# Patient Record
Sex: Female | Born: 1960 | Race: White | Hispanic: No | Smoking: Former smoker
Health system: Southern US, Community
[De-identification: ages and names within clinical notes are randomized; demographics above are authoritative.]

## PROBLEM LIST (undated history)

## (undated) DIAGNOSIS — M1990 Unspecified osteoarthritis, unspecified site: Secondary | ICD-10-CM

## (undated) DIAGNOSIS — T8859XA Other complications of anesthesia, initial encounter: Secondary | ICD-10-CM

## (undated) DIAGNOSIS — T7840XA Allergy, unspecified, initial encounter: Secondary | ICD-10-CM

## (undated) DIAGNOSIS — G43909 Migraine, unspecified, not intractable, without status migrainosus: Secondary | ICD-10-CM

## (undated) DIAGNOSIS — I10 Essential (primary) hypertension: Secondary | ICD-10-CM

## (undated) DIAGNOSIS — K219 Gastro-esophageal reflux disease without esophagitis: Secondary | ICD-10-CM

## (undated) DIAGNOSIS — J449 Chronic obstructive pulmonary disease, unspecified: Secondary | ICD-10-CM

## (undated) HISTORY — DX: Gastro-esophageal reflux disease without esophagitis: K21.9

## (undated) HISTORY — DX: Chronic obstructive pulmonary disease, unspecified: J44.9

## (undated) HISTORY — DX: Unspecified osteoarthritis, unspecified site: M19.90

## (undated) HISTORY — DX: Essential (primary) hypertension: I10

## (undated) HISTORY — PX: UPPER GI ENDOSCOPY: SHX6162

## (undated) HISTORY — DX: Allergy, unspecified, initial encounter: T78.40XA

## (undated) HISTORY — DX: Migraine, unspecified, not intractable, without status migrainosus: G43.909

## (undated) HISTORY — PX: OTHER SURGICAL HISTORY: SHX169

## (undated) MED FILL — Pembrolizumab IV Soln 100 MG/4ML (25 MG/ML): INTRAVENOUS | Qty: 8 | Status: AC

---

## 1984-01-29 HISTORY — PX: OTHER SURGICAL HISTORY: SHX169

## 1984-01-29 HISTORY — PX: TUBAL LIGATION: SHX77

## 1987-01-29 HISTORY — PX: ABDOMINAL HYSTERECTOMY: SHX81

## 2020-01-29 HISTORY — PX: COLONOSCOPY: SHX174

## 2020-10-16 ENCOUNTER — Other Ambulatory Visit: Payer: Self-pay | Admitting: Family

## 2020-10-16 ENCOUNTER — Other Ambulatory Visit (HOSPITAL_COMMUNITY): Payer: Self-pay | Admitting: Family

## 2020-10-16 DIAGNOSIS — F17218 Nicotine dependence, cigarettes, with other nicotine-induced disorders: Secondary | ICD-10-CM

## 2020-10-16 DIAGNOSIS — R918 Other nonspecific abnormal finding of lung field: Secondary | ICD-10-CM

## 2020-11-01 ENCOUNTER — Ambulatory Visit (HOSPITAL_COMMUNITY)
Admission: RE | Admit: 2020-11-01 | Discharge: 2020-11-01 | Disposition: A | Discharge status: Home / Self Care | Payer: Medicare Other | Source: Ambulatory Visit | Attending: Family | Admitting: Family

## 2020-11-01 ENCOUNTER — Other Ambulatory Visit: Payer: Self-pay

## 2020-11-01 DIAGNOSIS — F17218 Nicotine dependence, cigarettes, with other nicotine-induced disorders: Secondary | ICD-10-CM | POA: Insufficient documentation

## 2020-11-01 DIAGNOSIS — R918 Other nonspecific abnormal finding of lung field: Secondary | ICD-10-CM | POA: Diagnosis not present

## 2020-11-01 LAB — GLUCOSE, CAPILLARY: Glucose-Capillary: 103 mg/dL — ABNORMAL HIGH (ref 70–99)

## 2020-11-01 MED ORDER — FLUDEOXYGLUCOSE F - 18 (FDG) INJECTION
10.0000 | Freq: Once | INTRAVENOUS | Status: AC
  Administered 2020-11-01: 6.51 via INTRAVENOUS

## 2020-11-13 ENCOUNTER — Telehealth: Payer: Self-pay | Admitting: Hematology and Oncology

## 2020-11-13 NOTE — Telephone Encounter (Signed)
Scheduled appt per 10/11 referral. Pt is aware of appt date and time.

## 2020-11-20 ENCOUNTER — Other Ambulatory Visit: Payer: Self-pay

## 2020-11-20 DIAGNOSIS — E349 Endocrine disorder, unspecified: Secondary | ICD-10-CM

## 2020-11-20 HISTORY — DX: Endocrine disorder, unspecified: E34.9

## 2020-11-21 ENCOUNTER — Encounter: Payer: Self-pay | Admitting: Hematology and Oncology

## 2020-11-21 ENCOUNTER — Inpatient Hospital Stay: Payer: Medicare Other | Attending: Hematology and Oncology | Admitting: Hematology and Oncology

## 2020-11-21 VITALS — BP 134/84 | HR 78 | Temp 98.8°F | Resp 18 | Ht 61.0 in | Wt 129.0 lb

## 2020-11-21 DIAGNOSIS — J449 Chronic obstructive pulmonary disease, unspecified: Secondary | ICD-10-CM

## 2020-11-21 DIAGNOSIS — F17210 Nicotine dependence, cigarettes, uncomplicated: Secondary | ICD-10-CM | POA: Diagnosis not present

## 2020-11-21 DIAGNOSIS — R911 Solitary pulmonary nodule: Secondary | ICD-10-CM

## 2020-11-21 DIAGNOSIS — R531 Weakness: Secondary | ICD-10-CM | POA: Diagnosis not present

## 2020-11-21 HISTORY — DX: Solitary pulmonary nodule: R91.1

## 2020-11-21 NOTE — Assessment & Plan Note (Addendum)
60 year old female current smoker with new 12.5 mm PET positive lung nodule. She reports increasing weakness which led her to her PCP for evaluation. CT imaging revealed lung nodules that were evaluated by PET scan and one nodule was identified to be hypermetabolic. There is no evidence of metastatic disease. We reviewed her PET in detail. She has a history of COPD as well as smoking 5-8 cigarettes per day. She is agreeable to biopsy and plan for treatment. We will refer her to [facility] for biopsy. She will return to clinic once pathology has been obtained to review results and treatment plan with Medical Oncology.

## 2020-11-21 NOTE — Progress Notes (Signed)
Patient Care Team: Madison Hickman, FNP as PCP - General (Family Medicine)  Clinic Day:  11/22/2020  Referring physician: Madison Hickman, FNP  ASSESSMENT & PLAN:   Assessment & Plan: Nodule of left lung 60 year old female current smoker with new 12.5 mm PET positive lung nodule. She reports increasing weakness which led her to her PCP for evaluation. CT imaging revealed lung nodules that were evaluated by PET scan and one nodule was identified to be hypermetabolic. There is no evidence of metastatic disease. We reviewed her PET in detail. She has a history of COPD as well as smoking 5-8 cigarettes per day. She is agreeable to biopsy and plan for treatment. We will refer her to [place] for biopsy. She will return to clinic once pathology has been obtained to review results and treatment plan with Medical Oncology.   The patient understands the plans discussed today and is in agreement with them.  She knows to contact our office if she develops concerns prior to her next appointment.    [name], NP  [facility] AT [facility] [address] [place] [address] Dept: [phone] Dept Fax: [phone]   Orders Placed This Encounter  Procedures   Ambulatory referral to Pulmonology    Referral Priority:   Routine    Referral Type:   Consultation    Referral Reason:   Specialty Services Required    Requested Specialty:   Pulmonary Disease    Number of Visits Requested:   1   Ambulatory referral to Genetics    Referral Priority:   Routine    Referral Type:   Consultation    Referral Reason:   Specialty Services Required    Number of Visits Requested:   1      CHIEF COMPLAINT:  CC: A 60 year old female with new 41.9 mm hypermetabolic left lung nodule  Current Treatment:  Diagnositics  INTERVAL HISTORY:  Kamani is here today for repeat clinical assessment. She denies fevers or chills. She denies pain. Her  appetite is good. Her weight has been stable.  I have reviewed the past medical history, past surgical history, social history and family history with the patient and they are unchanged from previous note.  ALLERGIES:  is allergic to codeine, demerol [meperidine hcl], gold bond [menthol-zinc oxide], neosporin [bacitracin-polymyxin b], other, and silver.  MEDICATIONS:  Current Outpatient Medications  Medication Sig Dispense Refill   albuterol (VENTOLIN HFA) 108 (90 Base) MCG/ACT inhaler Inhale 2 puffs into the lungs every 4 (four) hours as needed.     amitriptyline (ELAVIL) 10 MG tablet Take 1 tablet by mouth at bedtime.     hydrochlorothiazide (HYDRODIURIL) 25 MG tablet Take 1 tablet by mouth daily.     acetaminophen (TYLENOL) 500 MG tablet Take 2 tablets by mouth every 6 (six) hours as needed.     aspirin 81 MG EC tablet Take 1 tablet by mouth daily.     b complex vitamins capsule Take 1 capsule by mouth daily.     meloxicam (MOBIC) 7.5 MG tablet Take 1 tablet by mouth in the morning and at bedtime.     No current facility-administered medications for this visit.    HISTORY OF PRESENT ILLNESS:   Oncology History   No history exists.      REVIEW OF SYSTEMS:   Constitutional: Denies fevers, chills or abnormal weight loss Eyes: Denies blurriness of vision Ears, nose, mouth, throat, and face:  Denies mucositis or sore throat Respiratory: Denies cough, dyspnea or wheezes Cardiovascular: Denies palpitation, chest discomfort or lower extremity swelling Gastrointestinal:  Denies nausea, heartburn or change in bowel habits Skin: Denies abnormal skin rashes Lymphatics: Denies new lymphadenopathy or easy bruising Neurological:Denies numbness, tingling or new weaknesses Behavioral/Psych: Mood is stable, no new changes  All other systems were reviewed with the patient and are negative.   VITALS:  Blood pressure 134/84, pulse 78, temperature 98.8 F (37.1 C), temperature source Oral,  resp. rate 18, height 5\' 1"  (1.549 m), weight 129 lb (58.5 kg), SpO2 97 %.  Wt Readings from Last 3 Encounters:  11/21/20 129 lb (58.5 kg)  11/01/20 130 lb (59 kg)    Body mass index is 24.37 kg/m.  Performance status (ECOG): 1 - Symptomatic but completely ambulatory  PHYSICAL EXAM:   GENERAL:alert, no distress and comfortable SKIN: skin color, texture, turgor are normal, no rashes or significant lesions EYES: normal, Conjunctiva are pink and non-injected, sclera clear OROPHARYNX:no exudate, no erythema and lips, buccal mucosa, and tongue normal  NECK: supple, thyroid normal size, non-tender, without nodularity LYMPH:  no palpable lymphadenopathy in the cervical, axillary or inguinal LUNGS: clear to auscultation and percussion with normal breathing effort HEART: regular rate & rhythm and no murmurs and no lower extremity edema ABDOMEN:abdomen soft, non-tender and normal bowel sounds Musculoskeletal:no cyanosis of digits and no clubbing  NEURO: alert & oriented x 3 with fluent speech, no focal motor/sensory deficits  LABORATORY DATA:  I have reviewed the data as listed No results found for: NA, K, CL, CO2, GLUCOSE, BUN, CREATININE, CALCIUM, PROT, ALBUMIN, AST, ALT, ALKPHOS, BILITOT, GFRNONAA, GFRAA  No results found for: SPEP, UPEP  No results found for: WBC, NEUTROABS, HGB, HCT, MCV, PLT    Chemistry   No results found for: NA, K, CL, CO2, BUN, CREATININE, GLU No results found for: CALCIUM, ALKPHOS, AST, ALT, BILITOT     RADIOGRAPHIC STUDIES: I have personally reviewed the radiological images as listed and agreed with the findings in the report. NM PET Image Initial (PI) Skull Base To Thigh (F-18 FDG)  Result Date: 11/02/2020 CLINICAL DATA:  Initial treatment strategy for left lower lobe pulmonary nodule on lung cancer screening CT scan. EXAM: NUCLEAR MEDICINE PET SKULL BASE TO THIGH TECHNIQUE: 6.51 mCi F-18 FDG was injected intravenously. Full-ring PET imaging was performed  from the skull base to thigh after the radiotracer. CT data was obtained and used for attenuation correction and anatomic localization. Fasting blood glucose: 103 mg/dl COMPARISON:  Chest CT 10/13/2020 FINDINGS: Mediastinal blood pool activity: SUV max 2.13 Liver activity: SUV max NA NECK: No hypermetabolic lymph nodes in the neck. Incidental CT findings: none CHEST: 12.5 mm lobulated left lower lobe subpleural pulmonary nodule is hypermetabolic with SUV max of 8.11 and consistent with primary lung neoplasm. No enlarged or hypermetabolic mediastinal or hilar lymph nodes to suggest metastatic adenopathy. No supraclavicular or axillary adenopathy. No other pulmonary lesions. Incidental CT findings: Stable underlying emphysematous changes and stable vascular calcifications. ABDOMEN/PELVIS: No abnormal hypermetabolic activity within the liver, pancreas, adrenal glands, or spleen. No hypermetabolic lymph nodes in the abdomen or pelvis. Incidental CT findings: Scattered atherosclerotic calcifications involving the aorta and iliac arteries. SKELETON: No significant bony findings. No evidence of metastatic disease. Incidental CT findings: none IMPRESSION: 1. Solitary 12.5 mm left lower lobe pulmonary nodule is hypermetabolic and consistent with small primary lung neoplasm. 2. No findings for mediastinal or hilar adenopathy or metastatic disease elsewhere. Electronically Signed  By: Marijo Sanes M.D.   On: 11/02/2020 16:53

## 2020-11-22 ENCOUNTER — Encounter: Payer: Self-pay | Admitting: Hematology and Oncology

## 2020-11-22 ENCOUNTER — Telehealth: Payer: Self-pay | Admitting: Genetic Counselor

## 2020-11-22 NOTE — Telephone Encounter (Signed)
Scheduled per sch msg. Called and spoke with patient. Confirmed appt  

## 2020-11-24 ENCOUNTER — Telehealth: Payer: Self-pay | Admitting: Hematology and Oncology

## 2020-11-24 NOTE — Telephone Encounter (Signed)
No LOS Entered 

## 2020-11-28 ENCOUNTER — Encounter: Payer: Self-pay | Admitting: Genetic Counselor

## 2020-11-28 ENCOUNTER — Ambulatory Visit: Payer: Medicare Other

## 2020-11-28 ENCOUNTER — Other Ambulatory Visit: Payer: Self-pay

## 2020-11-28 ENCOUNTER — Inpatient Hospital Stay: Payer: Medicare Other

## 2020-11-28 ENCOUNTER — Inpatient Hospital Stay: Payer: Medicare Other | Attending: Genetic Counselor | Admitting: Genetic Counselor

## 2020-11-28 DIAGNOSIS — Z800 Family history of malignant neoplasm of digestive organs: Secondary | ICD-10-CM

## 2020-11-28 DIAGNOSIS — Z8042 Family history of malignant neoplasm of prostate: Secondary | ICD-10-CM

## 2020-11-28 HISTORY — DX: Family history of malignant neoplasm of digestive organs: Z80.0

## 2020-11-28 HISTORY — DX: Family history of malignant neoplasm of prostate: Z80.42

## 2020-11-28 NOTE — Progress Notes (Signed)
REFERRING PROVIDER: [name], NP [address], [place] [address]  PRIMARY PROVIDER:  Madison Hickman, FNP  PRIMARY REASON FOR VISIT:  1. Family history of pancreatic cancer   2. Family history of prostate cancer   3. Family history of stomach cancer     HISTORY OF PRESENT ILLNESS:   Sydney Hunt, a 60 y.o. female, was seen for a [place] cancer genetics consultation at the request of Dr. Conard Novak due to a family history of cancer.  Sydney Hunt presents to clinic today to discuss the possibility of a hereditary predisposition to cancer, to discuss genetic testing, and to further clarify her future cancer risks, as well as potential cancer risks for family members.   Sydney Hunt is a 60 y.o. female with no personal history of cancer. However, she is in the process of being worked up for possible lung cancer.   CANCER HISTORY:  Oncology History   No history exists.    Past Medical History:  Diagnosis Date   Allergy    Arthritis    COPD (chronic obstructive pulmonary disease) (HCC)    GERD (gastroesophageal reflux disease)    Hypertension    Migraines     Past Surgical History:  Procedure Laterality Date   ABDOMINAL HYSTERECTOMY  1989   unsure of vaginal or abdominal   ovaries removed  [place]    Social History   Socioeconomic History   Marital status: Widowed    Spouse name: Not on file   Number of children: 2   Years of education: Not on file   Highest education level: Not on file  Occupational History   Occupation: unemployed  Tobacco Use   Smoking status: Every Day    Packs/day: 0.25    Years: 58.00    Pack years: 14.50    Types: Cigarettes    Start date: 1963   Smokeless tobacco: Not on file  Vaping Use   Vaping Use: Never used  Substance and Sexual Activity   Alcohol use: Yes    Alcohol/week: 4.0 standard drinks    Types: 1 Glasses of wine, 3 Shots of liquor per week   Drug use: Yes    Frequency: 3.0 times per week     Types: Marijuana    Comment: joints   Sexual activity: Not Currently  Other Topics Concern   Not on file  Social History Narrative   Not on file   Social Determinants of Health   Financial Resource Strain: Not on file  Food Insecurity: Not on file  Transportation Needs: Not on file  Physical Activity: Not on file  Stress: Not on file  Social Connections: Not on file     FAMILY HISTORY:  We obtained a detailed, 4-generation family history.  Significant diagnoses are listed below: Family History  Problem Relation Age of Onset   Pancreatic cancer Maternal Aunt 69   Brain cancer Maternal Uncle    Stomach cancer Maternal Uncle        dx. >50   Prostate cancer Half-Brother    Prostate cancer Half-Brother       Sydney Hunt's maternal half-brother was diagnosed with metastatic prostate cancer in his 16s and died at age 34. She has a second maternal half-brother who was also diagnosed with prostate cancer (possibly stage III) at age 80. Her maternal aunt was diagnosed with pancreatic cancer at age 89. Her maternal uncle was diagnosed with stomach cancer at an unknown  age (>50) and a second maternal uncle was diagnosed with brain cancer at an unknown age. She has 2 maternal aunts who had an unknown type of cancer. All of her maternal aunts/uncles and her mother are deceased.   Sydney Hunt is unaware of previous family history of genetic testing for hereditary cancer risks. There no reported Ashkenazi Jewish ancestry.   GENETIC COUNSELING ASSESSMENT: Sydney Hunt is a 60 y.o. female with a family history of cancer which is somewhat suggestive of a hereditary predisposition to cancer. We, therefore, discussed and recommended the following at today's visit.   DISCUSSION: We discussed that 5 - 10% of cancer is hereditary.  There are many genes that can be associated with hereditary cancer syndromes.  We discussed that testing is beneficial for several reasons, including knowing about other  cancer risks, identifying potential screening and risk-reduction options that may be appropriate, and to understanding if other family members could be at risk for cancer and allowing them to undergo genetic testing.  We reviewed the characteristics, features and inheritance patterns of hereditary cancer syndromes. We also discussed genetic testing, including the appropriate family members to test, the process of testing, insurance coverage and turn-around-time for results. We discussed the implications of a negative, positive, carrier and/or variant of uncertain significant result. We discussed that negative results would be uninformative given that Sydney Hunt does not have a personal history of cancer.   Sydney Hunt was offered a common hereditary cancer panel (47 genes) and an expanded pan-cancer panel (84 genes). Sydney Hunt was informed of the benefits and limitations of each panel, including that expanded pan-cancer panels contain several genes that do not have clear management guidelines at this point in time.  We also discussed that as the number of genes included on a panel increases, the chances of variants of uncertain significance increases.  After considering the benefits and limitations of each gene panel, Sydney Hunt elected to have [facility]'s Multi-Cancer Panel+RNA.   The Multi-Cancer + RNA Panel offered by [facility] includes sequencing and/or deletion/duplication analysis of the following 84 genes:  AIP*, ALK, APC*, ATM*, AXIN2*, BAP1*, BARD1*, BLM*, BMPR1A*, BRCA1*, BRCA2*, BRIP1*, CASR, CDC73*, CDH1*, CDK4, CDKN1B*, CDKN1C*, CDKN2A, CEBPA, CHEK2*, CTNNA1*, DICER1*, DIS3L2*, EGFR, EPCAM, FH*, FLCN*, GATA2*, GPC3, GREM1, HOXB13, HRAS, KIT, MAX*, MEN1*, MET, MITF, MLH1*, MSH2*, MSH3*, MSH6*, MUTYH*, NBN*, NF1*, NF2*, NTHL1*, PALB2*, PDGFRA, PHOX2B, PMS2*, POLD1*, POLE*, POT1*, PRKAR1A*, PTCH1*, PTEN*, RAD50*, RAD51C*, RAD51D*, RB1*, RECQL4, RET, RUNX1*, SDHA*, SDHAF2*, SDHB*, SDHC*, SDHD*, SMAD4*,  SMARCA4*, SMARCB1*, SMARCE1*, STK11*, SUFU*, TERC, TERT, TMEM127*, Tp53*, TSC1*, TSC2*, VHL*, WRN*, and WT1.  RNA analysis is performed for * genes.  Based on Sydney Hunt's family history of cancer, she meets medical criteria for genetic testing. Despite that she meets criteria, she may still have an out of pocket cost. We discussed that if her out of pocket cost for testing is over $100, the laboratory will call and confirm whether she wants to proceed with testing.  If the out of pocket cost of testing is less than $100 she will be billed by the genetic testing laboratory.   We discussed that some people do not want to undergo genetic testing due to fear of genetic discrimination.  A federal law called the Genetic Information Non-Discrimination Act (GINA) of 2008 helps protect individuals against genetic discrimination based on their genetic test results.  It impacts both health insurance and employment.  With health insurance, it protects against increased premiums, being kicked off insurance or being forced to  take a test in order to be insured.  For employment it protects against hiring, firing and promoting decisions based on genetic test results.  GINA does not apply to those in the [employer], those who work for companies with less than 15 employees, and new life insurance or long-term disability insurance policies.  Health status due to a cancer diagnosis is not protected under GINA.  PLAN: After considering the risks, benefits, and limitations, Sydney Hunt provided informed consent to pursue genetic testing and the blood sample was sent to [facility] for analysis of the Multi-Cancer Panel (84 genes). Results should be available within approximately 2-3 weeks' time, at which point they will be disclosed by telephone to Sydney Hunt, as will any additional recommendations warranted by these results. Sydney Hunt will receive a summary of her genetic counseling visit and a copy of her results once  available. This information will also be available in Epic.   Sydney Hunt questions were answered to her satisfaction today. Our contact information was provided should additional questions or concerns arise. Thank you for the referral and allowing [place] to share in the care of your patient.   Lucille Passy, MS, [facility] Genetic Counselor [email or web address] (P) [phone]  The patient was seen for a total of 55 minutes in face-to-face genetic counseling. The patient was seen alone.  Drs. Magrinat, Lindi Adie and/or Burr Medico were available to discuss this case as needed.  _______________________________________________________________________ For Office Staff:  Number of people involved in session: 1 Was an Intern/ student involved with case: yes

## 2020-12-07 ENCOUNTER — Other Ambulatory Visit: Payer: Self-pay

## 2020-12-07 ENCOUNTER — Ambulatory Visit (INDEPENDENT_AMBULATORY_CARE_PROVIDER_SITE_OTHER): Payer: Medicare Other | Admitting: Emergency Medicine

## 2020-12-07 ENCOUNTER — Encounter: Payer: Self-pay | Admitting: Emergency Medicine

## 2020-12-07 ENCOUNTER — Telehealth: Payer: Self-pay | Admitting: Emergency Medicine

## 2020-12-07 DIAGNOSIS — Z720 Tobacco use: Secondary | ICD-10-CM

## 2020-12-07 DIAGNOSIS — R911 Solitary pulmonary nodule: Secondary | ICD-10-CM

## 2020-12-07 HISTORY — DX: Tobacco use: Z72.0

## 2020-12-07 MED ORDER — SPIRIVA RESPIMAT 1.25 MCG/ACT IN AERS: 2.0000 | INHALATION_SPRAY | Freq: Every day | RESPIRATORY_TRACT | 4 refills | Status: DC

## 2020-12-07 MED ORDER — SPIRIVA RESPIMAT 1.25 MCG/ACT IN AERS: 2.0000 | INHALATION_SPRAY | Freq: Every day | RESPIRATORY_TRACT | 0 refills | Status: DC

## 2020-12-07 NOTE — Patient Instructions (Signed)
We reviewed your CT chest and PET scan today.  There is an isolated left-sided pulmonary nodule.  We will perform pulmonary function testing.  Depending on your lung function we will decide whether to consider bronchoscopy with biopsy of your pulmonary nodule versus referral to thoracic surgery to consider surgical removal. We will start Spiriva Respimat, 2 puffs once daily.  If this medication helps your breathing then we will plan to continue it going forward Keep your albuterol available to use 2 puffs when you needed for shortness of breath, chest tightness, wheezing. Follow Dr. Lamonte Sakai next available to review pulmonary function testing.

## 2020-12-07 NOTE — Assessment & Plan Note (Signed)
Hypermetabolic left lower lobe pulmonary nodule consistent with stage I disease.  Depending on the pulmonary function testing I believe she may be a candidate for primary resection.  I will get the PFT now.  If there are concerns about her ability to tolerate surgery then we will arrange for navigational bronchoscopy with biopsy.  I will review her PFT with her and decide which strategy to pursue.

## 2020-12-07 NOTE — Assessment & Plan Note (Signed)
With presumed COPD.  She may have benefited on a trial of respiratory but she had some associated agitation and was unable to continue it.  Cost was also an issue.  I do a trial of Spiriva to see if she can tolerate and whether she benefits.  We will arrange for PFTs as above.  Keep albuterol as needed.  We will need to talk more about smoking cessation going forward.

## 2020-12-07 NOTE — Telephone Encounter (Signed)
ATC pt and confirm appointment at [facility] for PFT? Appt is showing on appt desk for 12/12/20. Nothing further needed at this time.

## 2020-12-07 NOTE — Addendum Note (Signed)
Addended by: Konrad Felix L on: 12/07/2020 03:04 PM   Modules accepted: Orders

## 2020-12-07 NOTE — Progress Notes (Signed)
Subjective:    Patient ID: Sydney Hunt, female    DOB: [birth year], 60 y.o.   MRN: [id number]  HPI 60 year old woman with history of tobacco use (50+ pack years), COPD, allergies, hypertension, GERD.  She went a lung cancer screening CT on 10/13/2020 as below this showed pulmonary nodular disease and prompted PET scan.  She has been seen by oncology and Oval Linsey, is here to discuss the nodular disease, next steps in evaluation. She reports some decreased energy, inability to exert herself. No dyspnea at rest but she does have trouble with exertion, playing with her granddaughter. She has some cough in the am, occasionally productive. She has albuterol, uses about 2-3x a week. She has been tried on [place] before, may have benefited but insurance did not cover it. She also had some tremor/agitation w it.   Lung cancer screening CT chest 10/13/2020 reviewed by me, shows some biapical scarring, scattered emphysema. 11.2 mm left lower lobe pulmonary nodule with some lobulation.  Other smaller nodules largest 5 mm  PET scan 11/01/2020 reviewed by me shows the 12.5 mm lobulated left lower lobe pulmonary nodule is hypermetabolic consistent with primary malignancy.  There are no hypermetabolic mediastinal or hilar lymph nodes, no evidence of any distant disease.   Review of Systems As per HPI  Past Medical History:  Diagnosis Date   Allergy    Arthritis    COPD (chronic obstructive pulmonary disease) (HCC)    GERD (gastroesophageal reflux disease)    Hypertension    Migraines      Family History  Problem Relation Age of Onset   Pancreatic cancer Maternal Aunt 50   Brain cancer Maternal Uncle    Stomach cancer Maternal Uncle        dx. >50   Prostate cancer Half-Brother    Prostate cancer Half-Brother      Social History   Socioeconomic History   Marital status: Widowed    Spouse name: Not on file   Number of children: 2   Years of education: Not on file   Highest education level: Not  on file  Occupational History   Occupation: unemployed  Tobacco Use   Smoking status: Every Day    Packs/day: 0.25    Years: 58.00    Pack years: 14.50    Types: Cigarettes    Start date: 1963   Smokeless tobacco: Not on file   Tobacco comments:    5-10 cigs 12/07/20  Vaping Use   Vaping Use: Never used  Substance and Sexual Activity   Alcohol use: Yes    Alcohol/week: 4.0 standard drinks    Types: 1 Glasses of wine, 3 Shots of liquor per week   Drug use: Yes    Frequency: 3.0 times per week    Types: Marijuana    Comment: joints   Sexual activity: Not Currently  Other Topics Concern   Not on file  Social History Narrative   Not on file   Social Determinants of Health   Financial Resource Strain: Not on file  Food Insecurity: Not on file  Transportation Needs: Not on file  Physical Activity: Not on file  Stress: Not on file  Social Connections: Not on file  Intimate Partner Violence: Not on file    From [place], has lived also in [place] exposure Has worked in [employer], has done cleaning work  Allergies  Allergen Reactions   Codeine Rash   Demerol [Meperidine Hcl] Other (See Comments)    coma  Gold Bond [Menthol-Zinc Oxide] Dermatitis    Skin weeps   Neosporin [Bacitracin-Polymyxin B] Dermatitis    Skin weeps   Other Dermatitis    Stainless steel / skin weeps   Silver Dermatitis    Sterling silver skin weeps     Outpatient Medications Prior to Visit  Medication Sig Dispense Refill   acetaminophen (TYLENOL) 500 MG tablet Take 2 tablets by mouth every 6 (six) hours as needed.     albuterol (VENTOLIN HFA) 108 (90 Base) MCG/ACT inhaler Inhale 2 puffs into the lungs every 4 (four) hours as needed.     amitriptyline (ELAVIL) 10 MG tablet Take 1 tablet by mouth at bedtime.     aspirin 81 MG EC tablet Take 1 tablet by mouth daily.     b complex vitamins capsule Take 1 capsule by mouth daily.     hydrochlorothiazide (HYDRODIURIL) 25 MG tablet Take 1 tablet  by mouth daily.     meloxicam (MOBIC) 7.5 MG tablet Take 1 tablet by mouth in the morning and at bedtime.     No facility-administered medications prior to visit.         Objective:   Physical Exam Vitals:   12/07/20 1355  BP: 104/68  Pulse: 87  SpO2: 95%  Weight: 130 lb 9.6 oz (59.2 kg)  Height: 5\' 1"  (1.549 m)    Gen: Pleasant, well-nourished, in no distress,  normal affect  ENT: No lesions,  mouth clear,  oropharynx clear, no postnasal drip  Neck: No JVD, no stridor  Lungs: No use of accessory muscles, no crackles or wheezing on normal respiration, no wheeze on forced expiration  Cardiovascular: RRR, heart sounds normal, no murmur or gallops, no peripheral edema  Musculoskeletal: No deformities, no cyanosis or clubbing  Neuro: alert, awake, non focal  Skin: Warm, no lesions or rash      Assessment & Plan:  Nodule of left lung Hypermetabolic left lower lobe pulmonary nodule consistent with stage I disease.  Depending on the pulmonary function testing I believe she may be a candidate for primary resection.  I will get the PFT now.  If there are concerns about her ability to tolerate surgery then we will arrange for navigational bronchoscopy with biopsy.  I will review her PFT with her and decide which strategy to pursue.  Tobacco use With presumed COPD.  She may have benefited on a trial of respiratory but she had some associated agitation and was unable to continue it.  Cost was also an issue.  I do a trial of Spiriva to see if she can tolerate and whether she benefits.  We will arrange for PFTs as above.  Keep albuterol as needed.  We will need to talk more about smoking cessation going forward.   Baltazar Apo, MD, PhD 12/07/2020, 2:38 PM [facility] [phone] or if no answer before 7:00PM call [phone] For any issues after 7:00PM please call eLink [phone]

## 2020-12-12 ENCOUNTER — Other Ambulatory Visit: Payer: Self-pay

## 2020-12-12 ENCOUNTER — Ambulatory Visit (HOSPITAL_COMMUNITY)
Admission: RE | Admit: 2020-12-12 | Discharge: 2020-12-12 | Disposition: A | Discharge status: Home / Self Care | Payer: Medicare Other | Source: Ambulatory Visit | Attending: Emergency Medicine | Admitting: Emergency Medicine

## 2020-12-12 DIAGNOSIS — R0600 Dyspnea, unspecified: Secondary | ICD-10-CM | POA: Diagnosis not present

## 2020-12-12 DIAGNOSIS — R911 Solitary pulmonary nodule: Secondary | ICD-10-CM | POA: Diagnosis not present

## 2020-12-12 DIAGNOSIS — F17210 Nicotine dependence, cigarettes, uncomplicated: Secondary | ICD-10-CM | POA: Diagnosis not present

## 2020-12-12 LAB — PULMONARY FUNCTION TEST
DL/VA % pred: 75 %
DL/VA: 3.23 ml/min/mmHg/L
DLCO unc % pred: 67 %
DLCO unc: 12.15 ml/min/mmHg
FEF 25-75 Post: 1.41 L/sec
FEF 25-75 Pre: 1.75 L/sec
FEF2575-%Change-Post: -18 %
FEF2575-%Pred-Post: 67 %
FEF2575-%Pred-Pre: 83 %
FEV1-%Change-Post: -4 %
FEV1-%Pred-Post: 85 %
FEV1-%Pred-Pre: 88 %
FEV1-Post: 1.89 L
FEV1-Pre: 1.98 L
FEV1FVC-%Change-Post: 2 %
FEV1FVC-%Pred-Pre: 98 %
FEV6-%Change-Post: -5 %
FEV6-%Pred-Post: 86 %
FEV6-%Pred-Pre: 91 %
FEV6-Post: 2.41 L
FEV6-Pre: 2.54 L
FEV6FVC-%Change-Post: 0 %
FEV6FVC-%Pred-Post: 103 %
FEV6FVC-%Pred-Pre: 103 %
FVC-%Change-Post: -6 %
FVC-%Pred-Post: 83 %
FVC-%Pred-Pre: 89 %
FVC-Post: 2.41 L
FVC-Pre: 2.58 L
Post FEV1/FVC ratio: 78 %
Post FEV6/FVC ratio: 100 %
Pre FEV1/FVC ratio: 77 %
Pre FEV6/FVC Ratio: 99 %
RV % pred: 105 %
RV: 1.99 L
TLC % pred: 103 %
TLC: 4.76 L

## 2020-12-12 MED ORDER — ALBUTEROL SULFATE (2.5 MG/3ML) 0.083% IN NEBU
2.5000 mg | INHALATION_SOLUTION | Freq: Once | RESPIRATORY_TRACT | Status: AC
Start: 2020-12-12 — End: 2020-12-12
  Administered 2020-12-12: 2.5 mg via RESPIRATORY_TRACT

## 2020-12-15 ENCOUNTER — Encounter: Payer: Self-pay | Admitting: Emergency Medicine

## 2020-12-15 ENCOUNTER — Ambulatory Visit (INDEPENDENT_AMBULATORY_CARE_PROVIDER_SITE_OTHER): Payer: Medicare Other | Admitting: Emergency Medicine

## 2020-12-15 ENCOUNTER — Other Ambulatory Visit: Payer: Self-pay

## 2020-12-15 DIAGNOSIS — J449 Chronic obstructive pulmonary disease, unspecified: Secondary | ICD-10-CM

## 2020-12-15 DIAGNOSIS — R911 Solitary pulmonary nodule: Secondary | ICD-10-CM | POA: Diagnosis not present

## 2020-12-15 DIAGNOSIS — Z720 Tobacco use: Secondary | ICD-10-CM | POA: Diagnosis not present

## 2020-12-15 HISTORY — DX: Chronic obstructive pulmonary disease, unspecified: J44.9

## 2020-12-15 NOTE — Assessment & Plan Note (Signed)
Reviewed PET scan and pulmonary function testing with the patient and her daughter who joined [place] by phone.  Based on the PET appearance, PFT I think she is a good candidate for referral to thoracic surgery for possible primary resection.  We will make the referral today.  She agrees.

## 2020-12-15 NOTE — Addendum Note (Signed)
Addended by: Gavin Potters R on: 12/15/2020 11:29 AM   Modules accepted: Orders

## 2020-12-15 NOTE — Patient Instructions (Addendum)
We will refer you to cardiothoracic surgery to discuss possible surgical removal of your left lower lobe pulmonary nodule. We reviewed your pulmonary function testing today.  These look good without any clear evidence for significant obstructive lung disease. Continue your Spiriva 2 puffs once daily. Keep albuterol available to use 2 puffs if needed for shortness of breath, chest tightness, wheezing. Work hard on decreasing your smoking.  Our goal is for you to stop altogether. Follow with Dr Lamonte Sakai in 3 months or sooner if you have any problems.

## 2020-12-15 NOTE — Progress Notes (Signed)
Subjective:    Patient ID: Sydney Hunt, female    DOB: 1960-11-27, 60 y.o.   MRN: [id number]  HPI 60 year old woman with history of tobacco use (50+ pack years), COPD, allergies, hypertension, GERD.  She went a lung cancer screening CT on 10/13/2020 as below this showed pulmonary nodular disease and prompted PET scan.  She has been seen by oncology and Oval Linsey, is here to discuss the nodular disease, next steps in evaluation. She reports some decreased energy, inability to exert herself. No dyspnea at rest but she does have trouble with exertion, playing with her granddaughter. She has some cough in the am, occasionally productive. She has albuterol, uses about 2-3x a week. She has been tried on [place] before, may have benefited but insurance did not cover it. She also had some tremor/agitation w it.   Lung cancer screening CT chest 10/13/2020 reviewed by me, shows some biapical scarring, scattered emphysema. 11.2 mm left lower lobe pulmonary nodule with some lobulation.  Other smaller nodules largest 5 mm  PET scan 11/01/2020 reviewed by me shows the 12.5 mm lobulated left lower lobe pulmonary nodule is hypermetabolic consistent with primary malignancy.  There are no hypermetabolic mediastinal or hilar lymph nodes, no evidence of any distant disease.  ROV 12/15/20 --this is a follow-up visit for 60 year old woman with a history of tobacco use, COPD, allergic rhinitis, hypertension and GERD.  I saw her a week ago for an abnormal CT scan of the chest done for lung cancer screening that showed an 11 mm left lower lobe pulmonary nodule with some lobulation.  Hypermetabolic on PET scan that was done 11/01/2020.  She underwent pulmonary function testing on 12/12/2020 and is here to review these.  She tells me today that she has had surgery before but that it was hard on her body and that she had bad scarring. No clear contraindication - her allergy list mentions silver and stainless steel but not clear to me  what the sensitivity is.   PFT 12/12/2020 reviewed by me, shows normal airflows without a bronchodilator response, normal lung volumes and a slightly decreased diffusion capacity that does not fully correct when adjusted for her alveolar volume.   Review of Systems As per HPI  Past Medical History:  Diagnosis Date   Allergy    Arthritis    COPD (chronic obstructive pulmonary disease) (HCC)    GERD (gastroesophageal reflux disease)    Hypertension    Migraines      Family History  Problem Relation Age of Onset   Pancreatic cancer Maternal Aunt 50   Brain cancer Maternal Uncle    Stomach cancer Maternal Uncle        dx. >50   Prostate cancer Half-Brother    Prostate cancer Half-Brother      Social History   Socioeconomic History   Marital status: Widowed    Spouse name: Not on file   Number of children: 2   Years of education: Not on file   Highest education level: Not on file  Occupational History   Occupation: unemployed  Tobacco Use   Smoking status: Every Day    Packs/day: 0.25    Years: 58.00    Pack years: 14.50    Types: Cigarettes    Start date: 1963   Smokeless tobacco: Never   Tobacco comments:    10 cigarettes smoked daily [facility]J 12/15/20  Vaping Use   Vaping Use: Never used  Substance and Sexual Activity   Alcohol use: Yes  Alcohol/week: 4.0 standard drinks    Types: 1 Glasses of wine, 3 Shots of liquor per week   Drug use: Yes    Frequency: 3.0 times per week    Types: Marijuana    Comment: joints   Sexual activity: Not Currently  Other Topics Concern   Not on file  Social History Narrative   Not on file   Social Determinants of Health   Financial Resource Strain: Not on file  Food Insecurity: Not on file  Transportation Needs: Not on file  Physical Activity: Not on file  Stress: Not on file  Social Connections: Not on file  Intimate Partner Violence: Not on file    From [place], has lived also in [place] exposure Has worked in  [employer], has done cleaning work  Allergies  Allergen Reactions   Codeine Rash   Demerol [Meperidine Hcl] Other (See Comments)    coma   Gold Bond [Menthol-Zinc Oxide] Dermatitis    Skin weeps   Neosporin [Bacitracin-Polymyxin B] Dermatitis    Skin weeps   Other Dermatitis    Stainless steel / skin weeps   Silver Dermatitis    Sterling silver skin weeps     Outpatient Medications Prior to Visit  Medication Sig Dispense Refill   acetaminophen (TYLENOL) 500 MG tablet Take 2 tablets by mouth every 6 (six) hours as needed.     albuterol (VENTOLIN HFA) 108 (90 Base) MCG/ACT inhaler Inhale 2 puffs into the lungs every 4 (four) hours as needed.     amitriptyline (ELAVIL) 10 MG tablet Take 1 tablet by mouth at bedtime.     aspirin 81 MG EC tablet Take 1 tablet by mouth daily.     b complex vitamins capsule Take 1 capsule by mouth daily.     hydrochlorothiazide (HYDRODIURIL) 25 MG tablet Take 1 tablet by mouth daily.     meloxicam (MOBIC) 7.5 MG tablet Take 1 tablet by mouth in the morning and at bedtime.     Tiotropium Bromide Monohydrate (SPIRIVA RESPIMAT) 1.25 MCG/ACT AERS Inhale 2 puffs into the lungs daily. 4 g 0   No facility-administered medications prior to visit.         Objective:   Physical Exam Vitals:   12/15/20 0933  BP: 132/80  Pulse: 80  Temp: 98.7 F (37.1 C)  TempSrc: Oral  SpO2: 97%  Weight: 132 lb 6.4 oz (60.1 kg)  Height: 5\' 1"  (1.549 m)    Gen: Pleasant, well-nourished, in no distress,  normal affect  ENT: No lesions,  mouth clear,  oropharynx clear, no postnasal drip  Neck: No JVD, no stridor  Lungs: No use of accessory muscles, no crackles or wheezing on normal respiration, no wheeze on forced expiration  Cardiovascular: RRR, heart sounds normal, no murmur or gallops, no peripheral edema  Musculoskeletal: No deformities, no cyanosis or clubbing  Neuro: alert, awake, non focal  Skin: Warm, no lesions or rash      Assessment & Plan:   Tobacco use Discussed cessation with her today.  She has cut down, needs to quit altogether.  She is working on this.  Nodule of left lung Reviewed PET scan and pulmonary function testing with the patient and her daughter who joined [place] by phone.  Based on the PET appearance, PFT I think she is a good candidate for referral to thoracic surgery for possible primary resection.  We will make the referral today.  She agrees.  COPD, mild ([place]) Very functional, minimal limitations.  She does have some cough and sputum production.  She tolerates Spiriva.  Her pulmonary function testing are reassuring without any evidence for significant obstruction.  Mild diffusion defect.  Good candidate for referral to T CTS.  We will continue her Spiriva, albuterol as needed.   Baltazar Apo, MD, PhD 12/15/2020, 9:55 AM [facility] [phone] or if no answer before 7:00PM call [phone] For any issues after 7:00PM please call eLink [phone]

## 2020-12-15 NOTE — Assessment & Plan Note (Signed)
Discussed cessation with her today.  She has cut down, needs to quit altogether.  She is working on this.

## 2020-12-15 NOTE — Assessment & Plan Note (Signed)
Very functional, minimal limitations.  She does have some cough and sputum production.  She tolerates Spiriva.  Her pulmonary function testing are reassuring without any evidence for significant obstruction.  Mild diffusion defect.  Good candidate for referral to T CTS.  We will continue her Spiriva, albuterol as needed.

## 2020-12-18 ENCOUNTER — Telehealth: Payer: Self-pay | Admitting: Genetic Counselor

## 2020-12-18 ENCOUNTER — Encounter: Payer: Self-pay | Admitting: Genetic Counselor

## 2020-12-18 DIAGNOSIS — Z1379 Encounter for other screening for genetic and chromosomal anomalies: Secondary | ICD-10-CM

## 2020-12-18 HISTORY — DX: Encounter for other screening for genetic and chromosomal anomalies: Z13.79

## 2020-12-18 NOTE — Telephone Encounter (Signed)
I contacted [name] to discuss her genetic testing results. No pathogenic variants were identified in the 84 genes analyzed.   The test report has been scanned into EPIC and is located under the Molecular Pathology section of the Results Review tab.  A portion of the result report is included below for reference. Detailed clinic note to follow.  Sydney Passy, MS, [facility] Genetic Counselor [email or web address] (P) [phone]

## 2020-12-20 ENCOUNTER — Institutional Professional Consult (permissible substitution): Payer: Medicare Other | Admitting: Pulmonary Disease

## 2020-12-22 ENCOUNTER — Ambulatory Visit: Payer: Self-pay | Admitting: Genetic Counselor

## 2020-12-22 NOTE — Progress Notes (Signed)
HPI:   Ms. Danser was previously seen in the [facility] clinic due to a family history of cancer and concerns regarding a hereditary predisposition to cancer. Please refer to our prior cancer genetics clinic note for more information regarding our discussion, assessment and recommendations, at the time. Ms. Lei recent genetic test results were disclosed to her, as were recommendations warranted by these results. These results and recommendations are discussed in more detail below.  CANCER HISTORY:  Oncology History   No history exists.    FAMILY HISTORY:  We obtained a detailed, 4-generation family history.  Significant diagnoses are listed below:      Family History  Problem Relation Age of Onset   Pancreatic cancer Maternal Aunt 43   Brain cancer Maternal Uncle     Stomach cancer Maternal Uncle          dx. >50   Prostate cancer Half-Brother     Prostate cancer Half-Brother           Ms. Jann's maternal half-brother was diagnosed with metastatic prostate cancer in his 76s and died at age 87. She has a second maternal half-brother who was also diagnosed with prostate cancer (possibly stage III) at age 17. Her maternal aunt was diagnosed with pancreatic cancer at age 50. Her maternal uncle was diagnosed with stomach cancer at an unknown age (>50) and a second maternal uncle was diagnosed with brain cancer at an unknown age. She has 2 maternal aunts who had an unknown type of cancer. All of her maternal aunts/uncles and her mother are deceased.    Ms. Gwynn is unaware of previous family history of genetic testing for hereditary cancer risks. There no reported Ashkenazi Jewish ancestry.   GENETIC TEST RESULTS:  The [facility] Multi-Cancer Panel found no pathogenic mutations.   The Multi-Cancer + RNA Panel offered by [facility] includes sequencing and/or deletion/duplication analysis of the following 84 genes:  AIP*, ALK, APC*, ATM*, AXIN2*, BAP1*, BARD1*, BLM*, BMPR1A*,  BRCA1*, BRCA2*, BRIP1*, CASR, CDC73*, CDH1*, CDK4, CDKN1B*, CDKN1C*, CDKN2A, CEBPA, CHEK2*, CTNNA1*, DICER1*, DIS3L2*, EGFR, EPCAM, FH*, FLCN*, GATA2*, GPC3, GREM1, HOXB13, HRAS, KIT, MAX*, MEN1*, MET, MITF, MLH1*, MSH2*, MSH3*, MSH6*, MUTYH*, NBN*, NF1*, NF2*, NTHL1*, PALB2*, PDGFRA, PHOX2B, PMS2*, POLD1*, POLE*, POT1*, PRKAR1A*, PTCH1*, PTEN*, RAD50*, RAD51C*, RAD51D*, RB1*, RECQL4, RET, RUNX1*, SDHA*, SDHAF2*, SDHB*, SDHC*, SDHD*, SMAD4*, SMARCA4*, SMARCB1*, SMARCE1*, STK11*, SUFU*, TERC, TERT, TMEM127*, Tp53*, TSC1*, TSC2*, VHL*, WRN*, and WT1.  RNA analysis is performed for * genes.   The test report has been scanned into EPIC and is located under the Molecular Pathology section of the Results Review tab.  A portion of the result report is included below for reference. Genetic testing reported out on 12/12/2020.     Even though a pathogenic variant was not identified, possible explanations for the cancer in the family may include: There may be no hereditary risk for cancer in the family. The cancers in her family may be due to other genetic or environmental factors. There may be a gene mutation in one of these genes that current testing methods cannot detect, but that chance is small. There could be another gene that has not yet been discovered, or that we have not yet tested, that is responsible for the cancer diagnoses in the family.  It is also possible there is a hereditary cause for the cancer in the family that Ms. Monda did not inherit.  Therefore, it is important to remain in touch with cancer genetics in the future  so that we can continue to offer Ms. Fill the most up to date genetic testing.   ADDITIONAL GENETIC TESTING:  We discussed with Ms. Lawler that her genetic testing was fairly extensive.  If there are genes identified to increase cancer risk that can be analyzed in the future, we would be happy to discuss and coordinate this testing at that time.    CANCER SCREENING  RECOMMENDATIONS:  Ms. Stingley test result is considered negative (normal). This means that we have not identified a hereditary cause for her family history of cancer at this time.   An individual's cancer risk and medical management are not determined by genetic test results alone. Overall cancer risk assessment incorporates additional factors, including personal medical history, family history, and any available genetic information that may result in a personalized plan for cancer prevention and surveillance. Therefore, it is recommended she continue to follow the cancer management and screening guidelines provided by her primary healthcare provider.  RECOMMENDATIONS FOR FAMILY MEMBERS:   Since she did not inherit a mutation in a cancer predisposition gene included on this panel, her children could not have inherited a mutation from her in one of these genes. Other members of the family may still carry a pathogenic variant in one of these genes that Ms. Balon did not inherit. Based on the family history, we recommend individuals with a personal history of cancer consider genetic counseling and testing.   FOLLOW-UP:  Cancer genetics is a rapidly advancing field and it is possible that new genetic tests will be appropriate for her and/or her family members in the future. We encouraged her to remain in contact with cancer genetics on an annual basis so we can update her personal and family histories and let her know of advances in cancer genetics that may benefit this family.   Our contact number was provided. Ms. Fill questions were answered to her satisfaction, and she knows she is welcome to call us at anytime with additional questions or concerns.   Lucille Passy, MS, [facility] Genetic Counselor [email or web address] (P) [phone]

## 2020-12-28 ENCOUNTER — Telehealth: Payer: Self-pay | Admitting: Hematology and Oncology

## 2020-12-28 NOTE — Telephone Encounter (Signed)
Patient called requesting an Appt w/[name] to discuss Dr Agustina Caroli recommendations.  Ok per [employer].  Patient scheduled for 12/6 Follow Up 9:30 am

## 2021-01-01 NOTE — Progress Notes (Signed)
[address]       ,[place] [address]             [phone]                    Deloma Clavin [facility] Record #[id number] Date of Birth: [birth year]  Referring: Collene Gobble, MD Primary Care: Madison Hickman, FNP Primary Cardiologist: None  Chief Complaint:    Chief Complaint  Patient presents with   Lung Lesion    Surgical con    History of Present Illness:    Sydney Hunt 60 y.o. female referred by Dr. Lamonte Sakai for surgical evaluation of a 12 mm left lower lobe pulmonary nodule.  She underwent a PET/CT which showed increased uptake with an SUV of 5.6.  She has a significant smoking history and this nodule was found in the lung cancer screening program.  She continues to smoke and states that her smoking has gone up since finding this nodule.  She states that she has no plans to quit smoking.      Smoking Hx: Continues to smoke a pack a day.   Zubrod Score: At the time of surgery this patient's most appropriate activity status/level should be described as: [x]     0    Normal activity, no symptoms []     1    Restricted in physical strenuous activity but ambulatory, able to do out light work []     2    Ambulatory and capable of self care, unable to do work activities, up and about               >50 % of waking hours                              []     3    Only limited self care, in bed greater than 50% of waking hours []     4    Completely disabled, no self care, confined to bed or chair []     5    Moribund   Past Medical History:  Diagnosis Date   Allergy    Arthritis    COPD (chronic obstructive pulmonary disease) ([place])    GERD (gastroesophageal reflux disease)    Hypertension    Migraines     Past Surgical History:  Procedure Laterality Date   ABDOMINAL HYSTERECTOMY  1989   unsure of vaginal or abdominal   ovaries removed  [place]    Family History  Problem Relation Age of Onset   Pancreatic cancer Maternal  Aunt 50   Brain cancer Maternal Uncle    Stomach cancer Maternal Uncle        dx. >50   Prostate cancer Half-Brother    Prostate cancer Half-Brother      Social History   Tobacco Use  Smoking Status Every Day   Packs/day: 0.25   Years: 58.00   Pack years: 14.50   Types: Cigarettes   Start date: 1963  Smokeless Tobacco Never  Tobacco Comments   10 cigarettes smoked daily [facility] 12/15/20    Social History   Substance and Sexual Activity  Alcohol Use Yes   Alcohol/week: 4.0 standard drinks   Types: 1 Glasses of wine, 3 Shots of liquor per week     Allergies  Allergen Reactions   Codeine Rash   Demerol [Meperidine  Hcl] Other (See Comments)    coma   Gold Bond [Menthol-Zinc Oxide] Dermatitis    Skin weeps   Neosporin [Bacitracin-Polymyxin B] Dermatitis    Skin weeps   Other Dermatitis    Stainless steel / skin weeps   Silver Dermatitis    Sterling silver skin weeps    Current Outpatient Medications  Medication Sig Dispense Refill   acetaminophen (TYLENOL) 500 MG tablet Take 2 tablets by mouth every 6 (six) hours as needed.     albuterol (VENTOLIN HFA) 108 (90 Base) MCG/ACT inhaler Inhale 2 puffs into the lungs every 4 (four) hours as needed.     amitriptyline (ELAVIL) 10 MG tablet Take 1 tablet by mouth at bedtime.     aspirin 81 MG EC tablet Take 1 tablet by mouth daily.     b complex vitamins capsule Take 1 capsule by mouth daily.     hydrochlorothiazide (HYDRODIURIL) 25 MG tablet Take 1 tablet by mouth daily.     meloxicam (MOBIC) 7.5 MG tablet Take 1 tablet by mouth in the morning and at bedtime.     Tiotropium Bromide Monohydrate (SPIRIVA RESPIMAT) 1.25 MCG/ACT AERS Inhale 2 puffs into the lungs daily. 4 g 0   No current facility-administered medications for this visit.    Review of Systems  Constitutional: Negative.   Respiratory:  Positive for cough and shortness of breath.   Cardiovascular: Negative.   Neurological: Negative.     PHYSICAL  EXAMINATION: BP (!) 143/86 (BP Location: Left Arm, Patient Position: Sitting)   Pulse 81   Resp 20   Ht 5\' 1"  (1.549 m)   Wt 129 lb (58.5 kg)   LMP  (LMP Unknown)   SpO2 97% Comment: RA  BMI 24.37 kg/m  Physical Exam Constitutional:      General: She is not in acute distress.    Appearance: Normal appearance. She is not ill-appearing.  Cardiovascular:     Rate and Rhythm: Normal rate.  Pulmonary:     Effort: Pulmonary effort is normal. No respiratory distress.  Musculoskeletal:        General: Normal range of motion.     Cervical back: Normal range of motion.  Neurological:     General: No focal deficit present.     Mental Status: She is alert and oriented to person, place, and time.    Diagnostic Studies & Laboratory data:     Recent Radiology Findings:   No results found.     I have independently reviewed the above radiology studies  and reviewed the findings with the patient.   Recent Lab Findings: No results found for: WBC, HGB, HCT, PLT, GLUCOSE, CHOL, TRIG, HDL, LDLDIRECT, LDLCALC, ALT, AST, NA, K, CL, CREATININE, BUN, CO2, TSH, INR, GLUF, HGBA1C   PFTs:  - FVC: 89% - FEV1: 88% -DLCO: 67%  Problem List: 1.2 cm left lower lobe pulmonary nodule with avidity. Currently smoking  Assessment / Plan:   60 year old female with 1.2 cm left lobe pulmonary nodule with avidity concerning for primary lung cancer.  She continues to smoke and has no plans to quit..  I personally reviewed her cross-sectional imaging and the nodule is quite peripheral.  We discussed several options for diagnosing and treating this.  She would be an ideal candidate for a simple anesthetic event however given the fact that she does not want to stop smoking, surgery presents excessive risk for postoperative respiratory complications.  She would like to consider this a little bit more with  her family given the fact that she has been unable to quit smoking on multiple occasions in the past.  If she  is unsuccessful then I do think that SBRT would be a good option given the location of this nodule.  In the meantime I think that she can proceed with a navigational bronchoscopy with Dr. Lamonte Sakai for tissue diagnosis.  I  spent 20 minutes with  the patient face to face in counseling and coordination of care.    Lajuana Matte 01/05/2021 9:53 AM

## 2021-01-01 NOTE — Progress Notes (Addendum)
Patient Care Team: Madison Hickman, FNP as PCP - General (Family Medicine)  Clinic Day:  01/02/2021  Referring physician: Madison Hickman, FNP  ASSESSMENT & PLAN:   Assessment & Plan: Nodule of left lung She has seen pulmonology for evaluation and was found to be a candidate for resection. She will meet with the cardiothoracic surgeon Thursday and discuss the final treatment plan. We discussed the plan moving forward would depend upon surgical findings and final pathology. She will return to clinic after surgery with final pathology.   Tobacco use She has a history of delayed incision healing in the past with her hysterectomy. We discussed that continued smoking can impact this surgery as well and advised her to try to cut back on smoking. She has tried patches and gum in the past without success. Chantix was beginning to work, however, she developed sleep disturbances with hallucinations.     The patient understands the plans discussed today and is in agreement with them.  She knows to contact our office if she develops concerns prior to her next appointment.    [name], NP  [facility] [address] [place] [address] Dept: [phone] Dept Fax: [phone]   No orders of the defined types were placed in this encounter.     CHIEF COMPLAINT:  CC: A 60 year old female with history of PET positive left lung nodule here for treatment planning.  Current Treatment:  Diagnostics  INTERVAL HISTORY:  Sydney Hunt is here today for repeat clinical assessment. She denies fevers or chills. She denies pain. Her appetite is good. Her weight has been stable.  I have reviewed the past medical history, past surgical history, social history and family history with the patient and they are unchanged from previous note.  ALLERGIES:  is allergic to codeine, demerol [meperidine hcl], gold bond [menthol-zinc oxide],  neosporin [bacitracin-polymyxin b], other, and silver.  MEDICATIONS:  Current Outpatient Medications  Medication Sig Dispense Refill   acetaminophen (TYLENOL) 500 MG tablet Take 2 tablets by mouth every 6 (six) hours as needed.     albuterol (VENTOLIN HFA) 108 (90 Base) MCG/ACT inhaler Inhale 2 puffs into the lungs every 4 (four) hours as needed.     amitriptyline (ELAVIL) 10 MG tablet Take 1 tablet by mouth at bedtime.     aspirin 81 MG EC tablet Take 1 tablet by mouth daily.     b complex vitamins capsule Take 1 capsule by mouth daily.     hydrochlorothiazide (HYDRODIURIL) 25 MG tablet Take 1 tablet by mouth daily.     meloxicam (MOBIC) 7.5 MG tablet Take 1 tablet by mouth in the morning and at bedtime.     Tiotropium Bromide Monohydrate (SPIRIVA RESPIMAT) 1.25 MCG/ACT AERS Inhale 2 puffs into the lungs daily. 4 g 0   No current facility-administered medications for this visit.    HISTORY OF PRESENT ILLNESS:   Oncology History   No history exists.      REVIEW OF SYSTEMS:   Constitutional: Denies fevers, chills or abnormal weight loss Eyes: Denies blurriness of vision Ears, nose, mouth, throat, and face: Denies mucositis or sore throat Respiratory: Denies cough, dyspnea or wheezes Cardiovascular: Denies palpitation, chest discomfort or lower extremity swelling Gastrointestinal:  Denies nausea, heartburn or change in bowel habits Skin: Denies abnormal skin rashes Lymphatics: Denies new lymphadenopathy or easy bruising Neurological:Denies numbness, tingling or new weaknesses Behavioral/Psych: Mood is stable, no new changes  All  other systems were reviewed with the patient and are negative.   VITALS:  Blood pressure 132/78, pulse 89, temperature 98.4 F (36.9 C), resp. rate 20, height 5\' 1"  (1.549 m), weight 129 lb 8 oz (58.7 kg), SpO2 93 %.  Wt Readings from Last 3 Encounters:  01/02/21 129 lb 8 oz (58.7 kg)  12/15/20 132 lb 6.4 oz (60.1 kg)  12/07/20 130 lb 9.6 oz (59.2  kg)    Body mass index is 24.47 kg/m.  Performance status (ECOG): 1 - Symptomatic but completely ambulatory  PHYSICAL EXAM:   GENERAL:alert, no distress and comfortable SKIN: skin color, texture, turgor are normal, no rashes or significant lesions EYES: normal, Conjunctiva are pink and non-injected, sclera clear OROPHARYNX:no exudate, no erythema and lips, buccal mucosa, and tongue normal  NECK: supple, thyroid normal size, non-tender, without nodularity LYMPH:  no palpable lymphadenopathy in the cervical, axillary or inguinal LUNGS: clear to auscultation and percussion with normal breathing effort HEART: regular rate & rhythm and no murmurs and no lower extremity edema ABDOMEN:abdomen soft, non-tender and normal bowel sounds Musculoskeletal:no cyanosis of digits and no clubbing  NEURO: alert & oriented x 3 with fluent speech, no focal motor/sensory deficits  LABORATORY DATA:  I have reviewed the data as listed No results found for: NA, K, CL, CO2, GLUCOSE, BUN, CREATININE, CALCIUM, PROT, ALBUMIN, AST, ALT, ALKPHOS, BILITOT, GFRNONAA, GFRAA  No results found for: SPEP, UPEP  No results found for: WBC, NEUTROABS, HGB, HCT, MCV, PLT    Chemistry   No results found for: NA, K, CL, CO2, BUN, CREATININE, GLU No results found for: CALCIUM, ALKPHOS, AST, ALT, BILITOT     RADIOGRAPHIC STUDIES: I have personally reviewed the radiological images as listed and agreed with the findings in the report. No results found.

## 2021-01-02 ENCOUNTER — Telehealth: Payer: Self-pay | Admitting: Hematology and Oncology

## 2021-01-02 ENCOUNTER — Inpatient Hospital Stay: Payer: Medicare Other | Attending: Genetic Counselor | Admitting: Hematology and Oncology

## 2021-01-02 ENCOUNTER — Encounter: Payer: Self-pay | Admitting: Hematology and Oncology

## 2021-01-02 DIAGNOSIS — R911 Solitary pulmonary nodule: Secondary | ICD-10-CM | POA: Diagnosis not present

## 2021-01-02 DIAGNOSIS — Z720 Tobacco use: Secondary | ICD-10-CM | POA: Diagnosis not present

## 2021-01-02 NOTE — Assessment & Plan Note (Signed)
She has seen pulmonology for evaluation and was found to be a candidate for resection. She will meet with the cardiothoracic surgeon Thursday and discuss the final treatment plan. We discussed the plan moving forward would depend upon surgical findings and final pathology. She will return to clinic after surgery with final pathology.

## 2021-01-02 NOTE — Telephone Encounter (Signed)
Per 12/6 LOS, patient scheduled for Feb 2023 Appt's.  Patient entered Appt's in Phone.  She does have MyChart

## 2021-01-02 NOTE — Assessment & Plan Note (Signed)
She has a history of delayed incision healing in the past with her hysterectomy. We discussed that continued smoking can impact this surgery as well and advised her to try to cut back on smoking. She has tried patches and gum in the past without success. Chantix was beginning to work, however, she developed sleep disturbances with hallucinations.

## 2021-01-05 ENCOUNTER — Telehealth: Payer: Self-pay | Admitting: Emergency Medicine

## 2021-01-05 ENCOUNTER — Institutional Professional Consult (permissible substitution) (INDEPENDENT_AMBULATORY_CARE_PROVIDER_SITE_OTHER): Payer: Medicare Other | Admitting: Thoracic Surgery (Cardiothoracic Vascular Surgery)

## 2021-01-05 ENCOUNTER — Other Ambulatory Visit: Payer: Self-pay

## 2021-01-05 VITALS — BP 143/86 | HR 81 | Resp 20 | Ht 61.0 in | Wt 129.0 lb

## 2021-01-05 DIAGNOSIS — R911 Solitary pulmonary nodule: Secondary | ICD-10-CM

## 2021-01-05 NOTE — Telephone Encounter (Signed)
Spoke with the patient today.  She met with Dr. Kipp Brood.  There is concerned that she will not be able to stop smoking prior to thoracic surgery.  Based on that we have decided to pursue navigational bronchoscopy.  I will try to get this arranged for 01/15/2021 because she will need a repeat super D CT.  Her original CT was done at [place].

## 2021-01-08 ENCOUNTER — Telehealth: Payer: Self-pay | Admitting: Emergency Medicine

## 2021-01-08 NOTE — Telephone Encounter (Signed)
I have scheduled pt for 12/19 at 11:00 at [facility].  CT scheduled for tomorrow (12/13) at [facility].  I spoke to Davis and she is going to have disc sent to [facility].  Pt would need covid test on 12/16 and I couldn't get CT for that date and she lives out of town so going to get her to get tested prior to procedure on 12/19.  Tried to call pt & vm not set up.  Called brother Marjory Lies (on [place]) and he is going to try to contact her.

## 2021-01-08 NOTE — Telephone Encounter (Signed)
Spoke to pt & gave her appt info.  Sent staff message to Larene Beach to make her aware pt will be getting covid test prior to procedure on 12/19

## 2021-01-09 ENCOUNTER — Other Ambulatory Visit: Payer: Self-pay

## 2021-01-09 ENCOUNTER — Ambulatory Visit (HOSPITAL_COMMUNITY)
Admission: RE | Admit: 2021-01-09 | Discharge: 2021-01-09 | Disposition: A | Discharge status: Home / Self Care | Payer: Medicare Other | Source: Ambulatory Visit | Attending: Emergency Medicine | Admitting: Emergency Medicine

## 2021-01-09 ENCOUNTER — Encounter (HOSPITAL_COMMUNITY): Payer: Self-pay

## 2021-01-09 DIAGNOSIS — R911 Solitary pulmonary nodule: Secondary | ICD-10-CM

## 2021-01-12 ENCOUNTER — Encounter (HOSPITAL_COMMUNITY): Payer: Self-pay | Admitting: Emergency Medicine

## 2021-01-12 NOTE — Progress Notes (Signed)
DUE TO COVID-19 ONLY ONE VISITOR IS ALLOWED TO COME WITH YOU AND STAY IN THE WAITING ROOM ONLY DURING PRE OP AND PROCEDURE DAY OF SURGERY.   PCP - [facility] Cardiologist - n/a Oncology - Dayton Scrape, NP  CT Chest x-ray - 01/09/21 EKG - n/a Stress Test - n/a ECHO - n/a Cardiac Cath - n/a  ICD Pacemaker/Loop - n/a  Sleep Study -  n/a CPAP - none  Aspirin Instructions: Follow your surgeon's instructions on when to stop aspirin prior to surgery,  If no instructions were given by your surgeon then you will need to call the office for those instructions.  STOP now taking any Aspirin (unless otherwise instructed by your surgeon), Aleve, Naproxen, Ibuprofen, Motrin, Advil, Goody's, BC's, all herbal medications, fish oil, and all vitamins.   Coronavirus Screening Covid test is scheduled on DOS  Do you have any of the following symptoms:  Cough yes/no: No Fever (>100.41F)  yes/no: No Runny nose yes/no: No Sore throat yes/no: No Difficulty breathing/shortness of breath  yes/no: No  Have you traveled in the last 14 days and where? yes/no: No  Patient verbalized understanding of instructions that were given via phone.

## 2021-01-15 ENCOUNTER — Encounter (HOSPITAL_COMMUNITY): Payer: Self-pay | Admitting: Emergency Medicine

## 2021-01-15 ENCOUNTER — Ambulatory Visit (HOSPITAL_COMMUNITY): Payer: Medicare Other | Admitting: Anesthesiology

## 2021-01-15 ENCOUNTER — Encounter (HOSPITAL_COMMUNITY)
Admission: RE | Disposition: A | Discharge status: Home / Self Care | Payer: Self-pay | Source: Ambulatory Visit | Attending: Emergency Medicine

## 2021-01-15 ENCOUNTER — Ambulatory Visit (HOSPITAL_COMMUNITY): Payer: Medicare Other

## 2021-01-15 ENCOUNTER — Ambulatory Visit (HOSPITAL_COMMUNITY)
Admission: RE | Admit: 2021-01-15 | Discharge: 2021-01-15 | Disposition: A | Discharge status: Home / Self Care | Payer: Medicare Other | Source: Ambulatory Visit | Attending: Emergency Medicine | Admitting: Emergency Medicine

## 2021-01-15 ENCOUNTER — Other Ambulatory Visit: Payer: Self-pay

## 2021-01-15 DIAGNOSIS — M1990 Unspecified osteoarthritis, unspecified site: Secondary | ICD-10-CM | POA: Insufficient documentation

## 2021-01-15 DIAGNOSIS — C3492 Malignant neoplasm of unspecified part of left bronchus or lung: Secondary | ICD-10-CM

## 2021-01-15 DIAGNOSIS — I10 Essential (primary) hypertension: Secondary | ICD-10-CM | POA: Insufficient documentation

## 2021-01-15 DIAGNOSIS — J449 Chronic obstructive pulmonary disease, unspecified: Secondary | ICD-10-CM | POA: Diagnosis not present

## 2021-01-15 DIAGNOSIS — R911 Solitary pulmonary nodule: Secondary | ICD-10-CM

## 2021-01-15 DIAGNOSIS — F17210 Nicotine dependence, cigarettes, uncomplicated: Secondary | ICD-10-CM | POA: Diagnosis not present

## 2021-01-15 DIAGNOSIS — Z419 Encounter for procedure for purposes other than remedying health state, unspecified: Secondary | ICD-10-CM

## 2021-01-15 DIAGNOSIS — C3432 Malignant neoplasm of lower lobe, left bronchus or lung: Secondary | ICD-10-CM | POA: Insufficient documentation

## 2021-01-15 DIAGNOSIS — K219 Gastro-esophageal reflux disease without esophagitis: Secondary | ICD-10-CM | POA: Insufficient documentation

## 2021-01-15 DIAGNOSIS — Z20822 Contact with and (suspected) exposure to covid-19: Secondary | ICD-10-CM | POA: Insufficient documentation

## 2021-01-15 DIAGNOSIS — Z9889 Other specified postprocedural states: Secondary | ICD-10-CM

## 2021-01-15 HISTORY — PX: FIDUCIAL MARKER PLACEMENT: SHX6858

## 2021-01-15 HISTORY — DX: Malignant neoplasm of unspecified part of left bronchus or lung: C34.92

## 2021-01-15 HISTORY — PX: VIDEO BRONCHOSCOPY WITH RADIAL ENDOBRONCHIAL ULTRASOUND: SHX6849

## 2021-01-15 HISTORY — PX: BRONCHIAL BIOPSY: SHX5109

## 2021-01-15 HISTORY — DX: Other complications of anesthesia, initial encounter: T88.59XA

## 2021-01-15 HISTORY — PX: BRONCHIAL BRUSHINGS: SHX5108

## 2021-01-15 HISTORY — PX: BRONCHIAL NEEDLE ASPIRATION BIOPSY: SHX5106

## 2021-01-15 LAB — CBC
HCT: 44.3 % (ref 36.0–46.0)
Hemoglobin: 14.8 g/dL (ref 12.0–15.0)
MCH: 33.3 pg (ref 26.0–34.0)
MCHC: 33.4 g/dL (ref 30.0–36.0)
MCV: 99.8 fL (ref 80.0–100.0)
Platelets: 319 10*3/uL (ref 150–400)
RBC: 4.44 MIL/uL (ref 3.87–5.11)
RDW: 12.4 % (ref 11.5–15.5)
WBC: 11.1 10*3/uL — ABNORMAL HIGH (ref 4.0–10.5)
nRBC: 0 % (ref 0.0–0.2)

## 2021-01-15 LAB — SARS CORONAVIRUS 2 BY RT PCR (HOSPITAL ORDER, PERFORMED IN ~~LOC~~ HOSPITAL LAB): SARS Coronavirus 2: NEGATIVE

## 2021-01-15 LAB — COMPREHENSIVE METABOLIC PANEL
ALT: 19 U/L (ref 0–44)
AST: 21 U/L (ref 15–41)
Albumin: 4 g/dL (ref 3.5–5.0)
Alkaline Phosphatase: 67 U/L (ref 38–126)
Anion gap: 8 (ref 5–15)
BUN: 9 mg/dL (ref 8–23)
CO2: 25 mmol/L (ref 22–32)
Calcium: 9.8 mg/dL (ref 8.9–10.3)
Chloride: 105 mmol/L (ref 98–111)
Creatinine, Ser: 0.83 mg/dL (ref 0.44–1.00)
GFR, Estimated: >60 mL/min (ref >60)
Glucose, Bld: 100 mg/dL — ABNORMAL HIGH (ref 70–99)
Potassium: 3.6 mmol/L (ref 3.5–5.1)
Sodium: 138 mmol/L (ref 135–145)
Total Bilirubin: 1.4 mg/dL — ABNORMAL HIGH (ref 0.3–1.2)
Total Protein: 7.2 g/dL (ref 6.5–8.1)

## 2021-01-15 SURGERY — BRONCHOSCOPY, WITH BIOPSY USING ELECTROMAGNETIC NAVIGATION
Anesthesia: General

## 2021-01-15 MED ORDER — DEXAMETHASONE SODIUM PHOSPHATE 10 MG/ML IJ SOLN
INTRAMUSCULAR | Status: DC | PRN
  Administered 2021-01-15: 10 mg via INTRAVENOUS

## 2021-01-15 MED ORDER — PHENYLEPHRINE 40 MCG/ML (10ML) SYRINGE FOR IV PUSH (FOR BLOOD PRESSURE SUPPORT)
PREFILLED_SYRINGE | INTRAVENOUS | Status: DC | PRN
  Administered 2021-01-15: 40 ug via INTRAVENOUS

## 2021-01-15 MED ORDER — MIDAZOLAM HCL 2 MG/2ML IJ SOLN
INTRAMUSCULAR | Status: DC | PRN
  Administered 2021-01-15: 2 mg via INTRAVENOUS

## 2021-01-15 MED ORDER — OXYCODONE HCL 5 MG PO TABS: 5.0000 mg | ORAL_TABLET | Freq: Once | ORAL | Status: DC | PRN

## 2021-01-15 MED ORDER — OXYCODONE HCL 5 MG/5ML PO SOLN: 5.0000 mg | Freq: Once | ORAL | Status: DC | PRN

## 2021-01-15 MED ORDER — CHLORHEXIDINE GLUCONATE 0.12 % MT SOLN
OROMUCOSAL | Status: AC
  Administered 2021-01-15: 09:00:00 15 mL via OROMUCOSAL
  Filled 2021-01-15: qty 15

## 2021-01-15 MED ORDER — LACTATED RINGERS IV SOLN: INTRAVENOUS | Status: DC

## 2021-01-15 MED ORDER — PROPOFOL 10 MG/ML IV BOLUS
INTRAVENOUS | Status: DC | PRN
  Administered 2021-01-15: 150 mg via INTRAVENOUS

## 2021-01-15 MED ORDER — ONDANSETRON HCL 4 MG/2ML IJ SOLN
INTRAMUSCULAR | Status: DC | PRN
  Administered 2021-01-15: 4 mg via INTRAVENOUS

## 2021-01-15 MED ORDER — ROCURONIUM BROMIDE 10 MG/ML (PF) SYRINGE
PREFILLED_SYRINGE | INTRAVENOUS | Status: DC | PRN
Start: 2021-01-15 — End: 2021-01-15
  Administered 2021-01-15: 50 mg via INTRAVENOUS

## 2021-01-15 MED ORDER — LIDOCAINE 2% (20 MG/ML) 5 ML SYRINGE
INTRAMUSCULAR | Status: DC | PRN
  Administered 2021-01-15: 60 mg via INTRAVENOUS

## 2021-01-15 MED ORDER — CHLORHEXIDINE GLUCONATE 0.12 % MT SOLN: 15.0000 mL | Freq: Once | OROMUCOSAL | Status: AC

## 2021-01-15 MED ORDER — FENTANYL CITRATE (PF) 100 MCG/2ML IJ SOLN: 25.0000 ug | INTRAMUSCULAR | Status: DC | PRN

## 2021-01-15 MED ORDER — ONDANSETRON HCL 4 MG/2ML IJ SOLN: 4.0000 mg | Freq: Four times a day (QID) | INTRAMUSCULAR | Status: DC | PRN

## 2021-01-15 MED ORDER — FENTANYL CITRATE (PF) 100 MCG/2ML IJ SOLN
INTRAMUSCULAR | Status: DC | PRN
  Administered 2021-01-15 (×2): 50 ug via INTRAVENOUS

## 2021-01-15 MED ORDER — SUGAMMADEX SODIUM 200 MG/2ML IV SOLN
INTRAVENOUS | Status: DC | PRN
  Administered 2021-01-15: 200 mg via INTRAVENOUS

## 2021-01-15 MED ORDER — SPIRIVA RESPIMAT 1.25 MCG/ACT IN AERS: 2.0000 | INHALATION_SPRAY | Freq: Every evening | RESPIRATORY_TRACT | Status: DC

## 2021-01-15 SURGICAL SUPPLY — 1 items: superlock fiducial marker ×2 IMPLANT

## 2021-01-15 NOTE — Anesthesia Preprocedure Evaluation (Signed)
Anesthesia Evaluation  Patient identified by MRN, date of birth, ID band Patient awake    Reviewed: Allergy & Precautions, H&P , NPO status , Patient's Chart, lab work & pertinent test results  Airway Mallampati: II   Neck ROM: full    Dental   Pulmonary COPD, Current Smoker and Patient abstained from smoking.,    breath sounds clear to auscultation       Cardiovascular hypertension,  Rhythm:regular Rate:Normal     Neuro/Psych  Headaches,    GI/Hepatic GERD  ,  Endo/Other    Renal/GU      Musculoskeletal  (+) Arthritis ,   Abdominal   Peds  Hematology   Anesthesia Other Findings   Reproductive/Obstetrics                             Anesthesia Physical Anesthesia Plan  ASA: 3  Anesthesia Plan: General   Post-op Pain Management:    Induction: Intravenous  PONV Risk Score and Plan: 2 and Ondansetron, Dexamethasone and Treatment may vary due to age or medical condition  Airway Management Planned: Oral ETT  Additional Equipment:   Intra-op Plan:   Post-operative Plan: Extubation in OR  Informed Consent: I have reviewed the patients History and Physical, chart, labs and discussed the procedure including the risks, benefits and alternatives for the proposed anesthesia with the patient or authorized representative who has indicated his/her understanding and acceptance.     Dental advisory given  Plan Discussed with: CRNA, Anesthesiologist and Surgeon  Anesthesia Plan Comments:         Anesthesia Quick Evaluation

## 2021-01-15 NOTE — Anesthesia Procedure Notes (Signed)
Procedure Name: Intubation Date/Time: 01/15/2021 11:53 AM Performed by: Genelle Bal, CRNA Pre-anesthesia Checklist: Patient identified, Emergency Drugs available, Suction available and Patient being monitored Patient Re-evaluated:Patient Re-evaluated prior to induction Oxygen Delivery Method: Circle system utilized Preoxygenation: Pre-oxygenation with 100% oxygen Induction Type: IV induction Ventilation: Mask ventilation without difficulty Laryngoscope Size: Miller and 2 Grade View: Grade I Tube type: Oral Tube size: 8.5 mm Number of attempts: 1 Airway Equipment and Method: Stylet and Oral airway Placement Confirmation: ETT inserted through vocal cords under direct vision, positive ETCO2 and breath sounds checked- equal and bilateral Secured at: 22 cm Tube secured with: Tape Dental Injury: Teeth and Oropharynx as per pre-operative assessment

## 2021-01-15 NOTE — Discharge Instructions (Signed)
Flexible Bronchoscopy, Care After This sheet gives you information about how to care for yourself after your test. Your doctor may also give you more specific instructions. If you have problems or questions, contact your doctor. Follow these instructions at home: Eating and drinking Do not eat or drink anything (not even water) for 2 hours after your test, or until your numbing medicine (local anesthetic) wears off. When your numbness is gone and your cough and gag reflexes have come back, you may: Eat only soft foods. Slowly drink liquids. The day after the test, go back to your normal diet. Driving Do not drive for 24 hours if you were given a medicine to help you relax (sedative). Do not drive or use heavy machinery while taking prescription pain medicine. General instructions  Take over-the-counter and prescription medicines only as told by your doctor. Return to your normal activities as told. Ask what activities are safe for you. Do not use any products that have nicotine or tobacco in them. This includes cigarettes and e-cigarettes. If you need help quitting, ask your doctor. Keep all follow-up visits as told by your doctor. This is important. It is very important if you had a tissue sample (biopsy) taken. Get help right away if: You have shortness of breath that gets worse. You get light-headed. You feel like you are going to pass out (faint). You have chest pain. You cough up: More than a little blood. More blood than before. Summary Do not eat or drink anything (not even water) for 2 hours after your test, or until your numbing medicine wears off. Do not use cigarettes. Do not use e-cigarettes. Get help right away if you have chest pain.  Please call our office for any questions or concerns.  [phone].  This information is not intended to replace advice given to you by your health care provider. Make sure you discuss any questions you have with your health care  provider. Document Released: 11/11/2008 Document Revised: 12/27/2016 Document Reviewed: 02/02/2016 Elsevier Patient Education  2020 [employer].

## 2021-01-15 NOTE — Op Note (Signed)
Video Bronchoscopy with Robotic Assisted Bronchoscopic Navigation   Date of Operation: 01/15/2021   Pre-op Diagnosis: Left lower lobe solitary pulmonary nodule  Post-op Diagnosis: Same  Surgeon: Baltazar Apo  Assistants: None  Anesthesia: General endotracheal anesthesia  Operation: Flexible video fiberoptic bronchoscopy with robotic assistance and biopsies.  Estimated Blood Loss: Minimal  Complications: None  Indications and History: Sydney Hunt is a 60 y.o. female with history of tobacco use.  She was found to have a left lower lobe pulmonary nodule on lung cancer screening CT scan of the chest.  This was hypermetabolic on PET scan.  She presents now for robotic assisted navigational bronchoscopy to achieve a tissue diagnosis. The risks, benefits, complications, treatment options and expected outcomes were discussed with the patient.  The possibilities of pneumothorax, pneumonia, reaction to medication, pulmonary aspiration, perforation of a viscus, bleeding, failure to diagnose a condition and creating a complication requiring transfusion or operation were discussed with the patient who freely signed the consent.    Description of Procedure: The patient was seen in the Preoperative Area, was examined and was deemed appropriate to proceed.  The patient was taken to [facility] endoscopy room 3, identified as Sydney Hunt and the procedure verified as Flexible Video Fiberoptic Bronchoscopy.  A Time Out was held and the above information confirmed.   Prior to the date of the procedure a high-resolution CT scan of the chest was performed. Utilizing ION software program a virtual tracheobronchial tree was generated to allow the creation of distinct navigation pathways to the patient's parenchymal abnormalities. After being taken to the operating room general anesthesia was initiated and the patient  was orally intubated. The video fiberoptic bronchoscope was introduced via the endotracheal tube and a  general inspection was performed which showed normal right and left lung anatomy, aspiration of the bilateral mainstems was completed to remove any remaining secretions. Robotic catheter inserted into patient's endotracheal tube.   Target #1 left lower lobe pulmonary nodule: The distinct navigation pathways prepared prior to this procedure were then utilized to navigate to patient's lesion identified on CT scan. The robotic catheter was secured into place and the vision probe was withdrawn.  Lesion location was approximated using fluoroscopy and radial endobronchial ultrasound for peripheral targeting.  Local registration and targeting was performed using Cios three-dimensional imaging.  Under fluoroscopic guidance transbronchial brushings, transbronchial needle biopsies, and transbronchial forceps biopsies were performed to be sent for cytology and pathology.  Under fluoroscopic guidance a single fiducial marker was placed adjacent to the nodule.   At the end of the procedure a general airway inspection was performed and there was no evidence of active bleeding. The bronchoscope was removed.  The patient tolerated the procedure well. There was no significant blood loss and there were no obvious complications. A post-procedural chest x-ray is pending.  Samples Target #1: 1. Transbronchial needle brushings from left lower lobe pulmonary nodule 2. Transbronchial Wang needle biopsies from left lower lobe pulmonary nodule 3. Transbronchial forceps biopsies from left lower lobe pulmonary nodule   Plans:  The patient will be discharged from the PACU to home when recovered from anesthesia and after chest x-ray is reviewed. We will review the cytology, pathology and microbiology results with the patient when they become available. Outpatient followup will be with Dr Lamonte Sakai.   Baltazar Apo, MD, PhD 01/15/2021, 12:45 PM [facility] [phone] or if no answer before 7:00PM call  [phone] For any issues after 7:00PM please call eLink [phone]

## 2021-01-15 NOTE — H&P (Signed)
Sydney Hunt is an 60 y.o. female.   Chief Complaint: Left lower lobe pulmonary nodule HPI: 60 year old woman with history tobacco use and COPD.  She presents in lung cancer screening program and had a left lower lobe pulmonary nodule found on 10/13/2020.  PET scan showed a 12.5 mm lobulated left lower lobe pulmonary nodule that was hypermetabolic 18/08/4164.  Recommendation made to perform navigational bronchoscopy to get a tissue diagnosis, facilitate next steps in therapy.  She denies any new issues.  She has baseline cough.  She still smoking about 5 cigarettes daily.  She is trying to cut down with a goal towards ultimately quitting.  Past Medical History:  Diagnosis Date   Allergy    Arthritis    Complication of anesthesia    slow to wake up   COPD (chronic obstructive pulmonary disease) (HCC)    GERD (gastroesophageal reflux disease)    Hypertension    Migraines     Past Surgical History:  Procedure Laterality Date   ABDOMINAL HYSTERECTOMY  1989   unsure of vaginal or abdominal   COLONOSCOPY  2022   ovaries removed  1986   surgery to remove scar tissue     from colon/bladder in patient's late 30's   TUBAL LIGATION  1986   UPPER GI ENDOSCOPY     years ago in patient's late 60s    Family History  Problem Relation Age of Onset   Pancreatic cancer Maternal Aunt 72   Brain cancer Maternal Uncle    Stomach cancer Maternal Uncle        dx. >50   Prostate cancer Half-Brother    Prostate cancer Half-Brother    Social History:  reports that she has been smoking cigarettes. She started smoking about 60 years ago. She has a 29.00 pack-year smoking history. She has never used smokeless tobacco. She reports current alcohol use of about 4.0 standard drinks per week. She reports current drug use. Frequency: 3.00 times per week. Drug: Marijuana.  Allergies:  Allergies  Allergen Reactions   Codeine Rash   Demerol [Meperidine Hcl] Other (See Comments)    coma   Other Dermatitis     Stainless steel / skin weeps   Silver Dermatitis    Sterling silver skin weeps   Gold Bond [Menthol-Zinc Oxide] Dermatitis    Skin weeps   Neosporin [Bacitracin-Polymyxin B] Dermatitis    Skin weeps    Medications Prior to Admission  Medication Sig Dispense Refill   acetaminophen (TYLENOL) 500 MG tablet Take 1,000 mg by mouth daily.     albuterol (VENTOLIN HFA) 108 (90 Base) MCG/ACT inhaler Inhale 2 puffs into the lungs every 4 (four) hours as needed for wheezing or shortness of breath.     amitriptyline (ELAVIL) 10 MG tablet Take 10 mg by mouth at bedtime as needed for sleep.     aspirin 81 MG EC tablet Take 81 mg by mouth daily.     b complex vitamins capsule Take 1 capsule by mouth daily.     hydrochlorothiazide (HYDRODIURIL) 25 MG tablet Take 25 mg by mouth daily.     meloxicam (MOBIC) 7.5 MG tablet Take 7.5 mg by mouth in the morning and at bedtime.     Tiotropium Bromide Monohydrate (SPIRIVA RESPIMAT) 1.25 MCG/ACT AERS Inhale 2 puffs into the lungs daily. (Patient taking differently: Inhale 2 puffs into the lungs at bedtime.) 4 g 0    Results for orders placed or performed during the hospital encounter of 01/15/21 (from the  past 48 hour(s))  SARS Coronavirus 2 by RT PCR (hospital order, performed in [facility] hospital lab) Nasopharyngeal Nasopharyngeal Swab     Status: None   Collection Time: 01/15/21  8:21 AM   Specimen: Nasopharyngeal Swab  Result Value Ref Range   SARS Coronavirus 2 NEGATIVE NEGATIVE    Comment: (NOTE) SARS-CoV-2 target nucleic acids are NOT DETECTED.  The SARS-CoV-2 RNA is generally detectable in upper and lower respiratory specimens during the acute phase of infection. The lowest concentration of SARS-CoV-2 viral copies this assay can detect is 250 copies / mL. A negative result does not preclude SARS-CoV-2 infection and should not be used as the sole basis for treatment or other patient management decisions.  A negative result may occur with improper  specimen collection / handling, submission of specimen other than nasopharyngeal swab, presence of viral mutation(s) within the areas targeted by this assay, and inadequate number of viral copies (<250 copies / mL). A negative result must be combined with clinical observations, patient history, and epidemiological information.  Fact Sheet for Patients:   StrictlyIdeas.no  Fact Sheet for Healthcare Providers: BankingDealers.co.za  This test is not yet approved or  cleared by the [place] FDA and has been authorized for detection and/or diagnosis of SARS-CoV-2 by FDA under an Emergency Use Authorization (EUA).  This EUA will remain in effect (meaning this test can be used) for the duration of the COVID-19 declaration under Section 564(b)(1) of the Act, 21 U.S.C. section 360bbb-3(b)(1), unless the authorization is terminated or revoked sooner.  Performed at [facility], [place] [address] [place], [place] [address]   CBC per protocol     Status: Abnormal   Collection Time: 01/15/21  8:22 AM  Result Value Ref Range   WBC 11.1 (H) 4.0 - 10.5 K/uL   RBC 4.44 3.87 - 5.11 MIL/uL   Hemoglobin 14.8 12.0 - 15.0 g/dL   HCT 44.3 36.0 - 46.0 %   MCV 99.8 80.0 - 100.0 fL   MCH 33.3 26.0 - 34.0 pg   MCHC 33.4 30.0 - 36.0 g/dL   RDW 12.4 11.5 - 15.5 %   Platelets 319 150 - 400 K/uL   nRBC 0.0 0.0 - 0.2 %    Comment: Performed at [facility], [place] [address], [place],  [address]   No results found.  Review of Systems  Blood pressure (!) 158/84, pulse 61, temperature 98.5 F (36.9 C), temperature source Oral, resp. rate 17, height 5' (1.524 m), weight 58.5 kg, SpO2 98 %. Physical Exam  Gen: Pleasant, well-nourished, in no distress,  normal affect  ENT: No lesions,  mouth clear,  oropharynx clear, no postnasal drip  Neck: No JVD, no stridor  Lungs: No use of accessory muscles, distant, no crackles or wheezing on normal  respiration, no wheeze on forced expiration  Cardiovascular: RRR, heart sounds normal, no murmur or gallops, no peripheral edema  Abdomen: soft and NT, no HSM,  BS normal  Musculoskeletal: No deformities, no cyanosis or clubbing  Neuro: alert, awake, non focal  Skin: Warm, no lesions or rash  Assessment/Plan Isolated left lower lobe pulmonary nodule, suspicious for primary malignancy and patient with a heavy tobacco history.  She presents now for navigational bronchoscopy, biopsies to facilitate therapy.  This will probably be SBRT as she is still smoking.  Procedure discussed with her in detail including pros, cons.  All questions answered.  No barriers to proceeding.  Collene Gobble, MD 01/15/2021, 9:43  AM

## 2021-01-15 NOTE — Transfer of Care (Signed)
Immediate Anesthesia Transfer of Care Note  Patient: Sydney Hunt  Procedure(s) Performed: ROBOTIC ASSISTED NAVIGATIONAL BRONCHOSCOPY BRONCHIAL BIOPSIES BRONCHIAL BRUSHINGS BRONCHIAL NEEDLE ASPIRATION BIOPSIES VIDEO BRONCHOSCOPY WITH RADIAL ENDOBRONCHIAL ULTRASOUND FIDUCIAL MARKER PLACEMENT  Patient Location: PACU  Anesthesia Type:General  Level of Consciousness: awake, alert  and oriented  Airway & Oxygen Therapy: Patient Spontanous Breathing and Patient connected to face mask oxygen  Post-op Assessment: Report given to RN and Post -op Vital signs reviewed and stable  Post vital signs: Reviewed and stable  Last Vitals:  Vitals Value Taken Time  BP 148/89 01/15/21 1253  Temp    Pulse 100 01/15/21 1253  Resp 18 01/15/21 1253  SpO2 96 % 01/15/21 1253  Vitals shown include unvalidated device data.  Last Pain:  Vitals:   01/15/21 0829  TempSrc: Oral  PainSc:          Complications: No notable events documented.

## 2021-01-17 NOTE — Anesthesia Postprocedure Evaluation (Signed)
Anesthesia Post Note  Patient: Sydney Hunt  Procedure(s) Performed: ROBOTIC ASSISTED NAVIGATIONAL BRONCHOSCOPY BRONCHIAL BIOPSIES BRONCHIAL BRUSHINGS BRONCHIAL NEEDLE ASPIRATION BIOPSIES VIDEO BRONCHOSCOPY WITH RADIAL ENDOBRONCHIAL ULTRASOUND FIDUCIAL MARKER PLACEMENT     Patient location during evaluation: PACU Anesthesia Type: General Level of consciousness: awake and alert Pain management: pain level controlled Vital Signs Assessment: post-procedure vital signs reviewed and stable Respiratory status: spontaneous breathing, nonlabored ventilation, respiratory function stable and patient connected to nasal cannula oxygen Cardiovascular status: blood pressure returned to baseline and stable Postop Assessment: no apparent nausea or vomiting Anesthetic complications: no   No notable events documented.  Last Vitals:  Vitals:   01/15/21 1323 01/15/21 1338  BP: 127/80 133/80  Pulse: 81 77  Resp: 16 20  Temp:  37.2 C  SpO2: 92% 91%    Last Pain:  Vitals:   01/15/21 1338  TempSrc:   PainSc: 0-No pain   Pain Goal:                   Deborh Pense S

## 2021-01-18 ENCOUNTER — Encounter (HOSPITAL_COMMUNITY): Payer: Self-pay | Admitting: Emergency Medicine

## 2021-01-19 ENCOUNTER — Telehealth: Payer: Self-pay | Admitting: Emergency Medicine

## 2021-01-19 DIAGNOSIS — C3490 Malignant neoplasm of unspecified part of unspecified bronchus or lung: Secondary | ICD-10-CM

## 2021-01-19 LAB — CYTOLOGY - NON PAP

## 2021-01-19 NOTE — Telephone Encounter (Signed)
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 She has seen Dr Kipp Brood but is likely going to get SBRT since she is still smoking (she is trying to quit).  Will refer her back to [facility] Scrape at [place] to coordinate next steps.

## 2021-01-26 ENCOUNTER — Telehealth: Payer: Self-pay | Admitting: Oncology

## 2021-01-26 NOTE — Telephone Encounter (Signed)
Patient referred by Dr Baltazar Apo for Lung CA.  Appt made 02/12/21 Labs 2:30 pm - Consult 3:00 pm  Patient has been seen by Lenna Sciara

## 2021-02-09 NOTE — Progress Notes (Signed)
[facility]  [address] [place],  [place]  [address] [phone]  Clinic Day:  02/12/2021  Referring physician: Collene Gobble, MD  This document serves as a record of services personally performed by Hosie Poisson, MD. It was created on their behalf by Curry,Lauren E, a trained medical scribe. The creation of this record is based on the scribe's personal observations and the provider's statements to them.  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 This was initially found on lung cancer screening CT in September 2022. Most recent CT imaging revealed the solitary left lower lobe nodule to be enlarging, measuring 1.2 cm in September and now measuring 1.4 x 1.9 cm. She was deemed a candidate for resection and met with Dr. Kipp Brood for consultation. As she is not motivated to quit smoking, he felt that surgery would present an excessive risk for postoperative respiratory complications. He recommended SBRT as a good option if the patient was unable to quit smoking. However, I feel surgical resection has a better success rate of being curative iand I have recommended a second thoracic surgery opinion.   Tobacco abuse. She has tried patches and gum in the past without success. Chantix was beginning to work, however, she developed sleep disturbances with hallucinations. She is not motivated to quit.  Dissociative identity disorder with 3 personalities. She continues to follow with a psychiatrist.   This is a pleasant 61 year old female with stage I adenocarcinoma of the left lung. As this is a solitary lesion, chemotherapy is not indicated. Treatment options were reviewed today including surgical resection or stereotactic radiation. However, I explained that surgical resection has a better success rate of being curative. She and her daughter wish to pursue a second surgical opinion, and so we will refer her  as requested. Once she makes a decision on treatment, we can continue to follow her for routine surveillance. She and her daughter understand and agree with this plan of care. I have answered their questions and know to call with any concerns.  Thank you for the opportunity to participate in the care of your patients  I provided 55 minutes of face-to-face time during this this encounter and > 50% was spent counseling as documented under my assessment and plan.    Derwood Kaplan, MD [facility] [address] [place] [address] Dept: [phone] Dept Fax: [phone]    CHIEF COMPLAINT:  CC: Adenocarcinoma of the left lung  Current Treatment:  Referral for 2nd opinion on surgical resection   HISTORY OF PRESENT ILLNESS:  Jyra Lagares is a 61 y.o. female with a history of tobacco abuse who has adenocarcinoma of the left lung. This was originally found on lung cancer screening CT from September 2022. PET imaging was obtained in October which revealed a solitary 1.25 cm left lower lobe pulmonary nodule to be hypermetabolic and consistent with small primary lung neoplasm. No findings for mediastinal or hilar adenopathy or metastatic disease elsewhere was observed. She met with the genetic counselor and [employer] diagnostic testing was negative for any clinically significant mutations. CT chest from December revealed enlarging left lower lobe nodule, measuring 1.4 x 1.9 cm. The additional smaller pulmonary nodules remained unchanged. She was referred to Dr. Baltazar Apo and underwent bronchoscopy on December 19th and cytology from this procedure confirmed malignant cells consistent with adenocarcioma. Immunohistochemical stains were  positive for TTF-1, and negative for p63 and CK5/6. 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 Her FEV1 was 1.98, which is 88% of predicted and her FVC was 2.58, which is 89% of  predicted. She was deemed a candidate for resection and met with Dr. Kipp Brood for consultation. As she is not motivated to quit smoking, he felt that surgery would present an excessive risk for postoperative respiratory complications. He recommended SBRT as a good option if the patient was unable to quit smoking.  INTERVAL HISTORY:  I have reviewed her chart and materials related to her cancer extensively and collaborated history with the patient. Summary of oncologic history is as follows: Oncology History   No history exists.    Lauryn reports productive cough with clear sputum. She denies hemoptysis or chest pain. She has occasional wheezing. She continues to smoke 1/2 a pack per day and has smoked since she was 61 years old. She has tried to quit smoking on multiple occasions with multiple aids without success. She even had hallucinations with Chantix. She has dissociative identity disorder and has 3 personalities. Blood counts and chemistries are unremarkable except for a white count of 11.6. Her  appetite is good, and she is eating well. She denies any significant unintentional weight loss or gain.  She denies fever, chills or other signs of infection.  She denies nausea, vomiting, bowel issues, or abdominal pain.  She denies sore throat, cough, dyspnea, or chest pain. She is up to date on annual mammogram. She underwent colonoscopy recently which did reveal polyps. Her daughter accompanies her virtually.  HISTORY:   Past Medical History:  Diagnosis Date   Allergy    Arthritis    Complication of anesthesia    slow to wake up   COPD (chronic obstructive pulmonary disease) (HCC)    GERD (gastroesophageal reflux disease)    Hypertension    Migraines     Past Surgical History:  Procedure Laterality Date   ABDOMINAL HYSTERECTOMY  1989   unsure of vaginal or abdominal   BRONCHIAL BIOPSY  01/15/2021   Procedure: BRONCHIAL BIOPSIES;  Surgeon: Collene Gobble, MD;  Location: [facility];   Service: Pulmonary;;   BRONCHIAL BRUSHINGS  01/15/2021   Procedure: BRONCHIAL BRUSHINGS;  Surgeon: Collene Gobble, MD;  Location: [facility];  Service: Pulmonary;;   BRONCHIAL NEEDLE ASPIRATION BIOPSY  01/15/2021   Procedure: BRONCHIAL NEEDLE ASPIRATION BIOPSIES;  Surgeon: Collene Gobble, MD;  Location: [facility];  Service: Pulmonary;;   COLONOSCOPY  2022   FIDUCIAL MARKER PLACEMENT  01/15/2021   Procedure: FIDUCIAL MARKER PLACEMENT;  Surgeon: Collene Gobble, MD;  Location: [facility];  Service: Pulmonary;;   ovaries removed  1986   surgery to remove scar tissue     from colon/bladder in patient's late 30's   [place]     years ago in patient's late 39s   [place]  01/15/2021   Procedure: [place];  Surgeon: Collene Gobble, MD;  Location: [facility];  Service: Pulmonary;;    Family History  Problem Relation Age of Onset   Pancreatic cancer Maternal Aunt 58   Brain cancer Maternal Uncle    Stomach cancer Maternal Uncle        dx. >50   Prostate cancer Half-Brother        passed away at 64   Prostate cancer Half-Brother  Social History:  reports that she has been smoking cigarettes. She started smoking about 60 years ago. She has a 29.50 pack-year smoking history. She has never used smokeless tobacco. She reports current alcohol use of about 4.0 standard drinks per week. She reports current drug use. Frequency: 3.00 times per week. Drug: Marijuana.The patient is accompanied by her daughter virtually today. She is widowed and lives at home. She has 2 biological children and 1 stepchild. She is disabled, and has been exposed to chemicals or other toxic agents. She was previously employed at a hosiery, chicken farm and cleaning houses. 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  Allergies:  Allergies  Allergen Reactions    Codeine Rash   Demerol [Meperidine Hcl] Other (See Comments)    coma   Other Dermatitis    Stainless steel / skin weeps   Silver Dermatitis    Sterling silver skin weeps   Gold Bond [Menthol-Zinc Oxide] Dermatitis    Skin weeps   Neosporin [Bacitracin-Polymyxin B] Dermatitis    Skin weeps    Current Medications: Current Outpatient Medications  Medication Sig Dispense Refill   celecoxib (CELEBREX) 100 MG capsule      acetaminophen (TYLENOL) 500 MG tablet Take 1,000 mg by mouth daily.     albuterol (VENTOLIN HFA) 108 (90 Base) MCG/ACT inhaler Inhale 2 puffs into the lungs every 4 (four) hours as needed for wheezing or shortness of breath.     amitriptyline (ELAVIL) 10 MG tablet Take 10 mg by mouth at bedtime as needed for sleep.     aspirin 81 MG EC tablet Take 81 mg by mouth daily.     b complex vitamins capsule Take 1 capsule by mouth daily.     hydrochlorothiazide (HYDRODIURIL) 25 MG tablet Take 25 mg by mouth daily.     Tiotropium Bromide Monohydrate (SPIRIVA RESPIMAT) 1.25 MCG/ACT AERS Inhale 2 puffs into the lungs at bedtime.     No current facility-administered medications for this visit.    REVIEW OF SYSTEMS:  Review of Systems  Constitutional: Negative.  Negative for appetite change, chills, fatigue, fever and unexpected weight change.  HENT:  Negative.    Eyes: Negative.   Respiratory:  Positive for cough (occasionally productive with clear sputum) and wheezing (occasional). Negative for chest tightness, hemoptysis and shortness of breath.   Cardiovascular: Negative.  Negative for chest pain, leg swelling and palpitations.  Gastrointestinal: Negative.  Negative for abdominal distention, abdominal pain, blood in stool, constipation, diarrhea, nausea and vomiting.  Endocrine: Negative.   Genitourinary: Negative.  Negative for difficulty urinating, dysuria, frequency and hematuria.   Musculoskeletal: Negative.  Negative for arthralgias, back pain, flank pain, gait  problem and myalgias.  Skin: Negative.   Neurological: Negative.  Negative for dizziness, extremity weakness, gait problem, headaches, light-headedness, numbness, seizures and speech difficulty.  Hematological: Negative.   Psychiatric/Behavioral: Negative.  Negative for depression and sleep disturbance. The patient is not nervous/anxious.        Dissociative identity disorder     VITALS:  Blood pressure 124/70, pulse 70, temperature 98.1 F (36.7 C), temperature source Oral, resp. rate 20, height '5\' 1"'  (1.549 m), weight 130 lb 6.4 oz (59.1 kg), SpO2 96 %.  Wt Readings from Last 3 Encounters:  02/12/21 130 lb 6.4 oz (59.1 kg)  01/15/21 129 lb (58.5 kg)  01/05/21 129 lb (58.5 kg)    Body mass index is 24.64 kg/m.  Performance status (ECOG): 1 - Symptomatic but completely ambulatory  PHYSICAL EXAM:  Physical Exam Constitutional:      General: She is not in acute distress.    Appearance: Normal appearance. She is normal weight.  HENT:     Head: Normocephalic and atraumatic.  Eyes:     General: No scleral icterus.    Extraocular Movements: Extraocular movements intact.     Conjunctiva/sclera: Conjunctivae normal.     Pupils: Pupils are equal, round, and reactive to light.  Cardiovascular:     Rate and Rhythm: Normal rate and regular rhythm.     Pulses: Normal pulses.     Heart sounds: Normal heart sounds. No murmur heard.   No friction rub. No gallop.  Pulmonary:     Effort: Pulmonary effort is normal. No respiratory distress.     Breath sounds: Normal breath sounds.  Abdominal:     General: Bowel sounds are normal. There is no distension.     Palpations: Abdomen is soft. There is no hepatomegaly, splenomegaly or mass.     Tenderness: There is no abdominal tenderness.  Musculoskeletal:        General: Normal range of motion.     Cervical back: Normal range of motion and neck supple.     Right lower leg: No edema.     Left lower leg: No edema.  Lymphadenopathy:      Cervical: No cervical adenopathy.  Skin:    General: Skin is warm and dry.  Neurological:     General: No focal deficit present.     Mental Status: She is alert and oriented to person, place, and time. Mental status is at baseline.  Psychiatric:        Mood and Affect: Mood normal.        Behavior: Behavior normal.        Thought Content: Thought content normal.        Judgment: Judgment normal.     LABS:   CBC Latest Ref Rng & Units 02/12/2021 01/15/2021  WBC - 11.6 11.1(H)  Hemoglobin 12.0 - 16.0 14.2 14.8  Hematocrit 36 - 46 42 44.3  Platelets 150 - 399 347 319   CMP Latest Ref Rng & Units 02/12/2021 01/15/2021  Glucose 70 - 99 mg/dL - 100(H)  BUN 4 - '21 11 9  ' Creatinine 0.5 - 1.1 0.9 0.83  Sodium 137 - 147 140 138  Potassium 3.4 - 5.3 3.9 3.6  Chloride 99 - 108 105 105  CO2 13 - 22 30(A) 25  Calcium 8.7 - 10.7 10.1 9.8  Total Protein 6.5 - 8.1 g/dL - 7.2  Total Bilirubin 0.3 - 1.2 mg/dL - 1.4(H)  Alkaline Phos 25 - 125 74 67  AST 13 - 35 23 21  ALT 7 - 35 21 19     No results found for: CEA1 / No results found for: CEA1   STUDIES:  DG Chest Port 1 View  Result Date: 01/15/2021 CLINICAL DATA:  Follow-up bronchoscopy and biopsy on the left EXAM: PORTABLE CHEST 1 VIEW COMPARISON:  01/09/2021 FINDINGS: Heart and mediastinal shadows are normal. The right lung is clear. Lower left lateral pulmonary density is visible but without evidence of post bronchoscopy complication. No pneumothorax or hemothorax. IMPRESSION: No evidence of complication following bronchoscopy and biopsy. Mild density visible in the lower left lateral lung. Electronically Signed   By: Nelson Chimes M.D.   On: 01/15/2021 13:13   DG C-ARM BRONCHOSCOPY  Result Date: 01/15/2021 C-ARM BRONCHOSCOPY: Fluoroscopy was utilized by the requesting physician.  No  radiographic interpretation.      EXAM: 10/13/2020 CT CHEST WITHOUT CONTRAST LOW-DOSE FOR LUNG CANCER SCREENING   TECHNIQUE:  Multidetector CT  imaging of the chest was performed following the  standard protocol without IV contrast.   COMPARISON: None.   FINDINGS:  Cardiovascular: Atherosclerotic calcification of the aorta. Heart  size normal. No pericardial effusion.  Mediastinum/Nodes: No pathologically enlarged mediastinal or  axillary lymph nodes. Hilar regions are difficult to definitively  evaluate without IV contrast but appear grossly unremarkable. There  may be distal esophageal wall thickening which can be seen with  gastroesophageal reflux.  Lungs/Pleura: Mild biapical pleuroparenchymal scarring.  Centrilobular and paraseptal emphysema. Smoking related respiratory  bronchiolitis. 11.2 mm peripheral left lower lobe nodule (4/178) has  somewhat lobulated borders. Other pulmonary nodules measure 5.3 mm  or less in size. No pleural fluid. Airway is unremarkable.  Upper Abdomen: Visualized portions of the liver, gallbladder,  adrenal glands, kidneys, spleen, pancreas, stomach and bowel are  grossly unremarkable.  Musculoskeletal: No worrisome lytic or sclerotic lesions.   IMPRESSION:  1. Lung-RADS 4A, suspicious. Follow up low-dose chest CT without  contrast in 3 months (please use the following order, "CT CHEST LCS  NODULE FOLLOW-UP W/O CM") is recommended. Alternatively, PET may be  considered when there is a solid component 12m or larger. 11.2 mm  peripheral left lower lobe nodule. These results will be called to  the ordering clinician or representative by the Radiologist  Assistant, and communication documented in the PACS or [employer]  2. Aortic atherosclerosis (ICD10-I70.0).  3. Emphysema (ICD10-J43.9).   EXAM: 11/01/2020 NUCLEAR MEDICINE PET SKULL BASE TO THIGH   TECHNIQUE: 6.51 mCi F-18 FDG was injected intravenously. Full-ring PET imaging was performed from the skull base to thigh after the radiotracer. CT data was obtained and used for attenuation correction and anatomic localization.    Fasting blood glucose: 103 mg/dl   COMPARISON:  Chest CT 10/13/2020   FINDINGS: Mediastinal blood pool activity: SUV max 2.13   Liver activity: SUV max NA   NECK: No hypermetabolic lymph nodes in the neck.   Incidental CT findings: none   CHEST: 12.5 mm lobulated left lower lobe subpleural pulmonary nodule is hypermetabolic with SUV max of 59.81and consistent with primary lung neoplasm.   No enlarged or hypermetabolic mediastinal or hilar lymph nodes to suggest metastatic adenopathy. No supraclavicular or axillary adenopathy.   No other pulmonary lesions.   Incidental CT findings: Stable underlying emphysematous changes and stable vascular calcifications.   ABDOMEN/PELVIS: No abnormal hypermetabolic activity within the liver, pancreas, adrenal glands, or spleen. No hypermetabolic lymph nodes in the abdomen or pelvis.   Incidental CT findings: Scattered atherosclerotic calcifications involving the aorta and iliac arteries.   SKELETON: No significant bony findings. No evidence of metastatic disease.   Incidental CT findings: none   IMPRESSION: 1. Solitary 12.5 mm left lower lobe pulmonary nodule is hypermetabolic and consistent with small primary lung neoplasm. 2. No findings for mediastinal or hilar adenopathy or metastatic disease elsewhere.  FINAL MICROSCOPIC DIAGNOSIS: 01/15/2021  A. LUNG, LLL, BRUSH:  - Atypical cells present   B. LUNG, LLL, FINE NEEDLE ASPIRATION:  - Malignant cells consistent with adenocarcinoma, see comment   COMMENT:   B.   Immunohistochemical stains show that the tumor cells are positive  for TTF-1 while they are negative for p63 and CK5/6, consistent with  above interpretation.  Dr. PSaralyn Pilarreviewed the case and concurs with  the diagnosis.  Dr. BLamonte Sakaiwas  notified on 01/19/2021.    I, Rita Ohara, am acting as scribe for Derwood Kaplan, MD  I have reviewed this report as typed by the medical scribe, and it is complete  and accurate.

## 2021-02-12 ENCOUNTER — Other Ambulatory Visit: Payer: Self-pay

## 2021-02-12 ENCOUNTER — Other Ambulatory Visit: Payer: Self-pay | Admitting: Oncology

## 2021-02-12 ENCOUNTER — Inpatient Hospital Stay: Payer: Medicare Other | Attending: Emergency Medicine

## 2021-02-12 ENCOUNTER — Inpatient Hospital Stay (INDEPENDENT_AMBULATORY_CARE_PROVIDER_SITE_OTHER): Payer: Medicare Other | Admitting: Oncology

## 2021-02-12 ENCOUNTER — Encounter: Payer: Self-pay | Admitting: Oncology

## 2021-02-12 ENCOUNTER — Other Ambulatory Visit: Payer: Self-pay | Admitting: Hematology and Oncology

## 2021-02-12 VITALS — BP 124/70 | HR 70 | Temp 98.1°F | Resp 20 | Ht 61.0 in | Wt 130.4 lb

## 2021-02-12 DIAGNOSIS — Z8042 Family history of malignant neoplasm of prostate: Secondary | ICD-10-CM | POA: Insufficient documentation

## 2021-02-12 DIAGNOSIS — Z79899 Other long term (current) drug therapy: Secondary | ICD-10-CM | POA: Insufficient documentation

## 2021-02-12 DIAGNOSIS — Z800 Family history of malignant neoplasm of digestive organs: Secondary | ICD-10-CM | POA: Diagnosis not present

## 2021-02-12 DIAGNOSIS — F17210 Nicotine dependence, cigarettes, uncomplicated: Secondary | ICD-10-CM

## 2021-02-12 DIAGNOSIS — Z808 Family history of malignant neoplasm of other organs or systems: Secondary | ICD-10-CM

## 2021-02-12 DIAGNOSIS — Z90722 Acquired absence of ovaries, bilateral: Secondary | ICD-10-CM | POA: Insufficient documentation

## 2021-02-12 DIAGNOSIS — F4481 Dissociative identity disorder: Secondary | ICD-10-CM | POA: Diagnosis not present

## 2021-02-12 DIAGNOSIS — C3492 Malignant neoplasm of unspecified part of left bronchus or lung: Secondary | ICD-10-CM

## 2021-02-12 DIAGNOSIS — R911 Solitary pulmonary nodule: Secondary | ICD-10-CM

## 2021-02-12 DIAGNOSIS — J439 Emphysema, unspecified: Secondary | ICD-10-CM

## 2021-02-12 DIAGNOSIS — C3432 Malignant neoplasm of lower lobe, left bronchus or lung: Secondary | ICD-10-CM | POA: Insufficient documentation

## 2021-02-12 DIAGNOSIS — R058 Other specified cough: Secondary | ICD-10-CM | POA: Diagnosis not present

## 2021-02-12 DIAGNOSIS — G479 Sleep disorder, unspecified: Secondary | ICD-10-CM | POA: Diagnosis not present

## 2021-02-12 DIAGNOSIS — R443 Hallucinations, unspecified: Secondary | ICD-10-CM | POA: Insufficient documentation

## 2021-02-12 DIAGNOSIS — Z885 Allergy status to narcotic agent status: Secondary | ICD-10-CM | POA: Diagnosis not present

## 2021-02-12 DIAGNOSIS — R062 Wheezing: Secondary | ICD-10-CM

## 2021-02-12 DIAGNOSIS — I700 Atherosclerosis of aorta: Secondary | ICD-10-CM

## 2021-02-12 DIAGNOSIS — R059 Cough, unspecified: Secondary | ICD-10-CM

## 2021-02-12 DIAGNOSIS — F1290 Cannabis use, unspecified, uncomplicated: Secondary | ICD-10-CM | POA: Diagnosis not present

## 2021-02-12 LAB — COMPREHENSIVE METABOLIC PANEL
Albumin: 4.4 (ref 3.5–5.0)
Calcium: 10.1 (ref 8.7–10.7)

## 2021-02-12 LAB — CBC AND DIFFERENTIAL
HCT: 42 (ref 36–46)
Hemoglobin: 14.2 (ref 12.0–16.0)
Neutrophils Absolute: 5.92
Platelets: 347 (ref 150–399)
WBC: 11.6

## 2021-02-12 LAB — HEPATIC FUNCTION PANEL
ALT: 21 (ref 7–35)
AST: 23 (ref 13–35)
Alkaline Phosphatase: 74 (ref 25–125)
Bilirubin, Total: 1.3

## 2021-02-12 LAB — BASIC METABOLIC PANEL
BUN: 11 (ref 4–21)
CO2: 30 — AB (ref 13–22)
Chloride: 105 (ref 99–108)
Creatinine: 0.9 (ref 0.5–1.1)
Glucose: 107
Potassium: 3.9 (ref 3.4–5.3)
Sodium: 140 (ref 137–147)

## 2021-02-12 LAB — CBC: RBC: 4.33 (ref 3.87–5.11)

## 2021-02-14 ENCOUNTER — Telehealth: Payer: Self-pay

## 2021-02-14 LAB — CEA: CEA: 5.1 ng/mL — ABNORMAL HIGH (ref 0.0–4.7)

## 2021-02-14 NOTE — Telephone Encounter (Signed)
-----   Message from Derwood Kaplan, MD sent at 02/12/2021  4:42 PM EST ----- Regarding: referral Pls refer to cardiothoracic surgeon in [facility] or [facility] for stage I lung cancer, 2nd opinion regarding resection

## 2021-02-14 NOTE — Telephone Encounter (Signed)
Referral sent to Dr. Druscilla Brownie in [facility].

## 2021-02-23 ENCOUNTER — Other Ambulatory Visit: Payer: Self-pay | Admitting: Hematology and Oncology

## 2021-02-23 MED ORDER — TRAMADOL HCL 50 MG PO TABS: 50.0000 mg | ORAL_TABLET | Freq: Four times a day (QID) | ORAL | 0 refills | Status: DC | PRN

## 2021-03-06 ENCOUNTER — Inpatient Hospital Stay (INDEPENDENT_AMBULATORY_CARE_PROVIDER_SITE_OTHER): Payer: Medicare Other | Admitting: Hematology and Oncology

## 2021-03-06 ENCOUNTER — Inpatient Hospital Stay: Payer: Medicare Other | Attending: Genetic Counselor

## 2021-03-06 ENCOUNTER — Other Ambulatory Visit: Payer: Self-pay

## 2021-03-06 ENCOUNTER — Other Ambulatory Visit: Payer: Self-pay | Admitting: Hematology and Oncology

## 2021-03-06 ENCOUNTER — Encounter: Payer: Self-pay | Admitting: Hematology and Oncology

## 2021-03-06 DIAGNOSIS — C3492 Malignant neoplasm of unspecified part of left bronchus or lung: Secondary | ICD-10-CM

## 2021-03-06 LAB — BASIC METABOLIC PANEL
BUN: 14 (ref 4–21)
CO2: 31 — AB (ref 13–22)
Chloride: 105 (ref 99–108)
Creatinine: 0.8 (ref 0.5–1.1)
Glucose: 111
Potassium: 3.7 (ref 3.4–5.3)
Sodium: 142 (ref 137–147)

## 2021-03-06 LAB — HEPATIC FUNCTION PANEL
ALT: 19 (ref 7–35)
AST: 25 (ref 13–35)
Alkaline Phosphatase: 77 (ref 25–125)
Bilirubin, Total: 1.1

## 2021-03-06 LAB — CBC AND DIFFERENTIAL
HCT: 41 (ref 36–46)
Hemoglobin: 14.5 (ref 12.0–16.0)
Neutrophils Absolute: 5.4
Platelets: 312 (ref 150–399)
WBC: 10.8

## 2021-03-06 LAB — COMPREHENSIVE METABOLIC PANEL
Albumin: 4.2 (ref 3.5–5.0)
Calcium: 9.4 (ref 8.7–10.7)

## 2021-03-06 LAB — CBC: RBC: 4.27 (ref 3.87–5.11)

## 2021-03-06 MED ORDER — ONDANSETRON HCL 4 MG PO TABS
4.0000 mg | ORAL_TABLET | ORAL | 3 refills | Status: DC | PRN
Start: 2021-03-06 — End: 2021-11-08

## 2021-03-06 NOTE — Progress Notes (Signed)
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 This was initially found on lung cancer screening CT in September 2022. Most recent CT imaging revealed the solitary left lower lobe nodule to be enlarging, measuring 1.2 cm in September and now measuring 1.4 x 1.9 cm. She was deemed a candidate for resection and met with Dr. Kipp Hunt for consultation. As she is not motivated to quit smoking, he felt that surgery would present an excessive risk for postoperative respiratory complications. He recommended SBRT as a good option if the patient was unable to quit smoking. She has requested records to be forwarded to Dr. Richard Hunt in [place] who has agreed to her surgical management. Her daughter lives in [place] and will be her support system. She would like to return for follow up care once she has recovered from surgery.     The patient understands the plans discussed today and is in agreement with them.  She knows to contact our office if she develops concerns prior to her next appointment.    Sydney Ped, NP  [facility] [address] [place] [address] Dept: [phone] Dept Fax: [phone]   Orders Placed This Encounter  Procedures   CBC and differential    This external order was created through the Results Console.   CBC    This external order was created through the Results Console.      CHIEF COMPLAINT:  CC: A 61 year old female with history of stage 1 lung cancer here for 2 week evaluation  Current Treatment:  Diagnostic  INTERVAL HISTORY:  Sydney Hunt is here today for repeat clinical assessment. She denies fevers or chills. She denies pain. Her  appetite is good. Her weight has been stable.  I have reviewed the past medical history, past surgical history, social history and family history with the patient and they are unchanged from previous note.  ALLERGIES:  is allergic to codeine, demerol [meperidine hcl], other, silver, gold bond [menthol-zinc oxide], and neosporin [bacitracin-polymyxin b].  MEDICATIONS:  Current Outpatient Medications  Medication Sig Dispense Refill   ondansetron (ZOFRAN) 4 MG tablet Take 1 tablet (4 mg total) by mouth every 4 (four) hours as needed for nausea. 90 tablet 3   acetaminophen (TYLENOL) 500 MG tablet Take 1,000 mg by mouth daily.     albuterol (VENTOLIN HFA) 108 (90 Base) MCG/ACT inhaler Inhale 2 puffs into the lungs every 4 (four) hours as needed for wheezing or shortness of breath.     amitriptyline (ELAVIL) 10 MG tablet Take 10 mg by mouth at bedtime as needed for sleep.     aspirin 81 MG EC tablet Take 81 mg by mouth daily.     b complex vitamins capsule Take 1 capsule by mouth daily.     celecoxib (CELEBREX) 100 MG capsule      hydrochlorothiazide (HYDRODIURIL) 25 MG tablet Take 25 mg by mouth daily.     Tiotropium Bromide Monohydrate (SPIRIVA RESPIMAT) 1.25 MCG/ACT AERS Inhale 2 puffs into the lungs at bedtime.     traMADol (ULTRAM) 50 MG tablet Take 1 tablet (50 mg total) by mouth every 6 (six) hours as needed. 30 tablet 0   No current  facility-administered medications for this visit.    HISTORY OF PRESENT ILLNESS:   Oncology History  Non-small cell lung cancer, left ([place])  01/15/2021 Initial Diagnosis   Non-small cell lung cancer, left ([place])   02/12/2021 Cancer Staging   Staging form: Lung, AJCC 8th Edition - Clinical stage from 02/12/2021: Stage IA2 (cT1b, cN0, cM0) - Signed by Sydney Kaplan, MD on 02/14/2021 Histopathologic type: Adenocarcinoma, NOS Stage prefix: Initial diagnosis Histologic grade (G): GX Histologic grading system: 4 grade system Laterality: Left Tumor  size (mm): 19 Lymph-vascular invasion (LVI): LVI not present (absent)/not identified Diagnostic confirmation: Positive histology Specimen type: Bronchial Biopsy Staged by: Managing physician Stage used in treatment planning: Yes National guidelines used in treatment planning: Yes Type of national guideline used in treatment planning: NCCN Staging comments: Rec surgical resection        REVIEW OF SYSTEMS:   Constitutional: Denies fevers, chills or abnormal weight loss Eyes: Denies blurriness of vision Ears, nose, mouth, throat, and face: Denies mucositis or sore throat Respiratory: Denies cough, dyspnea or wheezes Cardiovascular: Denies palpitation, chest discomfort or lower extremity swelling Gastrointestinal:  Denies nausea, heartburn or change in bowel habits Skin: Denies abnormal skin rashes Lymphatics: Denies new lymphadenopathy or easy bruising Neurological:Denies numbness, tingling or new weaknesses Behavioral/Psych: Mood is stable, no new changes  All other systems were reviewed with the patient and are negative.   VITALS:  Blood pressure 138/77, pulse 74, temperature 99 F (37.2 C), temperature source Oral, resp. rate 18, height _0  (1.549 m), weight 130 lb (59 kg), SpO2 96 %.  Wt Readings from Last 3 Encounters:  03/06/21 130 lb (59 kg)  02/12/21 130 lb 6.4 oz (59.1 kg)  01/15/21 129 lb (58.5 kg)    Body mass index is 24.56 kg/m.  Performance status (ECOG): 1 - Symptomatic but completely ambulatory  PHYSICAL EXAM:   GENERAL:alert, no distress and comfortable SKIN: skin color, texture, turgor are normal, no rashes or significant lesions EYES: normal, Conjunctiva are pink and non-injected, sclera clear OROPHARYNX:no exudate, no erythema and lips, buccal mucosa, and tongue normal  NECK: supple, thyroid normal size, non-tender, without nodularity LYMPH:  no palpable lymphadenopathy in the cervical, axillary or inguinal LUNGS: clear to auscultation and  percussion with normal breathing effort HEART: regular rate & rhythm and no murmurs and no lower extremity edema ABDOMEN:abdomen soft, non-tender and normal bowel sounds Musculoskeletal:no cyanosis of digits and no clubbing  NEURO: alert & oriented x 3 with fluent speech, no focal motor/sensory deficits  LABORATORY DATA:  I have reviewed the data as listed    Component Value Date/Time   NA 142 03/06/2021 0000   K 3.7 03/06/2021 0000   CL 105 03/06/2021 0000   CO2 31 (A) 03/06/2021 0000   GLUCOSE 100 (H) 01/15/2021 0822   BUN 14 03/06/2021 0000   CREATININE 0.8 03/06/2021 0000   CREATININE 0.83 01/15/2021 0822   CALCIUM 9.4 03/06/2021 0000   PROT 7.2 01/15/2021 0822   ALBUMIN 4.2 03/06/2021 0000   AST 25 03/06/2021 0000   ALT 19 03/06/2021 0000   ALKPHOS 77 03/06/2021 0000   BILITOT 1.4 (H) 01/15/2021 0822   GFRNONAA >60 01/15/2021 0822    No results found for: SPEP, UPEP  Lab Results  Component Value Date   WBC 10.8 03/06/2021   NEUTROABS 5.40 03/06/2021   HGB 14.5 03/06/2021   HCT 41 03/06/2021   MCV 99.8 01/15/2021   PLT 312 03/06/2021      Chemistry  Component Value Date/Time   NA 142 03/06/2021 0000   K 3.7 03/06/2021 0000   CL 105 03/06/2021 0000   CO2 31 (A) 03/06/2021 0000   BUN 14 03/06/2021 0000   CREATININE 0.8 03/06/2021 0000   CREATININE 0.83 01/15/2021 0822   GLU 111 03/06/2021 0000      Component Value Date/Time   CALCIUM 9.4 03/06/2021 0000   ALKPHOS 77 03/06/2021 0000   AST 25 03/06/2021 0000   ALT 19 03/06/2021 0000   BILITOT 1.4 (H) 01/15/2021 8616       RADIOGRAPHIC STUDIES: I have personally reviewed the radiological images as listed and agreed with the findings in the report. No results found.

## 2021-03-06 NOTE — Assessment & Plan Note (Signed)
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 This was initially found on lung cancer screening CT in September 2022. Most recent CT imaging revealed the solitary left lower lobe nodule to be enlarging, measuring 1.2 cm in September and now measuring 1.4 x 1.9 cm. She was deemed a candidate for resection and met with Dr. Kipp Brood for consultation. As she is not motivated to quit smoking, he felt that surgery would present an excessive risk for postoperative respiratory complications. He recommended SBRT as a good option if the patient was unable to quit smoking. She has requested records to be forwarded to Dr. Richard Miu in [place] who has agreed to her surgical management. Her daughter lives in [place] and will be her support system. She would like to return for follow up care once she has recovered from surgery.

## 2021-03-15 ENCOUNTER — Ambulatory Visit: Payer: Medicare Other | Admitting: Emergency Medicine

## 2021-06-29 ENCOUNTER — Telehealth: Payer: Self-pay

## 2021-06-29 NOTE — Telephone Encounter (Addendum)
06/29/2021  Sydney Hunt, please schedule her an appt w/Dr Hinton Rao. Her contact # [phone]. Pt given appt for July 2023.   06/29/2021 - Pt called to speak with [name],Sydney Hunt. She is s/p lung resection in [place] (where her daughter lives). They have recommended 4 cycles of chemo, then 1 year of immunotherapy. She plans to come back here for treatment and wants to discuss plan with [name],Sydney Hunt. She saw Sydney Hunt here in Feb 2023. I called pt and told her Sydney Hunt is not here today, but I did forward her message to [employer]. I confirmed for pt that we need her medical records faxed to [place] @ [phone] asap. Pt mentioned, "I have refused chemotherapy, esp since I found out I would have to have a port. I will do the immunotherapy if [name],Sydney Hunt, agrees to same treatment plan".

## 2021-08-10 ENCOUNTER — Telehealth: Payer: Self-pay

## 2021-08-10 NOTE — Telephone Encounter (Addendum)
I spoke with pt and notified of her Dr Remi Deter recommendation below. She verbalized, "Ok, I'll call them today".  ----- Message from Derwood Kaplan, MD sent at 08/09/2021  5:53 PM EDT ----- Regarding: RE: Treatment plans Contact: [phone] I think so.  The most benefit is to have it within 90 days of surgery, after that, less proven benefit. ----- Message ----- From: Dairl Ponder, RN Sent: 08/09/2021  11:25 AM EDT To: Derwood Kaplan, MD Subject: Treatment plans                                Pt LVM on nurse line that her car has broken down, so she is unable to come back to [place] to get her port placed and see you. She said it maybe a couple of months before she will have enough money to fix it. She wants to know should she just get the port placed where she is and get treatment there?

## 2021-08-16 ENCOUNTER — Other Ambulatory Visit: Payer: Medicare Other

## 2021-08-16 ENCOUNTER — Ambulatory Visit: Payer: Medicare Other | Admitting: Oncology

## 2021-09-12 ENCOUNTER — Ambulatory Visit: Payer: Medicare HMO | Admitting: Hematology and Oncology

## 2021-09-12 ENCOUNTER — Other Ambulatory Visit: Payer: Medicare HMO

## 2021-11-07 ENCOUNTER — Other Ambulatory Visit: Payer: Self-pay | Admitting: Oncology

## 2021-11-07 DIAGNOSIS — C3492 Malignant neoplasm of unspecified part of left bronchus or lung: Secondary | ICD-10-CM

## 2021-11-07 NOTE — Progress Notes (Signed)
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 This was initially found on lung cancer screening CT in September 2022. Most recent CT imaging revealed the solitary left lower lobe nodule to be enlarging, measuring 1.2 cm in September and now measuring 1.4 x 1.9 cm.  She initially met with Dr. Kipp Brood for consultation, and he recommended SBRT as he felt that her smoking would cause excessive risk for postoperative complications.  She sought another opinion with  Dr. Richard Miu in [place] who agreed to her surgical management. Her daughter lives in [place] and will be her support system.  She has now had her surgery on April 12 with left thoracotomy and left lower lobe lobectomy along with mediastinal lymph node dissection.  Pathology revealed an adenocarcinoma grade 2 measuring 2.5 cm in diameter but with no evidence of visceral pleural invasion or direct invasion of adjacent structures.  All margins are negative for invasive carcinoma.  She did have involvement of 2 out of 20 lymph nodes for a T1c N1 M0, stage IIB.  EGFR and ALK mutations were negative but PD-L1 was 90%.  Her doctor there recommended chemotherapy with immunotherapy but she refused chemotherapy.  She was only willing to take the immunotherapy for 1 year and had a port placed.  She had her first dose on September 7 and her second dose on September 28.  She has now relocated to [facility] and wishes to continue this regimen, and would be due next week.  Tobacco abuse.  She has not smoked at all since April 12 when she had her surgery.  I praised her for that.  3.     Dissociative identity disorder with 3 personalities. She continues to follow with a psychiatrist.   4.     Hypokalemia.  I will place her on 20  mEq to take once daily and have given her a list of potassium containing foods to include in her diet.   This is a pleasant 61 year old female with stage IIB adenocarcinoma of the left lung.  She has now had surgical resection but did have 2 positive nodes for a T1c N1 M0.  It was therefore recommended that she have chemotherapy and immunotherapy but she has refused chemotherapy.  Fortunately her PD-L1 testing was 90% and so she should do well with the pembrolizumab, and I feel this is a reasonable choice.  This has already been started and we will plan to continue for a full year.  She will be due next week and so we will make arrangements but will not be able to get her on the schedule until October 23, so she will have to have repeat CBC and CMP next week, which will allow [place] to recheck her potassium.  She will then continue a 3-week schedule and I will plan to see her every 3 weeks.  She is also due for a PET scan since it has been 6 months since her surgery.  I will place her on oral potassium supplement and encouraged her to include potassium in her diet.  I will prescribe Emla cream for her port.  I will refill her ondansetron antiemetic.  Later we will need to arrange for annual mammography and colonoscopy when she is due since she  does have a history of colon polyps she understands and agrees with this plan of care. I have answered their questions and know to call with any concerns.   I provided 35 minutes of face-to-face time during this this encounter and > 50% was spent counseling as documented under my assessment and plan.    Derwood Kaplan, MD [facility] [address] [place] [address] Dept: [phone] Dept Fax: [phone]    CHIEF COMPLAINT:  CC: Adenocarcinoma of the left lung  Current Treatment:  Referral for 2nd opinion on surgical resection   HISTORY OF PRESENT ILLNESS:  Sydney Hunt is a 61 y.o.  female with a history of tobacco abuse who has adenocarcinoma of the left lung. This was originally found on lung cancer screening CT from September 2022. PET imaging was obtained in October which revealed a solitary 1.25 cm left lower lobe pulmonary nodule to be hypermetabolic and consistent with small primary lung neoplasm. No findings for mediastinal or hilar adenopathy or metastatic disease elsewhere was observed. She met with the genetic counselor and [employer] diagnostic testing was negative for any clinically significant mutations. CT chest from December revealed enlarging left lower lobe nodule, measuring 1.4 x 1.9 cm. The additional smaller pulmonary nodules remained unchanged. She was referred to Dr. Baltazar Apo and underwent bronchoscopy on December 19th and cytology from this procedure confirmed malignant cells consistent with adenocarcioma. Immunohistochemical stains were positive for TTF-1, and negative for p63 and CK5/6. 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 Her FEV1 was 1.98, which is 88% of predicted and her FVC was 2.58, which is 89% of predicted. She was deemed a candidate for resection and met with Dr. Kipp Brood for consultation. As she is not motivated to quit smoking, he felt that surgery would present an excessive risk for postoperative respiratory complications. He recommended SBRT as a good option if the patient was unable to quit smoking.  The patient went for a second opinion to Dr. Asencion Gowda and surgical resection was carried out on April 12 with a left lower lobe lobectomy.  She was hospitalized for 13 days and has not smoked since that time.  Pathology revealed a 2.5 cm adenocarcinoma, grade 2.  There was no evidence of pleural invasion and margins are clear but she tells me 2 of 20 nodes were positive, for a T1c N1 M0, stage IIB.  A port was placed on August 16 and she was started on pembrolizumab on September 7 and had her second dose on September  28.  INTERVAL HISTORY:  I have reviewed her chart and materials related to her cancer extensively and collaborated history with the patient. Summary of oncologic history is as follows: Oncology History  Non-small cell lung cancer, left ([place])  01/15/2021 Initial Diagnosis   Non-small cell lung cancer, left ([place])   02/12/2021 Cancer Staging   Staging form: Lung, AJCC 8th Edition - Clinical stage from 02/12/2021: Stage IA2 (cT1b, cN0, cM0) - Signed by Derwood Kaplan, MD on 02/14/2021 Histopathologic type: Adenocarcinoma, NOS Stage prefix: Initial diagnosis Histologic grade (G): GX Histologic grading system: 4 grade system Laterality: Left Tumor size (mm): 19 Lymph-vascular invasion (LVI): LVI not present (absent)/not identified Diagnostic confirmation: Positive histology Specimen type: Bronchial Biopsy Staged by: Managing physician Stage used in treatment planning: Yes National guidelines used in treatment planning: Yes Type of national guideline used in treatment planning: NCCN Staging comments: Rec surgical resection   05/11/2021  Cancer Staging   Staging form: Lung, AJCC 8th Edition - Pathologic stage from 05/11/2021: Stage IIB (ypT1c, pN1, cM0) - Signed by Derwood Kaplan, MD on 11/12/2021 Histopathologic type: Adenocarcinoma, NOS Stage prefix: Post-therapy Histologic grade (G): G2 Histologic grading system: 4 grade system Residual tumor (R): R0 - None Laterality: Left Tumor size (mm): 25 Lymph-vascular invasion (LVI): LVI not present (absent)/not identified Diagnostic confirmation: Positive histology PLUS positive immunophenotyping and/or positive genetic studies Specimen type: Excision Staged by: Managing physician Type of lung cancer: Surgically resected non-small cell lung cancer ECOG performance status: Grade 1 Perineural invasion (PNI): Absent Pleural/elastic layer invasion: PL0 Pleural lavage cytology: Unknown Weight loss: Absent Adequacy of mediastinal  dissection: Adequate Radiotherapy dose: No Adjuvant radiation: No Adjuvant chemotherapy: Yes Stage used in treatment planning: Yes National guidelines used in treatment planning: Yes Type of national guideline used in treatment planning: NCCN Staging comments: Adjuvant pembrolizumab, PDL 1 90%   10/04/2021 -  Chemotherapy   Patient is on Treatment Plan : LUNG NSCLC Pembrolizumab (200) q21d     Interval History  Sydney Hunt returns from [place] where she was staying with her daughter as she went through her perioperative period.  She had surgery on April 12 with a left lower lobectomy for a 2.5 cm grade 2 adenocarcinoma with 2 of 20 nodes positive, stage IIb.  She was hospitalized for 13 days and has not smoked since that time.  It was recommended that she have chemotherapy and immunotherapy but she declined the chemotherapy.  [facility] testing for EGFR and ALK were negative but PD-L1 was 90%.  She did agree to pembrolizumab therapy and started that on September 7 with her second dose on September 28.  The port was placed on August 16.  She has had occasional nausea and alternating constipation and diarrhea.  Overall she has done well with this.  She has some mild cough but denies hemoptysis.  Her appetite is not the best but her weight is stable.  Her left side is sore and tender where she had the thoracotomy and the drainage tubes.  She is unable to wear a bra.  It has been 6 months since her surgery and so she is due for a PET scan and we will see if we can schedule that for this month.  She is taking gabapentin at bedtime.  She also has borderline osteoporosis and is on calcium supplement 1 daily.  She is due for annual mammography and routine colonoscopy as she has had colon polyps and we will plan to make those arrangements after she has stabilized.  Her labs show a normal CBC but her CMP reveals a potassium of 3.1 and calcium of 10.3 with a total bilirubin of 1.4.  The rest is normal.  I will place her on  an oral potassium supplement. She denies fever, chills or other signs of infection.  She denies vomiting or abdominal pain.  She denies sore throat, dyspnea, or chest pain.  HISTORY:   Past Medical History:  Diagnosis Date   Allergy    Arthritis    Complication of anesthesia    slow to wake up   COPD (chronic obstructive pulmonary disease) (HCC)    GERD (gastroesophageal reflux disease)    Hypertension    Migraines     Past Surgical History:  Procedure Laterality Date   ABDOMINAL HYSTERECTOMY  1989   unsure of vaginal or abdominal   BRONCHIAL BIOPSY  01/15/2021   Procedure: BRONCHIAL BIOPSIES;  Surgeon: Baltazar Apo  S, MD;  Location: [facility];  Service: Pulmonary;;   BRONCHIAL BRUSHINGS  01/15/2021   Procedure: BRONCHIAL BRUSHINGS;  Surgeon: Collene Gobble, MD;  Location: [facility] ENDOSCOPY;  Service: Pulmonary;;   BRONCHIAL NEEDLE ASPIRATION BIOPSY  01/15/2021   Procedure: BRONCHIAL NEEDLE ASPIRATION BIOPSIES;  Surgeon: Collene Gobble, MD;  Location[facility] ENDOSCOPY;  Service: Pulmonary;;   COLONOSCOPY  2022   FIDUCIAL MARKER PLACEMENT  01/15/2021   Procedure: FIDUCIAL MARKER PLACEMENT;  Surgeon: Collene Gobble, MD;  Location: [facility] ENDOSCOPY;  Service: Pulmonary;;   ovaries removed  1986   surgery to remove scar tissue     from colon/bladder in patient's late 30's   TUBAL LIGATION  1986   UPPER GI ENDOSCOPY     years ago in patient's late 34s   VIDEO BRONCHOSCOPY WITH RADIAL ENDOBRONCHIAL ULTRASOUND  01/15/2021   Procedure: [place];  Surgeon: Collene Gobble, MD;  Location: [facility] ENDOSCOPY;  Service: Pulmonary;;    Family History  Problem Relation Age of Onset   Pancreatic cancer Maternal Aunt 31   Brain cancer Maternal Uncle    Stomach cancer Maternal Uncle        dx. >50   Prostate cancer Half-Brother        passed away at 52   Prostate cancer Half-Brother     Social History:  reports that she has been smoking cigarettes.  She started smoking about 60 years ago. She has a 29.50 pack-year smoking history. She has never used smokeless tobacco. She reports current alcohol use of about 4.0 standard drinks of alcohol per week. She reports current drug use. Frequency: 3.00 times per week. Drug: Marijuana. She is widowed and lives at home.  She has quit smoking since April 12, when she had her surgical resection.  She has 2 biological children and 1 stepchild. She is disabled, and has been exposed to chemicals or other toxic agents. She was previously employed at a hosiery, chicken farm and cleaning houses. 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  Allergies:  Allergies  Allergen Reactions   Codeine Rash   Demerol [Meperidine Hcl] Other (See Comments)    coma   Other Dermatitis    Stainless steel / skin weeps   Silver Dermatitis    Sterling silver skin weeps   Gold Bond [Menthol-Zinc Oxide] Dermatitis    Skin weeps   Neosporin [Bacitracin-Polymyxin B] Dermatitis    Skin weeps    Current Medications: Current Outpatient Medications  Medication Sig Dispense Refill   albuterol (VENTOLIN HFA) 108 (90 Base) MCG/ACT inhaler Inhale 2 puffs into the lungs every 4 (four) hours as needed for wheezing or shortness of breath.     amitriptyline (ELAVIL) 10 MG tablet Take 10 mg by mouth at bedtime as needed for sleep.     Ascorbic Acid (VITAMIN C WITH ROSE HIPS) 500 MG tablet Take 500 mg by mouth daily.     aspirin 81 MG EC tablet Take 81 mg by mouth daily.     atorvastatin (LIPITOR) 40 MG tablet Take 40 mg by mouth daily.     b complex vitamins capsule Take 1 capsule by mouth daily.     Biotin 5000 MCG CAPS Take by mouth.     Calcium-Vitamin D-Vitamin K (CHEWABLE CALCIUM PO) Take by mouth. 650 mg calcium chocolate chews     celecoxib (CELEBREX) 100 MG capsule      gabapentin (NEURONTIN) 300 MG capsule Take 300 mg  by mouth at bedtime.     HYDROcodone-acetaminophen (NORCO/VICODIN) 5-325 MG tablet  Take 1 tablet by mouth every 6 (six) hours as needed.     ondansetron (ZOFRAN) 4 MG tablet Take 1 tablet (4 mg total) by mouth every 4 (four) hours as needed for nausea. 90 tablet 3   potassium chloride SA (KLOR-CON M) 20 MEQ tablet Take 20 mEq by mouth daily.     vitamin E 180 MG (400 UNITS) capsule Take 400 Units by mouth daily.     lidocaine-prilocaine (EMLA) cream Apply 1 Application topically as needed. 30 g 0   No current facility-administered medications for this visit.    REVIEW OF SYSTEMS:  Review of Systems  Constitutional: Negative.  Negative for appetite change, chills, fatigue, fever and unexpected weight change.  HENT:  Negative.    Eyes: Negative.   Respiratory:  Negative for chest tightness, cough (occasionally productive with clear sputum), hemoptysis and shortness of breath. Wheezing: occasional.  Cardiovascular: Negative.  Negative for chest pain, leg swelling and palpitations.  Gastrointestinal: Negative.  Negative for abdominal distention, abdominal pain, blood in stool, constipation, diarrhea, nausea and vomiting.  Endocrine: Negative.   Genitourinary: Negative.  Negative for difficulty urinating, dysuria, frequency and hematuria.   Musculoskeletal: Negative.  Negative for arthralgias, back pain, flank pain, gait problem and myalgias.  Skin: Negative.   Neurological: Negative.  Negative for dizziness, extremity weakness, gait problem, headaches, light-headedness, numbness, seizures and speech difficulty.  Hematological: Negative.   Psychiatric/Behavioral: Negative.  Negative for depression and sleep disturbance. The patient is not nervous/anxious.        Dissociative identity disorder     VITALS:  Blood pressure 126/75, pulse 78, temperature 98.4 F (36.9 C), temperature source Oral, resp. rate 17, height $RemoveBe'5\' 1"'[id number]$  (1.549 m), weight 130 lb 1.6 oz (59 kg), SpO2 97 %.  Wt Readings from Last 3 Encounters:  11/08/21 130 lb 1.6 oz (59 kg)  03/06/21 130 lb (59 kg)   02/12/21 130 lb 6.4 oz (59.1 kg)    Body mass index is 24.58 kg/m.  Performance status (ECOG): 1 - Symptomatic but completely ambulatory  PHYSICAL EXAM:  Physical Exam Constitutional:      General: She is not in acute distress.    Appearance: Normal appearance. She is normal weight.  HENT:     Head: Normocephalic and atraumatic.  Eyes:     General: No scleral icterus.    Extraocular Movements: Extraocular movements intact.     Conjunctiva/sclera: Conjunctivae normal.     Pupils: Pupils are equal, round, and reactive to light.  Cardiovascular:     Rate and Rhythm: Normal rate and regular rhythm.     Pulses: Normal pulses.     Heart sounds: Normal heart sounds. No murmur heard.    No friction rub. No gallop.  Pulmonary:     Effort: Pulmonary effort is normal. No respiratory distress.     Breath sounds: Normal breath sounds.  Abdominal:     General: Bowel sounds are normal. There is no distension.     Palpations: Abdomen is soft. There is no hepatomegaly, splenomegaly or mass.     Tenderness: There is no abdominal tenderness.  Musculoskeletal:        General: Normal range of motion.     Cervical back: Normal range of motion and neck supple.     Right lower leg: No edema.     Left lower leg: No edema.  Lymphadenopathy:     Cervical: No cervical  adenopathy.  Skin:    General: Skin is warm and dry.  Neurological:     General: No focal deficit present.     Mental Status: She is alert and oriented to person, place, and time. Mental status is at baseline.  Psychiatric:        Mood and Affect: Mood normal.        Behavior: Behavior normal.        Thought Content: Thought content normal.        Judgment: Judgment normal.     LABS:      Latest Ref Rng & Units 11/08/2021   12:00 AM 03/06/2021   12:00 AM 02/12/2021   12:00 AM  CBC  WBC  11.2     10.8     11.6      Hemoglobin 12.0 - 16.0 12.8     14.5     14.2      Hematocrit 36 - 46 37     41     42      Platelets 150 -  400 K/uL 363     312     347         This result is from an external source.      Latest Ref Rng & Units 11/08/2021   12:00 AM 03/06/2021   12:00 AM 02/12/2021   12:00 AM  CMP  BUN 4 - _0 Creatinine 0.5 - 1.1 0.8     0.8     0.9      Sodium 137 - 147 138     142     140      Potassium 3.5 - 5.1 mEq/L 3.1     3.7     3.9      Chloride 99 - 108 100     105     105      CO2 13 - 22 34     31     30      Calcium 8.7 - 10.7 10.3     9.4     10.1      Alkaline Phos 25 - 125 83     77     74      AST 13 - 35 _1 ALT 7 - 35 U/L _2 This result is from an external source.     Lab Results  Component Value Date   CEA1 2.8 11/08/2021   /  CEA  Date Value Ref Range Status  11/08/2021 2.8 0.0 - 4.7 ng/mL Final    Comment:    (NOTE)                             Nonsmokers          <3.9                             Smokers             <5.6 Roche [employer] Electrochemiluminescence Immunoassay (ECLIA) Values obtained with different assay methods or kits cannot be used interchangeably.  Results cannot be interpreted as  absolute evidence of the presence or absence of malignant disease. Performed At: [facility] [place], [place] [id number] Rush Farmer MD SF:[id number]      STUDIES:  No results found.    EXAM: 10/13/2020 CT CHEST WITHOUT CONTRAST LOW-DOSE FOR LUNG CANCER SCREENING   TECHNIQUE:  Multidetector CT imaging of the chest was performed following the  standard protocol without IV contrast.   COMPARISON: None.   FINDINGS:  Cardiovascular: Atherosclerotic calcification of the aorta. Heart  size normal. No pericardial effusion.  Mediastinum/Nodes: No pathologically enlarged mediastinal or  axillary lymph nodes. Hilar regions are difficult to definitively  evaluate without IV contrast but appear grossly unremarkable. There  may be distal esophageal wall thickening which can be seen  with  gastroesophageal reflux.  Lungs/Pleura: Mild biapical pleuroparenchymal scarring.  Centrilobular and paraseptal emphysema. Smoking related respiratory  bronchiolitis. 11.2 mm peripheral left lower lobe nodule (4/178) has  somewhat lobulated borders. Other pulmonary nodules measure 5.3 mm  or less in size. No pleural fluid. Airway is unremarkable.  Upper Abdomen: Visualized portions of the liver, gallbladder,  adrenal glands, kidneys, spleen, pancreas, stomach and bowel are  grossly unremarkable.  Musculoskeletal: No worrisome lytic or sclerotic lesions.   IMPRESSION:  1. Lung-RADS 4A, suspicious. Follow up low-dose chest CT without  contrast in 3 months (please use the following order, "CT CHEST LCS  NODULE FOLLOW-UP W/O CM") is recommended. Alternatively, PET may be  considered when there is a solid component 52m or larger. 11.2 mm  peripheral left lower lobe nodule. These results will be called to  the ordering clinician or representative by the Radiologist  Assistant, and communication documented in the PACS or [employer]  2. Aortic atherosclerosis (ICD10-I70.0).  3. Emphysema (ICD10-J43.9).   EXAM: 11/01/2020 NUCLEAR MEDICINE PET SKULL BASE TO THIGH   TECHNIQUE: 6.51 mCi F-18 FDG was injected intravenously. Full-ring PET imaging was performed from the skull base to thigh after the radiotracer. CT data was obtained and used for attenuation correction and anatomic localization.   Fasting blood glucose: 103 mg/dl   COMPARISON:  Chest CT 10/13/2020   FINDINGS: Mediastinal blood pool activity: SUV max 2.13   Liver activity: SUV max NA   NECK: No hypermetabolic lymph nodes in the neck.   Incidental CT findings: none   CHEST: 12.5 mm lobulated left lower lobe subpleural pulmonary nodule is hypermetabolic with SUV max of 53.43and consistent with primary lung neoplasm.   No enlarged or hypermetabolic mediastinal or hilar lymph nodes to suggest metastatic  adenopathy. No supraclavicular or axillary adenopathy.   No other pulmonary lesions.   Incidental CT findings: Stable underlying emphysematous changes and stable vascular calcifications.   ABDOMEN/PELVIS: No abnormal hypermetabolic activity within the liver, pancreas, adrenal glands, or spleen. No hypermetabolic lymph nodes in the abdomen or pelvis.   Incidental CT findings: Scattered atherosclerotic calcifications involving the aorta and iliac arteries.   SKELETON: No significant bony findings. No evidence of metastatic disease.   Incidental CT findings: none   IMPRESSION: 1. Solitary 12.5 mm left lower lobe pulmonary nodule is hypermetabolic and consistent with small primary lung neoplasm. 2. No findings for mediastinal or hilar adenopathy or metastatic disease elsewhere.  FINAL MICROSCOPIC DIAGNOSIS: 01/15/2021  A. LUNG, LLL, BRUSH:  - Atypical cells present   B. LUNG, LLL, FINE NEEDLE ASPIRATION:  - Malignant cells consistent with adenocarcinoma, see comment   COMMENT:   B.   Immunohistochemical stains show that the tumor cells are positive  for TTF-1 while they are negative for p63 and CK5/6, consistent with  above interpretation.  Dr. Saralyn Pilar reviewed the case and concurs with  the diagnosis.  Dr. Lamonte Sakai was notified on 01/19/2021.

## 2021-11-08 ENCOUNTER — Inpatient Hospital Stay: Payer: Medicare HMO | Attending: Oncology | Admitting: Oncology

## 2021-11-08 ENCOUNTER — Inpatient Hospital Stay: Payer: Medicare HMO

## 2021-11-08 ENCOUNTER — Encounter: Payer: Self-pay | Admitting: Oncology

## 2021-11-08 ENCOUNTER — Other Ambulatory Visit: Payer: Self-pay | Admitting: Oncology

## 2021-11-08 VITALS — BP 126/75 | HR 78 | Temp 98.4°F | Resp 17 | Ht 61.0 in | Wt 130.1 lb

## 2021-11-08 DIAGNOSIS — F17210 Nicotine dependence, cigarettes, uncomplicated: Secondary | ICD-10-CM | POA: Diagnosis not present

## 2021-11-08 DIAGNOSIS — Z808 Family history of malignant neoplasm of other organs or systems: Secondary | ICD-10-CM | POA: Diagnosis not present

## 2021-11-08 DIAGNOSIS — Z8042 Family history of malignant neoplasm of prostate: Secondary | ICD-10-CM | POA: Insufficient documentation

## 2021-11-08 DIAGNOSIS — Z79899 Other long term (current) drug therapy: Secondary | ICD-10-CM | POA: Diagnosis not present

## 2021-11-08 DIAGNOSIS — F1290 Cannabis use, unspecified, uncomplicated: Secondary | ICD-10-CM | POA: Diagnosis not present

## 2021-11-08 DIAGNOSIS — Z8719 Personal history of other diseases of the digestive system: Secondary | ICD-10-CM | POA: Diagnosis not present

## 2021-11-08 DIAGNOSIS — C3492 Malignant neoplasm of unspecified part of left bronchus or lung: Secondary | ICD-10-CM | POA: Diagnosis not present

## 2021-11-08 DIAGNOSIS — E876 Hypokalemia: Secondary | ICD-10-CM | POA: Diagnosis not present

## 2021-11-08 DIAGNOSIS — J439 Emphysema, unspecified: Secondary | ICD-10-CM | POA: Insufficient documentation

## 2021-11-08 DIAGNOSIS — Z5112 Encounter for antineoplastic immunotherapy: Secondary | ICD-10-CM | POA: Diagnosis not present

## 2021-11-08 DIAGNOSIS — F4481 Dissociative identity disorder: Secondary | ICD-10-CM | POA: Diagnosis not present

## 2021-11-08 DIAGNOSIS — I700 Atherosclerosis of aorta: Secondary | ICD-10-CM | POA: Insufficient documentation

## 2021-11-08 DIAGNOSIS — Z90722 Acquired absence of ovaries, bilateral: Secondary | ICD-10-CM | POA: Insufficient documentation

## 2021-11-08 DIAGNOSIS — Z885 Allergy status to narcotic agent status: Secondary | ICD-10-CM | POA: Diagnosis not present

## 2021-11-08 DIAGNOSIS — C3432 Malignant neoplasm of lower lobe, left bronchus or lung: Secondary | ICD-10-CM | POA: Insufficient documentation

## 2021-11-08 DIAGNOSIS — Z800 Family history of malignant neoplasm of digestive organs: Secondary | ICD-10-CM | POA: Diagnosis not present

## 2021-11-08 LAB — CBC AND DIFFERENTIAL
HCT: 37 (ref 36–46)
Hemoglobin: 12.8 (ref 12.0–16.0)
Neutrophils Absolute: 6.05
Platelets: 363 10*3/uL (ref 150–400)
WBC: 11.2

## 2021-11-08 LAB — BASIC METABOLIC PANEL
BUN: 14 (ref 4–21)
CO2: 34 — AB (ref 13–22)
Chloride: 100 (ref 99–108)
Creatinine: 0.8 (ref 0.5–1.1)
Glucose: 108
Potassium: 3.1 mEq/L — AB (ref 3.5–5.1)
Sodium: 138 (ref 137–147)

## 2021-11-08 LAB — HEPATIC FUNCTION PANEL
ALT: 24 U/L (ref 7–35)
AST: 27 (ref 13–35)
Alkaline Phosphatase: 83 (ref 25–125)
Bilirubin, Total: 1.4

## 2021-11-08 LAB — COMPREHENSIVE METABOLIC PANEL
Albumin: 4.4 (ref 3.5–5.0)
Calcium: 10.3 (ref 8.7–10.7)
eGFR: >60

## 2021-11-08 LAB — CBC: RBC: 4.09 (ref 3.87–5.11)

## 2021-11-08 MED ORDER — LIDOCAINE-PRILOCAINE 2.5-2.5 % EX CREA: 1.0000 | TOPICAL_CREAM | CUTANEOUS | 0 refills | Status: AC | PRN

## 2021-11-08 MED ORDER — ONDANSETRON HCL 4 MG PO TABS: 4.0000 mg | ORAL_TABLET | ORAL | 3 refills | Status: AC | PRN

## 2021-11-09 ENCOUNTER — Other Ambulatory Visit: Payer: Self-pay | Admitting: Pharmacist

## 2021-11-09 DIAGNOSIS — C3492 Malignant neoplasm of unspecified part of left bronchus or lung: Secondary | ICD-10-CM

## 2021-11-09 LAB — CEA: CEA: 2.8 ng/mL (ref 0.0–4.7)

## 2021-11-12 ENCOUNTER — Other Ambulatory Visit: Payer: Self-pay | Admitting: Oncology

## 2021-11-12 ENCOUNTER — Encounter: Payer: Self-pay | Admitting: Oncology

## 2021-11-12 DIAGNOSIS — C3492 Malignant neoplasm of unspecified part of left bronchus or lung: Secondary | ICD-10-CM

## 2021-11-12 NOTE — Progress Notes (Signed)
START OFF PATHWAY REGIMEN - Non-Small Cell Lung   [id number]:Pembrolizumab 200 mg IV D1 q21 Days:   A cycle is every 21 days:     Pembrolizumab   **Always confirm dose/schedule in your pharmacy ordering system**  **Administration Notes: PDL 1 90%,  initiated at another site, pt refuses chemo  Patient Characteristics: Postoperative without Neoadjuvant Therapy (Pathologic Staging), Stage II-III, Adjuvant Chemotherapy, Stage IIB, Nonsquamous Cell Therapeutic Status: Postoperative without Neoadjuvant Therapy (Pathologic Staging) AJCC T Category: pT1c AJCC N Category: pN1 AJCC M Category: cM0 AJCC 8 Stage Grouping: IIB Adjuvant Chemotherapy Status: Adjuvant Chemotherapy ALK Rearrangement Status: Negative EGFR Mutation Status: Negative/Wild Type Histology: Nonsquamous Cell Intent of Therapy: Curative Intent, Discussed with Patient

## 2021-11-13 ENCOUNTER — Other Ambulatory Visit: Payer: Self-pay

## 2021-11-15 ENCOUNTER — Other Ambulatory Visit: Payer: Self-pay | Admitting: Oncology

## 2021-11-15 ENCOUNTER — Telehealth: Payer: Self-pay

## 2021-11-15 DIAGNOSIS — C3492 Malignant neoplasm of unspecified part of left bronchus or lung: Secondary | ICD-10-CM

## 2021-11-15 NOTE — Telephone Encounter (Signed)
Contacted patient regarding message about insurance and scans. Patient voiced her understanding. Also informed me that her insurance might change next month. Patient will let us know if it does.  See message below:  [name], would you tell the pt we will need to do CT chest instead of PET due to insurance. If there is a questionable finding on CT, then can go to PET. Usually don't do while still on treatment, might want to do next year.

## 2021-11-16 MED FILL — Pembrolizumab IV Soln 100 MG/4ML (25 MG/ML): INTRAVENOUS | Qty: 8 | Status: AC

## 2021-11-19 ENCOUNTER — Inpatient Hospital Stay: Payer: Medicare HMO

## 2021-11-19 VITALS — BP 103/72 | HR 71 | Temp 98.5°F | Resp 18 | Ht 61.0 in | Wt 130.8 lb

## 2021-11-19 DIAGNOSIS — C3492 Malignant neoplasm of unspecified part of left bronchus or lung: Secondary | ICD-10-CM

## 2021-11-19 DIAGNOSIS — Z5112 Encounter for antineoplastic immunotherapy: Secondary | ICD-10-CM | POA: Diagnosis not present

## 2021-11-19 LAB — HEPATIC FUNCTION PANEL
ALT: 17 U/L (ref 7–35)
AST: 27 (ref 13–35)
Alkaline Phosphatase: 92 (ref 25–125)
Bilirubin, Total: 1.2

## 2021-11-19 LAB — CBC AND DIFFERENTIAL
HCT: 34 — AB (ref 36–46)
Hemoglobin: 11.7 — AB (ref 12.0–16.0)
Neutrophils Absolute: 5.77
Platelets: 319 10*3/uL (ref 150–400)
WBC: 10.3

## 2021-11-19 LAB — BASIC METABOLIC PANEL
BUN: 14 (ref 4–21)
CO2: 30 — AB (ref 13–22)
Chloride: 101 (ref 99–108)
Creatinine: 0.9 (ref 0.5–1.1)
Glucose: 97
Potassium: 2.8 mEq/L — AB (ref 3.5–5.1)
Sodium: 134 — AB (ref 137–147)

## 2021-11-19 LAB — CBC: RBC: 3.7 — AB (ref 3.87–5.11)

## 2021-11-19 LAB — COMPREHENSIVE METABOLIC PANEL
Albumin: 3.9 (ref 3.5–5.0)
Calcium: 9.6 (ref 8.7–10.7)

## 2021-11-19 LAB — TSH: TSH: 0.016 u[IU]/mL — ABNORMAL LOW (ref 0.350–4.500)

## 2021-11-19 MED ORDER — SODIUM CHLORIDE 0.9 % IV SOLN
200.0000 mg | Freq: Once | INTRAVENOUS | Status: AC
  Administered 2021-11-19: 200 mg via INTRAVENOUS
  Filled 2021-11-19: qty 8

## 2021-11-19 MED ORDER — HEPARIN SOD (PORK) LOCK FLUSH 100 UNIT/ML IV SOLN
500.0000 [IU] | Freq: Once | INTRAVENOUS | Status: AC | PRN
  Administered 2021-11-19: 500 [IU]

## 2021-11-19 MED ORDER — SODIUM CHLORIDE 0.9 % IV SOLN: Freq: Once | INTRAVENOUS | Status: AC

## 2021-11-19 MED ORDER — SODIUM CHLORIDE 0.9% FLUSH
10.0000 mL | INTRAVENOUS | Status: DC | PRN
  Administered 2021-11-19: 10 mL

## 2021-11-19 NOTE — Patient Instructions (Signed)
Pembrolizumab Injection What is this medication? PEMBROLIZUMAB (PEM broe LIZ ue mab) treats some types of cancer. It works by helping your immune system slow or stop the spread of cancer cells. It is a monoclonal antibody. This medicine may be used for other purposes; ask your health care provider or pharmacist if you have questions. COMMON BRAND NAME(S): Keytruda What should I tell my care team before I take this medication? They need to know if you have any of these conditions: Allogeneic stem cell transplant (uses someone else's stem cells) Autoimmune diseases, such as Crohn disease, ulcerative colitis, lupus History of chest radiation Nervous system problems, such as Guillain-Barre syndrome, myasthenia gravis Organ transplant An unusual or allergic reaction to pembrolizumab, other medications, foods, dyes, or preservatives Pregnant or trying to get pregnant Breast-feeding How should I use this medication? This medication is injected into a vein. It is given by your care team in a hospital or clinic setting. A special MedGuide will be given to you before each treatment. Be sure to read this information carefully each time. Talk to your care team about the use of this medication in children. While it may be prescribed for children as young as 6 months for selected conditions, precautions do apply. Overdosage: If you think you have taken too much of this medicine contact a poison control center or emergency room at once. NOTE: This medicine is only for you. Do not share this medicine with others. What if I miss a dose? Keep appointments for follow-up doses. It is important not to miss your dose. Call your care team if you are unable to keep an appointment. What may interact with this medication? Interactions have not been studied. This list may not describe all possible interactions. Give your health care provider a list of all the medicines, herbs, non-prescription drugs, or dietary  supplements you use. Also tell them if you smoke, drink alcohol, or use illegal drugs. Some items may interact with your medicine. What should I watch for while using this medication? Your condition will be monitored carefully while you are receiving this medication. You may need blood work while taking this medication. This medication may cause serious skin reactions. They can happen weeks to months after starting the medication. Contact your care team right away if you notice fevers or flu-like symptoms with a rash. The rash may be red or purple and then turn into blisters or peeling of the skin. You may also notice a red rash with swelling of the face, lips, or lymph nodes in your neck or under your arms. Tell your care team right away if you have any change in your eyesight. Talk to your care team if you may be pregnant. Serious birth defects can occur if you take this medication during pregnancy and for 4 months after the last dose. You will need a negative pregnancy test before starting this medication. Contraception is recommended while taking this medication and for 4 months after the last dose. Your care team can help you find the option that works for you. Do not breastfeed while taking this medication and for 4 months after the last dose. What side effects may I notice from receiving this medication? Side effects that you should report to your care team as soon as possible: Allergic reactions--skin rash, itching, hives, swelling of the face, lips, tongue, or throat Dry cough, shortness of breath or trouble breathing Eye pain, redness, irritation, or discharge with blurry or decreased vision Heart muscle inflammation--unusual weakness or  fatigue, shortness of breath, chest pain, fast or irregular heartbeat, dizziness, swelling of the ankles, feet, or hands Hormone gland problems--headache, sensitivity to light, unusual weakness or fatigue, dizziness, fast or irregular heartbeat, increased  sensitivity to cold or heat, excessive sweating, constipation, hair loss, increased thirst or amount of urine, tremors or shaking, irritability Infusion reactions--chest pain, shortness of breath or trouble breathing, feeling faint or lightheaded Kidney injury (glomerulonephritis)--decrease in the amount of urine, red or dark brown urine, foamy or bubbly urine, swelling of the ankles, hands, or feet Liver injury--right upper belly pain, loss of appetite, nausea, light-colored stool, dark yellow or brown urine, yellowing skin or eyes, unusual weakness or fatigue Pain, tingling, or numbness in the hands or feet, muscle weakness, change in vision, confusion or trouble speaking, loss of balance or coordination, trouble walking, seizures Rash, fever, and swollen lymph nodes Redness, blistering, peeling, or loosening of the skin, including inside the mouth Sudden or severe stomach pain, bloody diarrhea, fever, nausea, vomiting Side effects that usually do not require medical attention (report to your care team if they continue or are bothersome): Bone, joint, or muscle pain Diarrhea Fatigue Loss of appetite Nausea Skin rash This list may not describe all possible side effects. Call your doctor for medical advice about side effects. You may report side effects to FDA at 1-800-FDA-1088. Where should I keep my medication? This medication is given in a hospital or clinic. It will not be stored at home. NOTE: This sheet is a summary. It may not cover all possible information. If you have questions about this medicine, talk to your doctor, pharmacist, or health care provider.  2023 Elsevier/Gold Standard (2021-05-07 00:00:00)    [facility]  Discharge Instructions: Thank you for choosing [facility] to provide your oncology and hematology care.  If you have a lab appointment with the [facility], please go directly to the [facility] and check in at the  registration area.   Wear comfortable clothing and clothing appropriate for easy access to any Portacath or PICC line.   We strive to give you quality time with your provider. You may need to reschedule your appointment if you arrive late (15 or more minutes).  Arriving late affects you and other patients whose appointments are after yours.  Also, if you miss three or more appointments without notifying the office, you may be dismissed from the clinic at the provider's discretion.      For prescription refill requests, have your pharmacy contact our office and allow 72 hours for refills to be completed.    Today you received the following chemotherapy and/or immunotherapy agents pembrolizumab      To help prevent nausea and vomiting after your treatment, we encourage you to take your nausea medication as directed.  BELOW ARE SYMPTOMS THAT SHOULD BE REPORTED IMMEDIATELY: *FEVER GREATER THAN 100.4 F (38 C) OR HIGHER *CHILLS OR SWEATING *NAUSEA AND VOMITING THAT IS NOT CONTROLLED WITH YOUR NAUSEA MEDICATION *UNUSUAL SHORTNESS OF BREATH *UNUSUAL BRUISING OR BLEEDING *URINARY PROBLEMS (pain or burning when urinating, or frequent urination) *BOWEL PROBLEMS (unusual diarrhea, constipation, pain near the anus) TENDERNESS IN MOUTH AND THROAT WITH OR WITHOUT PRESENCE OF ULCERS (sore throat, sores in mouth, or a toothache) UNUSUAL RASH, SWELLING OR PAIN  UNUSUAL VAGINAL DISCHARGE OR ITCHING   Items with * indicate a potential emergency and should be followed up as soon as possible or go to the Emergency Department if any problems should occur.  Please show the CHEMOTHERAPY ALERT CARD or IMMUNOTHERAPY ALERT CARD at check-in to the Emergency Department and triage nurse.  Should you have questions after your visit or need to cancel or reschedule your appointment, please contact [facility]  Dept: [phone]  and follow the prompts.  Office hours are 8:00 a.m. to 4:30 p.m.  Monday - Friday. Please note that voicemails left after 4:00 p.m. may not be returned until the following business day.  We are closed weekends and major holidays. You have access to a nurse at all times for urgent questions. Please call the main number to the clinic Dept: [phone] and follow the prompts.  For any non-urgent questions, you may also contact your provider using MyChart. We now offer e-Visits for anyone 41 and older to request care online for non-urgent symptoms. For details visit [email or web address].   Also download the MyChart app! Go to the app store, search "MyChart", open the app, select [facility], and log in with your MyChart username and password.  Masks are optional in the cancer centers. If you would like for your care team to wear a mask while they are taking care of you, please let them know. You may have one support person who is at least 63 years old accompany you for your appointments.

## 2021-11-20 ENCOUNTER — Other Ambulatory Visit: Payer: Self-pay

## 2021-11-20 LAB — T4: T4, Total: 8.7 ug/dL (ref 4.5–12.0)

## 2021-11-21 ENCOUNTER — Telehealth: Payer: Self-pay

## 2021-11-21 NOTE — Telephone Encounter (Signed)
@   1633 - I notified pt that she should report to the ER for evaluation. She verbalized understanding and said ok.  Manuela Schwartz Phy,RPH: She may need to go to ED.  This probably isn't related to [place].   I called pt to check on her, as she got Keytruda infusion on Monday.  She is having uncontrolled nausea, even with use of Zofran. She is having dry heaves to the point of left side hurting @ rib area. She does not have any compazine. "This treatment is different than any I had before. Is the [place] made by a different company"? She is having terrible heartburn, something she hasn't had in the past either. She states she cant take a deep breath very good. She said it hurts like after her surgery. "I don't want any pain medicine either". I reported above to [facility] and Manuela Schwartz Phy,RPH.

## 2021-11-28 ENCOUNTER — Encounter: Payer: Self-pay | Admitting: Oncology

## 2021-11-28 ENCOUNTER — Other Ambulatory Visit: Payer: Self-pay | Admitting: Oncology

## 2021-11-28 ENCOUNTER — Inpatient Hospital Stay: Payer: Medicare HMO | Attending: Oncology | Admitting: Oncology

## 2021-11-28 VITALS — BP 137/86 | HR 85 | Temp 97.8°F | Resp 21 | Ht 61.0 in | Wt 132.7 lb

## 2021-11-28 DIAGNOSIS — Z8719 Personal history of other diseases of the digestive system: Secondary | ICD-10-CM | POA: Insufficient documentation

## 2021-11-28 DIAGNOSIS — E876 Hypokalemia: Secondary | ICD-10-CM | POA: Insufficient documentation

## 2021-11-28 DIAGNOSIS — F4481 Dissociative identity disorder: Secondary | ICD-10-CM | POA: Insufficient documentation

## 2021-11-28 DIAGNOSIS — K30 Functional dyspepsia: Secondary | ICD-10-CM | POA: Insufficient documentation

## 2021-11-28 DIAGNOSIS — Z885 Allergy status to narcotic agent status: Secondary | ICD-10-CM | POA: Insufficient documentation

## 2021-11-28 DIAGNOSIS — J90 Pleural effusion, not elsewhere classified: Secondary | ICD-10-CM | POA: Insufficient documentation

## 2021-11-28 DIAGNOSIS — F1290 Cannabis use, unspecified, uncomplicated: Secondary | ICD-10-CM | POA: Insufficient documentation

## 2021-11-28 DIAGNOSIS — C3492 Malignant neoplasm of unspecified part of left bronchus or lung: Secondary | ICD-10-CM

## 2021-11-28 DIAGNOSIS — Z90722 Acquired absence of ovaries, bilateral: Secondary | ICD-10-CM | POA: Insufficient documentation

## 2021-11-28 DIAGNOSIS — Z5112 Encounter for antineoplastic immunotherapy: Secondary | ICD-10-CM | POA: Insufficient documentation

## 2021-11-28 DIAGNOSIS — R058 Other specified cough: Secondary | ICD-10-CM | POA: Insufficient documentation

## 2021-11-28 DIAGNOSIS — Z79899 Other long term (current) drug therapy: Secondary | ICD-10-CM | POA: Insufficient documentation

## 2021-11-28 DIAGNOSIS — C3432 Malignant neoplasm of lower lobe, left bronchus or lung: Secondary | ICD-10-CM | POA: Insufficient documentation

## 2021-11-28 DIAGNOSIS — J9589 Other postprocedural complications and disorders of respiratory system, not elsewhere classified: Secondary | ICD-10-CM

## 2021-11-28 MED ORDER — AZITHROMYCIN 500 MG PO TABS: 500.0000 mg | ORAL_TABLET | Freq: Every day | ORAL | 1 refills | Status: DC

## 2021-11-28 NOTE — Progress Notes (Signed)
[facility]  [address] 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 This was initially found on lung cancer screening CT in September 2022. Most recent CT imaging revealed the solitary left lower lobe nodule to be enlarging, measuring 1.2 cm in September and now measuring 1.4 x 1.9 cm.  She initially met with Dr. Kipp Brood for consultation, and he recommended SBRT as he felt that her smoking would cause excessive risk for postoperative complications.  She sought another opinion with  Dr. Richard Miu in [place] who agreed to her surgical management. Her daughter lives in [place] and will be her support system.  She has now had her surgery on April 12 with left thoracotomy and left lower lobe lobectomy along with mediastinal lymph node dissection.  Pathology revealed an adenocarcinoma grade 2 measuring 2.5 cm in diameter but with no evidence of visceral pleural invasion or direct invasion of adjacent structures.  All margins are negative for invasive carcinoma.  She did have involvement of 2 out of 20 lymph nodes for a T1c N1 M0, stage IIB.  EGFR and ALK mutations were negative but PD-L1 was 90%.  Her doctor there recommended chemotherapy with immunotherapy but she refused chemotherapy.  She was only willing to take the immunotherapy for 1 year and had a port placed.  She had her first dose on September 7 and her second dose on September 28.  She has now relocated to [facility] and we have continued this regimen.    Tobacco abuse.  She has not smoked at all since April 12 when she had her surgery.  I praised her for that.  3.     Dissociative identity disorder with 3 personalities. She continues to follow with a psychiatrist.   4.     Hypokalemia.  Potassium was down to 2.8 last week and so she is  on supplement now and we will recheck it next week.  I will place her on 20 mEq to take once daily and have given her a list of potassium containing foods to include in her diet.   This is a pleasant 61 year old female with stage IIB adenocarcinoma of the left lung.  She has now had surgical resection but did have 2 positive nodes for a T1c N1 M0.  It was therefore recommended that she have chemotherapy and immunotherapy but she has refused chemotherapy.  Fortunately her PD-L1 testing was 90% and so she should do well with the pembrolizumab, and I feel this is a reasonable choice.  This has already been started and we will plan to continue for a full year.  CT chest from October 27 reveals indistinct clustered new lingular pulmonary nodules up to 0.6 cm in diameter which are likely inflammatory.  We will plan to continue CT chest scans every 3 months.  There are some additional pulmonary nodules which are less than a centimeter and unchanged.  She has just a trace left pleural effusion after her left lower lobectomy.  She will then continue a 3-week schedule and I will plan to see her every 3 weeks.  I placed her on oral potassium supplement and encouraged her to include potassium in her diet.  Her appetite is poor and she complains of indigestion and belching with some cough productive of thick  clear mucus.  He has had some dyspnea and she tells me her cardiologist has recommended stent placement.  I will treat her with Zithromax 500 mg daily for 1 week and see her back in 1 week with CBC and CMP.  Later we will need to arrange for annual mammography and colonoscopy when she is due since she does have a history of colon polyps she understands and agrees with this plan of care. I have answered their questions and know to call with any concerns.   I provided 35 minutes of face-to-face time during this this encounter and > 50% was spent counseling as documented under my assessment and plan.    Derwood Kaplan, MD [facility] [address] [place] [address] Dept: [phone] Dept Fax: [phone]    CHIEF COMPLAINT:  CC: Adenocarcinoma of the left lung  Current Treatment:  Referral for 2nd opinion on surgical resection   HISTORY OF PRESENT ILLNESS:  Sydney Hunt is a 61 y.o. female with a history of tobacco abuse who has adenocarcinoma of the left lung. This was originally found on lung cancer screening CT from September 2022. PET imaging was obtained in October which revealed a solitary 1.25 cm left lower lobe pulmonary nodule to be hypermetabolic and consistent with small primary lung neoplasm. No findings for mediastinal or hilar adenopathy or metastatic disease elsewhere was observed. She met with the genetic counselor and [employer] diagnostic testing was negative for any clinically significant mutations. CT chest from December revealed enlarging left lower lobe nodule, measuring 1.4 x 1.9 cm. The additional smaller pulmonary nodules remained unchanged. She was referred to Dr. Baltazar Apo and underwent bronchoscopy on December 19th and cytology from this procedure confirmed malignant cells consistent with adenocarcioma. Immunohistochemical stains were positive for TTF-1, and negative for p63 and CK5/6. 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 Her FEV1 was 1.98, which is 88% of predicted and her FVC was 2.58, which is 89% of predicted. She was deemed a candidate for resection and met with Dr. Kipp Brood for consultation. As she is not motivated to quit smoking, he felt that surgery would present an excessive risk for postoperative respiratory complications. He recommended SBRT as a good option if the patient was unable to quit smoking.  The patient went for a second opinion to Dr. Asencion Gowda and surgical resection was carried out on April 12 with a left lower lobe lobectomy.  She was hospitalized for  13 days and has not smoked since that time.  Pathology revealed a 2.5 cm adenocarcinoma, grade 2.  There was no evidence of pleural invasion and margins are clear but she tells me 2 of 20 nodes were positive, for a T1c N1 M0, stage IIB.  A port was placed on August 16 and she was started on pembrolizumab on September 7 and had her second dose on September 28.  INTERVAL HISTORY:  I have reviewed her chart and materials related to her cancer extensively and collaborated history with the patient. Summary of oncologic history is as follows: Oncology History  Non-small cell lung cancer, left ([facility])  01/15/2021 Initial Diagnosis   Non-small cell lung cancer, left ([facility])   02/12/2021 Cancer Staging   Staging form: Lung, AJCC 8th Edition - Clinical stage from 02/12/2021: Stage IA2 (cT1b, cN0, cM0) - Signed by Derwood Kaplan, MD on 02/14/2021 Histopathologic type: Adenocarcinoma, NOS Stage prefix: Initial diagnosis Histologic grade (G): GX Histologic grading  system: 4 grade system Laterality: Left Tumor size (mm): 19 Lymph-vascular invasion (LVI): LVI not present (absent)/not identified Diagnostic confirmation: Positive histology Specimen type: Bronchial Biopsy Staged by: Managing physician Stage used in treatment planning: Yes National guidelines used in treatment planning: Yes Type of national guideline used in treatment planning: NCCN Staging comments: Rec surgical resection   05/11/2021 Cancer Staging   Staging form: Lung, AJCC 8th Edition - Pathologic stage from 05/11/2021: Stage IIB (ypT1c, pN1, cM0) - Signed by Derwood Kaplan, MD on 11/12/2021 Histopathologic type: Adenocarcinoma, NOS Stage prefix: Post-therapy Histologic grade (G): G2 Histologic grading system: 4 grade system Residual tumor (R): R0 - None Laterality: Left Tumor size (mm): 25 Lymph-vascular invasion (LVI): LVI not present (absent)/not identified Diagnostic confirmation: Positive histology PLUS positive  immunophenotyping and/or positive genetic studies Specimen type: Excision Staged by: Managing physician Type of lung cancer: Surgically resected non-small cell lung cancer ECOG performance status: Grade 1 Perineural invasion (PNI): Absent Pleural/elastic layer invasion: PL0 Pleural lavage cytology: Unknown Weight loss: Absent Adequacy of mediastinal dissection: Adequate Radiotherapy dose: No Adjuvant radiation: No Adjuvant chemotherapy: Yes Stage used in treatment planning: Yes National guidelines used in treatment planning: Yes Type of national guideline used in treatment planning: NCCN Staging comments: Adjuvant pembrolizumab, PDL 1 90%   10/04/2021 -  Chemotherapy   Patient is on Treatment Plan : LUNG NSCLC Pembrolizumab Adj q21d x 18 cycles     Interval History  Sydney Hunt returns from [place] where she was staying with her daughter as she went through her perioperative period.  She had surgery on April 12 with a left lower lobectomy for a 2.5 cm grade 2 adenocarcinoma with 2 of 20 nodes positive, stage IIb.  She was hospitalized for 13 days and has not smoked since that time.  It was recommended that she have chemotherapy and immunotherapy but she declined the chemotherapy.  [facility] testing for EGFR and ALK were negative but PD-L1 was 90%.  She did agree to pembrolizumab therapy and started that on September 7 with her second dose on September 28.  The port was placed on August 16.  She has had occasional nausea and alternating constipation and diarrhea.  Overall she has done well with this.  She has some increased cough with production of thick clear sputum, but denies hemoptysis.  He does complain of some dyspnea and so I do think I need to treat her with antibiotics in view of the changes on her CT scan.  Her appetite is not the best but her weight is stable.  I did a CT scan of the chest since it has been 6 months since her surgery.  CT chest from October 27 reveals indistinct clustered new  lingular pulmonary nodules up to 0.6 cm in diameter which are likely inflammatory.  We will plan to continue CT chest scans every 3 months.  There are some additional pulmonary nodules which are less than a centimeter and unchanged.  She has just a trace left pleural effusion after her left lower lobectomy.   She is taking gabapentin at bedtime.  She also has borderline osteoporosis and is on calcium supplement 1 daily.  She is due for annual mammography and routine colonoscopy as she has had colon polyps and we will plan to make those arrangements after she has stabilized.  I placed her on an oral potassium supplement since her potassium dropped to 2.8 last week.  She denies fever, chills or other signs of infection.  She denies vomiting or  abdominal pain.  She denies sore throat, or chest pain.  HISTORY:   Past Medical History:  Diagnosis Date   Allergy    Arthritis    Complication of anesthesia    slow to wake up   COPD (chronic obstructive pulmonary disease) (HCC)    GERD (gastroesophageal reflux disease)    Hypertension    Migraines     Past Surgical History:  Procedure Laterality Date   ABDOMINAL HYSTERECTOMY  1989   unsure of vaginal or abdominal   BRONCHIAL BIOPSY  01/15/2021   Procedure: BRONCHIAL BIOPSIES;  Surgeon: Collene Gobble, MD;  Location: [facility];  Service: Pulmonary;;   BRONCHIAL BRUSHINGS  01/15/2021   Procedure: BRONCHIAL BRUSHINGS;  Surgeon: Collene Gobble, MD;  Location: [facility];  Service: Pulmonary;;   BRONCHIAL NEEDLE ASPIRATION BIOPSY  01/15/2021   Procedure: BRONCHIAL NEEDLE ASPIRATION BIOPSIES;  Surgeon: Collene Gobble, MD;  Location: [facility];  Service: Pulmonary;;   COLONOSCOPY  2022   FIDUCIAL MARKER PLACEMENT  01/15/2021   Procedure: FIDUCIAL MARKER PLACEMENT;  Surgeon: Collene Gobble, MD;  Location: [facility];  Service: Pulmonary;;   ovaries removed  1986   surgery to remove scar tissue     from colon/bladder in patient's late 30's    [place]     years ago in patient's late 33s   [place]  01/15/2021   Procedure: [place];  Surgeon: Collene Gobble, MD;  Location: [facility];  Service: Pulmonary;;    Family History  Problem Relation Age of Onset   Pancreatic cancer Maternal Aunt 7   Brain cancer Maternal Uncle    Stomach cancer Maternal Uncle        dx. >50   Prostate cancer Half-Brother        passed away at 25   Prostate cancer Half-Brother     Social History:  reports that she quit smoking about 7 months ago. Her smoking use included cigarettes. She started smoking about 60 years ago. She has a 29.50 pack-year smoking history. She has never used smokeless tobacco. She reports current alcohol use of about 4.0 standard drinks of alcohol per week. She reports current drug use. Frequency: 3.00 times per week. Drug: Marijuana. She is widowed and lives at home.  She has quit smoking since April 12, when she had her surgical resection.  She has 2 biological children and 1 stepchild. She is disabled, and has been exposed to chemicals or other toxic agents. She was previously employed at a hosiery, chicken farm and cleaning houses. 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  Allergies:  Allergies  Allergen Reactions   Codeine Rash   Demerol [Meperidine Hcl] Other (See Comments)    coma   Other Dermatitis    Stainless steel / skin weeps   Silver Dermatitis    Sterling silver skin weeps   Gold Bond [Menthol-Zinc Oxide] Dermatitis    Skin weeps   Neosporin [Bacitracin-Polymyxin B] Dermatitis    Skin weeps   Wound Dressing Adhesive Other (See Comments)    CAUSES SKIN TEARS PER PATIENT. INSTEAD USE THE TEGADERM 76M WITH ADHESIVE FREE WINDOW OVER PORT.    Current Medications: Current Outpatient Medications  Medication Sig Dispense Refill   albuterol (VENTOLIN  HFA) 108 (90 Base) MCG/ACT inhaler Inhale 2 puffs into the lungs every 4 (four) hours as needed for wheezing or shortness  of breath.     amitriptyline (ELAVIL) 10 MG tablet Take 10 mg by mouth at bedtime as needed for sleep.     Ascorbic Acid (VITAMIN C WITH ROSE HIPS) 500 MG tablet Take 500 mg by mouth daily.     aspirin 81 MG EC tablet Take 81 mg by mouth daily.     atorvastatin (LIPITOR) 40 MG tablet Take 40 mg by mouth daily.     azithromycin (ZITHROMAX) 500 MG tablet Take 1 tablet (500 mg total) by mouth daily. 7 tablet 1   b complex vitamins capsule Take 1 capsule by mouth daily.     Biotin 5000 MCG CAPS Take by mouth.     bisacodyl (DULCOLAX) 5 MG EC tablet Take 5 mg by mouth 2 (two) times daily. 1-2 tabs for constipation     Calcium-Vitamin D-Vitamin K (CHEWABLE CALCIUM PO) Take by mouth. 650 mg calcium chocolate chews     celecoxib (CELEBREX) 100 MG capsule      gabapentin (NEURONTIN) 300 MG capsule Take 300 mg by mouth at bedtime.     HYDROcodone-acetaminophen (NORCO/VICODIN) 5-325 MG tablet Take 1 tablet by mouth every 6 (six) hours as needed.     lidocaine-prilocaine (EMLA) cream Apply 1 Application topically as needed. 30 g 0   ondansetron (ZOFRAN) 4 MG tablet Take 1 tablet (4 mg total) by mouth every 4 (four) hours as needed for nausea. 90 tablet 3   potassium chloride SA (KLOR-CON M) 20 MEQ tablet Take 20 mEq by mouth daily.     vitamin E 180 MG (400 UNITS) capsule Take 400 Units by mouth daily.     No current facility-administered medications for this visit.    REVIEW OF SYSTEMS:  Review of Systems  Constitutional: Negative.  Negative for appetite change, chills, fatigue, fever and unexpected weight change.  HENT:  Negative.    Eyes: Negative.   Respiratory:  Negative for chest tightness, cough (occasionally productive with clear sputum), hemoptysis and shortness of breath. Wheezing: occasional.  Cardiovascular: Negative.  Negative for chest pain, leg swelling and  palpitations.  Gastrointestinal: Negative.  Negative for abdominal distention, abdominal pain, blood in stool, constipation, diarrhea, nausea and vomiting.  Endocrine: Negative.   Genitourinary: Negative.  Negative for difficulty urinating, dysuria, frequency and hematuria.   Musculoskeletal: Negative.  Negative for arthralgias, back pain, flank pain, gait problem and myalgias.  Skin: Negative.   Neurological: Negative.  Negative for dizziness, extremity weakness, gait problem, headaches, light-headedness, numbness, seizures and speech difficulty.  Hematological: Negative.   Psychiatric/Behavioral: Negative.  Negative for depression and sleep disturbance. The patient is not nervous/anxious.        Dissociative identity disorder      VITALS:  Blood pressure 137/86, pulse 85, temperature 97.8 F (36.6 C), temperature source Oral, resp. rate (!) 21, height _0  (1.549 m), weight 132 lb 11.2 oz (60.2 kg), SpO2 96 %.  Wt Readings from Last 3 Encounters:  12/10/21 134 lb 0.6 oz (60.8 kg)  12/05/21 132 lb 11.2 oz (60.2 kg)  11/28/21 132 lb 11.2 oz (60.2 kg)    Body mass index is 25.07 kg/m.  Performance status (ECOG): 1 - Symptomatic but completely ambulatory  PHYSICAL EXAM:  Physical Exam Constitutional:      General: She is not in acute distress.    Appearance: Normal appearance. She is normal weight.  HENT:     Head: Normocephalic and atraumatic.  Eyes:     General: No scleral icterus.    Extraocular  Movements: Extraocular movements intact.     Conjunctiva/sclera: Conjunctivae normal.     Pupils: Pupils are equal, round, and reactive to light.  Cardiovascular:     Rate and Rhythm: Normal rate and regular rhythm.     Pulses: Normal pulses.     Heart sounds: Normal heart sounds. No murmur heard.    No friction rub. No gallop.  Pulmonary:     Effort: Pulmonary effort is normal. No respiratory distress.     Breath sounds: Normal breath sounds.  Abdominal:     General: Bowel  sounds are normal. There is no distension.     Palpations: Abdomen is soft. There is no hepatomegaly, splenomegaly or mass.     Tenderness: There is no abdominal tenderness.  Musculoskeletal:        General: Normal range of motion.     Cervical back: Normal range of motion and neck supple.     Right lower leg: No edema.     Left lower leg: No edema.  Lymphadenopathy:     Cervical: No cervical adenopathy.  Skin:    General: Skin is warm and dry.  Neurological:     General: No focal deficit present.     Mental Status: She is alert and oriented to person, place, and time. Mental status is at baseline.  Psychiatric:        Mood and Affect: Mood normal.        Behavior: Behavior normal.        Thought Content: Thought content normal.        Judgment: Judgment normal.      LABS:      Latest Ref Rng & Units 12/05/2021    3:02 PM 11/19/2021   12:00 AM 11/08/2021   12:00 AM  CBC  WBC 4.0 - 10.5 K/uL 13.8  10.3     11.2      Hemoglobin 12.0 - 15.0 g/dL 13.7  11.7     12.8      Hematocrit 36.0 - 46.0 % 41.2  34     37      Platelets 150 - 400 K/uL 424  319     363         This result is from an external source.      Latest Ref Rng & Units 12/05/2021    3:02 PM 11/19/2021   12:00 AM 11/08/2021   12:00 AM  CMP  Glucose 70 - 99 mg/dL 120     BUN 8 - 23 mg/dL _0 Creatinine 0.44 - 1.00 mg/dL 1.14  0.9     0.8      Sodium 135 - 145 mmol/L 142  134     138      Potassium 3.5 - 5.1 mmol/L 3.6  2.8     3.1      Chloride 98 - 111 mmol/L 101  101     100      CO2 22 - 32 mmol/L 29  30     34      Calcium 8.9 - 10.3 mg/dL 10.6  9.6     10.3      Total Protein 6.5 - 8.1 g/dL 7.8     Total Bilirubin 0.3 - 1.2 mg/dL 1.0     Alkaline Phos 38 - 126 U/L 75  92     83      AST 15 - 41  U/L _0 ALT 0 - 44 U/L _1 This result is from an external source.     Lab Results  Component Value Date   CEA1 2.8 11/08/2021   /  CEA  Date Value  Ref Range Status  11/08/2021 2.8 0.0 - 4.7 ng/mL Final    Comment:    (NOTE)                             Nonsmokers          <3.9                             Smokers             <5.6 Roche [employer]ostics Electrochemiluminescence Immunoassay (ECLIA) Values obtained with different assay methods or kits cannot be used interchangeably.  Results cannot be interpreted as absolute evidence of the presence or absence of malignant disease. Performed At: [facility] [place], [place] [id number] Rush Farmer MD [id number]      STUDIES:  No results found.    EXAM: 10/13/2020 CT CHEST WITHOUT CONTRAST LOW-DOSE FOR LUNG CANCER SCREENING   TECHNIQUE:  Multidetector CT imaging of the chest was performed following the  standard protocol without IV contrast.   COMPARISON: None.   FINDINGS:  Cardiovascular: Atherosclerotic calcification of the aorta. Heart  size normal. No pericardial effusion.  Mediastinum/Nodes: No pathologically enlarged mediastinal or  axillary lymph nodes. Hilar regions are difficult to definitively  evaluate without IV contrast but appear grossly unremarkable. There  may be distal esophageal wall thickening which can be seen with  gastroesophageal reflux.  Lungs/Pleura: Mild biapical pleuroparenchymal scarring.  Centrilobular and paraseptal emphysema. Smoking related respiratory  bronchiolitis. 11.2 mm peripheral left lower lobe nodule (4/178) has  somewhat lobulated borders. Other pulmonary nodules measure 5.3 mm  or less in size. No pleural fluid. Airway is unremarkable.  Upper Abdomen: Visualized portions of the liver, gallbladder,  adrenal glands, kidneys, spleen, pancreas, stomach and bowel are  grossly unremarkable.  Musculoskeletal: No worrisome lytic or sclerotic lesions.   IMPRESSION:  1. Lung-RADS 4A, suspicious. Follow up low-dose chest CT without  contrast in 3 months (please use the following order, "CT CHEST LCS  NODULE  FOLLOW-UP W/O CM") is recommended. Alternatively, PET may be  considered when there is a solid component 45m or larger. 11.2 mm  peripheral left lower lobe nodule. These results will be called to  the ordering clinician or representative by the Radiologist  Assistant, and communication documented in the PACS or [employer]  2. Aortic atherosclerosis (ICD10-I70.0).  3. Emphysema (ICD10-J43.9).   EXAM: 11/01/2020 NUCLEAR MEDICINE PET SKULL BASE TO THIGH   TECHNIQUE: 6.51 mCi F-18 FDG was injected intravenously. Full-ring PET imaging was performed from the skull base to thigh after the radiotracer. CT data was obtained and used for attenuation correction and anatomic localization.   Fasting blood glucose: 103 mg/dl   COMPARISON:  Chest CT 10/13/2020   FINDINGS: Mediastinal blood pool activity: SUV max 2.13   Liver activity: SUV max NA   NECK: No hypermetabolic lymph nodes in the neck.   Incidental CT findings: none   CHEST: 12.5 mm lobulated left lower lobe subpleural pulmonary nodule is hypermetabolic with  SUV max of 5.65 and consistent with primary lung neoplasm.   No enlarged or hypermetabolic mediastinal or hilar lymph nodes to suggest metastatic adenopathy. No supraclavicular or axillary adenopathy.   No other pulmonary lesions.   Incidental CT findings: Stable underlying emphysematous changes and stable vascular calcifications.   ABDOMEN/PELVIS: No abnormal hypermetabolic activity within the liver, pancreas, adrenal glands, or spleen. No hypermetabolic lymph nodes in the abdomen or pelvis.   Incidental CT findings: Scattered atherosclerotic calcifications involving the aorta and iliac arteries.   SKELETON: No significant bony findings. No evidence of metastatic disease.   Incidental CT findings: none   IMPRESSION: 1. Solitary 12.5 mm left lower lobe pulmonary nodule is hypermetabolic and consistent with small primary lung neoplasm. 2. No findings  for mediastinal or hilar adenopathy or metastatic disease elsewhere.  FINAL MICROSCOPIC DIAGNOSIS: 01/15/2021  A. LUNG, LLL, BRUSH:  - Atypical cells present   B. LUNG, LLL, FINE NEEDLE ASPIRATION:  - Malignant cells consistent with adenocarcinoma, see comment   COMMENT:   B.   Immunohistochemical stains show that the tumor cells are positive  for TTF-1 while they are negative for p63 and CK5/6, consistent with  above interpretation.  Dr. Saralyn Pilar reviewed the case and concurs with  the diagnosis.  Dr. Lamonte Sakai was notified on 01/19/2021.

## 2021-12-03 ENCOUNTER — Telehealth: Payer: Self-pay

## 2021-12-03 NOTE — Telephone Encounter (Signed)
Ulice Dash, Pharmacist, says to scan the chest xray into the chart.

## 2021-12-03 NOTE — Telephone Encounter (Signed)
-----   Message from Derwood Kaplan, MD sent at 12/03/2021 10:02 AM EST ----- Regarding: FW: chest xray report [name], will give to you, find out if needs to see right away or can just scan in ----- Message ----- From: Derwood Kaplan, MD Sent: 12/03/2021   9:59 AM EST To: Juanetta Beets, RPH Subject: chest xray report                              Aquilla Hacker sorry, I got this chest xray report a couple weeks ago. You had requested to check that port tip was correct. It is, I have the report of port insertion and of Chest xray after. Will scan in.

## 2021-12-05 ENCOUNTER — Inpatient Hospital Stay (INDEPENDENT_AMBULATORY_CARE_PROVIDER_SITE_OTHER): Payer: Medicare HMO | Admitting: Hematology and Oncology

## 2021-12-05 ENCOUNTER — Inpatient Hospital Stay: Payer: Medicare HMO

## 2021-12-05 ENCOUNTER — Encounter: Payer: Self-pay | Admitting: Hematology and Oncology

## 2021-12-05 VITALS — BP 134/81 | HR 72 | Temp 98.9°F | Resp 18 | Ht 61.0 in | Wt 132.7 lb

## 2021-12-05 DIAGNOSIS — Z885 Allergy status to narcotic agent status: Secondary | ICD-10-CM | POA: Diagnosis not present

## 2021-12-05 DIAGNOSIS — C3492 Malignant neoplasm of unspecified part of left bronchus or lung: Secondary | ICD-10-CM | POA: Diagnosis not present

## 2021-12-05 DIAGNOSIS — Z5112 Encounter for antineoplastic immunotherapy: Secondary | ICD-10-CM | POA: Diagnosis present

## 2021-12-05 DIAGNOSIS — E876 Hypokalemia: Secondary | ICD-10-CM | POA: Diagnosis not present

## 2021-12-05 DIAGNOSIS — K30 Functional dyspepsia: Secondary | ICD-10-CM | POA: Diagnosis not present

## 2021-12-05 DIAGNOSIS — J90 Pleural effusion, not elsewhere classified: Secondary | ICD-10-CM | POA: Diagnosis not present

## 2021-12-05 DIAGNOSIS — F4481 Dissociative identity disorder: Secondary | ICD-10-CM | POA: Diagnosis not present

## 2021-12-05 DIAGNOSIS — F1290 Cannabis use, unspecified, uncomplicated: Secondary | ICD-10-CM | POA: Diagnosis not present

## 2021-12-05 DIAGNOSIS — Z79899 Other long term (current) drug therapy: Secondary | ICD-10-CM | POA: Diagnosis not present

## 2021-12-05 DIAGNOSIS — R058 Other specified cough: Secondary | ICD-10-CM | POA: Diagnosis not present

## 2021-12-05 DIAGNOSIS — C3432 Malignant neoplasm of lower lobe, left bronchus or lung: Secondary | ICD-10-CM | POA: Diagnosis present

## 2021-12-05 DIAGNOSIS — Z8719 Personal history of other diseases of the digestive system: Secondary | ICD-10-CM | POA: Diagnosis not present

## 2021-12-05 DIAGNOSIS — Z90722 Acquired absence of ovaries, bilateral: Secondary | ICD-10-CM | POA: Diagnosis not present

## 2021-12-05 LAB — CMP (CANCER CENTER ONLY)
ALT: 17 U/L (ref 0–44)
AST: 20 U/L (ref 15–41)
Albumin: 4.1 g/dL (ref 3.5–5.0)
Alkaline Phosphatase: 75 U/L (ref 38–126)
Anion gap: 12 (ref 5–15)
BUN: 13 mg/dL (ref 8–23)
CO2: 29 mmol/L (ref 22–32)
Calcium: 10.6 mg/dL — ABNORMAL HIGH (ref 8.9–10.3)
Chloride: 101 mmol/L (ref 98–111)
Creatinine: 1.14 mg/dL — ABNORMAL HIGH (ref 0.44–1.00)
GFR, Estimated: 54 mL/min — ABNORMAL LOW (ref >60)
Glucose, Bld: 120 mg/dL — ABNORMAL HIGH (ref 70–99)
Potassium: 3.6 mmol/L (ref 3.5–5.1)
Sodium: 142 mmol/L (ref 135–145)
Total Bilirubin: 1 mg/dL (ref 0.3–1.2)
Total Protein: 7.8 g/dL (ref 6.5–8.1)

## 2021-12-05 LAB — CBC WITH DIFFERENTIAL (CANCER CENTER ONLY)
Abs Immature Granulocytes: 0.03 10*3/uL (ref 0.00–0.07)
Basophils Absolute: 0.2 10*3/uL — ABNORMAL HIGH (ref 0.0–0.1)
Basophils Relative: 1 %
Eosinophils Absolute: 1.5 10*3/uL — ABNORMAL HIGH (ref 0.0–0.5)
Eosinophils Relative: 11 %
HCT: 41.2 % (ref 36.0–46.0)
Hemoglobin: 13.7 g/dL (ref 12.0–15.0)
Immature Granulocytes: 0 %
Lymphocytes Relative: 28 %
Lymphs Abs: 3.9 10*3/uL (ref 0.7–4.0)
MCH: 31.9 pg (ref 26.0–34.0)
MCHC: 33.3 g/dL (ref 30.0–36.0)
MCV: 96 fL (ref 80.0–100.0)
Monocytes Absolute: 1 10*3/uL (ref 0.1–1.0)
Monocytes Relative: 8 %
Neutro Abs: 7.2 10*3/uL (ref 1.7–7.7)
Neutrophils Relative %: 52 %
Platelet Count: 424 10*3/uL — ABNORMAL HIGH (ref 150–400)
RBC: 4.29 MIL/uL (ref 3.87–5.11)
RDW: 13.1 % (ref 11.5–15.5)
WBC Count: 13.8 10*3/uL — ABNORMAL HIGH (ref 4.0–10.5)
nRBC: 0 % (ref 0.0–0.2)

## 2021-12-05 NOTE — Progress Notes (Signed)
Sydney Hunt  [address] 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  She was treated with left lower lobectomy in [place].  Her doctor there recommended chemotherapy with immunotherapy, but she refused chemotherapy.  PD-L1 was positive.  She was only willing to take the immunotherapy for 1 year and had a port placed.  She started pembrolizumab in September, then relocated to [place].  We saw her to establish care in October and have continued pembrolizumab every 21 days. We obtained a CT chest in October, which revealed new indistinct clustered lingular pulmonary nodules the largest measuring 6 mm, which were indeterminate.  Follow-up CT chest in 3 months was recommended.  She will proceed with a fourth cycle of pembrolizumab next week.  We will plan to see her back in 3 weeks prior to a 5th cycle.   The patient understands the plans discussed today and is in agreement with them.  She knows to contact our office if she develops concerns prior to her next appointment.      Sydney Pickles, PA-C  [facility] [address] [place] [address] Dept: [phone] Dept Fax: [phone]   No orders of the defined types were placed in this encounter.     CHIEF COMPLAINT:  CC: Stage IIB lung cancer  Current Treatment: Adjuvant pembrolizumab every 3 weeks  HISTORY OF PRESENT ILLNESS:  Stage IIB adenocarcinoma of the left lower lobe positive for TTF-1 diagnosed in January. This was initially found on lung cancer screening CT in September 2022. Most recent CT imaging revealed the solitary left lower lobe nodule to be enlarging, measuring 1.2 cm in September and now measuring 1.4 x 1.9 cm.   She met with Dr. Kipp Brood for consultation, and he recommended SBRT, as he felt that her smoking would cause excessive risk for postoperative complications. She sought another opinion with  Dr. Richard Miu in [place] who agreed to her surgical management. Her daughter lives in [place] and will be her support system.  She underwent left lower lobectomy in April with mediastinal lymph node dissection.  Pathology revealed a 2.5 cm, grade 2, adenocarcinoma.  There no evidence of visceral pleural invasion or direct invasion of adjacent structures.  All margins were negative for invasive carcinoma.  2 out of 20 lymph nodes were positive for metastatic disease.  EGFR and ALK mutations were negative, but PD-L1 was 90%.  She was willing to take immunotherapy, but not chemotherapy.  Immunotherapy was not started until August.  We obtained a CT chest in October which revealed new indistinct clustered lingular pulmonary nodules the largest measuring 6 mm, which were indeterminate.  Follow-up CT chest in 3 months was recommended.  Oncology History  Non-small cell lung cancer, left ([place])  01/15/2021 Initial Diagnosis   Non-small cell lung cancer, left ([place])   02/12/2021 Cancer Staging   Staging form: Lung, AJCC 8th Edition - Clinical stage from 02/12/2021: Stage IA2 (cT1b, cN0, cM0) - Signed by Derwood Kaplan, MD on 02/14/2021 Histopathologic type: Adenocarcinoma, NOS Stage prefix: Initial diagnosis Histologic grade (G): GX Histologic grading system: 4 grade system Laterality: Left Tumor size (mm): 19 Lymph-vascular invasion (LVI): LVI not present (absent)/not identified Diagnostic confirmation: Positive histology Specimen type: Bronchial Biopsy  Staged by: Managing physician Stage used in treatment planning: Yes National guidelines used in treatment planning: Yes Type of national guideline used in treatment planning: NCCN Staging comments: Rec surgical resection   05/11/2021 Cancer Staging    Staging form: Lung, AJCC 8th Edition - Pathologic stage from 05/11/2021: Stage IIB (ypT1c, pN1, cM0) - Signed by Derwood Kaplan, MD on 11/12/2021 Histopathologic type: Adenocarcinoma, NOS Stage prefix: Post-therapy Histologic grade (G): G2 Histologic grading system: 4 grade system Residual tumor (R): R0 - None Laterality: Left Tumor size (mm): 25 Lymph-vascular invasion (LVI): LVI not present (absent)/not identified Diagnostic confirmation: Positive histology PLUS positive immunophenotyping and/or positive genetic studies Specimen type: Excision Staged by: Managing physician Type of lung cancer: Surgically resected non-small cell lung cancer ECOG performance status: Grade 1 Perineural invasion (PNI): Absent Pleural/elastic layer invasion: PL0 Pleural lavage cytology: Unknown Weight loss: Absent Adequacy of mediastinal dissection: Adequate Radiotherapy dose: No Adjuvant radiation: No Adjuvant chemotherapy: Yes Stage used in treatment planning: Yes National guidelines used in treatment planning: Yes Type of national guideline used in treatment planning: NCCN Staging comments: Adjuvant pembrolizumab, PDL 1 90%   10/04/2021 -  Chemotherapy   Patient is on Treatment Plan : LUNG NSCLC Pembrolizumab Adj q21d x 18 cycles         INTERVAL HISTORY:  Sydney Hunt is here today for repeat clinical assessment prior to a fourth cycle pembrolizumab.  She reports chronic shortness of breath and cough productive of clear sputum.  She continues to smoke marijuana, but not cigarettes.  She reports daily heartburn for which Tums is effective.  I recommended that we place her on a prescription medication for acid reflux.  She is willing to give that a try for 30 days.  She reports soft stool, but not frank diarrhea.  She denies itching or rash.  She denies fevers or chills. She reports chronic low back and hip pain. Her appetite is good. Her weight has been stable.  REVIEW OF SYSTEMS:  Review of Systems   Constitutional:  Negative for appetite change, chills, fatigue, fever and unexpected weight change.  HENT:   Negative for lump/mass, mouth sores and sore throat.   Respiratory:  Positive for cough and shortness of breath.   Cardiovascular:  Negative for chest pain and leg swelling.  Gastrointestinal:  Negative for abdominal pain, constipation, diarrhea, nausea and vomiting.  Endocrine: Negative for hot flashes.  Genitourinary:  Negative for difficulty urinating, dysuria, frequency and hematuria.   Musculoskeletal:  Positive for arthralgias and back pain. Negative for myalgias.  Skin:  Negative for rash.  Neurological:  Negative for dizziness and headaches.  Hematological:  Negative for adenopathy. Does not bruise/bleed easily.  Psychiatric/Behavioral:  Negative for depression and sleep disturbance. The patient is not nervous/anxious.      VITALS:  Blood pressure 134/81, pulse 72, temperature 98.9 F (37.2 C), temperature source Oral, resp. rate 18, height _0  (1.549 m), weight 132 lb 11.2 oz (60.2 kg), SpO2 97 %.  Wt Readings from Last 3 Encounters:  12/05/21 132 lb 11.2 oz (60.2 kg)  11/28/21 132 lb 11.2 oz (60.2 kg)  11/19/21 130 lb 12 oz (59.3 kg)    Body mass index is 25.07 kg/m.  Performance status (ECOG): 1 - Symptomatic but completely ambulatory  PHYSICAL EXAM:  Physical Exam Vitals and nursing note reviewed.  Constitutional:      General: She is not in acute distress.    Appearance: Normal appearance.  HENT:     Head: Normocephalic and  atraumatic.     Mouth/Throat:     Mouth: Mucous membranes are moist.     Pharynx: Oropharynx is clear. No oropharyngeal exudate or posterior oropharyngeal erythema.  Eyes:     General: No scleral icterus.    Extraocular Movements: Extraocular movements intact.     Conjunctiva/sclera: Conjunctivae normal.     Pupils: Pupils are equal, round, and reactive to light.  Cardiovascular:     Rate and Rhythm: Normal rate and regular  rhythm.     Heart sounds: Normal heart sounds. No murmur heard.    No friction rub. No gallop.  Pulmonary:     Effort: Pulmonary effort is normal.     Breath sounds: Normal breath sounds. No wheezing, rhonchi or rales.  Abdominal:     General: There is no distension.     Palpations: Abdomen is soft. There is no hepatomegaly, splenomegaly or mass.     Tenderness: There is no abdominal tenderness.  Musculoskeletal:        General: Normal range of motion.     Cervical back: Normal range of motion and neck supple. No tenderness.     Right lower leg: No edema.     Left lower leg: No edema.  Lymphadenopathy:     Cervical: No cervical adenopathy.     Upper Body:     Right upper body: No supraclavicular or axillary adenopathy.     Left upper body: No supraclavicular or axillary adenopathy.     Lower Body: No right inguinal adenopathy. No left inguinal adenopathy.  Skin:    General: Skin is warm and dry.     Coloration: Skin is not jaundiced.     Findings: No rash.  Neurological:     Mental Status: She is alert and oriented to person, place, and time.     Cranial Nerves: No cranial nerve deficit.  Psychiatric:        Mood and Affect: Mood normal.        Behavior: Behavior normal.        Thought Content: Thought content normal.     LABS:      Latest Ref Rng & Units 11/19/2021   12:00 AM 11/08/2021   12:00 AM 03/06/2021   12:00 AM  CBC  WBC  10.3     11.2     10.8      Hemoglobin 12.0 - 16.0 11.7     12.8     14.5      Hematocrit 36 - 46 34     37     41      Platelets 150 - 400 K/uL 319     363     312         This result is from an external source.      Latest Ref Rng & Units 11/19/2021   12:00 AM 11/08/2021   12:00 AM 03/06/2021   12:00 AM  CMP  BUN 4 - _0 Creatinine 0.5 - 1.1 0.9     0.8     0.8      Sodium 137 - 147 134     138     142      Potassium 3.5 - 5.1 mEq/L 2.8     3.1     3.7      Chloride 99 - 108 101     100  105      CO2 13 - 22  30     34     31      Calcium 8.7 - 10.7 9.6     10.3     9.4      Alkaline Phos 25 - 125 92     83     77      AST 13 - 35 _0 ALT 7 - 35 U/L _1 This result is from an external source.     Lab Results  Component Value Date   CEA1 2.8 11/08/2021   /  CEA  Date Value Ref Range Status  11/08/2021 2.8 0.0 - 4.7 ng/mL Final    Comment:    (NOTE)                             Nonsmokers          <3.9                             Smokers             <5.6 Roche [employer] Electrochemiluminescence Immunoassay (ECLIA) Values obtained with different assay methods or kits cannot be used interchangeably.  Results cannot be interpreted as absolute evidence of the presence or absence of malignant disease. Performed At: [facility] [place], [place] [id number] Rush Farmer MD OF:[id number]    No results found for: "PSA1" No results found for: [phone]" No results found for: "CAN125"  No results found for: "TOTALPROTELP", "ALBUMINELP", "A1GS", "A2GS", "BETS", "BETA2SER", "GAMS", "MSPIKE", "SPEI" No results found for: "TIBC", "FERRITIN", "IRONPCTSAT" No results found for: "LDH"  STUDIES:  No results found.    HISTORY:   Past Medical History:  Diagnosis Date   Allergy    Arthritis    Complication of anesthesia    slow to wake up   COPD (chronic obstructive pulmonary disease) (HCC)    GERD (gastroesophageal reflux disease)    Hypertension    Migraines     Past Surgical History:  Procedure Laterality Date   ABDOMINAL HYSTERECTOMY  1989   unsure of vaginal or abdominal   BRONCHIAL BIOPSY  01/15/2021   Procedure: BRONCHIAL BIOPSIES;  Surgeon: Collene Gobble, MD;  Location: [facility];  Service: Pulmonary;;   BRONCHIAL BRUSHINGS  01/15/2021   Procedure: BRONCHIAL BRUSHINGS;  Surgeon: Collene Gobble, MD;  Location: [facility];  Service: Pulmonary;;   BRONCHIAL NEEDLE ASPIRATION BIOPSY  01/15/2021    Procedure: BRONCHIAL NEEDLE ASPIRATION BIOPSIES;  Surgeon: Collene Gobble, MD;  Location: [facility];  Service: Pulmonary;;   COLONOSCOPY  2022   FIDUCIAL MARKER PLACEMENT  01/15/2021   Procedure: FIDUCIAL MARKER PLACEMENT;  Surgeon: Collene Gobble, MD;  Location: [facility];  Service: Pulmonary;;   ovaries removed  1986   surgery to remove scar tissue     from colon/bladder in patient's late 30's   [place]     years ago in patient's late 6s   [place]  01/15/2021   Procedure: [place];  Surgeon: Collene Gobble, MD;  Location: [facility];  Service: Pulmonary;;    Family History  Problem Relation Age of Onset   Pancreatic cancer Maternal Aunt 6   Brain cancer Maternal Uncle    Stomach cancer Maternal Uncle        dx. >50   Prostate cancer Half-Brother        passed away at 5   Prostate cancer Half-Brother     Social History:  reports that she quit smoking about 7 months ago. Her smoking use included cigarettes. She started smoking about 60 years ago. She has a 29.50 pack-year smoking history. She has never used smokeless tobacco. She reports current alcohol use of about 4.0 standard drinks of alcohol per week. She reports current drug use. Frequency: 3.00 times per week. Drug: Marijuana.The patient is alone today.  Allergies:  Allergies  Allergen Reactions   Codeine Rash   Demerol [Meperidine Hcl] Other (See Comments)    coma   Other Dermatitis    Stainless steel / skin weeps   Silver Dermatitis    Sterling silver skin weeps   Gold Bond [Menthol-Zinc Oxide] Dermatitis    Skin weeps   Neosporin [Bacitracin-Polymyxin B] Dermatitis    Skin weeps   Wound Dressing Adhesive Other (See Comments)    CAUSES SKIN TEARS PER PATIENT. INSTEAD USE THE TEGADERM 75M WITH ADHESIVE FREE WINDOW OVER PORT.    Current Medications: Current Outpatient  Medications  Medication Sig Dispense Refill   albuterol (VENTOLIN HFA) 108 (90 Base) MCG/ACT inhaler Inhale 2 puffs into the lungs every 4 (four) hours as needed for wheezing or shortness of breath.     amitriptyline (ELAVIL) 10 MG tablet Take 10 mg by mouth at bedtime as needed for sleep.     Ascorbic Acid (VITAMIN C WITH ROSE HIPS) 500 MG tablet Take 500 mg by mouth daily.     aspirin 81 MG EC tablet Take 81 mg by mouth daily.     atorvastatin (LIPITOR) 40 MG tablet Take 40 mg by mouth daily.     azithromycin (ZITHROMAX) 500 MG tablet Take 1 tablet (500 mg total) by mouth daily. 7 tablet 1   b complex vitamins capsule Take 1 capsule by mouth daily.     Biotin 5000 MCG CAPS Take by mouth.     bisacodyl (DULCOLAX) 5 MG EC tablet Take 5 mg by mouth 2 (two) times daily. 1-2 tabs for constipation     Calcium-Vitamin D-Vitamin K (CHEWABLE CALCIUM PO) Take by mouth. 650 mg calcium chocolate chews     celecoxib (CELEBREX) 100 MG capsule      gabapentin (NEURONTIN) 300 MG capsule Take 300 mg by mouth at bedtime.     HYDROcodone-acetaminophen (NORCO/VICODIN) 5-325 MG tablet Take 1 tablet by mouth every 6 (six) hours as needed.     lidocaine-prilocaine (EMLA) cream Apply 1 Application topically as needed. 30 g 0   ondansetron (ZOFRAN) 4 MG tablet Take 1 tablet (4 mg total) by mouth every 4 (four) hours as needed for nausea. 90 tablet 3   potassium chloride SA (KLOR-CON M) 20 MEQ tablet Take 20 mEq by mouth daily.     vitamin E 180 MG (400 UNITS) capsule Take 400 Units by mouth daily.     No current facility-administered medications for this visit.

## 2021-12-05 NOTE — Assessment & Plan Note (Addendum)
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  She was treated with left lower lobectomy in [place].  Her doctor there recommended chemotherapy with immunotherapy, but she refused chemotherapy.  PD-L1 was positive.  She was only willing to take the immunotherapy for 1 year and had a port placed.  She started pembrolizumab in September, then relocated to [place].  We saw her to establish care in October and have continued pembrolizumab every 21 days. We obtained a CT chest in October, which revealed new indistinct clustered lingular pulmonary nodules the largest measuring 6 mm, which were indeterminate.  Follow-up CT chest in 3 months was recommended.  She will proceed with a fourth cycle of pembrolizumab next week.  We will plan to see her back in 3 weeks prior to a 5th cycle.

## 2021-12-06 ENCOUNTER — Other Ambulatory Visit: Payer: Self-pay

## 2021-12-07 MED FILL — Pembrolizumab IV Soln 100 MG/4ML (25 MG/ML): INTRAVENOUS | Qty: 8 | Status: AC

## 2021-12-10 ENCOUNTER — Inpatient Hospital Stay: Payer: Medicare HMO

## 2021-12-10 VITALS — BP 110/81 | HR 78 | Temp 98.1°F | Resp 20 | Ht 61.0 in | Wt 134.0 lb

## 2021-12-10 DIAGNOSIS — Z5112 Encounter for antineoplastic immunotherapy: Secondary | ICD-10-CM | POA: Diagnosis not present

## 2021-12-10 DIAGNOSIS — C3492 Malignant neoplasm of unspecified part of left bronchus or lung: Secondary | ICD-10-CM

## 2021-12-10 MED ORDER — SODIUM CHLORIDE 0.9 % IV SOLN
200.0000 mg | Freq: Once | INTRAVENOUS | Status: AC
  Administered 2021-12-10: 200 mg via INTRAVENOUS
  Filled 2021-12-10: qty 8

## 2021-12-10 MED ORDER — SODIUM CHLORIDE 0.9 % IV SOLN: Freq: Once | INTRAVENOUS | Status: AC

## 2021-12-10 NOTE — Patient Instructions (Signed)

## 2021-12-11 ENCOUNTER — Encounter: Payer: Self-pay | Admitting: Hematology and Oncology

## 2021-12-11 ENCOUNTER — Encounter: Payer: Self-pay | Admitting: Oncology

## 2021-12-19 ENCOUNTER — Encounter: Payer: Self-pay | Admitting: Oncology

## 2021-12-26 NOTE — Assessment & Plan Note (Signed)
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  She was treated with left lower lobectomy in [place].  Her doctor there recommended chemotherapy with immunotherapy, but she refused chemotherapy.  PD-L1 was positive.  She was only willing to take the immunotherapy for 1 year and had a port placed.  She started pembrolizumab in September, then relocated to [place].  We saw her to establish care in October and have continued pembrolizumab every 21 days. We obtained a CT chest in October, which revealed new indistinct clustered lingular pulmonary nodules the largest measuring 6 mm, which were indeterminate.  Follow-up CT chest in 3 months was recommended.  She will proceed with a 5th cycle of pembrolizumab next week.  We will plan to see her back in 3 weeks prior to a 6th cycle.

## 2021-12-27 ENCOUNTER — Telehealth: Payer: Self-pay

## 2021-12-27 ENCOUNTER — Inpatient Hospital Stay (INDEPENDENT_AMBULATORY_CARE_PROVIDER_SITE_OTHER): Payer: Medicare HMO | Admitting: Hematology and Oncology

## 2021-12-27 ENCOUNTER — Inpatient Hospital Stay: Payer: Medicare HMO

## 2021-12-27 ENCOUNTER — Encounter: Payer: Self-pay | Admitting: Hematology and Oncology

## 2021-12-27 VITALS — BP 116/82 | HR 68 | Temp 98.4°F | Resp 18 | Ht 61.0 in | Wt 135.5 lb

## 2021-12-27 DIAGNOSIS — E876 Hypokalemia: Secondary | ICD-10-CM | POA: Insufficient documentation

## 2021-12-27 DIAGNOSIS — C3492 Malignant neoplasm of unspecified part of left bronchus or lung: Secondary | ICD-10-CM

## 2021-12-27 DIAGNOSIS — R12 Heartburn: Secondary | ICD-10-CM

## 2021-12-27 DIAGNOSIS — Z5112 Encounter for antineoplastic immunotherapy: Secondary | ICD-10-CM | POA: Diagnosis not present

## 2021-12-27 HISTORY — DX: Heartburn: R12

## 2021-12-27 HISTORY — DX: Hypokalemia: E87.6

## 2021-12-27 LAB — CMP (CANCER CENTER ONLY)
ALT: 20 U/L (ref 0–44)
AST: 35 U/L (ref 15–41)
Albumin: 4.6 g/dL (ref 3.5–5.0)
Alkaline Phosphatase: 53 U/L (ref 38–126)
Anion gap: 10 (ref 5–15)
BUN: 13 mg/dL (ref 8–23)
CO2: 32 mmol/L (ref 22–32)
Calcium: 9.8 mg/dL (ref 8.9–10.3)
Chloride: 103 mmol/L (ref 98–111)
Creatinine: 1.13 mg/dL — ABNORMAL HIGH (ref 0.44–1.00)
GFR, Estimated: 55 mL/min — ABNORMAL LOW (ref >60)
Glucose, Bld: 94 mg/dL (ref 70–99)
Potassium: 3.7 mmol/L (ref 3.5–5.1)
Sodium: 145 mmol/L (ref 135–145)
Total Bilirubin: 1.1 mg/dL (ref 0.3–1.2)
Total Protein: 7.9 g/dL (ref 6.5–8.1)

## 2021-12-27 LAB — CBC WITH DIFFERENTIAL (CANCER CENTER ONLY)
Abs Immature Granulocytes: 0.03 10*3/uL (ref 0.00–0.07)
Basophils Absolute: 0.2 10*3/uL — ABNORMAL HIGH (ref 0.0–0.1)
Basophils Relative: 1 %
Eosinophils Absolute: 1.1 10*3/uL — ABNORMAL HIGH (ref 0.0–0.5)
Eosinophils Relative: 9 %
HCT: 40.7 % (ref 36.0–46.0)
Hemoglobin: 13.5 g/dL (ref 12.0–15.0)
Immature Granulocytes: 0 %
Lymphocytes Relative: 31 %
Lymphs Abs: 3.8 10*3/uL (ref 0.7–4.0)
MCH: 31.8 pg (ref 26.0–34.0)
MCHC: 33.2 g/dL (ref 30.0–36.0)
MCV: 95.8 fL (ref 80.0–100.0)
Monocytes Absolute: 0.8 10*3/uL (ref 0.1–1.0)
Monocytes Relative: 6 %
Neutro Abs: 6.4 10*3/uL (ref 1.7–7.7)
Neutrophils Relative %: 53 %
Platelet Count: 311 10*3/uL (ref 150–400)
RBC: 4.25 MIL/uL (ref 3.87–5.11)
RDW: 13.7 % (ref 11.5–15.5)
WBC Count: 12.2 10*3/uL — ABNORMAL HIGH (ref 4.0–10.5)
nRBC: 0 % (ref 0.0–0.2)

## 2021-12-27 MED ORDER — OMEPRAZOLE 40 MG PO CPDR: 40.0000 mg | DELAYED_RELEASE_CAPSULE | Freq: Every day | ORAL | 2 refills | Status: DC

## 2021-12-27 NOTE — Assessment & Plan Note (Signed)
Chronic heartburn.  I will place her on omeprazole 40 mg daily at this time.

## 2021-12-27 NOTE — Addendum Note (Signed)
Addended by: Daryel November on: 12/27/2021 02:52 PM   Modules accepted: Orders

## 2021-12-27 NOTE — Telephone Encounter (Signed)
Internal referral sent to Dr Revenkaur cardiologist.

## 2021-12-27 NOTE — Assessment & Plan Note (Signed)
Her potassium is normal on potassium chloride 20 meq daily.

## 2021-12-27 NOTE — Telephone Encounter (Signed)
-----   Message from Marvia Pickles, PA-C sent at 12/27/2021  1:20 PM EST ----- Please refer to cardiology for chest pain and shortness of breath when lying flat. Thanks

## 2021-12-27 NOTE — Progress Notes (Signed)
Sydney Hunt  [address] 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  She was treated with left lower lobectomy in [place].  Her doctor there recommended chemotherapy with immunotherapy, but she refused chemotherapy.  PD-L1 was positive.  She was only willing to take the immunotherapy for 1 year and had a port placed.  She started pembrolizumab in September, then relocated to [place].  We saw her to establish care in October and have continued pembrolizumab every 21 days. We obtained a CT chest in October, which revealed new indistinct clustered lingular pulmonary nodules the largest measuring 6 mm, which were indeterminate.  Follow-up CT chest in 3 months was recommended.  She reports orthopnea with chest discomfort with a history of coronary artery disease, so we will refer her to cardiology.  She will proceed with a 5th cycle of pembrolizumab next week.  We will plan to see her back in 3 weeks prior to a 6th cycle.  Hypokalemia Her potassium is normal on potassium chloride 20 meq daily.  Heartburn Chronic heartburn.  I will place her on omeprazole 40 mg daily at this time.   The patient understands the plans discussed today and is in agreement with them.  She knows to contact our office if she develops concerns prior to her next appointment.      Marvia Pickles, PA-C  [facility] AT [facility] [address] [place] [address] Dept: [phone] Dept Fax: [phone]   No orders of the defined types were placed in this encounter.     CHIEF COMPLAINT:  CC: Stage IIB non-small cell lung cancer  Current Treatment: Adjuvant pembrolizumab every 3 weeks  HISTORY OF PRESENT ILLNESS:    Oncology History  Non-small cell lung cancer, left ([place])  01/15/2021 Initial Diagnosis   Non-small cell lung cancer, left ([place])   02/12/2021 Cancer Staging   Staging form: Lung, AJCC 8th Edition - Clinical stage from 02/12/2021: Stage IA2 (cT1b, cN0, cM0) - Signed by Derwood Kaplan, MD on 02/14/2021 Histopathologic type: Adenocarcinoma, NOS Stage prefix: Initial diagnosis Histologic grade (G): GX Histologic grading system: 4 grade system Laterality: Left Tumor size (mm): 19 Lymph-vascular invasion (LVI): LVI not present (absent)/not identified Diagnostic confirmation: Positive histology Specimen type: Bronchial Biopsy Staged by: Managing physician Stage used in treatment planning: Yes National guidelines used in treatment planning: Yes Type of national guideline used in treatment planning: NCCN Staging comments: Rec surgical resection   05/11/2021 Cancer Staging   Staging form: Lung, AJCC 8th Edition - Pathologic stage from 05/11/2021: Stage IIB (ypT1c, pN1, cM0) - Signed by Derwood Kaplan, MD on 11/12/2021 Histopathologic type: Adenocarcinoma, NOS Stage prefix: Post-therapy Histologic grade (G): G2 Histologic grading system: 4 grade system Residual tumor (R): R0 - None Laterality: Left Tumor size (mm): 25 Lymph-vascular invasion (LVI): LVI not present (absent)/not identified Diagnostic confirmation: Positive histology PLUS positive immunophenotyping and/or positive genetic studies Specimen type: Excision Staged by: Managing physician Type of lung cancer: Surgically resected non-small cell lung cancer ECOG performance status: Grade 1 Perineural invasion (PNI): Absent Pleural/elastic layer invasion: PL0 Pleural lavage cytology: Unknown Weight loss: Absent Adequacy of mediastinal dissection: Adequate Radiotherapy dose: No Adjuvant radiation: No Adjuvant chemotherapy: Yes Stage used in treatment planning: Yes National guidelines  used in treatment planning:  Yes Type of national guideline used in treatment planning: NCCN Staging comments: Adjuvant pembrolizumab, PDL 1 90%   10/04/2021 -  Chemotherapy   Patient is on Treatment Plan : LUNG NSCLC Pembrolizumab Adj q21d x 18 cycles         INTERVAL HISTORY:  Chika is here today for repeat clinical assessment and continues to report cough productive of clear to white sputum with occasional wheezing. She reports stable shortness of breath, but occasional decreased exercise tolerance.  I recommended she use her albuterol more often if needed.  She states she has had trouble lying flat due to increased shortness of breath and chest pressure.  She reports a history of coronary artery disease.  She continues to report daily heartburn for which Tums is effective.  I failed to send in a prescription for omeprazole when I saw her last.  I will do that today.  She will just nausea with eating, for which ondansetron is effective  She denies fevers or chills. She reports left hip and right elbow pain, which is not new. Her appetite is good. Her weight has increased 1 pounds over last 3 weeks .  She states she is allergic to adhesive, but we are still using Tegaderm and it causes itching, redness and swelling of the skin. The Tegaderm being used has an adhesive free window.  I explained that the port needed to be covered during infusion of medications and advised her to use hydrocortisone cream on the area of reaction.  Also, our nurses have a new type of dressing they can try for her.  She continues to smoke marijuana nausea and appetite  REVIEW OF SYSTEMS:  Review of Systems  Constitutional:  Positive for appetite change. Negative for chills, fatigue, fever and unexpected weight change.  HENT:   Negative for lump/mass, mouth sores and sore throat.   Respiratory:  Negative for cough and shortness of breath.   Cardiovascular:  Negative for chest pain and leg swelling.  Gastrointestinal:  Positive for nausea. Negative  for abdominal pain, constipation, diarrhea and vomiting.  Endocrine: Negative for hot flashes.  Genitourinary:  Negative for difficulty urinating, dysuria, frequency and hematuria.   Musculoskeletal:  Positive for arthralgias. Negative for back pain and myalgias.  Skin:  Negative for rash.  Neurological:  Negative for dizziness and headaches.  Hematological:  Negative for adenopathy. Does not bruise/bleed easily.  Psychiatric/Behavioral:  Negative for depression and sleep disturbance. The patient is not nervous/anxious.      VITALS:  Blood pressure 116/82, pulse 68, temperature 98.4 F (36.9 C), temperature source Oral, resp. rate 18, height _0  (1.549 m), weight 135 lb 8 oz (61.5 kg), SpO2 99 %.  Pulse does not increase and pulse ox does not decrease with exercise. Wt Readings from Last 3 Encounters:  12/27/21 135 lb 8 oz (61.5 kg)  12/10/21 134 lb 0.6 oz (60.8 kg)  12/05/21 132 lb 11.2 oz (60.2 kg)    Body mass index is 25.6 kg/m.  Performance status (ECOG): 1 - Symptomatic but completely ambulatory  PHYSICAL EXAM:  Physical Exam Vitals and nursing note reviewed.  Constitutional:      General: She is not in acute distress.    Appearance: Normal appearance.  HENT:     Head: Normocephalic and atraumatic.     Mouth/Throat:     Mouth: Mucous membranes are moist.     Pharynx: Oropharynx is clear. No oropharyngeal exudate or posterior oropharyngeal erythema.  Eyes:  General: No scleral icterus.    Extraocular Movements: Extraocular movements intact.     Conjunctiva/sclera: Conjunctivae normal.     Pupils: Pupils are equal, round, and reactive to light.  Cardiovascular:     Rate and Rhythm: Normal rate and regular rhythm.     Heart sounds: Normal heart sounds. No murmur heard.    No friction rub. No gallop.  Pulmonary:     Effort: Pulmonary effort is normal.     Breath sounds: Examination of the left-lower field reveals decreased breath sounds. Decreased breath sounds  present. No wheezing, rhonchi or rales.  Abdominal:     General: There is no distension.     Palpations: Abdomen is soft. There is no hepatomegaly, splenomegaly or mass.     Tenderness: There is no abdominal tenderness.  Musculoskeletal:        General: Normal range of motion.     Cervical back: Normal range of motion and neck supple. No tenderness.     Right lower leg: No edema.     Left lower leg: No edema.  Lymphadenopathy:     Cervical: No cervical adenopathy.     Upper Body:     Right upper body: No supraclavicular or axillary adenopathy.     Left upper body: No supraclavicular or axillary adenopathy.     Lower Body: No right inguinal adenopathy. No left inguinal adenopathy.  Skin:    General: Skin is warm and dry.     Coloration: Skin is not jaundiced.     Findings: No rash.  Neurological:     Mental Status: She is alert and oriented to person, place, and time.     Cranial Nerves: No cranial nerve deficit.  Psychiatric:        Mood and Affect: Mood normal.        Behavior: Behavior normal.        Thought Content: Thought content normal.     LABS:      Latest Ref Rng & Units 12/27/2021    9:50 AM 12/05/2021    3:02 PM 11/19/2021   12:00 AM  CBC  WBC 4.0 - 10.5 K/uL 12.2  13.8  10.3      Hemoglobin 12.0 - 15.0 g/dL 13.5  13.7  11.7      Hematocrit 36.0 - 46.0 % 40.7  41.2  34      Platelets 150 - 400 K/uL 311  424  319         This result is from an external source.      Latest Ref Rng & Units 12/27/2021    9:50 AM 12/05/2021    3:02 PM 11/19/2021   12:00 AM  CMP  Glucose 70 - 99 mg/dL 94  120    BUN 8 - 23 mg/dL _0 Creatinine 0.44 - 1.00 mg/dL 1.13  1.14  0.9      Sodium 135 - 145 mmol/L 145  142  134      Potassium 3.5 - 5.1 mmol/L 3.7  3.6  2.8      Chloride 98 - 111 mmol/L 103  101  101      CO2 22 - 32 mmol/L 32  29  30      Calcium 8.9 - 10.3 mg/dL 9.8  10.6  9.6      Total Protein 6.5 - 8.1 g/dL 7.9  7.8    Total Bilirubin 0.3 - 1.2  mg/dL  1.1  1.0    Alkaline Phos 38 - 126 U/L 53  75  92      AST 15 - 41 U/L 35  20  27      ALT 0 - 44 U/L _0 This result is from an external source.     Lab Results  Component Value Date   CEA1 2.8 11/08/2021   /  CEA  Date Value Ref Range Status  11/08/2021 2.8 0.0 - 4.7 ng/mL Final    Comment:    (NOTE)                             Nonsmokers          <3.9                             Smokers             <5.6 Roche [employer] Electrochemiluminescence Immunoassay (ECLIA) Values obtained with different assay methods or kits cannot be used interchangeably.  Results cannot be interpreted as absolute evidence of the presence or absence of malignant disease. Performed At: [facility] [place], [place] [id number] Rush Farmer MD [id number]    No results found for: "PSA1" No results found for: "[id number]" No results found for: "CAN125"  No results found for: "TOTALPROTELP", "ALBUMINELP", "A1GS", "A2GS", "BETS", "BETA2SER", "GAMS", "MSPIKE", "SPEI" No results found for: "TIBC", "FERRITIN", "IRONPCTSAT" No results found for: "LDH"  STUDIES:  No results found.    HISTORY:   Past Medical History:  Diagnosis Date   Allergy    Arthritis    Complication of anesthesia    slow to wake up   COPD (chronic obstructive pulmonary disease) (HCC)    GERD (gastroesophageal reflux disease)    Hypertension    Migraines     Past Surgical History:  Procedure Laterality Date   ABDOMINAL HYSTERECTOMY  1989   unsure of vaginal or abdominal   BRONCHIAL BIOPSY  01/15/2021   Procedure: BRONCHIAL BIOPSIES;  Surgeon: Collene Gobble, MD;  Location: [facility];  Service: Pulmonary;;   BRONCHIAL BRUSHINGS  01/15/2021   Procedure: BRONCHIAL BRUSHINGS;  Surgeon: Collene Gobble, MD;  Location: [facility];  Service: Pulmonary;;   BRONCHIAL NEEDLE ASPIRATION BIOPSY  01/15/2021   Procedure: BRONCHIAL NEEDLE ASPIRATION BIOPSIES;  Surgeon: Collene Gobble, MD;  Location: [facility];  Service: Pulmonary;;   COLONOSCOPY  2022   FIDUCIAL MARKER PLACEMENT  01/15/2021   Procedure: FIDUCIAL MARKER PLACEMENT;  Surgeon: Collene Gobble, MD;  Location: [facility];  Service: Pulmonary;;   ovaries removed  1986   surgery to remove scar tissue     from colon/bladder in patient's late 30's   [place]     years ago in patient's late 44s   [place]  01/15/2021   Procedure: [place];  Surgeon: Collene Gobble, MD;  Location: [facility];  Service: Pulmonary;;    Family History  Problem Relation Age of Onset   Pancreatic cancer Maternal Aunt 75   Brain cancer Maternal Uncle    Stomach cancer Maternal Uncle        dx. >50   Prostate cancer Half-Brother        passed away  at 104   Prostate cancer Half-Brother     Social History:  reports that she quit smoking about 7 months ago. Her smoking use included cigarettes. She started smoking about 60 years ago. She has a 29.50 pack-year smoking history. She has never used smokeless tobacco. She reports current alcohol use of about 4.0 standard drinks of alcohol per week. She reports current drug use. Frequency: 3.00 times per week. Drug: Marijuana.The patient is alone today.  Allergies:  Allergies  Allergen Reactions   Codeine Rash   Demerol [Meperidine Hcl] Other (See Comments)    coma   Other Dermatitis    Stainless steel / skin weeps   Silver Dermatitis    Sterling silver skin weeps   Gold Bond [Menthol-Zinc Oxide] Dermatitis    Skin weeps   Neosporin [Bacitracin-Polymyxin B] Dermatitis    Skin weeps   Wound Dressing Adhesive Other (See Comments)    CAUSES SKIN TEARS PER PATIENT. INSTEAD USE THE TEGADERM 77M WITH ADHESIVE FREE WINDOW OVER PORT.    Current Medications: Current Outpatient Medications  Medication Sig Dispense Refill   omeprazole (PRILOSEC) 40  MG capsule Take 1 capsule (40 mg total) by mouth daily. 30 capsule 2   albuterol (VENTOLIN HFA) 108 (90 Base) MCG/ACT inhaler Inhale 2 puffs into the lungs every 4 (four) hours as needed for wheezing or shortness of breath.     amitriptyline (ELAVIL) 10 MG tablet Take 10 mg by mouth at bedtime as needed for sleep.     Ascorbic Acid (VITAMIN C WITH ROSE HIPS) 500 MG tablet Take 500 mg by mouth daily.     aspirin 81 MG EC tablet Take 81 mg by mouth daily.     atorvastatin (LIPITOR) 40 MG tablet Take 40 mg by mouth daily.     b complex vitamins capsule Take 1 capsule by mouth daily. (Patient not taking: Reported on 12/27/2021)     Biotin 5000 MCG CAPS Take by mouth.     bisacodyl (DULCOLAX) 5 MG EC tablet Take 5 mg by mouth 2 (two) times daily. 1-2 tabs for constipation     Calcium-Vitamin D-Vitamin K (CHEWABLE CALCIUM PO) Take by mouth. 650 mg calcium chocolate chews     celecoxib (CELEBREX) 100 MG capsule      gabapentin (NEURONTIN) 300 MG capsule Take 300 mg by mouth at bedtime.     HYDROcodone-acetaminophen (NORCO/VICODIN) 5-325 MG tablet Take 1 tablet by mouth every 6 (six) hours as needed.     lidocaine-prilocaine (EMLA) cream Apply 1 Application topically as needed. 30 g 0   ondansetron (ZOFRAN) 4 MG tablet Take 1 tablet (4 mg total) by mouth every 4 (four) hours as needed for nausea. 90 tablet 3   potassium chloride SA (KLOR-CON M) 20 MEQ tablet Take 20 mEq by mouth daily.     vitamin E 180 MG (400 UNITS) capsule Take 400 Units by mouth daily.     No current facility-administered medications for this visit.

## 2021-12-27 NOTE — Telephone Encounter (Signed)
-----   Message from Marvia Pickles, PA-C sent at 12/27/2021  1:28 PM EST ----- Please let her know labs are good for treatment this week. Thanks

## 2021-12-27 NOTE — Telephone Encounter (Signed)
Message left for patient

## 2021-12-27 NOTE — Telephone Encounter (Signed)
Referral sent 

## 2021-12-28 ENCOUNTER — Encounter: Payer: Self-pay | Admitting: Oncology

## 2021-12-28 MED FILL — Pembrolizumab IV Soln 100 MG/4ML (25 MG/ML): INTRAVENOUS | Qty: 8 | Status: AC

## 2021-12-28 NOTE — Telephone Encounter (Signed)
Referral sent to Dr Ansel Bong.

## 2021-12-31 ENCOUNTER — Inpatient Hospital Stay: Payer: Medicare HMO | Attending: Hematology and Oncology

## 2021-12-31 VITALS — BP 127/70 | HR 72 | Temp 98.2°F | Resp 18 | Wt 138.0 lb

## 2021-12-31 DIAGNOSIS — Z808 Family history of malignant neoplasm of other organs or systems: Secondary | ICD-10-CM | POA: Insufficient documentation

## 2021-12-31 DIAGNOSIS — R0781 Pleurodynia: Secondary | ICD-10-CM | POA: Diagnosis not present

## 2021-12-31 DIAGNOSIS — D72829 Elevated white blood cell count, unspecified: Secondary | ICD-10-CM | POA: Insufficient documentation

## 2021-12-31 DIAGNOSIS — R0602 Shortness of breath: Secondary | ICD-10-CM | POA: Diagnosis not present

## 2021-12-31 DIAGNOSIS — Z800 Family history of malignant neoplasm of digestive organs: Secondary | ICD-10-CM | POA: Insufficient documentation

## 2021-12-31 DIAGNOSIS — C3432 Malignant neoplasm of lower lobe, left bronchus or lung: Secondary | ICD-10-CM | POA: Diagnosis present

## 2021-12-31 DIAGNOSIS — R1110 Vomiting, unspecified: Secondary | ICD-10-CM | POA: Insufficient documentation

## 2021-12-31 DIAGNOSIS — E876 Hypokalemia: Secondary | ICD-10-CM | POA: Insufficient documentation

## 2021-12-31 DIAGNOSIS — Z8042 Family history of malignant neoplasm of prostate: Secondary | ICD-10-CM | POA: Insufficient documentation

## 2021-12-31 DIAGNOSIS — I2510 Atherosclerotic heart disease of native coronary artery without angina pectoris: Secondary | ICD-10-CM | POA: Diagnosis not present

## 2021-12-31 DIAGNOSIS — C3492 Malignant neoplasm of unspecified part of left bronchus or lung: Secondary | ICD-10-CM

## 2021-12-31 DIAGNOSIS — R0789 Other chest pain: Secondary | ICD-10-CM | POA: Diagnosis not present

## 2021-12-31 DIAGNOSIS — Z79899 Other long term (current) drug therapy: Secondary | ICD-10-CM | POA: Diagnosis not present

## 2021-12-31 DIAGNOSIS — Z5112 Encounter for antineoplastic immunotherapy: Secondary | ICD-10-CM | POA: Diagnosis present

## 2021-12-31 DIAGNOSIS — R053 Chronic cough: Secondary | ICD-10-CM | POA: Insufficient documentation

## 2021-12-31 MED ORDER — SODIUM CHLORIDE 0.9% FLUSH
10.0000 mL | INTRAVENOUS | Status: DC | PRN
  Administered 2021-12-31: 10 mL

## 2021-12-31 MED ORDER — HEPARIN SOD (PORK) LOCK FLUSH 100 UNIT/ML IV SOLN
500.0000 [IU] | Freq: Once | INTRAVENOUS | Status: AC | PRN
  Administered 2021-12-31: 500 [IU]

## 2021-12-31 MED ORDER — SODIUM CHLORIDE 0.9 % IV SOLN
200.0000 mg | Freq: Once | INTRAVENOUS | Status: AC
  Administered 2021-12-31: 200 mg via INTRAVENOUS
  Filled 2021-12-31: qty 8

## 2021-12-31 MED ORDER — SODIUM CHLORIDE 0.9 % IV SOLN: Freq: Once | INTRAVENOUS | Status: AC

## 2021-12-31 NOTE — Patient Instructions (Signed)

## 2022-01-09 ENCOUNTER — Other Ambulatory Visit: Payer: Self-pay

## 2022-01-14 LAB — BASIC METABOLIC PANEL
BUN: 11 (ref 4–21)
CO2: 26 — AB (ref 13–22)
Chloride: 96 — AB (ref 99–108)
Creatinine: 1.4 — AB (ref 0.5–1.1)
Glucose: 89
Potassium: 3.6 mEq/L (ref 3.5–5.1)
Sodium: 137 (ref 137–147)

## 2022-01-14 LAB — LIPID PANEL
Cholesterol: 312 — AB (ref 0–200)
HDL: 87 — AB (ref 35–70)
LDL Cholesterol: 198
Triglycerides: 153 (ref 40–160)

## 2022-01-14 LAB — HEPATIC FUNCTION PANEL
ALT: 15 U/L (ref 7–35)
AST: 33 (ref 13–35)
Alkaline Phosphatase: 64 (ref 25–125)
Bilirubin, Total: 1.1

## 2022-01-14 LAB — CBC: RBC: 4.69 (ref 3.87–5.11)

## 2022-01-14 LAB — CBC AND DIFFERENTIAL
HCT: 45 (ref 36–46)
Hemoglobin: 14.6 (ref 12.0–16.0)
Neutrophils Absolute: 6.2
Platelets: 389 10*3/uL (ref 150–400)
WBC: 12.6

## 2022-01-14 LAB — COMPREHENSIVE METABOLIC PANEL
Albumin: 5.1 — AB (ref 3.5–5.0)
Calcium: 9.9 (ref 8.7–10.7)

## 2022-01-15 ENCOUNTER — Other Ambulatory Visit: Payer: Self-pay

## 2022-01-15 DIAGNOSIS — M1990 Unspecified osteoarthritis, unspecified site: Secondary | ICD-10-CM | POA: Insufficient documentation

## 2022-01-15 DIAGNOSIS — T7840XA Allergy, unspecified, initial encounter: Secondary | ICD-10-CM | POA: Insufficient documentation

## 2022-01-15 DIAGNOSIS — G43909 Migraine, unspecified, not intractable, without status migrainosus: Secondary | ICD-10-CM | POA: Insufficient documentation

## 2022-01-15 DIAGNOSIS — I10 Essential (primary) hypertension: Secondary | ICD-10-CM | POA: Insufficient documentation

## 2022-01-15 DIAGNOSIS — J449 Chronic obstructive pulmonary disease, unspecified: Secondary | ICD-10-CM | POA: Insufficient documentation

## 2022-01-15 DIAGNOSIS — T8859XA Other complications of anesthesia, initial encounter: Secondary | ICD-10-CM | POA: Insufficient documentation

## 2022-01-15 DIAGNOSIS — K219 Gastro-esophageal reflux disease without esophagitis: Secondary | ICD-10-CM | POA: Insufficient documentation

## 2022-01-16 ENCOUNTER — Ambulatory Visit: Payer: Medicare HMO | Attending: Cardiology | Admitting: Cardiology

## 2022-01-16 ENCOUNTER — Encounter: Payer: Self-pay | Admitting: Cardiology

## 2022-01-16 VITALS — BP 128/72 | HR 73 | Ht 59.0 in | Wt 138.4 lb

## 2022-01-16 DIAGNOSIS — I7090 Unspecified atherosclerosis: Secondary | ICD-10-CM

## 2022-01-16 DIAGNOSIS — J431 Panlobular emphysema: Secondary | ICD-10-CM

## 2022-01-16 DIAGNOSIS — R0609 Other forms of dyspnea: Secondary | ICD-10-CM

## 2022-01-16 DIAGNOSIS — R011 Cardiac murmur, unspecified: Secondary | ICD-10-CM

## 2022-01-16 HISTORY — DX: Other forms of dyspnea: R06.09

## 2022-01-16 HISTORY — DX: Cardiac murmur, unspecified: R01.1

## 2022-01-16 HISTORY — DX: Unspecified atherosclerosis: I70.90

## 2022-01-16 NOTE — Progress Notes (Signed)
Cardiology Office Note:    Date:  01/16/2022   ID:  Sydney Hunt, DOB 1960/11/12, MRN [id number]  PCP:  Madison Hickman, FNP  Cardiologist:  Jenean Lindau, MD   Referring MD: Marvia Pickles, PA-C    ASSESSMENT:    1. Atherosclerotic vascular disease   2. DOE (dyspnea on exertion)   3. Panlobular emphysema (HCC)   4. Cardiac murmur    PLAN:    In order of problems listed above:  Dyspnea on exertion atherosclerotic vascular disease: CT of the chest reveals aortic atherosclerosis.  She has multiple risk factors for coronary artery disease.  The symptoms might also be from pulmonary pathology.  Nevertheless we will do a Lexiscan sestamibi to assess this.  She is agreeable.  Further recommendations will be made based on the findings of the test. Mixed dyslipidemia: On statin therapy.  Followed by primary care.   Ex-smoker: Promises never to go back to smoking. Lung cancer: Managed by oncology. Renal insufficiency: I reviewed the lab work and discussed this with her.  Precautions advised. Cardiac murmur: Echocardiogram will be done to assess murmur heard auscultation. Patient will be seen in follow-up appointment in 6 months or earlier if the patient has any concerns    Medication Adjustments/Labs and Tests Ordered: Current medicines are reviewed at length with the patient today.  Concerns regarding medicines are outlined above.  Orders Placed This Encounter  Procedures   MYOCARDIAL PERFUSION IMAGING   ECHOCARDIOGRAM COMPLETE   No orders of the defined types were placed in this encounter.    History of Present Illness:    Sydney Hunt is a 61 y.o. female who is being seen today for the evaluation of dyspnea on exertion at the request of Georgann Housekeeper, Thalia Bloodgood, PA-C.  Patient is a pleasant 61 year old female.  She has past medical history of lung cancer for which she has undergone resection and chemotherapy.  She gives history of dyspnea on exertion.  She is an ex-smoker.  She denies  any chest pain orthopnea or PND.  Therefore she is here for evaluation.  She tells me that earlier in the year she underwent stress testing which was abnormal.  I do not have those details.  At the time of my evaluation, the patient is alert awake oriented and in no distress.  Past Medical History:  Diagnosis Date   Allergy    Arthritis    Complication of anesthesia    slow to wake up   COPD (chronic obstructive pulmonary disease) (HCC)    COPD, mild ([place]) 12/15/2020   Family history of pancreatic cancer 11/28/2020   Family history of prostate cancer 11/28/2020   Family history of stomach cancer 11/28/2020   Genetic testing 12/18/2020   [facility] Multi-Cancer Panel was Negative. Report date is 12/12/2020.     The Multi-Cancer + RNA Panel offered by [facility] includes sequencing and/or deletion/duplication analysis of the following 84 genes:  AIP*, ALK, APC*, ATM*, AXIN2*, BAP1*, BARD1*, BLM*, BMPR1A*, BRCA1*, BRCA2*, BRIP1*, CASR, CDC73*, CDH1*, CDK4, CDKN1B*, CDKN1C*, CDKN2A, CEBPA, CHEK2*, CTNNA1*, DICER1*, DIS3L2*, EGFR, EPCAM, FH   GERD (gastroesophageal reflux disease)    Heartburn 12/27/2021   Hormone disorder 11/20/2020   Hypertension    Hypokalemia 12/27/2021   Migraines    Nodule of left lung 11/21/2020   Non-small cell lung cancer, left ([place]) 01/15/2021   Tobacco use 12/07/2020    Past Surgical History:  Procedure Laterality Date   ABDOMINAL HYSTERECTOMY  1989   unsure of vaginal  or abdominal   BRONCHIAL BIOPSY  01/15/2021   Procedure: BRONCHIAL BIOPSIES;  Surgeon: Collene Gobble, MD;  Location: [facility];  Service: Pulmonary;;   BRONCHIAL BRUSHINGS  01/15/2021   Procedure: BRONCHIAL BRUSHINGS;  Surgeon: Collene Gobble, MD;  Location: [facility];  Service: Pulmonary;;   BRONCHIAL NEEDLE ASPIRATION BIOPSY  01/15/2021   Procedure: BRONCHIAL NEEDLE ASPIRATION BIOPSIES;  Surgeon: Collene Gobble, MD;  Location: [facility];  Service: Pulmonary;;   COLONOSCOPY  2022    FIDUCIAL MARKER PLACEMENT  01/15/2021   Procedure: FIDUCIAL MARKER PLACEMENT;  Surgeon: Collene Gobble, MD;  Location: [facility];  Service: Pulmonary;;   ovaries removed  1986   surgery to remove scar tissue     from colon/bladder in patient's late 30's   TUBAL LIGATION  1986   UPPER GI ENDOSCOPY     years ago in patient's late 21s   [place]  01/15/2021   Procedure: [place];  Surgeon: Collene Gobble, MD;  Location: [facility];  Service: Pulmonary;;    Current Medications: Current Meds  Medication Sig   albuterol (VENTOLIN HFA) 108 (90 Base) MCG/ACT inhaler Inhale 2 puffs into the lungs every 4 (four) hours as needed for wheezing or shortness of breath.   amitriptyline (ELAVIL) 10 MG tablet Take 10 mg by mouth at bedtime as needed for sleep.   Ascorbic Acid (VITAMIN C WITH ROSE HIPS) 500 MG tablet Take 500 mg by mouth daily.   aspirin 81 MG EC tablet Take 81 mg by mouth daily.   atorvastatin (LIPITOR) 40 MG tablet Take 40 mg by mouth daily.   b complex vitamins capsule Take 1 capsule by mouth daily.   Biotin 5000 MCG CAPS Take 5,000 mcg by mouth daily.   Calcium-Vitamin D-Vitamin K (CHEWABLE CALCIUM PO) Take 650 mg by mouth daily.   celecoxib (CELEBREX) 100 MG capsule Take 100 mg by mouth daily.   gabapentin (NEURONTIN) 300 MG capsule Take 300 mg by mouth at bedtime.   HYDROcodone-acetaminophen (NORCO/VICODIN) 5-325 MG tablet Take 1 tablet by mouth every 6 (six) hours as needed for moderate pain or severe pain.   lidocaine-prilocaine (EMLA) cream Apply 1 Application topically as needed.   omeprazole (PRILOSEC) 40 MG capsule Take 1 capsule (40 mg total) by mouth daily.   ondansetron (ZOFRAN) 4 MG tablet Take 1 tablet (4 mg total) by mouth every 4 (four) hours as needed for nausea.   potassium chloride SA (KLOR-CON M) 20 MEQ tablet Take 20 mEq by mouth daily.   vitamin E 180 MG (400  UNITS) capsule Take 400 Units by mouth daily.     Allergies:   Codeine, Demerol [meperidine hcl], Other, Silver, Gold bond [menthol-zinc oxide], Neosporin [bacitracin-polymyxin b], and Wound dressing adhesive   Social History   Socioeconomic History   Marital status: Widowed    Spouse name: Not on file   Number of children: 2   Years of education: Not on file   Highest education level: Not on file  Occupational History   Occupation: unemployed  Tobacco Use   Smoking status: Former    Packs/day: 0.50    Years: 59.00    Total pack years: 29.50    Types: Cigarettes    Start date: 1963    Quit date: 04/2021    Years since quitting: 0.7   Smokeless tobacco: Never   Tobacco comments:    10 cigarettes smoked daily [facility] 12/15/20  Vaping  Use   Vaping Use: Never used  Substance and Sexual Activity   Alcohol use: Yes    Alcohol/week: 4.0 standard drinks of alcohol    Types: 1 Glasses of wine, 3 Shots of liquor per week    Comment: occasional   Drug use: Yes    Frequency: 3.0 times per week    Types: Marijuana    Comment: current use   Sexual activity: Not Currently    Birth control/protection: Post-menopausal  Other Topics Concern   Not on file  Social History Narrative   Not on file   Social Determinants of Health   Financial Resource Strain: Not on file  Food Insecurity: Not on file  Transportation Needs: Not on file  Physical Activity: Not on file  Stress: Not on file  Social Connections: Not on file     Family History: The patient's family history includes Brain cancer in her maternal uncle; Pancreatic cancer (age of onset: 61) in her maternal aunt; Prostate cancer in her half-brother and half-brother; Stomach cancer in her maternal uncle.  ROS:   Please see the history of present illness.    All other systems reviewed and are negative.  EKGs/Labs/Other Studies Reviewed:    The following studies were reviewed today: EKG reveals sinus rhythm and nonspecific ST  changes T wave inversions in inferolateral leads.   Recent Labs: 11/19/2021: TSH 0.016 12/27/2021: ALT 20; BUN 13; Creatinine 1.13; Hemoglobin 13.5; Platelet Count 311; Potassium 3.7; Sodium 145  Recent Lipid Panel No results found for: "CHOL", "TRIG", "HDL", "CHOLHDL", "VLDL", "LDLCALC", "LDLDIRECT"  Physical Exam:    VS:  BP 128/72   Pulse 73   Ht _0  (1.499 m)   Wt 138 lb 6.4 oz (62.8 kg)   LMP  (LMP Unknown)   SpO2 95%   BMI 27.95 kg/m     Wt Readings from Last 3 Encounters:  01/16/22 138 lb 6.4 oz (62.8 kg)  12/31/21 138 lb (62.6 kg)  12/27/21 135 lb 8 oz (61.5 kg)     GEN: Patient is in no acute distress HEENT: Normal NECK: No JVD; No carotid bruits LYMPHATICS: No lymphadenopathy CARDIAC: S1 S2 regular, 2/6 systolic murmur at the apex. RESPIRATORY:  Clear to auscultation without rales, wheezing or rhonchi  ABDOMEN: Soft, non-tender, non-distended MUSCULOSKELETAL:  No edema; No deformity  SKIN: Warm and dry NEUROLOGIC:  Alert and oriented x 3 PSYCHIATRIC:  Normal affect    Signed, Jenean Lindau, MD  01/16/2022 10:54 AM    [place]

## 2022-01-16 NOTE — Patient Instructions (Signed)
Medication Instructions:  Your physician recommends that you continue on your current medications as directed. Please refer to the Current Medication list given to you today.  *If you need a refill on your cardiac medications before your next appointment, please call your pharmacy*   Lab Work: None ordered If you have labs (blood work) drawn today and your tests are completely normal, you will receive your results only by: [place] (if you have MyChart) OR A paper copy in the mail If you have any lab test that is abnormal or we need to change your treatment, we will call you to review the results.   Testing/Procedures: Your physician has requested that you have a lexiscan myoview. For further information please visit [email or web address]. Please follow instruction sheet, as given.  The test will take approximately 3 to 4 hours to complete; you may bring reading material.  If someone comes with you to your appointment, they will need to remain in the main lobby due to limited space in the testing area.   How to prepare for your Myocardial Perfusion Test: Do not eat or drink 3 hours prior to your test, except you may have water. Do not consume products containing caffeine (regular or decaffeinated) 12 hours prior to your test. (ex: coffee, chocolate, sodas, tea). Do bring a list of your current medications with you.  If not listed below, you may take your medications as normal. Do wear comfortable clothes (no dresses or overalls) and walking shoes, tennis shoes preferred (No heels or open toe shoes are allowed). Do NOT wear cologne, perfume, aftershave, or lotions (deodorant is allowed). If these instructions are not followed, your test will have to be rescheduled.  Your physician has requested that you have an echocardiogram. Echocardiography is a painless test that uses sound waves to create images of your heart. It provides your doctor with information about the size and shape of  your heart and how well your heart's chambers and valves are working. This procedure takes approximately one hour. There are no restrictions for this procedure.   Follow-Up: At [facility], you and your health needs are our priority.  As part of our continuing mission to provide you with exceptional heart care, we have created designated Provider Care Teams.  These Care Teams include your primary Cardiologist (physician) and Advanced Practice Providers (APPs -  Physician Assistants and Nurse Practitioners) who all work together to provide you with the care you need, when you need it.  We recommend signing up for the patient portal called "MyChart".  Sign up information is provided on this After Visit Summary.  MyChart is used to connect with patients for Virtual Visits (Telemedicine).  Patients are able to view lab/test results, encounter notes, upcoming appointments, etc.  Non-urgent messages can be sent to your provider as well.   To learn more about what you can do with MyChart, go to [email or web address].    Your next appointment:   9 month(s)  The format for your next appointment:   In Person  Provider:   Jyl Heinz, MD   Other Instructions Cardiac Nuclear Scan A cardiac nuclear scan is a test that is done to check the flow of blood to your heart. It is done when you are resting and when you are exercising. The test looks for problems such as: Not enough blood reaching a portion of the heart. The heart muscle not working as it should. You may need this test if: You have heart disease. You have  had lab results that are not normal. You have had heart surgery or a balloon procedure to open up blocked arteries (angioplasty). You have chest pain. You have shortness of breath. In this test, a special dye (tracer) is put into your bloodstream. The tracer will travel to your heart. A camera will then take pictures of your heart to see how the tracer moves through your heart. This  test is usually done at a hospital and takes 2-4 hours. Tell a doctor about: Any allergies you have. All medicines you are taking, including vitamins, herbs, eye drops, creams, and over-the-counter medicines. Any problems you or family members have had with anesthetic medicines. Any blood disorders you have. Any surgeries you have had. Any medical conditions you have. Whether you are pregnant or may be pregnant. What are the risks? Generally, this is a safe test. However, problems may occur, such as: Serious chest pain and heart attack. This is only a risk if the stress portion of the test is done. Rapid heartbeat. A feeling of warmth in your chest. This feeling usually does not last long. Allergic reaction to the tracer. What happens before the test? Ask your doctor about changing or stopping your normal medicines. This is important. Follow instructions from your doctor about what you cannot eat or drink. Remove your jewelry on the day of the test. What happens during the test? An IV tube will be inserted into one of your veins. Your doctor will give you a small amount of tracer through the IV tube. You will wait for 20-40 minutes while the tracer moves through your bloodstream. Your heart will be monitored with an electrocardiogram (ECG). You will lie down on an exam table. Pictures of your heart will be taken for about 15-20 minutes. You may also have a stress test. For this test, one of these things may be done: You will be asked to exercise on a treadmill or a stationary bike. You will be given medicines that will make your heart work harder. This is done if you are unable to exercise. When blood flow to your heart has peaked, a tracer will again be given through the IV tube. After 20-40 minutes, you will get back on the exam table. More pictures will be taken of your heart. Depending on the tracer that is used, more pictures may need to be taken 3-4 hours later. Your IV tube  will be removed when the test is over. The test may vary among doctors and hospitals. What happens after the test? Ask your doctor: Whether you can return to your normal schedule, including diet, activities, and medicines. Whether you should drink more fluids. This will help to remove the tracer from your body. Drink enough fluid to keep your pee (urine) pale yellow. Ask your doctor, or the department that is doing the test: When will my results be ready? How will I get my results? Summary A cardiac nuclear scan is a test that is done to check the flow of blood to your heart. Tell your doctor whether you are pregnant or may be pregnant. Before the test, ask your doctor about changing or stopping your normal medicines. This is important. Ask your doctor whether you can return to your normal activities. You may be asked to drink more fluids. This information is not intended to replace advice given to you by your health care provider. Make sure you discuss any questions you have with your health care provider. Document Revised: 05/06/2018 Document Reviewed: 06/30/2017  Elsevier Patient Education  2021 [place].    Echocardiogram An echocardiogram is a test that uses sound waves (ultrasound) to produce images of the heart. Images from an echocardiogram can provide important information about: Heart size and shape. The size and thickness and movement of your heart's walls. Heart muscle function and strength. Heart valve function or if you have stenosis. Stenosis is when the heart valves are too narrow. If blood is flowing backward through the heart valves (regurgitation). A tumor or infectious growth around the heart valves. Areas of heart muscle that are not working well because of poor blood flow or injury from a heart attack. Aneurysm detection. An aneurysm is a weak or damaged part of an artery wall. The wall bulges out from the normal force of blood pumping through the body. Tell a  health care provider about: Any allergies you have. All medicines you are taking, including vitamins, herbs, eye drops, creams, and over-the-counter medicines. Any blood disorders you have. Any surgeries you have had. Any medical conditions you have. Whether you are pregnant or may be pregnant. What are the risks? Generally, this is a safe test. However, problems may occur, including an allergic reaction to dye (contrast) that may be used during the test. What happens before the test? No specific preparation is needed. You may eat and drink normally. What happens during the test? You will take off your clothes from the waist up and put on a hospital gown. Electrodes or electrocardiogram (ECG)patches may be placed on your chest. The electrodes or patches are then connected to a device that monitors your heart rate and rhythm. You will lie down on a table for an ultrasound exam. A gel will be applied to your chest to help sound waves pass through your skin. A handheld device, called a transducer, will be pressed against your chest and moved over your heart. The transducer produces sound waves that travel to your heart and bounce back (or "echo" back) to the transducer. These sound waves will be captured in real-time and changed into images of your heart that can be viewed on a video monitor. The images will be recorded on a computer and reviewed by your health care provider. You may be asked to change positions or hold your breath for a short time. This makes it easier to get different views or better views of your heart. In some cases, you may receive contrast through an IV in one of your veins. This can improve the quality of the pictures from your heart. The procedure may vary among health care providers and hospitals.    What can I expect after the test? You may return to your normal, everyday life, including diet, activities, and medicines, unless your health care provider tells you not to do  that. Follow these instructions at home: It is up to you to get the results of your test. Ask your health care provider, or the department that is doing the test, when your results will be ready. Keep all follow-up visits. This is important. Summary An echocardiogram is a test that uses sound waves (ultrasound) to produce images of the heart. Images from an echocardiogram can provide important information about the size and shape of your heart, heart muscle function, heart valve function, and other possible heart problems. You do not need to do anything to prepare before this test. You may eat and drink normally. After the echocardiogram is completed, you may return to your normal, everyday life, unless your  health care provider tells you not to do that. This information is not intended to replace advice given to you by your health care provider. Make sure you discuss any questions you have with your health care provider. Document Revised: 09/07/2019 Document Reviewed: 09/07/2019 Elsevier Patient Education  2021 [employer].

## 2022-01-17 ENCOUNTER — Other Ambulatory Visit: Payer: Self-pay

## 2022-01-17 ENCOUNTER — Encounter: Payer: Self-pay | Admitting: Hematology and Oncology

## 2022-01-17 ENCOUNTER — Telehealth: Payer: Self-pay

## 2022-01-17 ENCOUNTER — Inpatient Hospital Stay: Payer: Medicare HMO

## 2022-01-17 ENCOUNTER — Inpatient Hospital Stay (INDEPENDENT_AMBULATORY_CARE_PROVIDER_SITE_OTHER): Payer: Medicare HMO | Admitting: Hematology and Oncology

## 2022-01-17 VITALS — BP 138/83 | HR 63 | Temp 98.6°F | Resp 18 | Ht 61.0 in | Wt 139.9 lb

## 2022-01-17 DIAGNOSIS — Z5112 Encounter for antineoplastic immunotherapy: Secondary | ICD-10-CM | POA: Diagnosis not present

## 2022-01-17 DIAGNOSIS — E876 Hypokalemia: Secondary | ICD-10-CM | POA: Diagnosis not present

## 2022-01-17 DIAGNOSIS — D72829 Elevated white blood cell count, unspecified: Secondary | ICD-10-CM | POA: Diagnosis not present

## 2022-01-17 DIAGNOSIS — C3492 Malignant neoplasm of unspecified part of left bronchus or lung: Secondary | ICD-10-CM | POA: Diagnosis not present

## 2022-01-17 HISTORY — DX: Elevated white blood cell count, unspecified: D72.829

## 2022-01-17 LAB — CMP (CANCER CENTER ONLY)
ALT: 19 U/L (ref 0–44)
AST: 35 U/L (ref 15–41)
Albumin: 4.8 g/dL (ref 3.5–5.0)
Alkaline Phosphatase: 56 U/L (ref 38–126)
Anion gap: 9 (ref 5–15)
BUN: 12 mg/dL (ref 8–23)
CO2: 29 mmol/L (ref 22–32)
Calcium: 10 mg/dL (ref 8.9–10.3)
Chloride: 103 mmol/L (ref 98–111)
Creatinine: 1.26 mg/dL — ABNORMAL HIGH (ref 0.44–1.00)
GFR, Estimated: 48 mL/min — ABNORMAL LOW (ref >60)
Glucose, Bld: 74 mg/dL (ref 70–99)
Potassium: 3.2 mmol/L — ABNORMAL LOW (ref 3.5–5.1)
Sodium: 141 mmol/L (ref 135–145)
Total Bilirubin: 0.9 mg/dL (ref 0.3–1.2)
Total Protein: 8.1 g/dL (ref 6.5–8.1)

## 2022-01-17 LAB — CBC AND DIFFERENTIAL
HCT: 40 (ref 36–46)
Hemoglobin: 13.6 (ref 12.0–16.0)
MCV: 92 (ref 81–99)
Neutrophils Absolute: 5.64
Platelets: 318 10*3/uL (ref 150–400)
WBC: 11.5

## 2022-01-17 LAB — CBC: RBC: 4.29 (ref 3.87–5.11)

## 2022-01-17 LAB — TSH: TSH: 221.434 u[IU]/mL — ABNORMAL HIGH (ref 0.350–4.500)

## 2022-01-17 LAB — CBC W DIFFERENTIAL (~~LOC~~ CC SCANNED REPORT)

## 2022-01-17 MED ORDER — LEVOTHYROXINE SODIUM 50 MCG PO TABS: 50.0000 ug | ORAL_TABLET | Freq: Every day | ORAL | 2 refills | Status: DC

## 2022-01-17 MED ORDER — PROCHLORPERAZINE MALEATE 10 MG PO TABS: 10.0000 mg | ORAL_TABLET | Freq: Four times a day (QID) | ORAL | 2 refills | Status: DC | PRN

## 2022-01-17 MED ORDER — HYDROCODONE-ACETAMINOPHEN 5-325 MG PO TABS: 1.0000 | ORAL_TABLET | Freq: Four times a day (QID) | ORAL | 0 refills | Status: DC | PRN

## 2022-01-17 NOTE — Assessment & Plan Note (Signed)
Chronic leukocytosis of uncertain etiology.  She does not have more symptoms of infection.  Chest x-ray was normal today.

## 2022-01-17 NOTE — Telephone Encounter (Signed)
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 I am sending in thyroid replacement to start. She should take this first thing in the morning before she eats for best absorption. Her kidney test is better than at PCP. Her potassium is low, if she is not taking potassium, she needs to resume, if she is taking have her increase to twice daily. Her chest xray looked fine. Thanks

## 2022-01-17 NOTE — Telephone Encounter (Signed)
Spoke with the patient.Detailed instructions given. She stated that she would be here for her test. Asked to call back with any questions. S.Lucynda Rosano EMTP

## 2022-01-17 NOTE — Addendum Note (Signed)
Addended by: Jerl Santos R on: 01/17/2022 10:27 AM   Modules accepted: Orders

## 2022-01-17 NOTE — Progress Notes (Addendum)
[place]  [address] [place],  [place]  [address] [phone]   Addendum: Her TSH came back significantly elevated at 221 with a low T4 of less than 0.4.  I started her on levothyroxine 50 mcg in the morning prior to breakfast.  We will plan to follow the TSH and T4 closely.  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  She was treated with left lower lobectomy in [place].  Her doctor there recommended chemotherapy with immunotherapy, but she refused chemotherapy.  PD-L1 was positive.  She was only willing to take the immunotherapy for 1 year and had a port placed.  She started pembrolizumab in September, then relocated to [place].  We saw her to establish care in October and have continued pembrolizumab every 21 days. We obtained a CT chest in October, which revealed new indistinct clustered lingular pulmonary nodules the largest measuring 6 mm, which were indeterminate.  Follow-up CT chest in 3 months was recommended.  She reported orthopnea with chest discomfort with a history of coronary artery disease and has seen Dr. Geraldo Pitter.  She is scheduled for an echocardiogram and stress test on January 3.  She reports persistent shortness of breath with chronic cough productive of clear sputum.  Chest x-ray today is unremarkable.  She has had increased nausea and vomiting.  I will add prochlorperazine as needed.  She did not want to increase her omeprazole to twice daily.  She will proceed with a 6th cycle of pembrolizumab next week.  We will plan to see her back in 3 weeks prior to a 7th cycle.  Hypokalemia Mild hypokalemia, likely due to hydrochlorothiazide.  We will confirm that she is taking her potassium chloride once a day, and if so we will have her increase that to twice  daily.  Leukocytosis Chronic leukocytosis of uncertain etiology.  She does not have more symptoms of infection.  Chest x-ray was normal today.   The patient understands the plans discussed today and is in agreement with them.  She knows to contact our office if she develops concerns prior to her next appointment.   I provided 25 minutes of face-to-face time during this encounter and > 50% was spent counseling as documented under my assessment and plan.    Marvia Pickles, PA-C  [facility] [address] [place] [address] Dept: [phone] Dept Fax: [phone]   Orders Placed This Encounter  Procedures   DG Chest 2 View    Standing Status:   Future    Standing Expiration Date:   01/17/2023    Order Specific Question:   Reason for Exam (SYMPTOM  OR DIAGNOSIS REQUIRED)    Answer:   increased difficulty breathing    Order Specific Question:   Preferred imaging location?    Answer:   External   CBC and differential    This external order was created through the Results Console.   CBC    This external order was created through the Results Console.      CHIEF COMPLAINT:  CC: Stage IA2 adenocarcinoma of the lung  Current Treatment: Maintenance pembrolizumab every 3 weeks  HISTORY OF PRESENT ILLNESS:   Oncology History  Non-small cell lung cancer, left ([place])  01/15/2021 Initial Diagnosis   Non-small cell  lung cancer, left ([place])   02/12/2021 Cancer Staging   Staging form: Lung, AJCC 8th Edition - Clinical stage from 02/12/2021: Stage IA2 (cT1b, cN0, cM0) - Signed by Derwood Kaplan, MD on 02/14/2021 Histopathologic type: Adenocarcinoma, NOS Stage prefix: Initial diagnosis Histologic grade (G): GX Histologic grading system: 4 grade system Laterality: Left Tumor size (mm): 19 Lymph-vascular invasion (LVI): LVI not present (absent)/not identified Diagnostic confirmation: Positive histology Specimen  type: Bronchial Biopsy Staged by: Managing physician Stage used in treatment planning: Yes National guidelines used in treatment planning: Yes Type of national guideline used in treatment planning: NCCN Staging comments: Rec surgical resection   05/11/2021 Cancer Staging   Staging form: Lung, AJCC 8th Edition - Pathologic stage from 05/11/2021: Stage IIB (ypT1c, pN1, cM0) - Signed by Derwood Kaplan, MD on 11/12/2021 Histopathologic type: Adenocarcinoma, NOS Stage prefix: Post-therapy Histologic grade (G): G2 Histologic grading system: 4 grade system Residual tumor (R): R0 - None Laterality: Left Tumor size (mm): 25 Lymph-vascular invasion (LVI): LVI not present (absent)/not identified Diagnostic confirmation: Positive histology PLUS positive immunophenotyping and/or positive genetic studies Specimen type: Excision Staged by: Managing physician Type of lung cancer: Surgically resected non-small cell lung cancer ECOG performance status: Grade 1 Perineural invasion (PNI): Absent Pleural/elastic layer invasion: PL0 Pleural lavage cytology: Unknown Weight loss: Absent Adequacy of mediastinal dissection: Adequate Radiotherapy dose: No Adjuvant radiation: No Adjuvant chemotherapy: Yes Stage used in treatment planning: Yes National guidelines used in treatment planning: Yes Type of national guideline used in treatment planning: NCCN Staging comments: Adjuvant pembrolizumab, PDL 1 90%   10/04/2021 -  Chemotherapy   Patient is on Treatment Plan : LUNG NSCLC Pembrolizumab Adj q21d x 18 cycles         INTERVAL HISTORY:  Atalia is here today for repeat clinical assessment prior to his sixth cycle of pembrolizumab.  She has been feeling more fatigued, likely due to her treatment.  She reports chronic nausea with intermittent vomiting.  She is only taking ondansetron as she states she does not have prochlorperazine.  She reports pain of the left lower ribs, for which she is using  hydrocodone/APAP occasionally.  She request a refill of that today.  She denies fevers or chills. Her appetite is decreased. Her weight has increased 1 pounds over last 3 weeks .  REVIEW OF SYSTEMS:  Review of Systems  Constitutional:  Positive for appetite change and fatigue. Negative for chills, fever and unexpected weight change.  HENT:   Negative for lump/mass, mouth sores and sore throat.   Respiratory:  Negative for cough and shortness of breath.   Cardiovascular:  Negative for chest pain and leg swelling.  Gastrointestinal:  Positive for nausea and vomiting. Negative for abdominal pain, constipation and diarrhea.  Endocrine: Negative for hot flashes.  Genitourinary:  Negative for difficulty urinating, dysuria, frequency and hematuria.   Musculoskeletal:  Negative for arthralgias, back pain and myalgias.  Skin:  Negative for rash.  Neurological:  Negative for dizziness and headaches.  Hematological:  Negative for adenopathy. Does not bruise/bleed easily.  Psychiatric/Behavioral:  Negative for depression and sleep disturbance. The patient is not nervous/anxious.      VITALS:  Blood pressure 138/83, pulse 63, temperature 98.6 F (37 C), temperature source Oral, resp. rate 18, height _0  (1.549 m), weight 139 lb 14.4 oz (63.5 kg), SpO2 99 %.  Wt Readings from Last 3 Encounters:  01/17/22 139 lb 14.4 oz (63.5 kg)  01/16/22 138 lb 6.4 oz (62.8 kg)  12/31/21 138  lb (62.6 kg)    Body mass index is 26.43 kg/m.  Performance status (ECOG): 1 - Symptomatic but completely ambulatory  PHYSICAL EXAM:  Physical Exam Vitals and nursing note reviewed.  Constitutional:      General: She is not in acute distress.    Appearance: Normal appearance.  HENT:     Head: Normocephalic and atraumatic.     Mouth/Throat:     Mouth: Mucous membranes are moist.     Pharynx: Oropharynx is clear. No oropharyngeal exudate or posterior oropharyngeal erythema.  Eyes:     General: No scleral icterus.     Extraocular Movements: Extraocular movements intact.     Conjunctiva/sclera: Conjunctivae normal.     Pupils: Pupils are equal, round, and reactive to light.  Cardiovascular:     Rate and Rhythm: Normal rate and regular rhythm.     Heart sounds: Normal heart sounds. No murmur heard.    No friction rub. No gallop.  Pulmonary:     Effort: Pulmonary effort is normal.     Breath sounds: Normal breath sounds. No wheezing, rhonchi or rales.  Chest:     Chest wall: No mass, swelling or tenderness.  Abdominal:     General: There is no distension.     Palpations: Abdomen is soft. There is no hepatomegaly, splenomegaly or mass.     Tenderness: There is no abdominal tenderness.  Musculoskeletal:        General: Normal range of motion.     Cervical back: Normal range of motion and neck supple. No tenderness.     Right lower leg: No edema.     Left lower leg: No edema.  Lymphadenopathy:     Cervical: No cervical adenopathy.     Upper Body:     Right upper body: No supraclavicular or axillary adenopathy.     Left upper body: No supraclavicular or axillary adenopathy.     Lower Body: No right inguinal adenopathy. No left inguinal adenopathy.  Skin:    General: Skin is warm and dry.     Coloration: Skin is not jaundiced.     Findings: No rash.  Neurological:     Mental Status: She is alert and oriented to person, place, and time.     Cranial Nerves: No cranial nerve deficit.  Psychiatric:        Mood and Affect: Mood normal.        Behavior: Behavior normal.        Thought Content: Thought content normal.     LABS:      Latest Ref Rng & Units 01/17/2022   12:00 AM 01/14/2022   12:00 AM 12/27/2021    9:50 AM  CBC  WBC  11.5     12.6     12.2   Hemoglobin 12.0 - 16.0 13.6     14.6     13.5   Hematocrit 36 - 46 40     45     40.7   Platelets 150 - 400 K/uL 318     389     311      This result is from an external source.      Latest Ref Rng & Units 01/17/2022    9:41 AM  01/14/2022   12:00 AM 12/27/2021    9:50 AM  CMP  Glucose 70 - 99 mg/dL 74   94   BUN 8 - 23 mg/dL _0 Creatinine  0.44 - 1.00 mg/dL 1.26  1.4     1.13   Sodium 135 - 145 mmol/L 141  137     145   Potassium 3.5 - 5.1 mmol/L 3.2  3.6     3.7   Chloride 98 - 111 mmol/L 103  96     103   CO2 22 - 32 mmol/L 29  26     32   Calcium 8.9 - 10.3 mg/dL 10.0  9.9     9.8   Total Protein 6.5 - 8.1 g/dL 8.1   7.9   Total Bilirubin 0.3 - 1.2 mg/dL 0.9   1.1   Alkaline Phos 38 - 126 U/L 56  64     53   AST 15 - 41 U/L 35  33     35   ALT 0 - 44 U/L _0 This result is from an external source.     Lab Results  Component Value Date   CEA1 2.8 11/08/2021   /  CEA  Date Value Ref Range Status  11/08/2021 2.8 0.0 - 4.7 ng/mL Final    Comment:    (NOTE)                             Nonsmokers          <3.9                             Smokers             <5.6 [employer] Electrochemiluminescence Immunoassay (ECLIA) Values obtained with different assay methods or kits cannot be used interchangeably.  Results cannot be interpreted as absolute evidence of the presence or absence of malignant disease. 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 Report date is 12/12/2020.     The Multi-Cancer + RNA Panel offered by [employer] includes sequencing and/or deletion/duplication analysis of the following 84 genes:  AIP*, ALK, APC*, ATM*, AXIN2*, BAP1*, BARD1*,  BLM*, BMPR1A*, BRCA1*, BRCA2*, BRIP1*, CASR, CDC73*, CDH1*, CDK4, CDKN1B*, CDKN1C*, CDKN2A, CEBPA, CHEK2*, CTNNA1*, DICER1*, DIS3L2*, EGFR, EPCAM, FH   GERD (gastroesophageal reflux disease)    Heartburn 12/27/2021   Hormone disorder 11/20/2020   Hypertension    Hypokalemia 12/27/2021   Leukocytosis 01/17/2022   Migraines    Nodule of left lung 11/21/2020   Non-small cell lung cancer, left ([place]) 01/15/2021   Tobacco use 12/07/2020    Past Surgical History:  Procedure Laterality Date   ABDOMINAL HYSTERECTOMY  1989   unsure of vaginal or abdominal   BRONCHIAL BIOPSY  01/15/2021   Procedure: BRONCHIAL BIOPSIES;  Surgeon: Collene Gobble, MD;  Location[facility] ENDOSCOPY;  Service: Pulmonary;;   BRONCHIAL BRUSHINGS  01/15/2021   Procedure: BRONCHIAL BRUSHINGS;  Surgeon: Baltazar Apo  S, MD;  Location: [place];  Service: Pulmonary;;   BRONCHIAL NEEDLE ASPIRATION BIOPSY  01/15/2021   Procedure: BRONCHIAL NEEDLE ASPIRATION BIOPSIES;  Surgeon: Collene Gobble, MD;  Location: [facility] ENDOSCOPY;  Service: Pulmonary;;   COLONOSCOPY  2022   FIDUCIAL MARKER PLACEMENT  01/15/2021   Procedure: FIDUCIAL MARKER PLACEMENT;  Surgeon: Collene Gobble, MD;  Location: [facility] ENDOSCOPY;  Service: Pulmonary;;   ovaries removed  1986   surgery to remove scar tissue     from colon/bladder in patient's late 30's   TUBAL LIGATION  1986   UPPER GI ENDOSCOPY     years ago in patient's late 62s   VIDEO BRONCHOSCOPY WITH RADIAL ENDOBRONCHIAL ULTRASOUND  01/15/2021   Procedure: Wisdom;  Surgeon: Collene Gobble, MD;  Location: [facility] ENDOSCOPY;  Service: Pulmonary;;    Family History  Problem Relation Age of Onset   Pancreatic cancer Maternal Aunt 52   Brain cancer Maternal Uncle    Stomach cancer Maternal Uncle        dx. >50   Prostate cancer Half-Brother        passed away at 86   Prostate cancer Half-Brother     Social History:  reports that she quit smoking  about 8 months ago. Her smoking use included cigarettes. She started smoking about 61 years ago. She has a 29.50 pack-year smoking history. She has never used smokeless tobacco. She reports current alcohol use of about 4.0 standard drinks of alcohol per week. She reports current drug use. Frequency: 3.00 times per week. Drug: Marijuana.The patient is alone today.  Allergies:  Allergies  Allergen Reactions   Codeine Rash   Demerol [Meperidine Hcl] Other (See Comments)    coma   Other Dermatitis    Stainless steel / skin weeps   Silver Dermatitis    Sterling silver skin weeps   Gold Bond [Menthol-Zinc Oxide] Dermatitis    Skin weeps   Neosporin [Bacitracin-Polymyxin B] Dermatitis    Skin weeps   Wound Dressing Adhesive Other (See Comments)    CAUSES SKIN TEARS PER PATIENT. INSTEAD USE THE TEGADERM 73M WITH ADHESIVE FREE WINDOW OVER PORT.    Current Medications: Current Outpatient Medications  Medication Sig Dispense Refill   celecoxib (CELEBREX) 200 MG capsule Take 200 mg by mouth daily.     hydrochlorothiazide (HYDRODIURIL) 25 MG tablet Take 25 mg by mouth every morning.     levothyroxine (SYNTHROID) 50 MCG tablet Take 1 tablet (50 mcg total) by mouth daily before breakfast. 30 tablet 2   prochlorperazine (COMPAZINE) 10 MG tablet Take 1 tablet (10 mg total) by mouth every 6 (six) hours as needed for nausea or vomiting. 30 tablet 2   albuterol (VENTOLIN HFA) 108 (90 Base) MCG/ACT inhaler Inhale 2 puffs into the lungs every 4 (four) hours as needed for wheezing or shortness of breath.     amitriptyline (ELAVIL) 10 MG tablet Take 10 mg by mouth at bedtime as needed for sleep.     Ascorbic Acid (VITAMIN C WITH ROSE HIPS) 500 MG tablet Take 500 mg by mouth daily.     aspirin 81 MG EC tablet Take 81 mg by mouth daily.     atorvastatin (LIPITOR) 40 MG tablet Take 40 mg by mouth daily.     b complex vitamins capsule Take 1 capsule by mouth daily. (Patient not taking: Reported on 01/17/2022)      Biotin 5000 MCG CAPS Take 5,000 mcg by mouth daily.  Calcium-Vitamin D-Vitamin K (CHEWABLE CALCIUM PO) Take 650 mg by mouth daily.     gabapentin (NEURONTIN) 300 MG capsule Take 300 mg by mouth at bedtime.     HYDROcodone-acetaminophen (NORCO/VICODIN) 5-325 MG tablet Take 1 tablet by mouth every 6 (six) hours as needed for moderate pain or severe pain. 30 tablet 0   lidocaine-prilocaine (EMLA) cream Apply 1 Application topically as needed. 30 g 0   omeprazole (PRILOSEC) 40 MG capsule Take 1 capsule (40 mg total) by mouth daily. 30 capsule 2   ondansetron (ZOFRAN) 4 MG tablet Take 1 tablet (4 mg total) by mouth every 4 (four) hours as needed for nausea. 90 tablet 3   potassium chloride SA (KLOR-CON M) 20 MEQ tablet Take 20 mEq by mouth daily.     vitamin E 180 MG (400 UNITS) capsule Take 400 Units by mouth daily. (Patient not taking: Reported on 01/17/2022)     No current facility-administered medications for this visit.

## 2022-01-17 NOTE — Assessment & Plan Note (Signed)
Mild hypokalemia, likely due to hydrochlorothiazide.  We will confirm that she is taking her potassium chloride once a day, and if so we will have her increase that to twice daily.

## 2022-01-17 NOTE — Telephone Encounter (Signed)
No answer left detailed message on patients phone, and instructed to call with any questions.

## 2022-01-17 NOTE — Assessment & Plan Note (Addendum)
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  She was treated with left lower lobectomy in [place].  Her doctor there recommended chemotherapy with immunotherapy, but she refused chemotherapy.  PD-L1 was positive.  She was only willing to take the immunotherapy for 1 year and had a port placed.  She started pembrolizumab in September, then relocated to [place].  We saw her to establish care in October and have continued pembrolizumab every 21 days. We obtained a CT chest in October, which revealed new indistinct clustered lingular pulmonary nodules the largest measuring 6 mm, which were indeterminate.  Follow-up CT chest in 3 months was recommended.  She reported orthopnea with chest discomfort with a history of coronary artery disease and has seen Dr. Geraldo Pitter.  She is scheduled for an echocardiogram and stress test on January 3.  She reports persistent shortness of breath with chronic cough productive of clear sputum.  Chest x-ray today is unremarkable.  She has had increased nausea and vomiting.  I will add prochlorperazine as needed.  She did not want to increase her omeprazole to twice daily.  She will proceed with a 6th cycle of pembrolizumab next week.  We will plan to see her back in 3 weeks prior to a 7th cycle.

## 2022-01-18 ENCOUNTER — Other Ambulatory Visit: Payer: Self-pay

## 2022-01-18 LAB — T4: T4, Total: <0.4 ug/dL — ABNORMAL LOW (ref 4.5–12.0)

## 2022-01-18 MED FILL — Pembrolizumab IV Soln 100 MG/4ML (25 MG/ML): INTRAVENOUS | Qty: 8 | Status: AC

## 2022-01-22 ENCOUNTER — Ambulatory Visit: Payer: Medicare HMO

## 2022-01-22 ENCOUNTER — Other Ambulatory Visit: Payer: Self-pay | Admitting: Hematology and Oncology

## 2022-01-22 ENCOUNTER — Inpatient Hospital Stay: Payer: Medicare HMO

## 2022-01-22 VITALS — BP 130/77 | HR 78 | Temp 98.4°F | Resp 18 | Wt 135.1 lb

## 2022-01-22 DIAGNOSIS — E032 Hypothyroidism due to medicaments and other exogenous substances: Secondary | ICD-10-CM | POA: Insufficient documentation

## 2022-01-22 DIAGNOSIS — Z5112 Encounter for antineoplastic immunotherapy: Secondary | ICD-10-CM | POA: Diagnosis not present

## 2022-01-22 DIAGNOSIS — C3492 Malignant neoplasm of unspecified part of left bronchus or lung: Secondary | ICD-10-CM

## 2022-01-22 HISTORY — DX: Hypothyroidism due to medicaments and other exogenous substances: E03.2

## 2022-01-22 MED ORDER — SODIUM CHLORIDE 0.9 % IV SOLN: Freq: Once | INTRAVENOUS | Status: AC

## 2022-01-22 MED ORDER — SODIUM CHLORIDE 0.9 % IV SOLN
200.0000 mg | Freq: Once | INTRAVENOUS | Status: AC
  Administered 2022-01-22: 200 mg via INTRAVENOUS
  Filled 2022-01-22: qty 8

## 2022-01-22 MED ORDER — SODIUM CHLORIDE 0.9% FLUSH
10.0000 mL | INTRAVENOUS | Status: DC | PRN
  Administered 2022-01-22: 10 mL

## 2022-01-22 MED ORDER — HEPARIN SOD (PORK) LOCK FLUSH 100 UNIT/ML IV SOLN
500.0000 [IU] | Freq: Once | INTRAVENOUS | Status: AC | PRN
  Administered 2022-01-22: 500 [IU]

## 2022-01-22 NOTE — Patient Instructions (Signed)

## 2022-01-23 IMAGING — CT CT CHEST SUPER D W/O CM
3 of 4 series · 16 of 36 positions shown, 18 images · non-contrast
Comparison: PET 11/01/2020 and CT chest 10/13/2020.

CLINICAL DATA: Lung cancer, biopsy on [REDACTED].

EXAM:
CT CHEST WITHOUT CONTRAST
TECHNIQUE: Multidetector CT imaging of the chest was performed using thin slice
collimation for electromagnetic bronchoscopy planning purposes,
without intravenous contrast.

[Series 2: thorax · axial · 0.67mm/px · z∈[-275,-40]mm · 7 of 63 slices shown, 9 images]
[im 8/63  mediastinal]
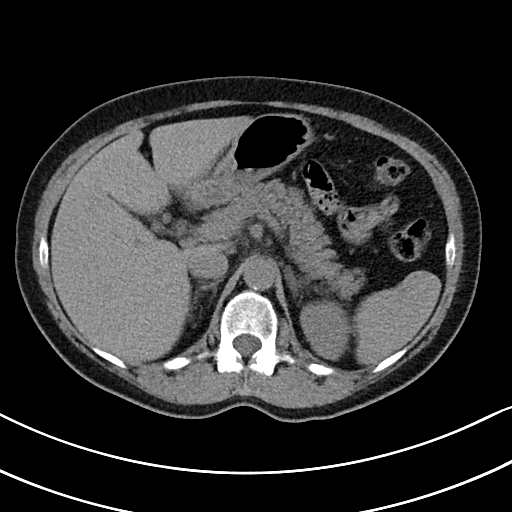
[im 8/63  lung]
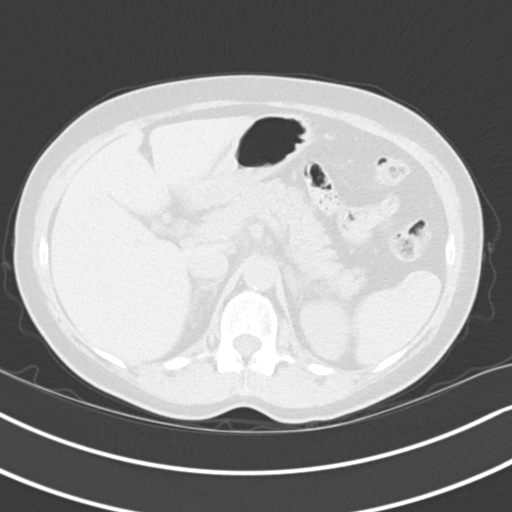
[im 16/63  lung]
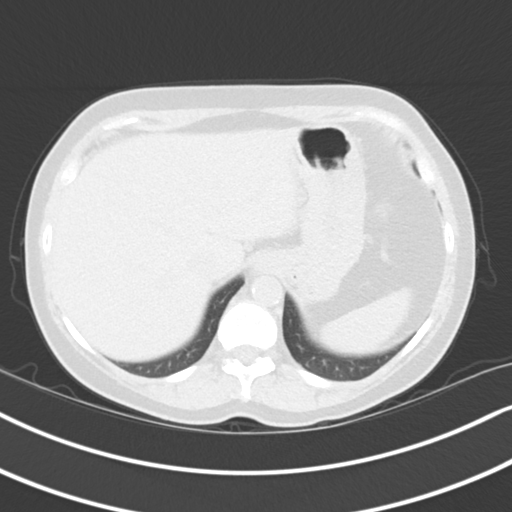
[im 24/63  lung]
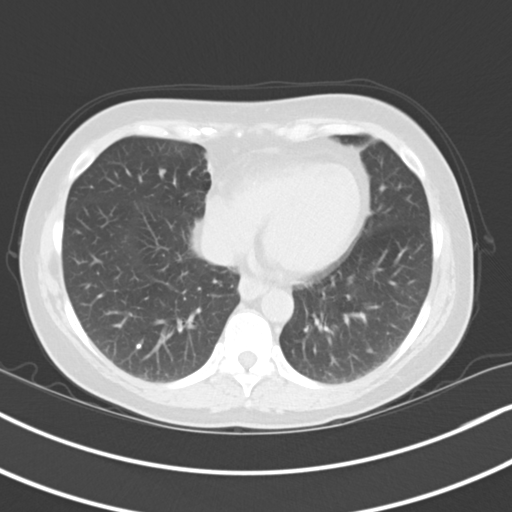
[im 32/63  lung]
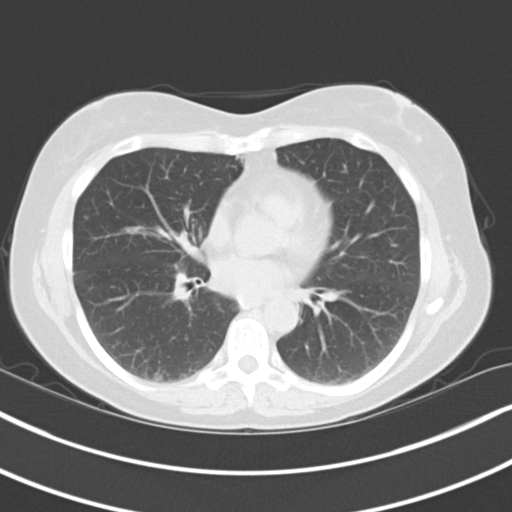
[im 39/63  mediastinal]
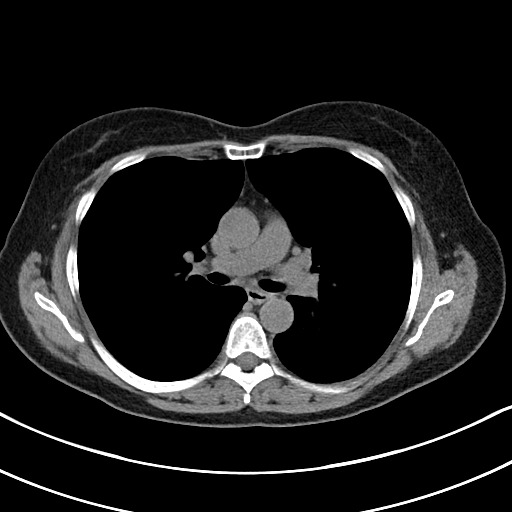
[im 39/63  lung]
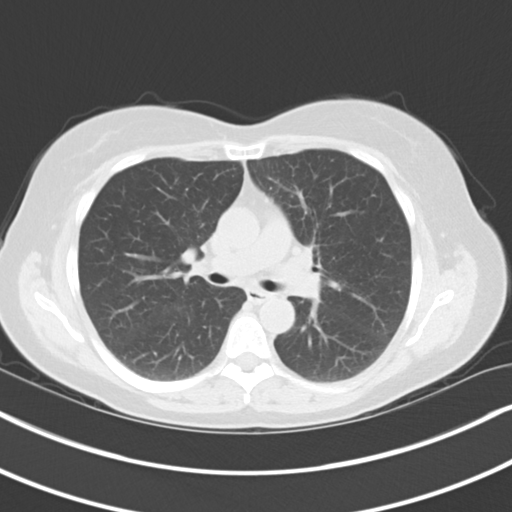
[im 47/63  lung]
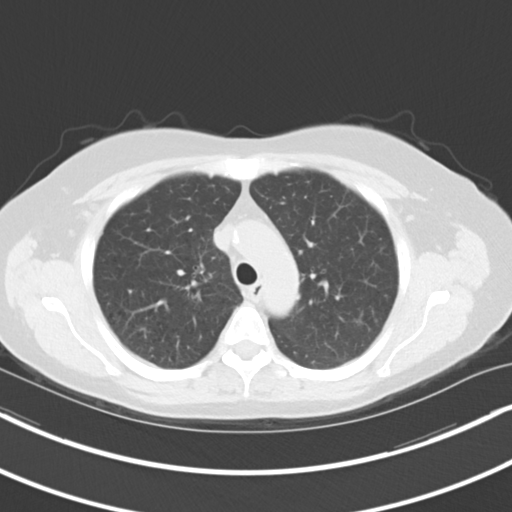
[im 55/63  lung]
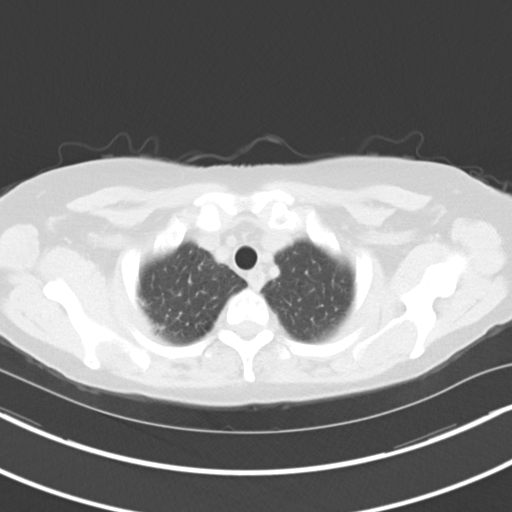

[Series 5: coronal · coronal · 0.62mm/px · 3 of 128 slices shown]
[im 26/128  lung]
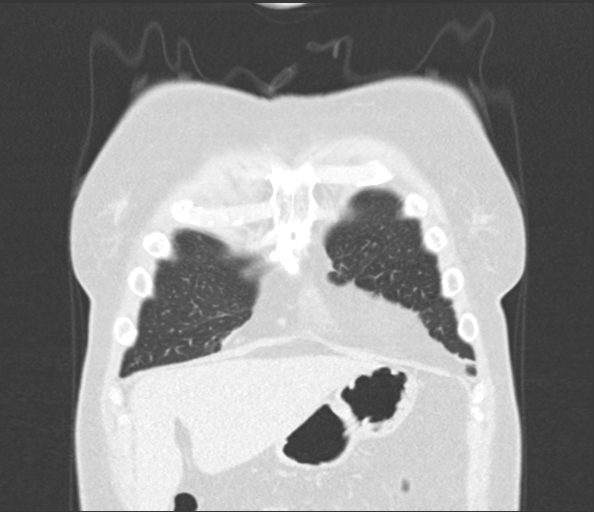
[im 51/128  lung]
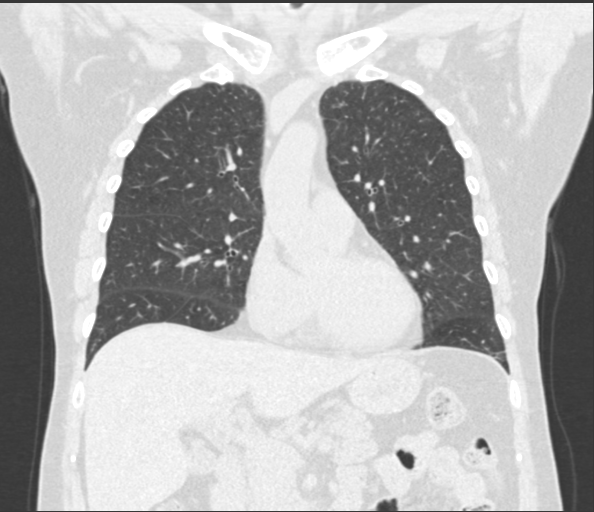
[im 77/128  lung]
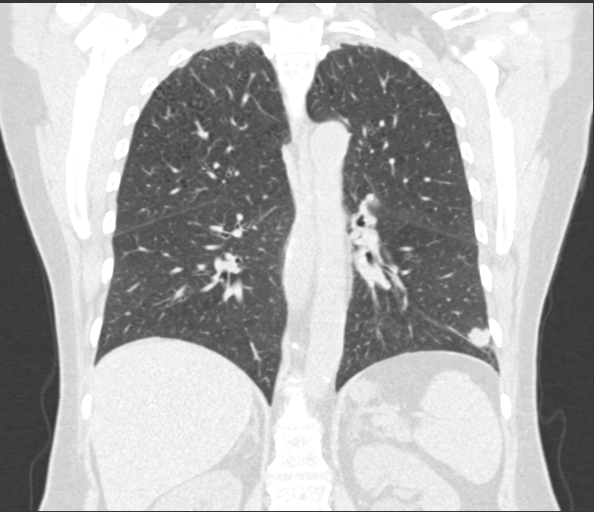

[Series 7: lungs · axial · 0.67mm/px · z∈[-254,-96]mm · 6 of 143 slices shown]
[im 16/143  lung]
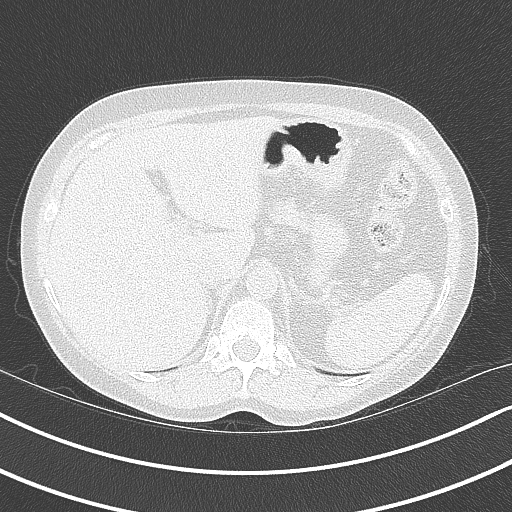
[im 32/143  lung]
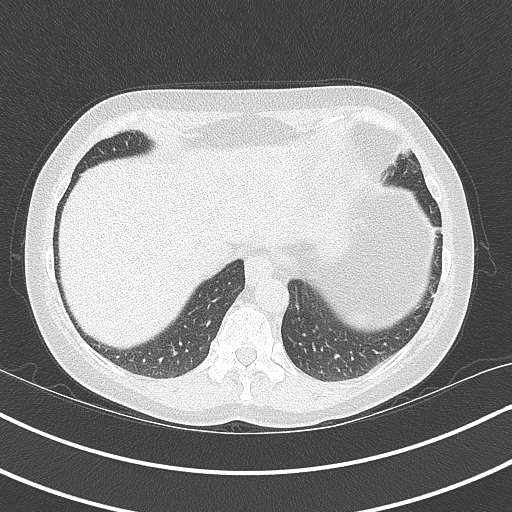
[im 48/143  lung]
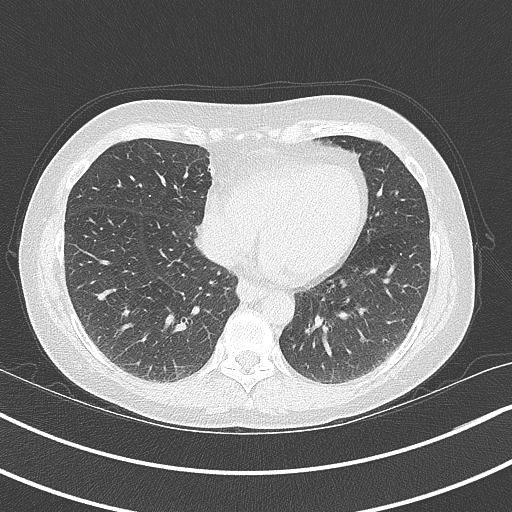
[im 64/143  lung]
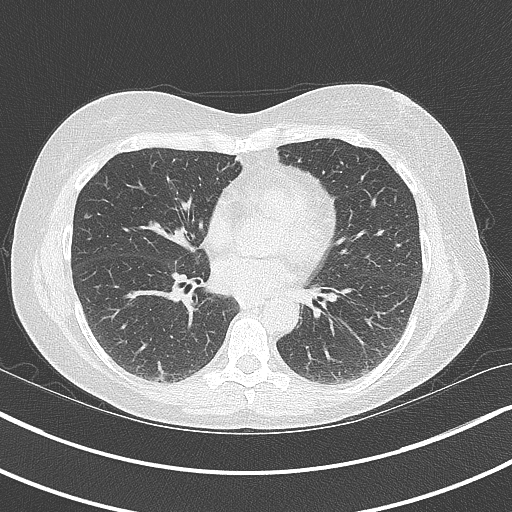
[im 79/143  lung]
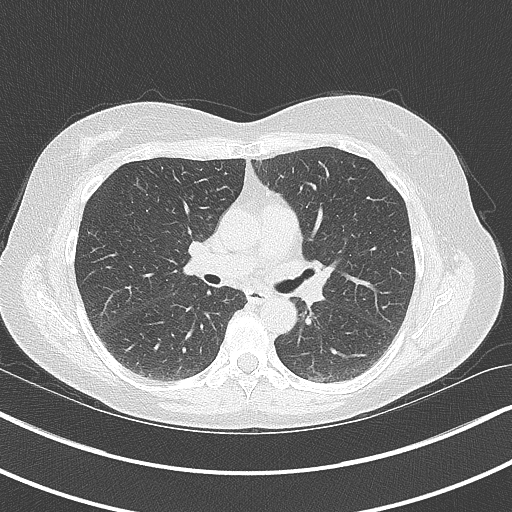
[im 95/143  lung]
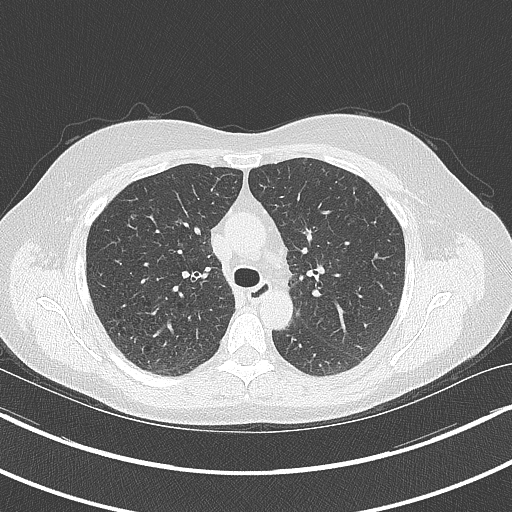

[16 of 36 positions shown; findings below may reference images not displayed]

FINDINGS: Cardiovascular: Atherosclerotic calcification of the aorta. Heart
size normal. No pericardial effusion.

Mediastinum/Nodes: No pathologically enlarged mediastinal or
axillary lymph nodes. Hilar regions are difficult to evaluate
without IV contrast but appear grossly unremarkable. There may be
distal esophageal wall thickening which can be seen with
gastroesophageal reflux.

Lungs/Pleura: Biapical pleuroparenchymal scarring. Centrilobular and
paraseptal emphysema. Nodules in the right middle lobe measure up to
5 mm in the medial segment (7/74), as on 10/13/2020. 3 mm peripheral
left upper lobe nodule (7/54), also unchanged. Posterolateral left
lower lobe nodule has enlarged, now measuring 1.4 x 1.9 cm (7/104),
previously 0.9 x 1.2 cm on 10/13/2020. No pleural fluid. Airway is
unremarkable.

Upper Abdomen: Visualized portions of the liver, gallbladder,
adrenal glands, kidneys, spleen, pancreas, stomach and bowel are
grossly unremarkable.

Musculoskeletal: No worrisome lytic or sclerotic lesions.
IMPRESSION: 1. Enlarging left lower lobe nodule, most indicative of primary
bronchogenic carcinoma.
2. Additional smaller pulmonary nodules are unchanged from
10/13/2020.
3.  Aortic atherosclerosis ([id number]-[id number]).
4.  Emphysema ([id number].M).

## 2022-01-24 ENCOUNTER — Encounter: Payer: Self-pay | Admitting: Hematology and Oncology

## 2022-01-29 IMAGING — DX DG CHEST 1V PORT
1 series · 1 of 1 positions shown · non-contrast
Comparison: 01/09/2021

CLINICAL DATA: Follow-up bronchoscopy and biopsy on the left

EXAM:
PORTABLE CHEST 1 VIEW

[chest]
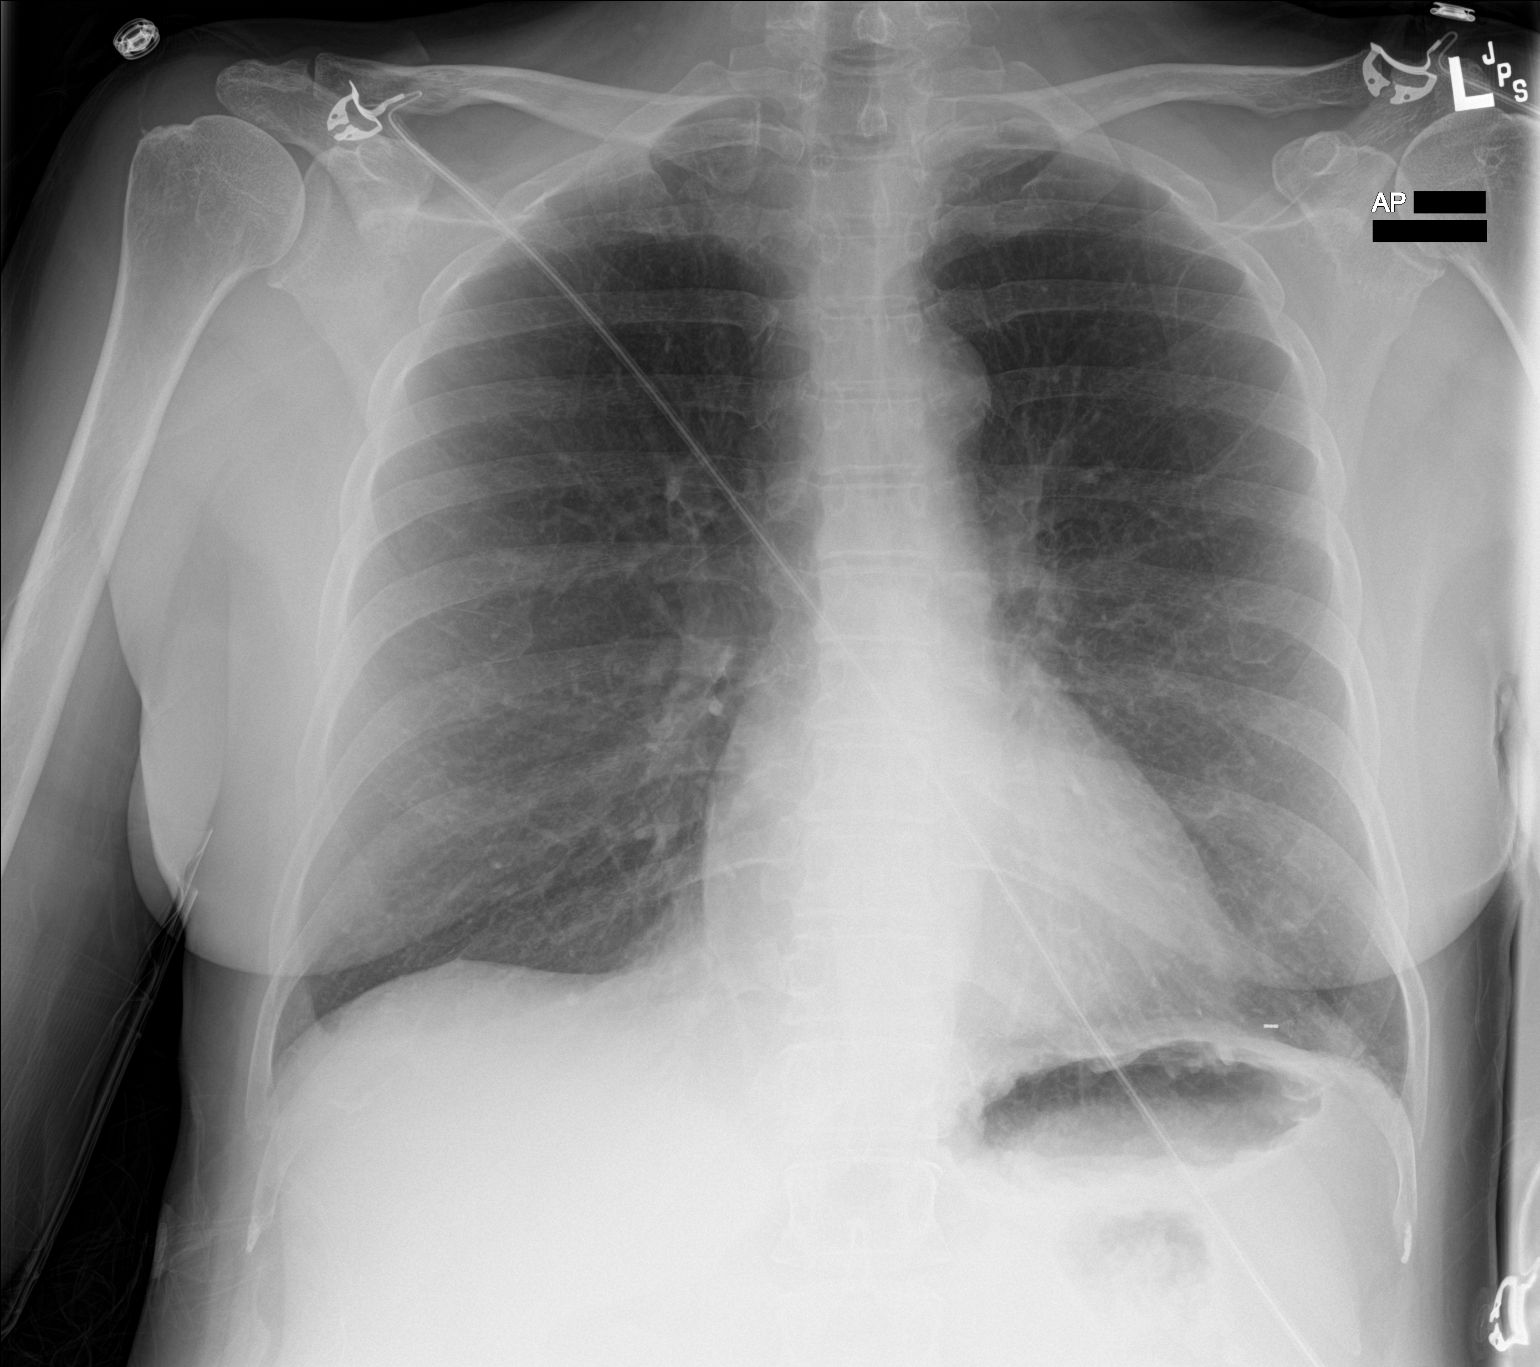

[1 of 1 positions shown; findings below may reference images not displayed]

FINDINGS: Heart and mediastinal shadows are normal. The right lung is clear.
Lower left lateral pulmonary density is visible but without evidence
of post bronchoscopy complication. No pneumothorax or hemothorax.
IMPRESSION: No evidence of complication following bronchoscopy and biopsy. Mild
density visible in the lower left lateral lung.

## 2022-01-30 ENCOUNTER — Ambulatory Visit (INDEPENDENT_AMBULATORY_CARE_PROVIDER_SITE_OTHER): Payer: Medicare HMO

## 2022-01-30 ENCOUNTER — Ambulatory Visit: Payer: Medicare HMO | Attending: Cardiology

## 2022-01-30 DIAGNOSIS — R0609 Other forms of dyspnea: Secondary | ICD-10-CM | POA: Diagnosis not present

## 2022-01-30 LAB — ECHOCARDIOGRAM COMPLETE
Area-P 1/2: 3.31 cm2
Height: 59 in
S' Lateral: 2.7 cm
Weight: 2208 oz

## 2022-01-30 MED ORDER — REGADENOSON 0.4 MG/5ML IV SOLN
0.4000 mg | Freq: Once | INTRAVENOUS | Status: AC
  Administered 2022-01-30: 0.4 mg via INTRAVENOUS

## 2022-01-30 MED ORDER — TECHNETIUM TC 99M TETROFOSMIN IV KIT
30.3000 | PACK | Freq: Once | INTRAVENOUS | Status: AC | PRN
  Administered 2022-01-30: 30.3 via INTRAVENOUS

## 2022-01-30 MED ORDER — TECHNETIUM TC 99M TETROFOSMIN IV KIT
10.4000 | PACK | Freq: Once | INTRAVENOUS | Status: AC | PRN
  Administered 2022-01-30: 10.4 via INTRAVENOUS

## 2022-01-31 ENCOUNTER — Telehealth: Payer: Self-pay | Admitting: Cardiology

## 2022-01-31 LAB — MYOCARDIAL PERFUSION IMAGING
LV dias vol: 48 mL (ref 46–106)
LV sys vol: 13 mL
Nuc Stress EF: 74 %
Peak HR: 74 {beats}/min
Rest HR: 62 {beats}/min
Rest Nuclear Isotope Dose: 10.4 mCi
SDS: 1
SRS: 2
SSS: 3
Stress Nuclear Isotope Dose: 30.3 mCi
TID: 0.86

## 2022-01-31 NOTE — Telephone Encounter (Signed)
Results reviewed with pt as per Dr. Revankar's note.  Pt verbalized understanding and had no additional questions. Routed to PCP.  

## 2022-01-31 NOTE — Telephone Encounter (Signed)
Pt returning nurses call regarding results. Please advise 

## 2022-02-01 ENCOUNTER — Encounter: Payer: Self-pay | Admitting: Oncology

## 2022-02-05 NOTE — Progress Notes (Signed)
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 This was initially found on lung cancer screening CT in September 2022. Most recent CT imaging revealed the solitary left lower lobe nodule to be enlarging, measuring 1.2 cm in September and now measuring 1.4 x 1.9 cm.  She initially met with Dr. Cliffton Asters for consultation, and he recommended SBRT as he felt that her smoking would cause excessive risk for postoperative complications.  She sought another opinion with  Dr. Maryan Rued in [place] who agreed to her surgical management. Her daughter lives in [place] and will be her support system.  She has now had her surgery on April 12 with left thoracotomy and left lower lobe lobectomy along with mediastinal lymph node dissection.  Pathology revealed an adenocarcinoma grade 2 measuring 2.5 cm in diameter but with no evidence of visceral pleural invasion or direct invasion of adjacent structures.  All margins are negative for invasive carcinoma.  She did have involvement of 2 out of 20 lymph nodes for a T1c N1 M0, stage IIB.  EGFR and ALK mutations were negative but PD-L1 was 90%.  Her doctor there recommended chemotherapy with immunotherapy but she refused chemotherapy.  She was only willing to take the immunotherapy for 1 year and had a port placed.  She had her first dose on September 7 and her second dose on September 28.  She has now relocated to [facility] and we have continued this regimen.    Tobacco abuse.  She has not smoked at all since April 12 when she had her surgery.  I praised her for that.  3.     Dissociative identity disorder with 3 personalities. She continues to follow with a psychiatrist.   4.     Hypokalemia.  Potassium was down to 2.8 last visit and so she  is on supplement now and we will recheck it next week.  I will place her on 20 mEq to take once daily and have given her a list of potassium containing foods to include in her diet.  Plan: I have recommended magic mouthwash along with diflucan 100 mg, 1 pill a day for 10 days to relieve the thrush. We will plan to schedule a CT scan at the end of January to discuss results prior to her 8th cycle of Pembrolizumab. She is reluctant to continue her therapy for a full year, but I will keep encouraging her to do so. Her daughter seems to understand. We will continue with scans every 3 months.  Later we will need to arrange for annual mammography and colonoscopy when she is due since she does have a history of colon polyps she understands and agrees with this plan of care. I have answered their questions and know to call with any concerns.   ADDENDUM: Her potassium is dangerously low at 2.7, so we will contact her to come in tomorrow for IV potassium 40 Meq. When she comes for her infusion on Monday, I will plan to repeat another 20 Meq IV along with her infusion. We will also increase her oral supplement, but I don't think she is being compliant.    I provided 35 minutes of face-to-face time during this this encounter and > 50% was spent counseling as documented under  my assessment and plan.    Derwood Kaplan, MD [facility] [address] [place] [address] Dept: [phone] Dept Fax: [phone]    CHIEF COMPLAINT:  CC: Adenocarcinoma of the left lung  Current Treatment:  Referral for 2nd opinion on surgical resection   HISTORY OF PRESENT ILLNESS:  Sydney Hunt is a 62 y.o. female with a history of tobacco abuse who has adenocarcinoma of the left lung. This was originally found on lung cancer screening CT from September 2022. PET imaging was obtained in October which revealed a solitary 1.25 cm left lower lobe pulmonary  nodule to be hypermetabolic and consistent with small primary lung neoplasm. No findings for mediastinal or hilar adenopathy or metastatic disease elsewhere was observed. She met with the genetic counselor and [facility] diagnostic testing was negative for any clinically significant mutations. CT chest from December revealed enlarging left lower lobe nodule, measuring 1.4 x 1.9 cm. The additional smaller pulmonary nodules remained unchanged. She was referred to Dr. Baltazar Apo and underwent bronchoscopy on December 19th and cytology from this procedure confirmed malignant cells consistent with adenocarcioma. Immunohistochemical stains were positive for TTF-1, and negative for p63 and CK5/6. 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 Her FEV1 was 1.98, which is 88% of predicted and her FVC was 2.58, which is 89% of predicted. She was deemed a candidate for resection and met with Dr. Kipp Brood for consultation. As she is not motivated to quit smoking, he felt that surgery would present an excessive risk for postoperative respiratory complications. He recommended SBRT as a good option if the patient was unable to quit smoking.  The patient went for a second opinion to Dr. Asencion Gowda and surgical resection was carried out on April 12 with a left lower lobe lobectomy.  She was hospitalized for 13 days and has not smoked since that time.  Pathology revealed a 2.5 cm adenocarcinoma, grade 2.  There was no evidence of pleural invasion and margins are clear but she tells me 2 of 20 nodes were positive, for a T1c N1 M0, stage IIB.  A port was placed on August 16 and she was started on pembrolizumab on September 7 and had her second dose on September 28.  INTERVAL HISTORY:  I have reviewed her chart and materials related to her cancer extensively and collaborated history with the patient. Summary of oncologic history is as follows: Oncology History  Non-small cell lung cancer, left ([facility])   01/15/2021 Initial Diagnosis   Non-small cell lung cancer, left ([place])   02/12/2021 Cancer Staging   Staging form: Lung, AJCC 8th Edition - Clinical stage from 02/12/2021: Stage IA2 (cT1b, cN0, cM0) - Signed by Derwood Kaplan, MD on 02/14/2021 Histopathologic type: Adenocarcinoma, NOS Stage prefix: Initial diagnosis Histologic grade (G): GX Histologic grading system: 4 grade system Laterality: Left Tumor size (mm): 19 Lymph-vascular invasion (LVI): LVI not present (absent)/not identified Diagnostic confirmation: Positive histology Specimen type: Bronchial Biopsy Staged by: Managing physician Stage used in treatment planning: Yes National guidelines used in treatment planning: Yes Type of national guideline used in treatment planning: NCCN Staging comments: Rec surgical resection   05/11/2021 Cancer Staging   Staging form: Lung, AJCC 8th Edition - Pathologic stage from 05/11/2021: Stage IIB (ypT1c, pN1, cM0) - Signed by Derwood Kaplan, MD on 11/12/2021 Histopathologic type: Adenocarcinoma, NOS Stage prefix: Post-therapy Histologic grade (G): G2 Histologic grading system: 4 grade system Residual tumor (R): R0 -  None Laterality: Left Tumor size (mm): 25 Lymph-vascular invasion (LVI): LVI not present (absent)/not identified Diagnostic confirmation: Positive histology PLUS positive immunophenotyping and/or positive genetic studies Specimen type: Excision Staged by: Managing physician Type of lung cancer: Surgically resected non-small cell lung cancer ECOG performance status: Grade 1 Perineural invasion (PNI): Absent Pleural/elastic layer invasion: PL0 Pleural lavage cytology: Unknown Weight loss: Absent Adequacy of mediastinal dissection: Adequate Radiotherapy dose: No Adjuvant radiation: No Adjuvant chemotherapy: Yes Stage used in treatment planning: Yes National guidelines used in treatment planning: Yes Type of national guideline used in treatment planning:  NCCN Staging comments: Adjuvant pembrolizumab, PDL 1 90%   10/04/2021 -  Chemotherapy   Patient is on Treatment Plan : LUNG NSCLC Pembrolizumab Adj q21d x 18 cycles     Interval History  Celisse is here today for a routine follow up prior to her 7th cycle of Pembrolizumab. I have recommended magic mouthwash along with diflucan 100 mg, 1 pill a day for 10 days to relieve the thrush. She has a cough and notes she has coughed up white phlegm. She also notes rhinorrhea/stuffed up. She also notes that it is slightly hard to breathe in, but only notes it every once in a while. Her TSH from 01/27/22 was 221, from 0 the visit before, we are awaiting today's labs. I have let her know that it may be recommended to take thyroid medication. She is taking 50 mcg of levothyroxine already but we will continue to monitor and see if there needs to be any changes. She denies fever, chills or other signs of infection.  She denies vomiting or abdominal pain.  She denies sore throat, or chest pain.  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 Report date is 12/12/2020.     The Multi-Cancer + RNA Panel offered by [facility] includes sequencing and/or deletion/duplication analysis of the following 84 genes:  AIP*, ALK, APC*, ATM*, AXIN2*, BAP1*, BARD1*, BLM*, BMPR1A*, BRCA1*, BRCA2*, BRIP1*, CASR, CDC73*, CDH1*, CDK4, CDKN1B*, CDKN1C*, CDKN2A, CEBPA, CHEK2*, CTNNA1*, DICER1*, DIS3L2*, EGFR, EPCAM, FH   GERD (gastroesophageal reflux disease)    Heartburn 12/27/2021   Hormone disorder 11/20/2020   Hypertension    Hypokalemia 12/27/2021   Leukocytosis  01/17/2022   Migraines    Nodule of left lung 11/21/2020   Non-small cell lung cancer, left (HCC) 01/15/2021   Tobacco use 12/07/2020    Past Surgical History:  Procedure Laterality Date   ABDOMINAL HYSTERECTOMY  1989   unsure of vaginal or abdominal   BRONCHIAL BIOPSY  01/15/2021   Procedure: BRONCHIAL BIOPSIES;  Surgeon: Leslye Peer, MD;  Location: [facility] ENDOSCOPY;  Service: Pulmonary;;   BRONCHIAL BRUSHINGS  01/15/2021   Procedure: BRONCHIAL BRUSHINGS;  Surgeon: Leslye Peer, MD;  Location[facility];  Service: Pulmonary;;   BRONCHIAL NEEDLE ASPIRATION BIOPSY  01/15/2021   Procedure: BRONCHIAL NEEDLE ASPIRATION BIOPSIES;  Surgeon: Leslye Peer, MD;  Location: [facility] ENDOSCOPY;  Service: Pulmonary;;   COLONOSCOPY  2022   FIDUCIAL MARKER PLACEMENT  01/15/2021   Procedure: FIDUCIAL MARKER PLACEMENT;  Surgeon: Leslye Peer, MD;  Location: [facility] ENDOSCOPY;  Service: Pulmonary;;   ovaries removed  1986   surgery to remove scar tissue     from colon/bladder in patient's late 30's   TUBAL LIGATION  1986   UPPER GI ENDOSCOPY     years ago in patient's late 9s   VIDEO BRONCHOSCOPY WITH RADIAL ENDOBRONCHIAL ULTRASOUND  01/15/2021   Procedure: VIDEO BRONCHOSCOPY WITH RADIAL ENDOBRONCHIAL ULTRASOUND;  Surgeon: Leslye Peer, MD;  Location: [facility] ENDOSCOPY;  Service: Pulmonary;;    Family History  Problem Relation Age of Onset   Pancreatic cancer Maternal Aunt 49   Brain cancer Maternal Uncle    Stomach cancer Maternal Uncle        dx. >50   Prostate cancer Half-Brother        passed away at 41   Prostate cancer Half-Brother     Social History:  reports that she quit smoking about 10 months ago. Her smoking use included cigarettes. She started smoking about 61 years ago. She has a 29.50 pack-year smoking history. She has never used smokeless tobacco. She reports current alcohol use of about 4.0 standard drinks of alcohol per week. She reports current drug use. Frequency: 3.00  times per week. Drug: Marijuana. She is widowed and lives at home.  She has quit smoking since April 12, when she had her surgical resection.  She has 2 biological children and 1 stepchild. She is disabled, and has been exposed to chemicals or other toxic agents. She was previously employed at a hosiery, chicken farm and cleaning houses. 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  Allergies:  Allergies  Allergen Reactions   Codeine Rash   Demerol [Meperidine Hcl] Other (See Comments)    coma   Other Dermatitis    Stainless steel / skin weeps   Silver Dermatitis    Sterling silver skin weeps   Gold Bond [Menthol-Zinc Oxide] Dermatitis    Skin weeps   Neosporin [Bacitracin-Polymyxin B] Dermatitis    Skin weeps   Wound Dressing Adhesive Other (See Comments)    CAUSES SKIN TEARS PER PATIENT. INSTEAD USE THE TEGADERM 50M WITH ADHESIVE FREE WINDOW OVER PORT.    Current Medications: Current Outpatient Medications  Medication Sig Dispense Refill   albuterol (VENTOLIN HFA) 108 (90 Base) MCG/ACT inhaler Inhale 2 puffs into the lungs every 4 (four) hours as needed for wheezing or shortness of breath.     amitriptyline (ELAVIL) 10 MG tablet Take 10 mg by mouth at bedtime as needed for sleep.     Ascorbic Acid (VITAMIN C WITH ROSE HIPS) 500 MG tablet Take 500 mg by mouth daily.     aspirin 81 MG EC tablet Take 81 mg by mouth daily.     atorvastatin (LIPITOR) 40 MG tablet Take 40 mg by mouth daily.     b complex vitamins capsule Take 1 capsule by mouth daily. (Patient not taking: Reported on 01/17/2022)     Biotin 5000 MCG CAPS Take 5,000 mcg by mouth daily.     Calcium-Vitamin D-Vitamin K (CHEWABLE CALCIUM PO) Take 650 mg by mouth daily.     celecoxib (CELEBREX) 200 MG capsule Take 200 mg by mouth daily.     fluconazole (DIFLUCAN) 100 MG tablet Take 1 tablet (100 mg total) by mouth daily. 10 tablet 0   gabapentin (NEURONTIN) 300 MG capsule Take 300 mg by mouth at  bedtime.     hydrochlorothiazide (HYDRODIURIL) 25 MG tablet Take 25 mg by mouth every morning.  HYDROcodone-acetaminophen (NORCO/VICODIN) 5-325 MG tablet Take 1 tablet by mouth every 6 (six) hours as needed for moderate pain or severe pain. 30 tablet 0   levothyroxine (SYNTHROID) 50 MCG tablet Take 1 tablet (50 mcg total) by mouth daily before breakfast. 30 tablet 2   lidocaine-prilocaine (EMLA) cream Apply 1 Application topically as needed. 30 g 0   omeprazole (PRILOSEC) 40 MG capsule Take 1 capsule (40 mg total) by mouth daily. 30 capsule 2   ondansetron (ZOFRAN) 4 MG tablet Take 1 tablet (4 mg total) by mouth every 4 (four) hours as needed for nausea. 90 tablet 3   potassium chloride SA (KLOR-CON M) 20 MEQ tablet Take 20 mEq by mouth daily.     prochlorperazine (COMPAZINE) 10 MG tablet Take 1 tablet (10 mg total) by mouth every 6 (six) hours as needed for nausea or vomiting. 30 tablet 2   vitamin E 180 MG (400 UNITS) capsule Take 400 Units by mouth daily. (Patient not taking: Reported on 01/17/2022)     No current facility-administered medications for this visit.    REVIEW OF SYSTEMS:  Review of Systems  Constitutional: Negative.  Negative for appetite change, chills, fatigue, fever and unexpected weight change.  HENT:  Negative.    Eyes: Negative.   Respiratory:  Negative for chest tightness, cough (occasionally productive with clear sputum), hemoptysis and shortness of breath. Wheezing: occasional.  Cardiovascular: Negative.  Negative for chest pain, leg swelling and palpitations.  Gastrointestinal: Negative.  Negative for abdominal distention, abdominal pain, blood in stool, constipation, diarrhea, nausea and vomiting.  Endocrine: Negative.   Genitourinary: Negative.  Negative for difficulty urinating, dysuria, frequency and hematuria.   Musculoskeletal: Negative.  Negative for arthralgias, back pain, flank pain, gait problem and myalgias.  Skin: Negative.   Neurological: Negative.   Negative for dizziness, extremity weakness, gait problem, headaches, light-headedness, numbness, seizures and speech difficulty.  Hematological: Negative.   Psychiatric/Behavioral: Negative.  Negative for depression and sleep disturbance. The patient is not nervous/anxious.        Dissociative identity disorder      VITALS:  Blood pressure (!) 147/94, pulse 66, temperature 98.5 F (36.9 C), temperature source Oral, resp. rate 18, height 4\' 11"  (1.499 m), weight 137 lb 3.2 oz (62.2 kg), SpO2 100 %.  Wt Readings from Last 3 Encounters:  02/28/22 132 lb 4.8 oz (60 kg)  02/07/22 137 lb 3.2 oz (62.2 kg)  01/30/22 138 lb (62.6 kg)    Body mass index is 27.71 kg/m.  Performance status (ECOG): 1 - Symptomatic but completely ambulatory  PHYSICAL EXAM:  Physical Exam Constitutional:      General: She is not in acute distress.    Appearance: Normal appearance. She is normal weight.  HENT:     Head: Normocephalic and atraumatic.  Eyes:     General: No scleral icterus.    Extraocular Movements: Extraocular movements intact.     Conjunctiva/sclera: Conjunctivae normal.     Pupils: Pupils are equal, round, and reactive to light.  Cardiovascular:     Rate and Rhythm: Normal rate and regular rhythm.     Pulses: Normal pulses.     Heart sounds: Normal heart sounds. No murmur heard.    No friction rub. No gallop.  Pulmonary:     Effort: Pulmonary effort is normal. No respiratory distress.     Breath sounds: Normal breath sounds.  Abdominal:     General: Bowel sounds are normal. There is no distension.     Palpations:  Abdomen is soft. There is no hepatomegaly, splenomegaly or mass.     Tenderness: There is no abdominal tenderness.  Musculoskeletal:        General: Normal range of motion.     Cervical back: Normal range of motion and neck supple.     Right lower leg: No edema.     Left lower leg: No edema.  Lymphadenopathy:     Cervical: No cervical adenopathy.  Skin:    General: Skin  is warm and dry.  Neurological:     General: No focal deficit present.     Mental Status: She is alert and oriented to person, place, and time. Mental status is at baseline.  Psychiatric:        Mood and Affect: Mood normal.        Behavior: Behavior normal.        Thought Content: Thought content normal.        Judgment: Judgment normal.      LABS:      Latest Ref Rng & Units 02/28/2022   12:00 AM 02/07/2022   11:29 AM 01/17/2022   12:00 AM  CBC  WBC  10.6     8.2  11.5      Hemoglobin 12.0 - 16.0 12.5     13.0  13.6      Hematocrit 36 - 46 36     38.6  40      Platelets 150 - 400 K/uL 307     267  318         This result is from an external source.      Latest Ref Rng & Units 02/07/2022   11:29 AM 01/17/2022    9:41 AM 01/14/2022   12:00 AM  CMP  Glucose 70 - 99 mg/dL 94  74    BUN 8 - 23 mg/dL 8  12  11       Creatinine 0.44 - 1.00 mg/dL 1.21  1.26  1.4      Sodium 135 - 145 mmol/L 136  141  137      Potassium 3.5 - 5.1 mmol/L 2.7  3.2  3.6      Chloride 98 - 111 mmol/L 96  103  96      CO2 22 - 32 mmol/L 30  29  26       Calcium 8.9 - 10.3 mg/dL 9.3  10.0  9.9      Total Protein 6.5 - 8.1 g/dL 7.8  8.1    Total Bilirubin 0.3 - 1.2 mg/dL 1.1  0.9    Alkaline Phos 38 - 126 U/L 56  56  64      AST 15 - 41 U/L 62  35  33      ALT 0 - 44 U/L 25  19  15          This result is from an external source.     Lab Results  Component Value Date   CEA1 2.8 11/08/2021   /  CEA  Date Value Ref Range Status  11/08/2021 2.8 0.0 - 4.7 ng/mL Final    Comment:    (NOTE)                             Nonsmokers          <3.9  Smokers             <5.6 [employer] Electrochemiluminescence Immunoassay (ECLIA) Values obtained with different assay methods or kits cannot be used interchangeably.  Results cannot be interpreted as absolute evidence of the presence or absence of malignant disease. Performed At: [facility] [address]  [place], [place] [id number] Jolene Schimke MD CM:[id number]      STUDIES:  MYOCARDIAL PERFUSION IMAGING  Result Date: 01/31/2022   The study is normal. The study is low risk.   Left ventricular function is normal. Nuclear stress EF: 74 %. The left ventricular ejection fraction is hyperdynamic (>65%). End diastolic cavity size is normal.   ECHOCARDIOGRAM COMPLETE  Result Date: 01/30/2022    ECHOCARDIOGRAM REPORT   Patient Name:   BURNETTA KOHLS Date of Exam: 01/30/2022 Medical Rec #:  [id number]   Height:       61.0 in Accession #:    [id number]  Weight:       135.1 lb Date of Birth:  Mar 22, 1960   BSA:          1.599 m Patient Age:    61 years    BP:           130/77 mmHg Patient Gender: F           HR:           62 bpm. Exam Location:  [place] Procedure: 2D Echo, Cardiac Doppler, Color Doppler and Strain Analysis Indications:    DOE (dyspnea on exertion) [R06.09 (ICD-10-CM)]  History:        Patient has no prior history of Echocardiogram examinations.                 Signs/Symptoms:Dyspnea; Risk Factors:Hypertension.  Sonographer:    Margreta Journey RDCS Referring Phys: Rito Ehrlich [facility] IMPRESSIONS  1. GLS -14.5%. Left ventricular ejection fraction, by estimation, is 60 to 65%. The left ventricle has normal function. The left ventricle has no regional wall motion abnormalities. Left ventricular diastolic parameters were normal.  2. Right ventricular systolic function is normal. The right ventricular size is normal.  3. The mitral valve is normal in structure. No evidence of mitral valve regurgitation. No evidence of mitral stenosis.  4. The aortic valve is normal in structure. Aortic valve regurgitation is not visualized. No aortic stenosis is present.  5. The inferior vena cava is normal in size with greater than 50% respiratory variability, suggesting right atrial pressure of 3 mmHg. FINDINGS  Left Ventricle: GLS -14.5%. Left ventricular ejection fraction, by estimation, is 60 to 65%. The left ventricle  has normal function. The left ventricle has no regional wall motion abnormalities. The left ventricular internal cavity size was normal in size. There is no left ventricular hypertrophy. Left ventricular diastolic parameters were normal. Right Ventricle: The right ventricular size is normal. No increase in right ventricular wall thickness. Right ventricular systolic function is normal. Left Atrium: Left atrial size was normal in size. Right Atrium: Right atrial size was normal in size. Pericardium: There is no evidence of pericardial effusion. Mitral Valve: The mitral valve is normal in structure. No evidence of mitral valve regurgitation. No evidence of mitral valve stenosis. Tricuspid Valve: The tricuspid valve is normal in structure. Tricuspid valve regurgitation is not demonstrated. No evidence of tricuspid stenosis. Aortic Valve: The aortic valve is normal in structure. Aortic valve regurgitation is not visualized. No aortic stenosis is present. Pulmonic Valve: The pulmonic valve was normal in structure. Pulmonic valve regurgitation  is not visualized. No evidence of pulmonic stenosis. Aorta: The aortic root is normal in size and structure. Venous: The inferior vena cava is normal in size with greater than 50% respiratory variability, suggesting right atrial pressure of 3 mmHg. IAS/Shunts: No atrial level shunt detected by color flow Doppler.  LEFT VENTRICLE PLAX 2D LVIDd:         3.80 cm   Diastology LVIDs:         2.70 cm   LV e' medial:    8.27 cm/s LV PW:         1.00 cm   LV E/e' medial:  7.9 LV IVS:        1.20 cm   LV e' lateral:   7.18 cm/s LVOT diam:     2.00 cm   LV E/e' lateral: 9.1 LV SV:         48 LV SV Index:   30 LVOT Area:     3.14 cm  RIGHT VENTRICLE            IVC RV Basal diam:  2.00 cm    IVC diam: 1.50 cm RV S prime:     7.62 cm/s TAPSE (M-mode): 2.0 cm LEFT ATRIUM             Index        RIGHT ATRIUM          Index LA diam:        2.70 cm 1.69 cm/m   RA Area:     9.57 cm LA Vol (A2C):    23.8 ml 14.89 ml/m  RA Volume:   16.20 ml 10.13 ml/m LA Vol (A4C):   21.2 ml 13.26 ml/m LA Biplane Vol: 23.0 ml 14.39 ml/m  AORTIC VALVE LVOT Vmax:   69.20 cm/s LVOT Vmean:  51.700 cm/s LVOT VTI:    0.153 m  AORTA Ao Root diam: 2.90 cm Ao Asc diam:  2.70 cm Ao Desc diam: 1.90 cm MITRAL VALVE MV Area (PHT): 3.31 cm    SHUNTS MV Decel Time: 229 msec    Systemic VTI:  0.15 m MV E velocity: 65.10 cm/s  Systemic Diam: 2.00 cm MV A velocity: 47.60 cm/s MV E/A ratio:  1.37 Belva Crome MD Electronically signed by Belva Crome MD Signature Date/Time: 01/30/2022/10:52:36 AM    Final       EXAM: 10/13/2020 CT CHEST WITHOUT CONTRAST LOW-DOSE FOR LUNG CANCER SCREENING   TECHNIQUE:  Multidetector CT imaging of the chest was performed following the  standard protocol without IV contrast.   COMPARISON: None.   FINDINGS:  Cardiovascular: Atherosclerotic calcification of the aorta. Heart  size normal. No pericardial effusion.  Mediastinum/Nodes: No pathologically enlarged mediastinal or  axillary lymph nodes. Hilar regions are difficult to definitively  evaluate without IV contrast but appear grossly unremarkable. There  may be distal esophageal wall thickening which can be seen with  gastroesophageal reflux.  Lungs/Pleura: Mild biapical pleuroparenchymal scarring.  Centrilobular and paraseptal emphysema. Smoking related respiratory  bronchiolitis. 11.2 mm peripheral left lower lobe nodule (4/178) has  somewhat lobulated borders. Other pulmonary nodules measure 5.3 mm  or less in size. No pleural fluid. Airway is unremarkable.  Upper Abdomen: Visualized portions of the liver, gallbladder,  adrenal glands, kidneys, spleen, pancreas, stomach and bowel are  grossly unremarkable.  Musculoskeletal: No worrisome lytic or sclerotic lesions.   IMPRESSION:  1. Lung-RADS 4A, suspicious. Follow up low-dose chest CT without  contrast in 3 months (please use the following  order, "CT CHEST LCS  NODULE  FOLLOW-UP W/O CM") is recommended. Alternatively, PET may be  considered when there is a solid component 43mm or larger. 11.2 mm  peripheral left lower lobe nodule. These results will be called to  the ordering clinician or representative by the Radiologist  Assistant, and communication documented in the PACS or [employer].  2. Aortic atherosclerosis (ICD10-I70.0).  3. Emphysema (ICD10-J43.9).   EXAM: 11/01/2020 NUCLEAR MEDICINE PET SKULL BASE TO THIGH   TECHNIQUE: 6.51 mCi F-18 FDG was injected intravenously. Full-ring PET imaging was performed from the skull base to thigh after the radiotracer. CT data was obtained and used for attenuation correction and anatomic localization.   Fasting blood glucose: 103 mg/dl   COMPARISON:  Chest CT 10/13/2020   FINDINGS: Mediastinal blood pool activity: SUV max 2.13   Liver activity: SUV max NA   NECK: No hypermetabolic lymph nodes in the neck.   Incidental CT findings: none   CHEST: 12.5 mm lobulated left lower lobe subpleural pulmonary nodule is hypermetabolic with SUV max of 5.65 and consistent with primary lung neoplasm.   No enlarged or hypermetabolic mediastinal or hilar lymph nodes to suggest metastatic adenopathy. No supraclavicular or axillary adenopathy.   No other pulmonary lesions.   Incidental CT findings: Stable underlying emphysematous changes and stable vascular calcifications.   ABDOMEN/PELVIS: No abnormal hypermetabolic activity within the liver, pancreas, adrenal glands, or spleen. No hypermetabolic lymph nodes in the abdomen or pelvis.   Incidental CT findings: Scattered atherosclerotic calcifications involving the aorta and iliac arteries.   SKELETON: No significant bony findings. No evidence of metastatic disease.   Incidental CT findings: none   IMPRESSION: 1. Solitary 12.5 mm left lower lobe pulmonary nodule is hypermetabolic and consistent with small primary lung neoplasm. 2. No findings  for mediastinal or hilar adenopathy or metastatic disease elsewhere.  FINAL MICROSCOPIC DIAGNOSIS: 01/15/2021  A. LUNG, LLL, BRUSH:  - Atypical cells present   B. LUNG, LLL, FINE NEEDLE ASPIRATION:  - Malignant cells consistent with adenocarcinoma, see comment   COMMENT:   B.   Immunohistochemical stains show that the tumor cells are positive  for TTF-1 while they are negative for p63 and CK5/6, consistent with  above interpretation.  Dr. Luisa Hart reviewed the case and concurs with  the diagnosis.  Dr. Delton Coombes was notified on 01/19/2021.      I,Gabriella Ballesteros,acting as a scribe for Dellia Beckwith, MD.,have documented all relevant documentation on the behalf of Dellia Beckwith, MD,as directed by  Dellia Beckwith, MD while in the presence of Dellia Beckwith, MD.

## 2022-02-07 ENCOUNTER — Inpatient Hospital Stay: Payer: Medicare HMO

## 2022-02-07 ENCOUNTER — Telehealth: Payer: Self-pay

## 2022-02-07 ENCOUNTER — Other Ambulatory Visit: Payer: Self-pay | Admitting: Pharmacist

## 2022-02-07 ENCOUNTER — Encounter: Payer: Self-pay | Admitting: Oncology

## 2022-02-07 ENCOUNTER — Inpatient Hospital Stay: Payer: Medicare HMO | Attending: Hematology and Oncology | Admitting: Oncology

## 2022-02-07 ENCOUNTER — Other Ambulatory Visit: Payer: Self-pay | Admitting: Oncology

## 2022-02-07 VITALS — BP 147/94 | HR 66 | Temp 98.5°F | Resp 18 | Ht 59.0 in | Wt 137.2 lb

## 2022-02-07 DIAGNOSIS — Z885 Allergy status to narcotic agent status: Secondary | ICD-10-CM | POA: Insufficient documentation

## 2022-02-07 DIAGNOSIS — J449 Chronic obstructive pulmonary disease, unspecified: Secondary | ICD-10-CM | POA: Diagnosis not present

## 2022-02-07 DIAGNOSIS — Z8042 Family history of malignant neoplasm of prostate: Secondary | ICD-10-CM | POA: Insufficient documentation

## 2022-02-07 DIAGNOSIS — B370 Candidal stomatitis: Secondary | ICD-10-CM

## 2022-02-07 DIAGNOSIS — Z79899 Other long term (current) drug therapy: Secondary | ICD-10-CM | POA: Insufficient documentation

## 2022-02-07 DIAGNOSIS — Z87891 Personal history of nicotine dependence: Secondary | ICD-10-CM | POA: Insufficient documentation

## 2022-02-07 DIAGNOSIS — I10 Essential (primary) hypertension: Secondary | ICD-10-CM | POA: Diagnosis not present

## 2022-02-07 DIAGNOSIS — E876 Hypokalemia: Secondary | ICD-10-CM | POA: Insufficient documentation

## 2022-02-07 DIAGNOSIS — C3492 Malignant neoplasm of unspecified part of left bronchus or lung: Secondary | ICD-10-CM

## 2022-02-07 DIAGNOSIS — Z90722 Acquired absence of ovaries, bilateral: Secondary | ICD-10-CM | POA: Insufficient documentation

## 2022-02-07 DIAGNOSIS — Z8719 Personal history of other diseases of the digestive system: Secondary | ICD-10-CM | POA: Insufficient documentation

## 2022-02-07 DIAGNOSIS — C3432 Malignant neoplasm of lower lobe, left bronchus or lung: Secondary | ICD-10-CM | POA: Insufficient documentation

## 2022-02-07 DIAGNOSIS — Z90710 Acquired absence of both cervix and uterus: Secondary | ICD-10-CM | POA: Insufficient documentation

## 2022-02-07 DIAGNOSIS — Z800 Family history of malignant neoplasm of digestive organs: Secondary | ICD-10-CM | POA: Diagnosis not present

## 2022-02-07 DIAGNOSIS — K219 Gastro-esophageal reflux disease without esophagitis: Secondary | ICD-10-CM | POA: Insufficient documentation

## 2022-02-07 DIAGNOSIS — J3489 Other specified disorders of nose and nasal sinuses: Secondary | ICD-10-CM | POA: Diagnosis not present

## 2022-02-07 DIAGNOSIS — F4481 Dissociative identity disorder: Secondary | ICD-10-CM | POA: Diagnosis not present

## 2022-02-07 DIAGNOSIS — Z79890 Hormone replacement therapy (postmenopausal): Secondary | ICD-10-CM | POA: Diagnosis not present

## 2022-02-07 DIAGNOSIS — F1290 Cannabis use, unspecified, uncomplicated: Secondary | ICD-10-CM | POA: Diagnosis not present

## 2022-02-07 LAB — CBC WITH DIFFERENTIAL (CANCER CENTER ONLY)
Abs Immature Granulocytes: 0.02 10*3/uL (ref 0.00–0.07)
Basophils Absolute: 0.1 10*3/uL (ref 0.0–0.1)
Basophils Relative: 2 %
Eosinophils Absolute: 0.7 10*3/uL — ABNORMAL HIGH (ref 0.0–0.5)
Eosinophils Relative: 9 %
HCT: 38.6 % (ref 36.0–46.0)
Hemoglobin: 13 g/dL (ref 12.0–15.0)
Immature Granulocytes: 0 %
Lymphocytes Relative: 40 %
Lymphs Abs: 3.2 10*3/uL (ref 0.7–4.0)
MCH: 31.6 pg (ref 26.0–34.0)
MCHC: 33.7 g/dL (ref 30.0–36.0)
MCV: 93.7 fL (ref 80.0–100.0)
Monocytes Absolute: 0.7 10*3/uL (ref 0.1–1.0)
Monocytes Relative: 9 %
Neutro Abs: 3.3 10*3/uL (ref 1.7–7.7)
Neutrophils Relative %: 40 %
Platelet Count: 267 10*3/uL (ref 150–400)
RBC: 4.12 MIL/uL (ref 3.87–5.11)
RDW: 14.6 % (ref 11.5–15.5)
WBC Count: 8.2 10*3/uL (ref 4.0–10.5)
nRBC: 0 % (ref 0.0–0.2)

## 2022-02-07 LAB — CMP (CANCER CENTER ONLY)
ALT: 25 U/L (ref 0–44)
AST: 62 U/L — ABNORMAL HIGH (ref 15–41)
Albumin: 4.4 g/dL (ref 3.5–5.0)
Alkaline Phosphatase: 56 U/L (ref 38–126)
Anion gap: 10 (ref 5–15)
BUN: 8 mg/dL (ref 8–23)
CO2: 30 mmol/L (ref 22–32)
Calcium: 9.3 mg/dL (ref 8.9–10.3)
Chloride: 96 mmol/L — ABNORMAL LOW (ref 98–111)
Creatinine: 1.21 mg/dL — ABNORMAL HIGH (ref 0.44–1.00)
GFR, Estimated: 50 mL/min — ABNORMAL LOW (ref >60)
Glucose, Bld: 94 mg/dL (ref 70–99)
Potassium: 2.7 mmol/L — CL (ref 3.5–5.1)
Sodium: 136 mmol/L (ref 135–145)
Total Bilirubin: 1.1 mg/dL (ref 0.3–1.2)
Total Protein: 7.8 g/dL (ref 6.5–8.1)

## 2022-02-07 LAB — TSH: TSH: 201.807 u[IU]/mL — ABNORMAL HIGH (ref 0.350–4.500)

## 2022-02-07 MED ORDER — FLUCONAZOLE 100 MG PO TABS: 100.0000 mg | ORAL_TABLET | Freq: Every day | ORAL | 0 refills | Status: DC

## 2022-02-07 NOTE — Progress Notes (Signed)
CRITICAL VALUE STICKER  CRITICAL VALUE:  K+ 2.7  RECEIVER (on-site recipient of call):  [name] D. RN     [place] NOTIFIED:  02/07/2022 @ 1321  MESSENGER (representative from lab):  [facility]  MD NOTIFIED:  Dr. Hinton Rao   TIME OF NOTIFICATION:  [phone]

## 2022-02-07 NOTE — Telephone Encounter (Signed)
-----   Message from Derwood Kaplan, MD sent at 02/07/2022  3:02 PM EST ----- Regarding: Rx I sent in Diflucan, would you send in magic mouthwash?

## 2022-02-07 NOTE — Telephone Encounter (Signed)
Prescription for Magic Mouthwash called into pharmacy

## 2022-02-08 ENCOUNTER — Other Ambulatory Visit: Payer: Self-pay | Admitting: Oncology

## 2022-02-08 ENCOUNTER — Inpatient Hospital Stay: Payer: Medicare HMO

## 2022-02-08 DIAGNOSIS — C3492 Malignant neoplasm of unspecified part of left bronchus or lung: Secondary | ICD-10-CM

## 2022-02-08 LAB — T4: T4, Total: 1.3 ug/dL — ABNORMAL LOW (ref 4.5–12.0)

## 2022-02-08 MED FILL — Pembrolizumab IV Soln 100 MG/4ML (25 MG/ML): INTRAVENOUS | Qty: 8 | Status: AC

## 2022-02-11 ENCOUNTER — Encounter: Payer: Self-pay | Admitting: Oncology

## 2022-02-11 ENCOUNTER — Inpatient Hospital Stay: Payer: Medicare HMO

## 2022-02-11 ENCOUNTER — Ambulatory Visit: Payer: Medicare HMO

## 2022-02-12 ENCOUNTER — Telehealth: Payer: Self-pay | Admitting: Hematology and Oncology

## 2022-02-12 NOTE — Telephone Encounter (Signed)
The patient was scheduled for IV potassium yesterday and did not show for her appointment.  I contacted her today to have her come in for IV potassium and she did not answer.  I left a message asking her to call to come in today for the IV potassium.

## 2022-02-14 ENCOUNTER — Encounter: Payer: Self-pay | Admitting: Oncology

## 2022-02-14 NOTE — Telephone Encounter (Signed)
The patient did not call me back to reschedule potassium. I did not realize she missed her pembrolizumab as well. I attempted to phone her again today and now the number says "call cannot be completed at this time".

## 2022-02-15 ENCOUNTER — Encounter: Payer: Self-pay | Admitting: Oncology

## 2022-02-26 NOTE — Progress Notes (Signed)
[facility]  [address] 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 This was initially found on lung cancer screening CT in September 2022. Most recent CT imaging revealed the solitary left lower lobe nodule to be enlarging, measuring 1.2 cm in September and now measuring 1.4 x 1.9 cm.  She initially met with Dr. Kipp Brood for consultation, and he recommended SBRT as he felt that her smoking would cause excessive risk for postoperative complications.  She sought another opinion with  Dr. Richard Miu in [place] who agreed to her surgical management. Her daughter lives in [place] and will be her support system.  She has now had her surgery on April 12 with left thoracotomy and left lower lobe lobectomy along with mediastinal lymph node dissection.  Pathology revealed an adenocarcinoma grade 2 measuring 2.5 cm in diameter but with no evidence of visceral pleural invasion or direct invasion of adjacent structures.  All margins are negative for invasive carcinoma.  She did have involvement of 2 out of 20 lymph nodes for a T1c N1 M0, stage IIB.  EGFR and ALK mutations were negative but PD-L1 was 90%.  Her doctor there recommended chemotherapy with immunotherapy but she refused chemotherapy.  She was only willing to take the immunotherapy for 1 year and had a port placed.  She had her first dose on September 7 and her second dose on September 28.  She has now relocated to [facility] and we have continued this regimen, and plan 1 year.    Tobacco abuse.  She has not smoked at all since April 12 when she had her surgery. "She fell off the wagon" recently, but has stopped again.   3.     Dissociative identity disorder with 3 personalities. She continues to follow with a psychiatrist.   4.      Hypokalemia.  Potassium was down to 2.7 last visit and so she is on supplement now and we will increase it to twice daily. She declined to come in for potassium IV. She did increase it in her diet and today it is up to 3.3, so I recommended that she continue what she is doing.   Plan: Her potassium last visit was 2.7, today is was 3.3 and I still have recommended that she continue 2 potassium supplements a day and plenty of potassium in her diet. We will plan to schedule a CT chest scan sometime this month. We will continue with scans every 3 months. She will continue with treatment, I will plan to see her back in 3 weeks with scan prior to this visit along with CBC and CMP. Later we will need to arrange for annual mammography and colonoscopy when she is due since she does have a history of colon polyps.  She understands and agrees with this plan of care. I have answered her questions and she knows to call with any concerns.    I provided 20 minutes of face-to-face time during this this encounter and > 50% was spent counseling as documented under my assessment and plan.    Derwood Kaplan, MD [facility] [address] [place] [address] Dept: [phone] Dept Fax: [phone]    CHIEF COMPLAINT:  CC: Adenocarcinoma of the left  lung  Current Treatment:  Referral for 2nd opinion on surgical resection   HISTORY OF PRESENT ILLNESS:  Sydney Hunt is a 62 y.o. female with a history of tobacco abuse who has adenocarcinoma of the left lung. This was originally found on lung cancer screening CT from September 2022. PET imaging was obtained in October which revealed a solitary 1.25 cm left lower lobe pulmonary nodule to be hypermetabolic and consistent with small primary lung neoplasm. No findings for mediastinal or hilar adenopathy or metastatic disease elsewhere was observed. She met with the genetic counselor and [facility]  diagnostic testing was negative for any clinically significant mutations. CT chest from December revealed enlarging left lower lobe nodule, measuring 1.4 x 1.9 cm. The additional smaller pulmonary nodules remained unchanged. She was referred to Dr. Baltazar Apo and underwent bronchoscopy on December 19th and cytology from this procedure confirmed malignant cells consistent with adenocarcioma. Immunohistochemical stains were positive for TTF-1, and negative for p63 and CK5/6. 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 Her FEV1 was 1.98, which is 88% of predicted and her FVC was 2.58, which is 89% of predicted. She was deemed a candidate for resection and met with Dr. Kipp Brood for consultation. As she is not motivated to quit smoking, he felt that surgery would present an excessive risk for postoperative respiratory complications. He recommended SBRT as a good option if the patient was unable to quit smoking.  The patient went for a second opinion to Dr. Asencion Gowda and surgical resection was carried out on April 12 with a left lower lobe lobectomy.  She was hospitalized for 13 days and has not smoked since that time.  Pathology revealed a 2.5 cm adenocarcinoma, grade 2.  There was no evidence of pleural invasion and margins are clear but she tells me 2 of 20 nodes were positive, for a T1c N1 M0, stage IIB.  A port was placed on August 16 and she was started on pembrolizumab on September 7 and had her second dose on September 28.  INTERVAL HISTORY:  I have reviewed her chart and materials related to her cancer extensively and collaborated history with the patient. Summary of oncologic history is as follows: Oncology History  Non-small cell lung cancer, left ([facility])  01/15/2021 Initial Diagnosis   Non-small cell lung cancer, left ([facility])   02/12/2021 Cancer Staging   Staging form: Lung, AJCC 8th Edition - Clinical stage from 02/12/2021: Stage IA2 (cT1b, cN0, cM0) - Signed by Derwood Kaplan, MD on 02/14/2021 Histopathologic type: Adenocarcinoma, NOS Stage prefix: Initial diagnosis Histologic grade (G): GX Histologic grading system: 4 grade system Laterality: Left Tumor size (mm): 19 Lymph-vascular invasion (LVI): LVI not present (absent)/not identified Diagnostic confirmation: Positive histology Specimen type: Bronchial Biopsy Staged by: Managing physician Stage used in treatment planning: Yes National guidelines used in treatment planning: Yes Type of national guideline used in treatment planning: NCCN Staging comments: Rec surgical resection   05/11/2021 Cancer Staging   Staging form: Lung, AJCC 8th Edition - Pathologic stage from 05/11/2021: Stage IIB (ypT1c, pN1, cM0) - Signed by Derwood Kaplan, MD on 11/12/2021 Histopathologic type: Adenocarcinoma, NOS Stage prefix: Post-therapy Histologic grade (G): G2 Histologic grading system: 4 grade system Residual tumor (R): R0 - None Laterality: Left Tumor size (mm): 25 Lymph-vascular invasion (LVI): LVI not present (absent)/not identified Diagnostic confirmation: Positive histology PLUS positive immunophenotyping and/or positive genetic studies Specimen type: Excision Staged by: Managing physician Type of lung cancer: Surgically resected non-small cell lung cancer  ECOG performance status: Grade 1 Perineural invasion (PNI): Absent Pleural/elastic layer invasion: PL0 Pleural lavage cytology: Unknown Weight loss: Absent Adequacy of mediastinal dissection: Adequate Radiotherapy dose: No Adjuvant radiation: No Adjuvant chemotherapy: Yes Stage used in treatment planning: Yes National guidelines used in treatment planning: Yes Type of national guideline used in treatment planning: NCCN Staging comments: Adjuvant pembrolizumab, PDL 1 90%   10/04/2021 -  Chemotherapy   Patient is on Treatment Plan : LUNG NSCLC Pembrolizumab Adj q21d x 18 cycles     Interval History  Sydney Hunt is here today for a routine  follow up prior to her 7th cycle of Pembrolizumab. Her potassium was 2.7 last visit, this is now 3.3. She was scheduled to get IV potassium but did not show or reschedule this and did not take her immunotherapy either. She states she has been taking her thyroid medication and potassium when she can, but she had increased in her diet. I have recommended she take 2 a day for her potassium supplement. She is not taking her medications and she says this is due to her living arrangement, she does state she may be renting a room but is waiting for more confirmation. She does note that she did smoke again but she is trying to stop, and this is also due to stress. She still has a cough. She denies fever, chills or other signs of infection.  She denies vomiting or abdominal pain.  She denies sore throat, or chest pain. Her weight is down 5 pounds since her last visit, her appetite is poor due to stress.   HISTORY:   Past Medical History:  Diagnosis Date   Allergy    Arthritis    Complication of anesthesia    slow to wake up   COPD (chronic obstructive pulmonary disease) (HCC)    COPD, mild ([facility]) 12/15/2020   Family history of pancreatic cancer 11/28/2020   Family history of prostate cancer 11/28/2020   Family history of stomach cancer 11/28/2020   Genetic testing 12/18/2020   [facility] Multi-Cancer Panel was Negative. Report date is 12/12/2020.     The Multi-Cancer + RNA Panel offered by [facility] includes sequencing and/or deletion/duplication analysis of the following 84 genes:  AIP*, ALK, APC*, ATM*, AXIN2*, BAP1*, BARD1*, BLM*, BMPR1A*, BRCA1*, BRCA2*, BRIP1*, CASR, CDC73*, CDH1*, CDK4, CDKN1B*, CDKN1C*, CDKN2A, CEBPA, CHEK2*, CTNNA1*, DICER1*, DIS3L2*, EGFR, EPCAM, FH   GERD (gastroesophageal reflux disease)    Heartburn 12/27/2021   Hormone disorder 11/20/2020   Hypertension    Hypokalemia 12/27/2021   Leukocytosis 01/17/2022   Migraines    Nodule of left lung 11/21/2020   Non-small cell lung  cancer, left ([facility]) 01/15/2021   Tobacco use 12/07/2020    Past Surgical History:  Procedure Laterality Date   ABDOMINAL HYSTERECTOMY  1989   unsure of vaginal or abdominal   BRONCHIAL BIOPSY  01/15/2021   Procedure: BRONCHIAL BIOPSIES;  Surgeon: Collene Gobble, MD;  Location: [facility];  Service: Pulmonary;;   BRONCHIAL BRUSHINGS  01/15/2021   Procedure: BRONCHIAL BRUSHINGS;  Surgeon: Collene Gobble, MD;  Location: [facility];  Service: Pulmonary;;   BRONCHIAL NEEDLE ASPIRATION BIOPSY  01/15/2021   Procedure: BRONCHIAL NEEDLE ASPIRATION BIOPSIES;  Surgeon: Collene Gobble, MD;  [facility] ENDOSCOPY;  Service: Pulmonary;;   COLONOSCOPY  2022   FIDUCIAL MARKER PLACEMENT  01/15/2021   Procedure: FIDUCIAL MARKER PLACEMENT;  Surgeon: Collene Gobble, MD;  Location: [facility] ENDOSCOPY;  Service: Pulmonary;;   ovaries removed  1986   surgery  to remove scar tissue     from colon/bladder in patient's late 30's   TUBAL LIGATION  1986   UPPER GI ENDOSCOPY     years ago in patient's late 54s   [place]  01/15/2021   Procedure: VIDEO BRONCHOSCOPY WITH RADIAL ENDOBRONCHIAL ULTRASOUND;  Surgeon: Collene Gobble, MD;  Location: [facility] ENDOSCOPY;  Service: Pulmonary;;    Family History  Problem Relation Age of Onset   Pancreatic cancer Maternal Aunt 77   Brain cancer Maternal Uncle    Stomach cancer Maternal Uncle        dx. >50   Prostate cancer Half-Brother        passed away at 44   Prostate cancer Half-Brother     Social History:  reports that she quit smoking about 10 months ago. Her smoking use included cigarettes. She started smoking about 61 years ago. She has a 29.50 pack-year smoking history. She has never used smokeless tobacco. She reports current alcohol use of about 4.0 standard drinks of alcohol per week. She reports current drug use. Frequency: 3.00 times per week. Drug: Marijuana. She is widowed and lives at home.  She has quit  smoking since April 12, when she had her surgical resection.  She has 2 biological children and 1 stepchild. She is disabled, and has been exposed to chemicals or other toxic agents. She was previously employed at a hosiery, chicken farm and cleaning houses. 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  Allergies:  Allergies  Allergen Reactions   Codeine Rash   Demerol [Meperidine Hcl] Other (See Comments)    coma   Other Dermatitis    Stainless steel / skin weeps   Silver Dermatitis    Sterling silver skin weeps   Gold Bond [Menthol-Zinc Oxide] Dermatitis    Skin weeps   Neosporin [Bacitracin-Polymyxin B] Dermatitis    Skin weeps   Wound Dressing Adhesive Other (See Comments)    CAUSES SKIN TEARS PER PATIENT. INSTEAD USE THE TEGADERM 37M WITH ADHESIVE FREE WINDOW OVER PORT.    Current Medications: Current Outpatient Medications  Medication Sig Dispense Refill   albuterol (VENTOLIN HFA) 108 (90 Base) MCG/ACT inhaler Inhale 2 puffs into the lungs every 4 (four) hours as needed for wheezing or shortness of breath.     amitriptyline (ELAVIL) 10 MG tablet Take 10 mg by mouth at bedtime as needed for sleep.     Ascorbic Acid (VITAMIN C WITH ROSE HIPS) 500 MG tablet Take 500 mg by mouth daily.     aspirin 81 MG EC tablet Take 81 mg by mouth daily.     atorvastatin (LIPITOR) 40 MG tablet Take 40 mg by mouth daily.     b complex vitamins capsule Take 1 capsule by mouth daily. (Patient not taking: Reported on 01/17/2022)     Biotin 5000 MCG CAPS Take 5,000 mcg by mouth daily.     Calcium-Vitamin D-Vitamin K (CHEWABLE CALCIUM PO) Take 650 mg by mouth daily.     celecoxib (CELEBREX) 200 MG capsule Take 200 mg by mouth daily.     fluconazole (DIFLUCAN) 100 MG tablet Take 1 tablet (100 mg total) by mouth daily. 10 tablet 0   gabapentin (NEURONTIN) 300 MG capsule Take 300 mg by mouth at bedtime.     hydrochlorothiazide (HYDRODIURIL) 25 MG tablet Take 25 mg by mouth every  morning.     HYDROcodone-acetaminophen (NORCO/VICODIN) 5-325 MG tablet Take 1 tablet by mouth every  6 (six) hours as needed for moderate pain or severe pain. 30 tablet 0   levothyroxine (SYNTHROID) 50 MCG tablet Take 1 tablet (50 mcg total) by mouth daily before breakfast. 30 tablet 2   lidocaine-prilocaine (EMLA) cream Apply 1 Application topically as needed. 30 g 0   omeprazole (PRILOSEC) 40 MG capsule Take 1 capsule (40 mg total) by mouth daily. 30 capsule 2   ondansetron (ZOFRAN) 4 MG tablet Take 1 tablet (4 mg total) by mouth every 4 (four) hours as needed for nausea. 90 tablet 3   potassium chloride SA (KLOR-CON M) 20 MEQ tablet Take 20 mEq by mouth daily.     prochlorperazine (COMPAZINE) 10 MG tablet Take 1 tablet (10 mg total) by mouth every 6 (six) hours as needed for nausea or vomiting. 30 tablet 2   vitamin E 180 MG (400 UNITS) capsule Take 400 Units by mouth daily. (Patient not taking: Reported on 01/17/2022)     No current facility-administered medications for this visit.    REVIEW OF SYSTEMS:  Review of Systems  Constitutional: Negative.  Negative for appetite change, chills, fatigue, fever and unexpected weight change.  HENT:  Negative.    Eyes: Negative.   Respiratory:  Negative for chest tightness, cough (occasionally productive with clear sputum), hemoptysis and shortness of breath. Wheezing: occasional.  Cardiovascular: Negative.  Negative for chest pain, leg swelling and palpitations.  Gastrointestinal: Negative.  Negative for abdominal distention, abdominal pain, blood in stool, constipation, diarrhea, nausea and vomiting.  Endocrine: Negative.   Genitourinary: Negative.  Negative for difficulty urinating, dysuria, frequency and hematuria.   Musculoskeletal: Negative.  Negative for arthralgias, back pain, flank pain, gait problem and myalgias.  Skin: Negative.   Neurological: Negative.  Negative for dizziness, extremity weakness, gait problem, headaches,  light-headedness, numbness, seizures and speech difficulty.  Hematological: Negative.   Psychiatric/Behavioral: Negative.  Negative for depression and sleep disturbance. The patient is not nervous/anxious.        Dissociative identity disorder      VITALS:  Blood pressure 126/87, pulse 65, temperature 98 F (36.7 C), temperature source Oral, resp. rate 17, height 4\' 11"  (1.499 m), weight 132 lb 4.8 oz (60 kg), SpO2 99 %.  Wt Readings from Last 3 Encounters:  03/04/22 131 lb (59.4 kg)  02/28/22 132 lb 4.8 oz (60 kg)  02/07/22 137 lb 3.2 oz (62.2 kg)    Body mass index is 26.72 kg/m.  Performance status (ECOG): 1 - Symptomatic but completely ambulatory  PHYSICAL EXAM:  Physical Exam Constitutional:      General: She is not in acute distress.    Appearance: Normal appearance. She is normal weight.  HENT:     Head: Normocephalic and atraumatic.  Eyes:     General: No scleral icterus.    Extraocular Movements: Extraocular movements intact.     Conjunctiva/sclera: Conjunctivae normal.     Pupils: Pupils are equal, round, and reactive to light.  Cardiovascular:     Rate and Rhythm: Normal rate and regular rhythm.     Pulses: Normal pulses.     Heart sounds: Normal heart sounds. No murmur heard.    No friction rub. No gallop.  Pulmonary:     Effort: Pulmonary effort is normal. No respiratory distress.     Breath sounds: Normal breath sounds.  Abdominal:     General: Bowel sounds are normal. There is no distension.     Palpations: Abdomen is soft. There is no hepatomegaly, splenomegaly or mass.  Tenderness: There is no abdominal tenderness.  Musculoskeletal:        General: Normal range of motion.     Cervical back: Normal range of motion and neck supple.     Right lower leg: No edema.     Left lower leg: No edema.  Lymphadenopathy:     Cervical: No cervical adenopathy.  Skin:    General: Skin is warm and dry.  Neurological:     General: No focal deficit present.      Mental Status: She is alert and oriented to person, place, and time. Mental status is at baseline.  Psychiatric:        Mood and Affect: Mood normal.        Behavior: Behavior normal.        Thought Content: Thought content normal.        Judgment: Judgment normal.      LABS:      Latest Ref Rng & Units 02/28/2022   12:00 AM 02/07/2022   11:29 AM 01/17/2022   12:00 AM  CBC  WBC  10.6     8.2  11.5      Hemoglobin 12.0 - 16.0 12.5     13.0  13.6      Hematocrit 36 - 46 36     38.6  40      Platelets 150 - 400 K/uL 307     267  318         This result is from an external source.      Latest Ref Rng & Units 02/28/2022   12:00 AM 02/07/2022   11:29 AM 01/17/2022    9:41 AM  CMP  Glucose 70 - 99 mg/dL  94  74   BUN 4 - 21 15     8  12    Creatinine 0.5 - 1.1 1.0     1.21  1.26   Sodium 137 - 147 141     136  141   Potassium 3.5 - 5.1 mEq/L 3.3     2.7  3.2   Chloride 99 - 108 102     96  103   CO2 13 - 22 29     30  29    Calcium 8.7 - 10.7 9.6     9.3  10.0   Total Protein 6.5 - 8.1 g/dL  7.8  8.1   Total Bilirubin 0.3 - 1.2 mg/dL  1.1  0.9   Alkaline Phos 25 - 125 69     56  56   AST 13 - 35 33     62  35   ALT 7 - 35 U/L 18     25  19       This result is from an external source.     Lab Results  Component Value Date   CEA1 2.8 11/08/2021   /  CEA  Date Value Ref Range Status  11/08/2021 2.8 0.0 - 4.7 ng/mL Final    Comment:    (NOTE)                             Nonsmokers          <3.9                             Smokers             <  5.6 Roche [employer] Electrochemiluminescence Immunoassay (ECLIA) Values obtained with different assay methods or kits cannot be used interchangeably.  Results cannot be interpreted as absolute evidence of the presence or absence of malignant disease. Performed At: [facility] [place], [place] [id number] Rush Farmer MD [id number]      STUDIES:  No results found.    EXAM: 10/13/2020 CT  CHEST WITHOUT CONTRAST LOW-DOSE FOR LUNG CANCER SCREENING   TECHNIQUE:  Multidetector CT imaging of the chest was performed following the  standard protocol without IV contrast.   COMPARISON: None.   FINDINGS:  Cardiovascular: Atherosclerotic calcification of the aorta. Heart  size normal. No pericardial effusion.  Mediastinum/Nodes: No pathologically enlarged mediastinal or  axillary lymph nodes. Hilar regions are difficult to definitively  evaluate without IV contrast but appear grossly unremarkable. There  may be distal esophageal wall thickening which can be seen with  gastroesophageal reflux.  Lungs/Pleura: Mild biapical pleuroparenchymal scarring.  Centrilobular and paraseptal emphysema. Smoking related respiratory  bronchiolitis. 11.2 mm peripheral left lower lobe nodule (4/178) has  somewhat lobulated borders. Other pulmonary nodules measure 5.3 mm  or less in size. No pleural fluid. Airway is unremarkable.  Upper Abdomen: Visualized portions of the liver, gallbladder,  adrenal glands, kidneys, spleen, pancreas, stomach and bowel are  grossly unremarkable.  Musculoskeletal: No worrisome lytic or sclerotic lesions.   IMPRESSION:  1. Lung-RADS 4A, suspicious. Follow up low-dose chest CT without  contrast in 3 months (please use the following order, "CT CHEST LCS  NODULE FOLLOW-UP W/O CM") is recommended. Alternatively, PET may be  considered when there is a solid component 24mm or larger. 11.2 mm  peripheral left lower lobe nodule. These results will be called to  the ordering clinician or representative by the Radiologist  Assistant, and communication documented in the PACS or [employer].  2. Aortic atherosclerosis (ICD10-I70.0).  3. Emphysema (ICD10-J43.9).   EXAM: 11/01/2020 NUCLEAR MEDICINE PET SKULL BASE TO THIGH   TECHNIQUE: 6.51 mCi F-18 FDG was injected intravenously. Full-ring PET imaging was performed from the skull base to thigh after the radiotracer.  CT data was obtained and used for attenuation correction and anatomic localization.   Fasting blood glucose: 103 mg/dl   COMPARISON:  Chest CT 10/13/2020   FINDINGS: Mediastinal blood pool activity: SUV max 2.13   Liver activity: SUV max NA   NECK: No hypermetabolic lymph nodes in the neck.   Incidental CT findings: none   CHEST: 12.5 mm lobulated left lower lobe subpleural pulmonary nodule is hypermetabolic with SUV max of 2.83 and consistent with primary lung neoplasm.   No enlarged or hypermetabolic mediastinal or hilar lymph nodes to suggest metastatic adenopathy. No supraclavicular or axillary adenopathy.   No other pulmonary lesions.   Incidental CT findings: Stable underlying emphysematous changes and stable vascular calcifications.   ABDOMEN/PELVIS: No abnormal hypermetabolic activity within the liver, pancreas, adrenal glands, or spleen. No hypermetabolic lymph nodes in the abdomen or pelvis.   Incidental CT findings: Scattered atherosclerotic calcifications involving the aorta and iliac arteries.   SKELETON: No significant bony findings. No evidence of metastatic disease.   Incidental CT findings: none   IMPRESSION: 1. Solitary 12.5 mm left lower lobe pulmonary nodule is hypermetabolic and consistent with small primary lung neoplasm. 2. No findings for mediastinal or hilar adenopathy or metastatic disease elsewhere.  FINAL MICROSCOPIC DIAGNOSIS: 01/15/2021  A. LUNG, LLL, BRUSH:  - Atypical cells present   B. LUNG, LLL, FINE NEEDLE ASPIRATION:  - Malignant  cells consistent with adenocarcinoma, see comment   COMMENT:   B.   Immunohistochemical stains show that the tumor cells are positive  for TTF-1 while they are negative for p63 and CK5/6, consistent with  above interpretation.  Dr. Saralyn Pilar reviewed the case and concurs with  the diagnosis.  Dr. Lamonte Sakai was notified on 01/19/2021.      I,Gabriella Ballesteros,acting as a scribe for Derwood Kaplan, MD.,have documented all relevant documentation on the behalf of Derwood Kaplan, MD,as directed by  Derwood Kaplan, MD while in the presence of Derwood Kaplan, MD.

## 2022-02-28 ENCOUNTER — Other Ambulatory Visit: Payer: Medicare HMO

## 2022-02-28 ENCOUNTER — Inpatient Hospital Stay: Payer: Medicare HMO | Attending: Hematology and Oncology | Admitting: Oncology

## 2022-02-28 ENCOUNTER — Inpatient Hospital Stay: Payer: Medicare HMO

## 2022-02-28 ENCOUNTER — Other Ambulatory Visit: Payer: Self-pay | Admitting: Oncology

## 2022-02-28 ENCOUNTER — Encounter: Payer: Self-pay | Admitting: Oncology

## 2022-02-28 VITALS — BP 126/87 | HR 65 | Temp 98.0°F | Resp 17 | Ht 59.0 in | Wt 132.3 lb

## 2022-02-28 DIAGNOSIS — F1290 Cannabis use, unspecified, uncomplicated: Secondary | ICD-10-CM | POA: Diagnosis not present

## 2022-02-28 DIAGNOSIS — Z90722 Acquired absence of ovaries, bilateral: Secondary | ICD-10-CM | POA: Diagnosis not present

## 2022-02-28 DIAGNOSIS — F4481 Dissociative identity disorder: Secondary | ICD-10-CM | POA: Diagnosis not present

## 2022-02-28 DIAGNOSIS — D72829 Elevated white blood cell count, unspecified: Secondary | ICD-10-CM | POA: Insufficient documentation

## 2022-02-28 DIAGNOSIS — J439 Emphysema, unspecified: Secondary | ICD-10-CM | POA: Diagnosis not present

## 2022-02-28 DIAGNOSIS — C3492 Malignant neoplasm of unspecified part of left bronchus or lung: Secondary | ICD-10-CM

## 2022-02-28 DIAGNOSIS — Z79620 Long term (current) use of immunosuppressive biologic: Secondary | ICD-10-CM | POA: Diagnosis not present

## 2022-02-28 DIAGNOSIS — Z79899 Other long term (current) drug therapy: Secondary | ICD-10-CM | POA: Insufficient documentation

## 2022-02-28 DIAGNOSIS — J449 Chronic obstructive pulmonary disease, unspecified: Secondary | ICD-10-CM | POA: Diagnosis not present

## 2022-02-28 DIAGNOSIS — Z87891 Personal history of nicotine dependence: Secondary | ICD-10-CM | POA: Diagnosis not present

## 2022-02-28 DIAGNOSIS — Z8042 Family history of malignant neoplasm of prostate: Secondary | ICD-10-CM | POA: Diagnosis not present

## 2022-02-28 DIAGNOSIS — Z808 Family history of malignant neoplasm of other organs or systems: Secondary | ICD-10-CM | POA: Diagnosis not present

## 2022-02-28 DIAGNOSIS — Z885 Allergy status to narcotic agent status: Secondary | ICD-10-CM | POA: Insufficient documentation

## 2022-02-28 DIAGNOSIS — K219 Gastro-esophageal reflux disease without esophagitis: Secondary | ICD-10-CM | POA: Diagnosis not present

## 2022-02-28 DIAGNOSIS — Z800 Family history of malignant neoplasm of digestive organs: Secondary | ICD-10-CM | POA: Diagnosis not present

## 2022-02-28 DIAGNOSIS — I10 Essential (primary) hypertension: Secondary | ICD-10-CM | POA: Diagnosis not present

## 2022-02-28 DIAGNOSIS — F17210 Nicotine dependence, cigarettes, uncomplicated: Secondary | ICD-10-CM | POA: Insufficient documentation

## 2022-02-28 DIAGNOSIS — I700 Atherosclerosis of aorta: Secondary | ICD-10-CM | POA: Diagnosis not present

## 2022-02-28 DIAGNOSIS — Z90710 Acquired absence of both cervix and uterus: Secondary | ICD-10-CM | POA: Diagnosis not present

## 2022-02-28 DIAGNOSIS — Z5112 Encounter for antineoplastic immunotherapy: Secondary | ICD-10-CM | POA: Diagnosis present

## 2022-02-28 DIAGNOSIS — E876 Hypokalemia: Secondary | ICD-10-CM | POA: Insufficient documentation

## 2022-02-28 DIAGNOSIS — Z8719 Personal history of other diseases of the digestive system: Secondary | ICD-10-CM | POA: Diagnosis not present

## 2022-02-28 DIAGNOSIS — C3432 Malignant neoplasm of lower lobe, left bronchus or lung: Secondary | ICD-10-CM | POA: Insufficient documentation

## 2022-02-28 LAB — CBC AND DIFFERENTIAL
HCT: 36 (ref 36–46)
Hemoglobin: 12.5 (ref 12.0–16.0)
Neutrophils Absolute: 5.09
Platelets: 307 10*3/uL (ref 150–400)
WBC: 10.6

## 2022-02-28 LAB — HEPATIC FUNCTION PANEL
ALT: 18 U/L (ref 7–35)
AST: 33 (ref 13–35)
Alkaline Phosphatase: 69 (ref 25–125)
Bilirubin, Total: 1.5

## 2022-02-28 LAB — COMPREHENSIVE METABOLIC PANEL
Albumin: 4.7 (ref 3.5–5.0)
Calcium: 9.6 (ref 8.7–10.7)

## 2022-02-28 LAB — CBC: RBC: 3.9 (ref 3.87–5.11)

## 2022-02-28 LAB — BASIC METABOLIC PANEL
BUN: 15 (ref 4–21)
CO2: 29 — AB (ref 13–22)
Chloride: 102 (ref 99–108)
Creatinine: 1 (ref 0.5–1.1)
Glucose: 109
Potassium: 3.3 mEq/L — AB (ref 3.5–5.1)
Sodium: 141 (ref 137–147)

## 2022-02-28 LAB — CBC W DIFFERENTIAL (~~LOC~~ CC SCANNED REPORT)

## 2022-02-28 LAB — COMPREHENSIVE METABOLIC PANEL (ASHBORO CC SCANNED REPORT)

## 2022-02-28 LAB — TSH: TSH: 47.71 u[IU]/mL — ABNORMAL HIGH (ref 0.350–4.500)

## 2022-03-01 ENCOUNTER — Encounter: Payer: Self-pay | Admitting: Oncology

## 2022-03-01 MED FILL — Pembrolizumab IV Soln 100 MG/4ML (25 MG/ML): INTRAVENOUS | Qty: 8 | Status: AC

## 2022-03-02 LAB — T4: T4, Total: 8.5 ug/dL (ref 4.5–12.0)

## 2022-03-04 ENCOUNTER — Inpatient Hospital Stay: Payer: Medicare HMO

## 2022-03-04 VITALS — BP 111/66 | HR 85 | Temp 98.0°F | Resp 18 | Ht 59.0 in | Wt 131.0 lb

## 2022-03-04 DIAGNOSIS — C3492 Malignant neoplasm of unspecified part of left bronchus or lung: Secondary | ICD-10-CM

## 2022-03-04 DIAGNOSIS — Z5112 Encounter for antineoplastic immunotherapy: Secondary | ICD-10-CM | POA: Diagnosis not present

## 2022-03-04 MED ORDER — SODIUM CHLORIDE 0.9 % IV SOLN: Freq: Once | INTRAVENOUS | Status: AC

## 2022-03-04 MED ORDER — SODIUM CHLORIDE 0.9% FLUSH: 10.0000 mL | INTRAVENOUS | Status: DC | PRN

## 2022-03-04 MED ORDER — HEPARIN SOD (PORK) LOCK FLUSH 100 UNIT/ML IV SOLN: 500.0000 [IU] | Freq: Once | INTRAVENOUS | Status: DC | PRN

## 2022-03-04 MED ORDER — SODIUM CHLORIDE 0.9 % IV SOLN
200.0000 mg | Freq: Once | INTRAVENOUS | Status: AC
  Administered 2022-03-04: 200 mg via INTRAVENOUS
  Filled 2022-03-04 (×2): qty 8

## 2022-03-04 NOTE — Patient Instructions (Signed)

## 2022-03-12 ENCOUNTER — Encounter: Payer: Medicare HMO | Admitting: Dietician

## 2022-03-12 ENCOUNTER — Telehealth: Payer: Self-pay | Admitting: Dietician

## 2022-03-12 NOTE — Telephone Encounter (Signed)
Received text declining nutrition consult. She states she wanted to lose weight.    April Manson, RDN, LDN Registered Dietitian, [place] [facility] Part Time Remote (Usual office hours: Tuesday-Thursday) Mobile: [phone] Remote Office: [phone]

## 2022-03-12 NOTE — Telephone Encounter (Signed)
Patient screened on MST. First attempt to reach. Provided my cell# on voice mail and in text to return call to set up a nutrition consult.  April Manson, RDN, LDN Registered Dietitian, [facility] Part Time Remote (Usual office hours: Tuesday-Thursday) Cell: [phone]

## 2022-03-13 ENCOUNTER — Encounter: Payer: Self-pay | Admitting: Oncology

## 2022-03-13 NOTE — Progress Notes (Signed)
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 This was initially found on lung cancer screening CT in September 2022. Most recent CT imaging revealed the solitary left lower lobe nodule to be enlarging, measuring 1.2 cm in September and now measuring 1.4 x 1.9 cm.  She initially met with Dr. Kipp [facility] for consultation, and he recommended SBRT as he felt that her smoking would cause excessive risk for postoperative complications.  She sought another opinion with  Dr. Richard [facility] in [place] who agreed to her surgical management. Her daughter lives in [place] and will be her support system.  She has now had her surgery on April 12 with left thoracotomy and left lower lobe lobectomy along with mediastinal lymph node dissection.  Pathology revealed an adenocarcinoma grade 2 measuring 2.5 cm in diameter but with no evidence of visceral pleural invasion or direct invasion of adjacent structures.  All margins are negative for invasive carcinoma.  She did have involvement of 2 out of 20 lymph nodes for a T1c N1 M0, stage IIB.  EGFR and ALK mutations were negative but PD-L1 was 90%.  Her doctor there recommended chemotherapy with immunotherapy but she refused chemotherapy.  She was only willing to take the immunotherapy for 1 year and had a port placed.  She had her first dose on September 7 and her second dose on September 28.  She has now relocated to [facility] and we have continued this regimen, and plan 1 year.     Tobacco abuse.  She has not smoked at all since April 12 when she had her surgery. "She fell off the wagon" recently, but has stopped again.    3.     Dissociative identity disorder with 3 personalities. She continues to follow with a psychiatrist.    4.      Hypokalemia.  Potassium was down to 2.7 last visit and so she is on supplement now and we will increase it to twice daily. She declined to come in for potassium IV. She did increase it in her diet and today it is up to 3.3, so I recommended that she continue what she is doing.     Plan: Her potassium last visit was 2.7, today is was 3.3 and I still have recommended that she continue 2 potassium supplements a day and plenty of potassium in her diet. We will plan to schedule a CT chest scan sometime this month. We will continue with scans every 3 months. She will continue with treatment, I will plan to see her back in 3 weeks with scan prior to this visit along with CBC and CMP. Later we will need to arrange for annual mammography and colonoscopy when she is due since she does have a history of colon polyps.  She understands and agrees with this plan of care. I have answered her questions and she knows to call with any concerns.      I provided 20 minutes of face-to-face time during this this encounter and > 50% was spent counseling as documented under my assessment and plan.      Sydney Kaplan, MD [facility] [address] [place] [address] Dept:  [phone] Dept Fax: [phone]      CHIEF COMPLAINT:  CC: Adenocarcinoma of the left lung   Current Treatment:  Referral for 2nd opinion on surgical resection     HISTORY OF PRESENT ILLNESS:  S[facility]ney [facility] is a 62 y.o. female with a history of tobacco abuse who has adenocarcinoma of the left lung. This was originally found on lung cancer screening CT from September 2022. PET imaging was obtained in October which revealed a solitary 1.25 cm left lower lobe pulmonary nodule to be hypermetabolic and consistent with small primary lung neoplasm. No findings for mediastinal or hilar adenopathy or metastatic disease elsewhere was observed. She met with the genetic counselor and  [employer] diagnostic testing was negative for any clinically significant mutations. CT chest from December revealed enlarging left lower lobe nodule, measuring 1.4 x 1.9 cm. The additional smaller pulmonary nodules remained unchanged. She was referred to Dr. Baltazar [facility] and underwent bronchoscopy on December 19th and cytology from this procedure confirmed malignant cells consistent with adenocarcioma. Immunohistochemical stains were positive for TTF-1, and negative for p63 and CK5/6. 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 Her FEV1 was 1.98, which is 88% of predicted and her FVC was 2.58, which is 89% of predicted. She was deemed a candidate for resection and met with Dr. Kipp [facility] for consultation. As she is not motivated to quit smoking, he felt that surgery would present an excessive risk for postoperative respiratory complications. He recommended SBRT as a good option if the patient was unable to quit smoking.  The patient went for a second opinion to Dr. Asencion Hunt and surgical resection was carried out on April 12 with a left lower lobe lobectomy.  She was hospitalized for 13 days and has not smoked since that time.  Pathology revealed a 2.5 cm adenocarcinoma, grade 2.  There was no evidence of pleural invasion and margins are clear but she tells me 2 of 20 nodes were positive, for a T1c N1 M0, stage IIB.  A port was placed on August 16 and she was started on pembrolizumab on September 7 and had her second dose on September 28.   INTERVAL HISTORY:  I have reviewed her chart and materials related to her cancer extensively and collaborated history with the patient. Summary of oncologic history is as follows:    Oncology History  Non-small cell lung cancer, left ([place])  01/15/2021 Initial Diagnosis    Non-small cell lung cancer, left ([place])    02/12/2021 Cancer Staging    Staging form: Lung, AJCC 8th Edition - Clinical stage from 02/12/2021: Stage IA2 (cT1b, cN0, cM0) - Signed  by Sydney Kaplan, MD on 02/14/2021 Histopathologic type: Adenocarcinoma, NOS Stage prefix: Initial diagnosis Histologic grade (G): GX Histologic grading system: 4 grade system Laterality: Left Tumor size (mm): 19 Lymph-vascular invasion (LVI): LVI not present (absent)/not identified Diagnostic confirmation: Positive histology Specimen type: Bronchial Biopsy Staged by: Managing physician Stage used in treatment planning: Yes National guidelines used in treatment planning: Yes Type of national guideline used in treatment planning: NCCN Staging comments: Rec surgical resection    05/11/2021 Cancer Staging    Staging form: Lung, AJCC 8th Edition - Pathologic stage from 05/11/2021: Stage IIB (ypT1c, pN1, cM0) - Signed by Sydney Kaplan, MD on 11/12/2021 Histopathologic type: Adenocarcinoma, NOS Stage prefix: Post-therapy Histologic grade (G): G2 Histologic grading system: 4 grade system Residual tumor (R): R0 - None Laterality: Left Tumor size (mm): 25 Lymph-vascular invasion (LVI): LVI not present (absent)/not  identified Diagnostic confirmation: Positive histology PLUS positive immunophenotyping and/or positive genetic studies Specimen type: Excision Staged by: Managing physician Type of lung cancer: Surgically resected non-small cell lung cancer ECOG performance status: Grade 1 Perineural invasion (PNI): Absent Pleural/elastic layer invasion: PL0 Pleural lavage cytology: Unknown Weight loss: Absent Adequacy of mediastinal dissection: Adequate Radiotherapy dose: No Adjuvant radiation: No Adjuvant chemotherapy: Yes Stage used in treatment planning: Yes National guidelines used in treatment planning: Yes Type of national guideline used in treatment planning: NCCN Staging comments: Adjuvant pembrolizumab, PDL 1 90%    10/04/2021 -  Chemotherapy    Patient is on Treatment Plan : LUNG NSCLC Pembrolizumab Adj q21d x 18 cycles     Interval History   [facility] is here today  for a routine follow up prior to her 7th cycle of Pembrolizumab. Her potassium was 2.7 last visit, this is now 3.3. She was scheduled to get IV potassium but did not show or reschedule this and did not take her immunotherapy either. She states she has been taking her thyroid medication and potassium when she can, but she had increased in her diet. I have recommended she take 2 a day for her potassium supplement. She is not taking her medications and she says this is due to her living arrangement, she does state she may be renting a room but is waiting for more confirmation. She does note that she did smoke again but she is trying to stop, and this is also due to stress. She still has a cough. She denies fever, chills or other signs of infection.  She denies vomiting or abdominal pain.  She denies sore throat, or chest pain. Her weight is down 5 pounds since her last visit, her appetite is poor due to stress.    HISTORY:        Past Medical History:  Diagnosis Date   Allergy     Arthritis     Complication of anesthesia      slow to wake up   COPD (chronic obstructive pulmonary disease) (HCC)     COPD, mild ([place]) 12/15/2020   Family history of pancreatic cancer 11/28/2020   Family history of prostate cancer 11/28/2020   Family history of stomach cancer 11/28/2020   Genetic testing 12/18/2020    [employer] Multi-Cancer Panel was Negative. Report date is 12/12/2020.     The Multi-Cancer + RNA Panel offered by [employer] includes sequencing and/or deletion/duplication analysis of the following 84 genes:  AIP*, ALK, APC*, ATM*, AXIN2*, BAP1*, BARD1*, BLM*, BMPR1A*, BRCA1*, BRCA2*, BRIP1*, CASR, CDC73*, CDH1*, CDK4, CDKN1B*, CDKN1C*, CDKN2A, CEBPA, CHEK2*, CTNNA1*, DICER1*, DIS3L2*, EGFR, EPCAM, FH   GERD (gastroesophageal reflux disease)     Heartburn 12/27/2021   Hormone disorder 11/20/2020   Hypertension     Hypokalemia 12/27/2021   Leukocytosis 01/17/2022   Migraines     Nodule of left lung  11/21/2020   Non-small cell lung cancer, left ([place]) 01/15/2021   Tobacco use 12/07/2020           Past Surgical History:  Procedure Laterality Date   ABDOMINAL HYSTERECTOMY   1989    unsure of vaginal or abdominal   BRONCHIAL BIOPSY   01/15/2021    Procedure: BRONCHIAL BIOPSIES;  Surgeon: Collene Gobble, MD;  Location: [facility];  Service: Pulmonary;;   BRONCHIAL BRUSHINGS   01/15/2021    Procedure: BRONCHIAL BRUSHINGS;  Surgeon: Collene Gobble, MD;  Location: [facility];  Service: Pulmonary;;   BRONCHIAL NEEDLE ASPIRATION BIOPSY  01/15/2021    Procedure: BRONCHIAL NEEDLE ASPIRATION BIOPSIES;  Surgeon: Collene Gobble, MD;  Location: [facility] ENDOSCOPY;  Service: Pulmonary;;   COLONOSCOPY   2022   FIDUCIAL MARKER PLACEMENT   01/15/2021    Procedure: FIDUCIAL MARKER PLACEMENT;  Surgeon: Collene Gobble, MD;  Location: [facility]COPY;  Service: Pulmonary;;   ovaries removed   1986   surgery to remove scar tissue        from colon/bladder in patient's late 30's   [facility]        years ago in patient's late 12s   [place]   01/15/2021    Procedure: [place];  Surgeon: Collene Gobble, MD;  Location: [facility];  Service: Pulmonary;;           Family History  Problem Relation Age of Onset   Pancreatic cancer Maternal Aunt 60   Brain cancer Maternal Uncle     Stomach cancer Maternal Uncle          dx. >50   Prostate cancer Half-Brother          passed away at 35   Prostate cancer Half-Brother        Social History:  reports that she quit smoking about 10 months ago. Her smoking use included cigarettes. She started smoking about 61 years ago. She has a 29.50 pack-year smoking history. She has never used smokeless tobacco. She reports current alcohol use of about 4.0 standard drinks of alcohol per week. She reports current drug use. Frequency: 3.00 times  per week. Drug: Marijuana. She is widowed and lives at home.  She has quit smoking since April 12, when she had her surgical resection.  She has 2 biological children and 1 stepchild. She is disabled, and has been exposed to chemicals or other toxic agents. She was previously employed at a hosiery, chicken farm and cleaning houses. 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   Allergies:       Allergies  Allergen Reactions   Codeine Rash   Demerol [Meperidine Hcl] Other (See Comments)      coma   Other Dermatitis      Stainless steel / skin weeps   Silver Dermatitis      Sterling silver skin weeps   Gold Bond [Menthol-Zinc Oxide] Dermatitis      Skin weeps   Neosporin [Bacitracin-Polymyxin B] Dermatitis      Skin weeps   Wound Dressing Adhesive Other (See Comments)      CAUSES SKIN TEARS PER PATIENT. INSTEAD USE THE TEGADERM 54M WITH ADHESIVE FREE WINDOW OVER PORT.      Current Medications:       Current Outpatient Medications  Medication Sig Dispense Refill   albuterol (VENTOLIN HFA) 108 (90 Base) MCG/ACT inhaler Inhale 2 puffs into the lungs every 4 (four) hours as needed for wheezing or shortness of breath.       amitriptyline (ELAVIL) 10 MG tablet Take 10 mg by mouth at bedtime as needed for sleep.       Ascorbic Acid (VITAMIN C WITH ROSE HIPS) 500 MG tablet Take 500 mg by mouth daily.       aspirin 81 MG EC tablet Take 81 mg by mouth daily.       atorvastatin (LIPITOR) 40 MG tablet Take 40 mg by mouth daily.       b complex  vitamins capsule Take 1 capsule by mouth daily. (Patient not taking: Reported on 01/17/2022)       Biotin 5000 MCG CAPS Take 5,000 mcg by mouth daily.       Calcium-Vitamin D-Vitamin K (CHEWABLE CALCIUM PO) Take 650 mg by mouth daily.       celecoxib (CELEBREX) 200 MG capsule Take 200 mg by mouth daily.       fluconazole (DIFLUCAN) 100 MG tablet Take 1 tablet (100 mg total) by mouth daily. 10 tablet 0   gabapentin (NEURONTIN) 300 MG  capsule Take 300 mg by mouth at bedtime.       hydrochlorothiazide (HYDRODIURIL) 25 MG tablet Take 25 mg by mouth every morning.       HYDROcodone-acetaminophen (NORCO/VICODIN) 5-325 MG tablet Take 1 tablet by mouth every 6 (six) hours as needed for moderate pain or severe pain. 30 tablet 0   levothyroxine (SYNTHROID) 50 MCG tablet Take 1 tablet (50 mcg total) by mouth daily before breakfast. 30 tablet 2   lidocaine-prilocaine (EMLA) cream Apply 1 Application topically as needed. 30 g 0   omeprazole (PRILOSEC) 40 MG capsule Take 1 capsule (40 mg total) by mouth daily. 30 capsule 2   ondansetron (ZOFRAN) 4 MG tablet Take 1 tablet (4 mg total) by mouth every 4 (four) hours as needed for nausea. 90 tablet 3   potassium chloride SA (KLOR-CON M) 20 MEQ tablet Take 20 mEq by mouth daily.       prochlorperazine (COMPAZINE) 10 MG tablet Take 1 tablet (10 mg total) by mouth every 6 (six) hours as needed for nausea or vomiting. 30 tablet 2   vitamin E 180 MG (400 UNITS) capsule Take 400 Units by mouth daily. (Patient not taking: Reported on 01/17/2022)        No current facility-administered medications for this visit.      REVIEW OF SYSTEMS:  Review of Systems  Constitutional: Negative.  Negative for appetite change, chills, fatigue, fever and unexpected weight change.  HENT:  Negative.    Eyes: Negative.   Respiratory:  Negative for chest tightness, cough (occasionally productive with clear sputum), hemoptysis and shortness of breath. Wheezing: occasional.  Cardiovascular: Negative.  Negative for chest pain, leg swelling and palpitations.  Gastrointestinal: Negative.  Negative for abdominal distention, abdominal pain, blood in stool, constipation, diarrhea, nausea and vomiting.  Endocrine: Negative.   Genitourinary: Negative.  Negative for difficulty urinating, dysuria, frequency and hematuria.   Musculoskeletal: Negative.  Negative for arthralgias, back pain, flank pain, gait problem and myalgias.   Skin: Negative.   Neurological: Negative.  Negative for dizziness, extremity weakness, gait problem, headaches, light-headedness, numbness, seizures and speech difficulty.  Hematological: Negative.   Psychiatric/Behavioral: Negative.  Negative for depression and sleep disturbance. The patient is not nervous/anxious.        Dissociative identity disorder        VITALS:  Blood pressure 126/87, pulse 65, temperature 98 F (36.7 C), temperature source Oral, resp. rate 17, height 4\' 11"  (1.499 m), weight 132 lb 4.8 oz (60 kg), SpO2 99 %.     Wt Readings from Last 3 Encounters:  03/04/22 131 lb (59.4 kg)  02/28/22 132 lb 4.8 oz (60 kg)  02/07/22 137 lb 3.2 oz (62.2 kg)    Body mass index is 26.72 kg/m.   Performance status (ECOG): 1 - Symptomatic but completely ambulatory   PHYSICAL EXAM:  Physical Exam Constitutional:      General: She is not in acute distress.  Appearance: Normal appearance. She is normal weight.  HENT:     Head: Normocephalic and atraumatic.  Eyes:     General: No scleral icterus.    Extraocular Movements: Extraocular movements intact.     Conjunctiva/sclera: Conjunctivae normal.     Pupils: Pupils are equal, round, and reactive to light.  Cardiovascular:     Rate and Rhythm: Normal rate and regular rhythm.     Pulses: Normal pulses.     Heart sounds: Normal heart sounds. No murmur heard.    No friction rub. No gallop.  Pulmonary:     Effort: Pulmonary effort is normal. No respiratory distress.     Breath sounds: Normal breath sounds.  Abdominal:     General: Bowel sounds are normal. There is no distension.     Palpations: Abdomen is soft. There is no hepatomegaly, splenomegaly or mass.     Tenderness: There is no abdominal tenderness.  Musculoskeletal:        General: Normal range of motion.     Cervical back: Normal range of motion and neck supple.     Right lower leg: No edema.     Left lower leg: No edema.  Lymphadenopathy:     Cervical: No  cervical adenopathy.  Skin:    General: Skin is warm and dry.  Neurological:     General: No focal deficit present.     Mental Status: She is alert and oriented to person, place, and time. Mental status is at baseline.  Psychiatric:        Mood and Affect: Mood normal.        Behavior: Behavior normal.        Thought Content: Thought content normal.        Judgment: Judgment normal.          LABS:        Latest Ref Rng & Units 02/28/2022   12:00 AM 02/07/2022   11:29 AM 01/17/2022   12:00 AM  CBC  WBC   10.6     8.2  11.5      Hemoglobin 12.0 - 16.0 12.5     13.0  13.6      Hematocrit 36 - 46 36     38.6  40      Platelets 150 - 400 K/uL 307     267  318         This result is from an external source.        Latest Ref Rng & Units 02/28/2022   12:00 AM 02/07/2022   11:29 AM 01/17/2022    9:41 AM  CMP  Glucose 70 - 99 mg/dL   94  74   BUN 4 - 21 15     8  12    Creatinine 0.5 - 1.1 1.0     1.21  1.26   Sodium 137 - 147 141     136  141   Potassium 3.5 - 5.1 mEq/L 3.3     2.7  3.2   Chloride 99 - 108 102     96  103   CO2 13 - 22 29     30  29    Calcium 8.7 - 10.7 9.6     9.3  10.0   Total Protein 6.5 - 8.1 g/dL   7.8  8.1   Total Bilirubin 0.3 - 1.2 mg/dL   1.1  0.9   Alkaline Phos 25 - 125 69  56  56   AST 13 - 35 33     62  35   ALT 7 - 35 U/L 18     25  19       This result is from an external source.        Recent Labs       Lab Results  Component Value Date    CEA1 2.8 11/08/2021     /  Last Labs         CEA  Date Value Ref Range Status  11/08/2021 2.8 0.0 - 4.7 ng/mL Final      Comment:      (NOTE)                             Nonsmokers          <3.9                             Smokers             <5.6 Roche [employer] Electrochemiluminescence Immunoassay (ECLIA) Values obtained with different assay methods or kits cannot be used interchangeably.  Results cannot be interpreted as absolute evidence of the presence or absence of malignant  disease. Performed At: [facility] [place], [place] [id number] Rush Farmer MD SK:[id number]            STUDIES:  Imaging Results  No results found.        EXAM: 10/13/2020 CT CHEST WITHOUT CONTRAST LOW-DOSE FOR LUNG CANCER SCREENING    TECHNIQUE:  Multidetector CT imaging of the chest was performed following the  standard protocol without IV contrast.    COMPARISON: None.    FINDINGS:  Cardiovascular: Atherosclerotic calcification of the aorta. Heart  size normal. No pericardial effusion.  Mediastinum/Nodes: No pathologically enlarged mediastinal or  axillary lymph nodes. Hilar regions are difficult to definitively  evaluate without IV contrast but appear grossly unremarkable. There  may be distal esophageal wall thickening which can be seen with  gastroesophageal reflux.  Lungs/Pleura: Mild biapical pleuroparenchymal scarring.  Centrilobular and paraseptal emphysema. Smoking related respiratory  bronchiolitis. 11.2 mm peripheral left lower lobe nodule (4/178) has  somewhat lobulated borders. Other pulmonary nodules measure 5.3 mm  or less in size. No pleural fluid. Airway is unremarkable.  Upper Abdomen: Visualized portions of the liver, gallbladder,  adrenal glands, kidneys, spleen, pancreas, stomach and bowel are  grossly unremarkable.  Musculoskeletal: No worrisome lytic or sclerotic lesions.    IMPRESSION:  1. Lung-RADS 4A, suspicious. Follow up low-dose chest CT without  contrast in 3 months (please use the following order, "CT CHEST LCS  NODULE FOLLOW-UP W/O CM") is recommended. Alternatively, PET may be  considered when there is a solid component 32mm or larger. 11.2 mm  peripheral left lower lobe nodule. These results will be called to  the ordering clinician or representative by the Radiologist  Assistant, and communication documented in the PACS or [employer].  2. Aortic atherosclerosis (ICD10-I70.0).  3. Emphysema  (ICD10-J43.9).    EXAM: 11/01/2020 NUCLEAR MEDICINE PET SKULL BASE TO THIGH   TECHNIQUE: 6.51 mCi F-18 FDG was injected intravenously. Full-ring PET imaging was performed from the skull base to thigh after the radiotracer. CT data was obtained and used for attenuation correction and anatomic localization.   Fasting blood glucose: 103 mg/dl   COMPARISON:  Chest CT 10/13/2020  FINDINGS: Mediastinal blood pool activity: SUV max 2.13   Liver activity: SUV max NA   NECK: No hypermetabolic lymph nodes in the neck.   Incidental CT findings: none   CHEST: 12.5 mm lobulated left lower lobe subpleural pulmonary nodule is hypermetabolic with SUV max of 1.82 and consistent with primary lung neoplasm.   No enlarged or hypermetabolic mediastinal or hilar lymph nodes to suggest metastatic adenopathy. No supraclavicular or axillary adenopathy.   No other pulmonary lesions.   Incidental CT findings: Stable underlying emphysematous changes and stable vascular calcifications.   ABDOMEN/PELVIS: No abnormal hypermetabolic activity within the liver, pancreas, adrenal glands, or spleen. No hypermetabolic lymph nodes in the abdomen or pelvis.   Incidental CT findings: Scattered atherosclerotic calcifications involving the aorta and iliac arteries.   SKELETON: No significant bony findings. No evidence of metastatic disease.   Incidental CT findings: none   IMPRESSION: 1. Solitary 12.5 mm left lower lobe pulmonary nodule is hypermetabolic and consistent with small primary lung neoplasm. 2. No findings for mediastinal or hilar adenopathy or metastatic disease elsewhere.   FINAL MICROSCOPIC DIAGNOSIS: 01/15/2021  A. LUNG, LLL, BRUSH:  - Atypical cells present   B. LUNG, LLL, FINE NEEDLE ASPIRATION:  - Malignant cells consistent with adenocarcinoma, see comment   COMMENT:   B.   Immunohistochemical stains show that the tumor cells are positive  for TTF-1 while they are negative  for p63 and CK5/6, consistent with  above interpretation.  Dr. Saralyn Pilar reviewed the case and concurs with  the diagnosis.  Dr. Lamonte Sakai was notified on 01/19/2021.        I,Gabriella Ballesteros,acting as a scribe for Sydney Kaplan, MD.,have documented all relevant documentation on the behalf of Sydney Kaplan, MD,as directed by  Sydney Kaplan, MD while in the presence of Sydney Kaplan, MD.

## 2022-03-19 LAB — BASIC METABOLIC PANEL
BUN: 12 (ref 4–21)
CO2: 29 — AB (ref 13–22)
Chloride: 101 (ref 99–108)
Creatinine: 0.9 (ref 0.5–1.1)
Glucose: 100
Potassium: 3.2 mEq/L — AB (ref 3.5–5.1)
Sodium: 138 (ref 137–147)

## 2022-03-19 LAB — CBC AND DIFFERENTIAL
HCT: 37 (ref 36–46)
Hemoglobin: 12.7 (ref 12.0–16.0)
Neutrophils Absolute: 7.13
Platelets: 318 10*3/uL (ref 150–400)
WBC: 12.5

## 2022-03-19 LAB — CBC: RBC: 3.87 (ref 3.87–5.11)

## 2022-03-19 LAB — HEPATIC FUNCTION PANEL
ALT: 13 U/L (ref 7–35)
AST: 21 (ref 13–35)
Alkaline Phosphatase: 60 (ref 25–125)
Bilirubin, Total: 1.5

## 2022-03-19 LAB — COMPREHENSIVE METABOLIC PANEL
Albumin: 4.3 (ref 3.5–5.0)
Calcium: 9.7 (ref 8.7–10.7)

## 2022-03-21 ENCOUNTER — Encounter: Payer: Self-pay | Admitting: Oncology

## 2022-03-21 ENCOUNTER — Inpatient Hospital Stay (INDEPENDENT_AMBULATORY_CARE_PROVIDER_SITE_OTHER): Payer: Medicare HMO | Admitting: Oncology

## 2022-03-21 ENCOUNTER — Other Ambulatory Visit: Payer: Medicare HMO

## 2022-03-21 VITALS — BP 113/82 | HR 80 | Temp 97.9°F | Resp 16 | Ht 59.0 in | Wt 126.6 lb

## 2022-03-21 DIAGNOSIS — C3492 Malignant neoplasm of unspecified part of left bronchus or lung: Secondary | ICD-10-CM | POA: Diagnosis not present

## 2022-03-21 NOTE — Progress Notes (Signed)
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 This was initially found on lung cancer screening CT in September 2022. Most recent CT imaging revealed the solitary left lower lobe nodule to be enlarging, measuring 1.2 cm in September and now measuring 1.4 x 1.9 cm.  She initially met with Dr. Kipp Brood for consultation, and he recommended SBRT as he felt that her smoking would cause excessive risk for postoperative complications.  She sought another opinion with  Dr. Richard Miu in [place] who agreed to her surgical management. Her daughter lives in [place] and will be her support system.  She has now had her surgery on April 12 with left thoracotomy and left lower lobe lobectomy along with mediastinal lymph node dissection.  Pathology revealed an adenocarcinoma grade 2 measuring 2.5 cm in diameter but with no evidence of visceral pleural invasion or direct invasion of adjacent structures.  All margins are negative for invasive carcinoma.  She did have involvement of 2 out of 20 lymph nodes for a T1c N1 M0, stage IIB.  EGFR and ALK mutations were negative but PD-L1 was 90%.  Her doctor there recommended chemotherapy with immunotherapy but she refused chemotherapy.  She was only willing to take the immunotherapy for 1 year and had a port placed.  She had her first dose on September 7 and her second dose on September 28.  She has now relocated to [facility] and we have continued this regimen, and plan 1 year.     Tobacco abuse.  She has not smoked at all since April 12 when she had her surgery. "She fell off the wagon" recently, but has stopped again.    3.    Dissociative identity disorder with 3 personalities. She continues to follow with a psychiatrist.    4.     Hypokalemia.  Potassium was down to 2.7 last month and so she is on supplement now twice daily. We will now increase this to three times daily since it is only 3.2.     Plan: Her CT chest scan from 03/19/22 reveals small scattered bilateral pulmonary nodules are all stable, including persistent stable lingular nodules that were new on her October CT scan. There were no new or progressive metastatic disease in the chest, trace basilar left pleural effusion decreased,  and emphysema. We will continue with scans every 3 months. She will continue with treatment but is rescheduling for her Monday appointment, I will plan to see her back in 3 weeks with  CBC and CMP. She asked to have some time either before her grandson's graduation on May 18th or a week after to receive treatment.  We looked over the schedule and agreed we can postpone her May treatment to either the 23rd or 24th. Later we will need to arrange for annual mammography and colonoscopy when she is due, since she does have a history of colon polyps.  She understands and agrees with this plan of care. I have answered her questions and she knows to call with any concerns.      I provided 20 minutes of face-to-face time during this this encounter and > 50% was spent counseling as documented under my assessment and plan.  Derwood Kaplan, MD [facility] [address] [place] [address] Dept: [phone] Dept Fax: [phone]      CHIEF COMPLAINT:  CC: Adenocarcinoma of the left lung   Current Treatment:  Postoperative adjuvant immunotherapy     HISTORY OF PRESENT ILLNESS:  Sydney Hunt is a 62 y.o. female with a history of tobacco abuse who has adenocarcinoma of the left lung. This was originally found on lung cancer screening CT from September 2022. PET imaging was obtained in October which revealed a solitary 1.25 cm left lower lobe pulmonary nodule to be  hypermetabolic and consistent with small primary lung neoplasm. No findings for mediastinal or hilar adenopathy or metastatic disease elsewhere was observed. She met with the genetic counselor and [facility] diagnostic testing was negative for any clinically significant mutations. CT chest from December revealed enlarging left lower lobe nodule, measuring 1.4 x 1.9 cm. The additional smaller pulmonary nodules remained unchanged. She was referred to Dr. Baltazar Apo and underwent bronchoscopy on December 19th and cytology from this procedure confirmed malignant cells consistent with adenocarcioma. Immunohistochemical stains were positive for TTF-1, and negative for p63 and CK5/6. 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 Her FEV1 was 1.98, which is 88% of predicted and her FVC was 2.58, which is 89% of predicted. She was deemed a candidate for resection and met with Dr. Kipp Brood for consultation. As she is not motivated to quit smoking, he felt that surgery would present an excessive risk for postoperative respiratory complications. He recommended SBRT as a good option if the patient was unable to quit smoking.  The patient went for a second opinion to Dr. Asencion Gowda and surgical resection was carried out on April 12 with a left lower lobe lobectomy.  She was hospitalized for 13 days and has not smoked since that time.  Pathology revealed a 2.5 cm adenocarcinoma, grade 2.  There was no evidence of pleural invasion and margins are clear but she tells me 2 of 20 nodes were positive, for a T1c N1 M0, stage IIB.  A port was placed on August 16 and she was started on pembrolizumab on September 7 and had her second dose on September 28.   INTERVAL HISTORY:  I have reviewed her chart and materials related to her cancer extensively and collaborated history with the patient. Summary of oncologic history is as follows:    Oncology History  Non-small cell lung cancer, left ([facility])  01/15/2021  Initial Diagnosis    Non-small cell lung cancer, left ([place])    02/12/2021 Cancer Staging    Staging form: Lung, AJCC 8th Edition - Clinical stage from 02/12/2021: Stage IA2 (cT1b, cN0, cM0) - Signed by Derwood Kaplan, MD on 02/14/2021 Histopathologic type: Adenocarcinoma, NOS Stage prefix: Initial diagnosis Histologic grade (G): GX Histologic grading system: 4 grade system Laterality: Left Tumor size (mm): 19 Lymph-vascular invasion (LVI): LVI not present (absent)/not identified Diagnostic confirmation: Positive histology Specimen type: Bronchial Biopsy Staged by: Managing physician Stage used in treatment planning: Yes National guidelines used in treatment planning: Yes Type of national guideline used in treatment planning: NCCN Staging comments: Rec surgical resection    05/11/2021 Cancer Staging    Staging form: Lung, AJCC 8th Edition - Pathologic stage from 05/11/2021: Stage IIB (ypT1c, pN1, cM0) - Signed by Derwood Kaplan, MD on 11/12/2021 Histopathologic type: Adenocarcinoma, NOS Stage prefix: Post-therapy Histologic grade (G): G2 Histologic grading system: 4 grade system Residual  tumor (R): R0 - None Laterality: Left Tumor size (mm): 25 Lymph-vascular invasion (LVI): LVI not present (absent)/not identified Diagnostic confirmation: Positive histology PLUS positive immunophenotyping and/or positive genetic studies Specimen type: Excision Staged by: Managing physician Type of lung cancer: Surgically resected non-small cell lung cancer ECOG performance status: Grade 1 Perineural invasion (PNI): Absent Pleural/elastic layer invasion: PL0 Pleural lavage cytology: Unknown Weight loss: Absent Adequacy of mediastinal dissection: Adequate Radiotherapy dose: No Adjuvant radiation: No Adjuvant chemotherapy: Yes Stage used in treatment planning: Yes National guidelines used in treatment planning: Yes Type of national guideline used in treatment planning: NCCN Staging  comments: Adjuvant pembrolizumab, PDL 1 90%    10/04/2021 -  Chemotherapy    Patient is on Treatment Plan : LUNG NSCLC Pembrolizumab Adj q21d x 18 cycles     Interval History   Sydney Hunt is here today for a routine follow up prior to her 8th cycle of Pembrolizumab along with her recent CT chest scans. Her CT chest scan from 03/19/22 reveals small scattered bilateral pulmonary nodules are all stable, including persistent stable lingular nodules that were new on her October CT scan. There were no new or progressive metastatic disease in the chest, trace basilar left pleural effusion decreased,  and emphysema. She does note that she has been more tired after her treatment. She notes she has a wet cough, coughing up clear phlegm. She also notes that she has been having nausea but can be related to stress. She asked to have some time either before her grandsons graduation on May 18th or a week after to receive treatment, we looked over the schedule and agreed we can postpone it to either the 23rd or 24th. She denies fever, chills or other signs of infection.  She denies vomiting or abdominal pain.  She denies sore throat, or chest pain. Her weight is down 5 pounds since her last visit, her appetite is fair, better than before.     HISTORY:        Past Medical History:  Diagnosis Date   Allergy     Arthritis     Complication of anesthesia      slow to wake up   COPD (chronic obstructive pulmonary disease) (HCC)     COPD, mild ([facility]) 12/15/2020   Family history of pancreatic cancer 11/28/2020   Family history of prostate cancer 11/28/2020   Family history of stomach cancer 11/28/2020   Genetic testing 12/18/2020    [facility] Multi-Cancer Panel was Negative. Report date is 12/12/2020.     The Multi-Cancer + RNA Panel offered by [facility] includes sequencing and/or deletion/duplication analysis of the following 84 genes:  AIP*, ALK, APC*, ATM*, AXIN2*, BAP1*, BARD1*, BLM*, BMPR1A*, BRCA1*, BRCA2*, BRIP1*, CASR,  CDC73*, CDH1*, CDK4, CDKN1B*, CDKN1C*, CDKN2A, CEBPA, CHEK2*, CTNNA1*, DICER1*, DIS3L2*, EGFR, EPCAM, FH   GERD (gastroesophageal reflux disease)     Heartburn 12/27/2021   Hormone disorder 11/20/2020   Hypertension     Hypokalemia 12/27/2021   Leukocytosis 01/17/2022   Migraines     Nodule of left lung 11/21/2020   Non-small cell lung cancer, left ([place]) 01/15/2021   Tobacco use 12/07/2020           Past Surgical History:  Procedure Laterality Date   ABDOMINAL HYSTERECTOMY   1989    unsure of vaginal or abdominal   BRONCHIAL BIOPSY   01/15/2021    Procedure: BRONCHIAL BIOPSIES;  Surgeon: Collene Gobble, MD;  Location: [facility] ENDOSCOPY;  Service: Pulmonary;;   BRONCHIAL BRUSHINGS  01/15/2021    Procedure: BRONCHIAL BRUSHINGS;  Surgeon: Collene Gobble, MD;  Location: [facility] END[facility]COPY;  Service: Pulmonary;;   BRONCHIAL NEEDLE ASPIRATION BIOPSY   01/15/2021    Procedure: BRONCHIAL NEEDLE ASPIRATION BIOPSIES;  Surgeon: Collene Gobble, MD;  Location: [facility] ENDOSCOPY;  Service: Pulmonary;;   COLONOSCOPY   2022   FIDUCIAL MARKER PLACEMENT   01/15/2021    Procedure: FIDUCIAL MARKER PLACEMENT;  Surgeon: Collene Gobble, MD;  Location: [facility] ENDOSCOPY;  Service: Pulmonary;;   ovaries removed   1986   surgery to remove scar tissue        from colon/bladder in patient's late 30's   TUBAL LIGATION   1986   UPPER GI ENDOSCOPY        years ago in patient's late 96s   [place]   01/15/2021    Procedure: [place];  Surgeon: Collene Gobble, MD;  Location: [facility] ENDOSCOPY;  Service: Pulmonary;;           Family History  Problem Relation Age of Onset   Pancreatic cancer Maternal Aunt 80   Brain cancer Maternal Uncle     Stomach cancer Maternal Uncle          dx. >50   Prostate cancer Half-Brother          passed away at 12   Prostate cancer Half-Brother        Social History:  reports that she quit smoking  about 10 months ago. Her smoking use included cigarettes. She started smoking about 61 years ago. She has a 29.50 pack-year smoking history. She has never used smokeless tobacco. She reports current alcohol use of about 4.0 standard drinks of alcohol per week. She reports current drug use. Frequency: 3.00 times per week. Drug: Marijuana. She is widowed and lives at home.  She has quit smoking since April 12, when she had her surgical resection.  She has 2 biological children and 1 stepchild. She is disabled, and has been exposed to chemicals or other toxic agents. She was previously employed at a hosiery, chicken farm and cleaning houses. 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   Allergies:       Allergies  Allergen Reactions   Codeine Rash   Demerol [Meperidine Hcl] Other (See Comments)      coma   Other Dermatitis      Stainless steel / skin weeps   Silver Dermatitis      Sterling silver skin weeps   Gold Bond [Menthol-Zinc Oxide] Dermatitis      Skin weeps   Neosporin [Bacitracin-Polymyxin B] Dermatitis      Skin weeps   Wound Dressing Adhesive Other (See Comments)      CAUSES SKIN TEARS PER PATIENT. INSTEAD USE THE TEGADERM 23M WITH ADHESIVE FREE WINDOW OVER PORT.      Current Medications:       Current Outpatient Medications  Medication Sig Dispense Refill   albuterol (VENTOLIN HFA) 108 (90 Base) MCG/ACT inhaler Inhale 2 puffs into the lungs every 4 (four) hours as needed for wheezing or shortness of breath.       amitriptyline (ELAVIL) 10 MG tablet Take 10 mg by mouth at bedtime as needed for sleep.       Ascorbic Acid (VITAMIN C WITH ROSE HIPS) 500 MG tablet Take 500 mg by mouth daily.       aspirin 81 MG EC tablet Take 81 mg  by mouth daily.       atorvastatin (LIPITOR) 40 MG tablet Take 40 mg by mouth daily.       b complex vitamins capsule Take 1 capsule by mouth daily. (Patient not taking: Reported on 01/17/2022)       Biotin 5000 MCG CAPS Take  5,000 mcg by mouth daily.       Calcium-Vitamin D-Vitamin K (CHEWABLE CALCIUM PO) Take 650 mg by mouth daily.       celecoxib (CELEBREX) 200 MG capsule Take 200 mg by mouth daily.       fluconazole (DIFLUCAN) 100 MG tablet Take 1 tablet (100 mg total) by mouth daily. 10 tablet 0   gabapentin (NEURONTIN) 300 MG capsule Take 300 mg by mouth at bedtime.       hydrochlorothiazide (HYDRODIURIL) 25 MG tablet Take 25 mg by mouth every morning.       HYDROcodone-acetaminophen (NORCO/VICODIN) 5-325 MG tablet Take 1 tablet by mouth every 6 (six) hours as needed for moderate pain or severe pain. 30 tablet 0   levothyroxine (SYNTHROID) 50 MCG tablet Take 1 tablet (50 mcg total) by mouth daily before breakfast. 30 tablet 2   lidocaine-prilocaine (EMLA) cream Apply 1 Application topically as needed. 30 g 0   omeprazole (PRILOSEC) 40 MG capsule Take 1 capsule (40 mg total) by mouth daily. 30 capsule 2   ondansetron (ZOFRAN) 4 MG tablet Take 1 tablet (4 mg total) by mouth every 4 (four) hours as needed for nausea. 90 tablet 3   potassium chloride SA (KLOR-CON M) 20 MEQ tablet Take 20 mEq by mouth daily.       prochlorperazine (COMPAZINE) 10 MG tablet Take 1 tablet (10 mg total) by mouth every 6 (six) hours as needed for nausea or vomiting. 30 tablet 2   vitamin E 180 MG (400 UNITS) capsule Take 400 Units by mouth daily. (Patient not taking: Reported on 01/17/2022)        No current facility-administered medications for this visit.      REVIEW OF SYSTEMS:  Review of Systems  Constitutional: Negative.  Negative for appetite change, chills, fatigue, fever and unexpected weight change.  HENT:  Negative.    Eyes: Negative.   Respiratory:  Negative for chest tightness, cough (occasionally productive with clear sputum), hemoptysis and shortness of breath. Wheezing: occasional.  Cardiovascular: Negative.  Negative for chest pain, leg swelling and palpitations.  Gastrointestinal: Negative.  Negative for abdominal  distention, abdominal pain, blood in stool, constipation, diarrhea, nausea and vomiting.  Endocrine: Negative.   Genitourinary: Negative.  Negative for difficulty urinating, dysuria, frequency and hematuria.   Musculoskeletal: Negative.  Negative for arthralgias, back pain, flank pain, gait problem and myalgias.  Skin: Negative.   Neurological: Negative.  Negative for dizziness, extremity weakness, gait problem, headaches, light-headedness, numbness, seizures and speech difficulty.  Hematological: Negative.   Psychiatric/Behavioral: Negative.  Negative for depression and sleep disturbance. The patient is not nervous/anxious.        Dissociative identity disorder        VITALS:  Blood pressure 126/87, pulse 65, temperature 98 F (36.7 C), temperature source Oral, resp. rate 17, height '4\' 11"'$  (1.499 m), weight 132 lb 4.8 oz (60 kg), SpO2 99 %.     Wt Readings from Last 3 Encounters:  03/04/22 131 lb (59.4 kg)  02/28/22 132 lb 4.8 oz (60 kg)  02/07/22 137 lb 3.2 oz (62.2 kg)    Body mass index is 26.72 kg/m.   Performance status (ECOG):  1 - Symptomatic but completely ambulatory   PHYSICAL EXAM:  Physical Exam Constitutional:      General: She is not in acute distress.    Appearance: Normal appearance. She is normal weight.  HENT:     Head: Normocephalic and atraumatic.  Eyes:     General: No scleral icterus.    Extraocular Movements: Extraocular movements intact.     Conjunctiva/sclera: Conjunctivae normal.     Pupils: Pupils are equal, round, and reactive to light.  Cardiovascular:     Rate and Rhythm: Normal rate and regular rhythm.     Pulses: Normal pulses.     Heart sounds: Normal heart sounds. No murmur heard.    No friction rub. No gallop.  Pulmonary:     Effort: Pulmonary effort is normal. No respiratory distress.     Breath sounds: Normal breath sounds.  Abdominal:     General: Bowel sounds are normal. There is no distension.     Palpations: Abdomen is soft.  There is no hepatomegaly, splenomegaly or mass.     Tenderness: There is no abdominal tenderness.  Musculoskeletal:        General: Normal range of motion.     Cervical back: Normal range of motion and neck supple.     Right lower leg: No edema.     Left lower leg: No edema.  Lymphadenopathy:     Cervical: No cervical adenopathy.  Skin:    General: Skin is warm and dry.  Neurological:     General: No focal deficit present.     Mental Status: She is alert and oriented to person, place, and time. Mental status is at baseline.  Psychiatric:        Mood and Affect: Mood normal.        Behavior: Behavior normal.        Thought Content: Thought content normal.        Judgment: Judgment normal.          LABS:        Latest Ref Rng & Units 02/28/2022   12:00 AM 02/07/2022   11:29 AM 01/17/2022   12:00 AM  CBC  WBC   10.6     8.2  11.5      Hemoglobin 12.0 - 16.0 12.5     13.0  13.6      Hematocrit 36 - 46 36     38.6  40      Platelets 150 - 400 K/uL 307     267  318         This result is from an external source.        Latest Ref Rng & Units 02/28/2022   12:00 AM 02/07/2022   11:29 AM 01/17/2022    9:41 AM  CMP  Glucose 70 - 99 mg/dL   94  74   BUN 4 - '21 15     8  12   '$ Creatinine 0.5 - 1.1 1.0     1.21  1.26   Sodium 137 - 147 141     136  141   Potassium 3.5 - 5.1 mEq/L 3.3     2.7  3.2   Chloride 99 - 108 102     96  103   CO2 13 - '22 29     30  29   '$ Calcium 8.7 - 10.7 9.6     9.3  10.0   Total Protein 6.5 - 8.1 g/dL  7.8  8.1   Total Bilirubin 0.3 - 1.2 mg/dL   1.1  0.9   Alkaline Phos 25 - 125 69     56  56   AST 13 - 35 33     62  35   ALT 7 - 35 U/L '18     25  19      '$ This result is from an external source.        Recent Labs       Lab Results  Component Value Date    CEA1 2.8 11/08/2021     /  Last Labs         CEA  Date Value Ref Range Status  11/08/2021 2.8 0.0 - 4.7 ng/mL Final      Comment:      (NOTE)                             Nonsmokers           <3.9                             Smokers             <5.6 Roche [employer] Electrochemiluminescence Immunoassay (ECLIA) Values obtained with different assay methods or kits cannot be used interchangeably.  Results cannot be interpreted as absolute evidence of the presence or absence of malignant disease. Performed At: [facility] [place], [place] [id number] Rush Farmer MD [id number]            STUDIES:  Imaging Results  No results found.  EXAM:2/202/24 CT CHEST WITH CONTRAST IMPRESSION Numerous small scattered bilateral pulmonary nodules are all stable, including persistent stable lingular nodules that were new on her October Ct=T scan, which may indicate pulmonary metastases.  No new or progressive metastatic disease in the chest Trace basilar left pleural effusion decreased.  Aortic Atherosclerosis and Emphysema.     EXAM: 10/13/2020 CT CHEST WITHOUT CONTRAST LOW-DOSE FOR LUNG CANCER SCREENING    TECHNIQUE:  Multidetector CT imaging of the chest was performed following the  standard protocol without IV contrast.    COMPARISON: None.    FINDINGS:  Cardiovascular: Atherosclerotic calcification of the aorta. Heart  size normal. No pericardial effusion.  Mediastinum/Nodes: No pathologically enlarged mediastinal or  axillary lymph nodes. Hilar regions are difficult to definitively  evaluate without IV contrast but appear grossly unremarkable. There  may be distal esophageal wall thickening which can be seen with  gastroesophageal reflux.  Lungs/Pleura: Mild biapical pleuroparenchymal scarring.  Centrilobular and paraseptal emphysema. Smoking related respiratory  bronchiolitis. 11.2 mm peripheral left lower lobe nodule (4/178) has  somewhat lobulated borders. Other pulmonary nodules measure 5.3 mm  or less in size. No pleural fluid. Airway is unremarkable.  Upper Abdomen: Visualized portions of the liver, gallbladder,  adrenal  glands, kidneys, spleen, pancreas, stomach and bowel are  grossly unremarkable.  Musculoskeletal: No worrisome lytic or sclerotic lesions.    IMPRESSION:  1. Lung-RADS 4A, suspicious. Follow up low-dose chest CT without  contrast in 3 months (please use the following order, "CT CHEST LCS  NODULE FOLLOW-UP W/O CM") is recommended. Alternatively, PET may be  considered when there is a solid component 69m or larger. 11.2 mm  peripheral left lower lobe nodule. These results will be called to  the ordering clinician or representative by the Radiologist  Assistant, and  communication documented in the PACS or [employer].  2. Aortic atherosclerosis (ICD10-I70.0).  3. Emphysema (ICD10-J43.9).    EXAM: 11/01/2020 NUCLEAR MEDICINE PET SKULL BASE TO THIGH   TECHNIQUE: 6.51 mCi F-18 FDG was injected intravenously. Full-ring PET imaging was performed from the skull base to thigh after the radiotracer. CT data was obtained and used for attenuation correction and anatomic localization.   Fasting blood glucose: 103 mg/dl   COMPARISON:  Chest CT 10/13/2020   FINDINGS: Mediastinal blood pool activity: SUV max 2.13   Liver activity: SUV max NA   NECK: No hypermetabolic lymph nodes in the neck.   Incidental CT findings: none   CHEST: 12.5 mm lobulated left lower lobe subpleural pulmonary nodule is hypermetabolic with SUV max of [id number] and consistent with primary lung neoplasm.   No enlarged or hypermetabolic mediastinal or hilar lymph nodes to suggest metastatic adenopathy. No supraclavicular or axillary adenopathy.   No other pulmonary lesions.   Incidental CT findings: Stable underlying emphysematous changes and stable vascular calcifications.   ABDOMEN/PELVIS: No abnormal hypermetabolic activity within the liver, pancreas, adrenal glands, or spleen. No hypermetabolic lymph nodes in the abdomen or pelvis.   Incidental CT findings: Scattered atherosclerotic  calcifications involving the aorta and iliac arteries.   SKELETON: No significant bony findings. No evidence of metastatic disease.   Incidental CT findings: none   IMPRESSION: 1. Solitary 12.5 mm left lower lobe pulmonary nodule is hypermetabolic and consistent with small primary lung neoplasm. 2. No findings for mediastinal or hilar adenopathy or metastatic disease elsewhere.   FINAL MICROSCOPIC DIAGNOSIS: 01/15/2021  A. LUNG, LLL, BRUSH:  - Atypical cells present   B. LUNG, LLL, FINE NEEDLE ASPIRATION:  - Malignant cells consistent with adenocarcinoma, see comment   COMMENT:   B.   Immunohistochemical stains show that the tumor cells are positive  for TTF-1 while they are negative for p63 and CK5/6, consistent with  above interpretation.  Dr. Saralyn Pilar reviewed the case and concurs with  the diagnosis.  Dr. Lamonte Sakai was notified on 01/19/2021.        I,Gabriella Ballesteros,acting as a scribe for Derwood Kaplan, MD.,have documented all relevant documentation on the behalf of Derwood Kaplan, MD,as directed by  Derwood Kaplan, MD while in the presence of Derwood Kaplan, MD.

## 2022-03-22 ENCOUNTER — Other Ambulatory Visit: Payer: Self-pay | Admitting: Pharmacist

## 2022-03-22 ENCOUNTER — Other Ambulatory Visit: Payer: Self-pay

## 2022-03-22 MED FILL — Pembrolizumab IV Soln 100 MG/4ML (25 MG/ML): INTRAVENOUS | Qty: 8 | Status: AC

## 2022-03-25 ENCOUNTER — Ambulatory Visit: Payer: Medicare HMO

## 2022-03-25 ENCOUNTER — Inpatient Hospital Stay: Payer: Medicare HMO

## 2022-03-25 VITALS — BP 115/56 | HR 76 | Temp 97.4°F | Resp 16 | Ht 59.0 in | Wt 127.0 lb

## 2022-03-25 DIAGNOSIS — C3492 Malignant neoplasm of unspecified part of left bronchus or lung: Secondary | ICD-10-CM

## 2022-03-25 DIAGNOSIS — Z5112 Encounter for antineoplastic immunotherapy: Secondary | ICD-10-CM | POA: Diagnosis not present

## 2022-03-25 MED ORDER — SODIUM CHLORIDE 0.9 % IV SOLN: Freq: Once | INTRAVENOUS | Status: AC

## 2022-03-25 MED ORDER — SODIUM CHLORIDE 0.9% FLUSH
10.0000 mL | INTRAVENOUS | Status: DC | PRN
  Administered 2022-03-25: 10 mL

## 2022-03-25 MED ORDER — POTASSIUM CHLORIDE 10 MEQ/100ML IV SOLN: 10.0000 meq | Freq: Once | INTRAVENOUS | Status: DC

## 2022-03-25 MED ORDER — HEPARIN SOD (PORK) LOCK FLUSH 100 UNIT/ML IV SOLN
500.0000 [IU] | Freq: Once | INTRAVENOUS | Status: AC | PRN
  Administered 2022-03-25: 500 [IU]

## 2022-03-25 MED ORDER — SODIUM CHLORIDE 0.9 % IV SOLN
200.0000 mg | Freq: Once | INTRAVENOUS | Status: AC
  Administered 2022-03-25: 200 mg via INTRAVENOUS
  Filled 2022-03-25: qty 8

## 2022-03-25 NOTE — Patient Instructions (Signed)
[facility] [place]  Discharge Instructions: Thank you for choosing [facility] to provide your oncology and hematology care.  If you have a lab appointment with the Cancer Center, please go directly to the Cancer Center and check in at the registration area.   Wear comfortable clothing and clothing appropriate for easy access to any Portacath or PICC line.   We strive to give you quality time with your provider. You may need to reschedule your appointment if you arrive late (15 or more minutes).  Arriving late affects you and other patients whose appointments are after yours.  Also, if you miss three or more appointments without notifying the office, you may be dismissed from the clinic at the provider's discretion.      For prescription refill requests, have your pharmacy contact our office and allow 72 hours for refills to be completed.    Today you received the following chemotherapy and/or immunotherapy agents Keytruda      To help prevent nausea and vomiting after your treatment, we encourage you to take your nausea medication as directed.  BELOW ARE SYMPTOMS THAT SHOULD BE REPORTED IMMEDIATELY: *FEVER GREATER THAN 100.4 F (38 C) OR HIGHER *CHILLS OR SWEATING *NAUSEA AND VOMITING THAT IS NOT CONTROLLED WITH YOUR NAUSEA MEDICATION *UNUSUAL SHORTNESS OF BREATH *UNUSUAL BRUISING OR BLEEDING *URINARY PROBLEMS (pain or burning when urinating, or frequent urination) *BOWEL PROBLEMS (unusual diarrhea, constipation, pain near the anus) TENDERNESS IN MOUTH AND THROAT WITH OR WITHOUT PRESENCE OF ULCERS (sore throat, sores in mouth, or a toothache) UNUSUAL RASH, SWELLING OR PAIN  UNUSUAL VAGINAL DISCHARGE OR ITCHING   Items with * indicate a potential emergency and should be followed up as soon as possible or go to the Emergency Department if any problems should occur.  Please show the CHEMOTHERAPY ALERT CARD or IMMUNOTHERAPY ALERT CARD at check-in to the  Emergency Department and triage nurse.  Should you have questions after your visit or need to cancel or reschedule your appointment, please contact [facility] [place]  Dept: [phone]  and follow the prompts.  Office hours are 8:00 a.m. to 4:30 p.m. Monday - Friday. Please note that voicemails left after 4:00 p.m. may not be returned until the following business day.  We are closed weekends and major holidays. You have access to a nurse at all times for urgent questions. Please call the main number to the clinic Dept: [phone] and follow the prompts.  For any non-urgent questions, you may also contact your provider using MyChart. We now offer e-Visits for anyone 18 and older to request care online for non-urgent symptoms. For details visit [email or web address].   Also download the MyChart app! Go to the app store, search "MyChart", open the app, select [facility], and log in with your MyChart username and password.   

## 2022-03-26 ENCOUNTER — Encounter: Payer: Self-pay | Admitting: Oncology

## 2022-03-26 ENCOUNTER — Telehealth: Payer: Self-pay

## 2022-03-26 NOTE — Telephone Encounter (Signed)
-----   Message from Derwood Kaplan, MD sent at 03/21/2022  5:20 PM EST ----- Regarding: med I told her to increase her K to tid

## 2022-03-27 ENCOUNTER — Other Ambulatory Visit: Payer: Self-pay | Admitting: Hematology and Oncology

## 2022-04-03 ENCOUNTER — Encounter: Payer: Self-pay | Admitting: Oncology

## 2022-04-10 ENCOUNTER — Other Ambulatory Visit: Payer: Self-pay | Admitting: Hematology and Oncology

## 2022-04-10 DIAGNOSIS — E032 Hypothyroidism due to medicaments and other exogenous substances: Secondary | ICD-10-CM

## 2022-04-10 DIAGNOSIS — Z2989 Encounter for other specified prophylactic measures: Secondary | ICD-10-CM

## 2022-04-10 NOTE — Telephone Encounter (Signed)
May be changing dose

## 2022-04-11 ENCOUNTER — Inpatient Hospital Stay: Payer: Medicare HMO | Attending: Hematology and Oncology | Admitting: Oncology

## 2022-04-11 ENCOUNTER — Inpatient Hospital Stay: Payer: Medicare HMO

## 2022-04-11 ENCOUNTER — Encounter: Payer: Self-pay | Admitting: Oncology

## 2022-04-11 VITALS — BP 123/76 | HR 75 | Temp 99.0°F | Resp 20 | Ht 59.0 in | Wt 125.0 lb

## 2022-04-11 DIAGNOSIS — Z8042 Family history of malignant neoplasm of prostate: Secondary | ICD-10-CM | POA: Insufficient documentation

## 2022-04-11 DIAGNOSIS — C3492 Malignant neoplasm of unspecified part of left bronchus or lung: Secondary | ICD-10-CM

## 2022-04-11 DIAGNOSIS — Z79899 Other long term (current) drug therapy: Secondary | ICD-10-CM | POA: Insufficient documentation

## 2022-04-11 DIAGNOSIS — E876 Hypokalemia: Secondary | ICD-10-CM | POA: Diagnosis not present

## 2022-04-11 DIAGNOSIS — Z79620 Long term (current) use of immunosuppressive biologic: Secondary | ICD-10-CM | POA: Diagnosis not present

## 2022-04-11 DIAGNOSIS — Z5112 Encounter for antineoplastic immunotherapy: Secondary | ICD-10-CM | POA: Insufficient documentation

## 2022-04-11 DIAGNOSIS — Z800 Family history of malignant neoplasm of digestive organs: Secondary | ICD-10-CM | POA: Insufficient documentation

## 2022-04-11 DIAGNOSIS — C3432 Malignant neoplasm of lower lobe, left bronchus or lung: Secondary | ICD-10-CM | POA: Insufficient documentation

## 2022-04-11 LAB — CMP (CANCER CENTER ONLY)
ALT: 13 U/L (ref 0–44)
AST: 22 U/L (ref 15–41)
Albumin: 4.4 g/dL (ref 3.5–5.0)
Alkaline Phosphatase: 66 U/L (ref 38–126)
Anion gap: 10 (ref 5–15)
BUN: 8 mg/dL (ref 8–23)
CO2: 32 mmol/L (ref 22–32)
Calcium: 9.5 mg/dL (ref 8.9–10.3)
Chloride: 98 mmol/L (ref 98–111)
Creatinine: 1.06 mg/dL — ABNORMAL HIGH (ref 0.44–1.00)
GFR, Estimated: 59 mL/min — ABNORMAL LOW (ref >60)
Glucose, Bld: 91 mg/dL (ref 70–99)
Potassium: 2.8 mmol/L — ABNORMAL LOW (ref 3.5–5.1)
Sodium: 140 mmol/L (ref 135–145)
Total Bilirubin: 1.4 mg/dL — ABNORMAL HIGH (ref 0.3–1.2)
Total Protein: 7.8 g/dL (ref 6.5–8.1)

## 2022-04-11 LAB — CBC AND DIFFERENTIAL
HCT: 39 (ref 36–46)
Hemoglobin: 13.7 (ref 12.0–16.0)
Neutrophils Absolute: 6.9
Platelets: 322 10*3/uL (ref 150–400)
WBC: 11.5

## 2022-04-11 LAB — TSH: TSH: 16.977 u[IU]/mL — ABNORMAL HIGH (ref 0.350–4.500)

## 2022-04-11 LAB — CBC: RBC: 4.08 (ref 3.87–5.11)

## 2022-04-11 LAB — CBC W DIFFERENTIAL (~~LOC~~ CC SCANNED REPORT)

## 2022-04-11 NOTE — Progress Notes (Signed)
[facility]  [address] 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 This was initially found on lung cancer screening CT in September 2022. Most recent CT imaging revealed the solitary left lower lobe nodule to be enlarging, measuring 1.2 cm in September and now measuring 1.4 x 1.9 cm.  She initially met with Dr. Cliffton Asters for consultation, and he recommended SBRT as he felt that her smoking would cause excessive risk for postoperative complications.  She sought another opinion with  Dr. Maryan Rued in [place] who agreed to her surgical management. Her daughter lives in [place] and will be her support system.  She has now had her surgery on April 12 with left thoracotomy and left lower lobe lobectomy along with mediastinal lymph node dissection.  Pathology revealed an adenocarcinoma grade 2 measuring 2.5 cm in diameter but with no evidence of visceral pleural invasion or direct invasion of adjacent structures.  All margins are negative for invasive carcinoma.  She did have involvement of 2 out of 20 lymph nodes for a T1c N1 M0, stage IIB.  EGFR and ALK mutations were negative but PD-L1 was 90%.  Her doctor there recommended chemotherapy with immunotherapy but she refused chemotherapy.  She was only willing to take the immunotherapy for 1 year and had a port placed.  She had her first dose on September 7 and her second dose on September 28.  She has now relocated to [facility] and we have continued this regimen, and plan 1 year of treatment.     Tobacco abuse.  She has not smoked at all since April 12 when she had her surgery. "She fell off the wagon" recently, but has stopped again.    3.    Dissociative identity disorder with 3 personalities. She continues to follow with a psychiatrist.     4.    Hypokalemia.  Potassium was down to 2.7 last month and so she is on supplement now twice daily. We will now increase this to three times daily since it is only 3.2.     Plan: She will continue with treatment , I will plan to see her back in 3 weeks with  CBC and CMP. She asked to have some time either before her grandson's graduation on May 18th or a week after to receive treatment.  We looked over the schedule and agreed we can postpone her May treatment to either the 23rd or 24th. Later we will need to arrange for annual mammography and colonoscopy when she is due, since she does have a history of colon polyps.  She understands and agrees with this plan of care. I have answered her questions and she knows to call with any concerns.      I provided 20 minutes of face-to-face time during this this encounter and > 50% was spent counseling as documented under my assessment and plan.    ADDENDUM: Her potassium is even lower at 2.8 and her compliance is in doubt.  I have recommended that she increase her dose to 6 daily, i.e. 3 twice daily and would even recommend IV potassium but she has refused that before.  The thyroid looks good and so she will stay on the same dose.  I notified her of these  abnormalities.     Dellia Beckwith, MD [facility] [address] [place] [address] Dept: [phone] Dept Fax: [phone]      CHIEF COMPLAINT:  CC: Adenocarcinoma of the left lung   Current Treatment:  Postoperative adjuvant immunotherapy     HISTORY OF PRESENT ILLNESS:  Sydney Hunt is a 62 y.o. female with a history of tobacco abuse who has adenocarcinoma of the left lung. This was originally found on lung cancer screening CT from September 2022. PET imaging was obtained in October which revealed a solitary 1.25 cm left lower lobe pulmonary nodule to be hypermetabolic and consistent with small primary lung  neoplasm. No findings for mediastinal or hilar adenopathy or metastatic disease elsewhere was observed. She met with the genetic counselor and [employer] diagnostic testing was negative for any clinically significant mutations. CT chest from December revealed enlarging left lower lobe nodule, measuring 1.4 x 1.9 cm. The additional smaller pulmonary nodules remained unchanged. She was referred to Dr. 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 Immunohistochemical stains were positive for TTF-1, and negative for p63 and CK5/6. 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 Her FEV1 was 1.98, which is 88% of predicted and her FVC was 2.58, which is 89% of predicted. She was deemed a candidate for resection and met with Dr. Cliffton Asters for consultation. As she is not motivated to quit smoking, he felt that surgery would present an excessive risk for postoperative respiratory complications. He recommended SBRT as a good option if the patient was unable to quit smoking.  The patient went for a second opinion to Dr. Lucienne Minks and surgical resection was carried out on April 12 with a left lower lobe lobectomy.  She was hospitalized for 13 days and has not smoked since that time.  Pathology revealed a 2.5 cm adenocarcinoma, grade 2.  There was no evidence of pleural invasion and margins are clear but she tells me 2 of 20 nodes were positive, for a T1c N1 M0, stage IIB.  A port was placed on August 16 and she was started on pembrolizumab on September 7 and had her second dose on September 28.   INTERVAL HISTORY:  I have reviewed her chart and materials related to her cancer extensively and collaborated history with the patient. Summary of oncologic history is as follows:    Oncology History  Non-small cell lung cancer, left (HCC)  01/15/2021 Initial Diagnosis    Non-small cell lung cancer, left  (HCC)    02/12/2021 Cancer Staging    Staging form: Lung, AJCC 8th Edition - Clinical stage from 02/12/2021: Stage IA2 (cT1b, cN0, cM0) - Signed by Dellia Beckwith, MD on 02/14/2021 Histopathologic type: Adenocarcinoma, NOS Stage prefix: Initial diagnosis Histologic grade (G): GX Histologic grading system: 4 grade system Laterality: Left Tumor size (mm): 19 Lymph-vascular invasion (LVI): LVI not present (absent)/not identified Diagnostic confirmation: Positive histology Specimen type: Bronchial Biopsy Staged by: Managing physician Stage used in treatment planning: Yes National guidelines used in treatment planning: Yes Type of national guideline used in treatment planning: NCCN Staging comments: Rec surgical resection    05/11/2021 Cancer Staging    Staging form: Lung, AJCC 8th Edition - Pathologic stage from 05/11/2021: Stage IIB (ypT1c, pN1, cM0) - Signed by Dellia Beckwith, MD on 11/12/2021 Histopathologic type: Adenocarcinoma, NOS Stage prefix: Post-therapy Histologic grade (G): G2 Histologic grading  system: 4 grade system Residual tumor (R): R0 - None Laterality: Left Tumor size (mm): 25 Lymph-vascular invasion (LVI): LVI not present (absent)/not identified Diagnostic confirmation: Positive histology PLUS positive immunophenotyping and/or positive genetic studies Specimen type: Excision Staged by: Managing physician Type of lung cancer: Surgically resected non-small cell lung cancer ECOG performance status: Grade 1 Perineural invasion (PNI): Absent Pleural/elastic layer invasion: PL0 Pleural lavage cytology: Unknown Weight loss: Absent Adequacy of mediastinal dissection: Adequate Radiotherapy dose: No Adjuvant radiation: No Adjuvant chemotherapy: Yes Stage used in treatment planning: Yes National guidelines used in treatment planning: Yes Type of national guideline used in treatment planning: NCCN Staging comments: Adjuvant pembrolizumab, PDL 1 90%     10/04/2021 -  Chemotherapy    Patient is on Treatment Plan : LUNG NSCLC Pembrolizumab Adj q21d x 18 cycles     Interval History   Jaquita is here today for a routine follow up prior to her 9th cycle of Pembrolizumab. She notes that she has been experiencing Nose itch along with her eyes and has asked for any suggestions of what she cant take over the counter to help relieve this. I have suggested Zyrtec or any nasal spray. She states she has stopped smoking and continues to take holistic therapeutic methods during this time. She notes that treatment day she has no complaints, the day after she feels the most tired, and usual;ly by day 3 she is slowly getting back . She continues taking 3 potassium supplements a day. She denies fever, chills or other signs of infection.  She denies vomiting or abdominal pain.  She denies sore throat, or chest pain. Her weight is down 2 pounds since her last visit, her appetite is fair, better than before.  Her grandson will be graduating and May and so she is asked to postpone that treatment.     HISTORY:        Past Medical History:  Diagnosis Date   Allergy     Arthritis     Complication of anesthesia      slow to wake up   COPD (chronic obstructive pulmonary disease) (HCC)     COPD, mild (HCC) 12/15/2020   Family history of pancreatic cancer 11/28/2020   Family history of prostate cancer 11/28/2020   Family history of stomach cancer 11/28/2020   Genetic testing 12/18/2020    [facility] Multi-Cancer Panel was Negative. Report date is 12/12/2020.     The Multi-Cancer + RNA Panel offered by [employer] includes sequencing and/or deletion/duplication analysis of the following 84 genes:  AIP*, ALK, APC*, ATM*, AXIN2*, BAP1*, BARD1*, BLM*, BMPR1A*, BRCA1*, BRCA2*, BRIP1*, CASR, CDC73*, CDH1*, CDK4, CDKN1B*, CDKN1C*, CDKN2A, CEBPA, CHEK2*, CTNNA1*, DICER1*, DIS3L2*, EGFR, EPCAM, FH   GERD (gastroesophageal reflux disease)     Heartburn 12/27/2021   Hormone disorder  11/20/2020   Hypertension     Hypokalemia 12/27/2021   Leukocytosis 01/17/2022   Migraines     Nodule of left lung 11/21/2020   Non-small cell lung cancer, left (HCC) 01/15/2021   Tobacco use 12/07/2020           Past Surgical History:  Procedure Laterality Date   ABDOMINAL HYSTERECTOMY   1989    unsure of vaginal or abdominal   BRONCHIAL BIOPSY   01/15/2021    Procedure: BRONCHIAL BIOPSIES;  Surgeon: Leslye Peer, MD;  Location: [facility];  Service: Pulmonary;;   BRONCHIAL BRUSHINGS   01/15/2021    Procedure: BRONCHIAL BRUSHINGS;  Surgeon: Leslye Peer, MD;  Location: [facility];  Service: Pulmonary;;   BRONCHIAL NEEDLE ASPIRATION BIOPSY   01/15/2021    Procedure: BRONCHIAL NEEDLE ASPIRATION BIOPSIES;  Surgeon: Leslye Peer, MD;  Location: [facility] ENDOSCOPY;  Service: Pulmonary;;   COLONOSCOPY   2022   FIDUCIAL MARKER PLACEMENT   01/15/2021    Procedure: FIDUCIAL MARKER PLACEMENT;  Surgeon: Leslye Peer, MD;  Location: [facility] ENDOSCOPY;  Service: Pulmonary;;   ovaries removed   1986   surgery to remove scar tissue        from colon/bladder in patient's late 30's   TUBAL LIGATION   1986   UPPER GI ENDOSCOPY        years ago in patient's late 62s   VIDEO BRONCHOSCOPY WITH RADIAL ENDOBRONCHIAL ULTRASOUND   01/15/2021    Procedure: VIDEO BRONCHOSCOPY WITH RADIAL ENDOBRONCHIAL ULTRASOUND;  Surgeon: Leslye Peer, MD;  Location: [facility] ENDOSCOPY;  Service: Pulmonary;;           Family History  Problem Relation Age of Onset   Pancreatic cancer Maternal Aunt 50   Brain cancer Maternal Uncle     Stomach cancer Maternal Uncle          dx. >50   Prostate cancer Half-Brother          passed away at 80   Prostate cancer Half-Brother        Social History:  reports that she quit smoking about 10 months ago. Her smoking use included cigarettes. She started smoking about 61 years ago. She has a 29.50 pack-year smoking history. She has never used smokeless tobacco. She reports  current alcohol use of about 4.0 standard drinks of alcohol per week. She reports current drug use. Frequency: 3.00 times per week. Drug: Marijuana. She is widowed and lives at home.  She has quit smoking since April 12, when she had her surgical resection.  She has 2 biological children and 1 stepchild. She is disabled, and has been exposed to chemicals or other toxic agents. She was previously employed at a hosiery, chicken farm and cleaning houses. 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   Allergies:       Allergies  Allergen Reactions   Codeine Rash   Demerol [Meperidine Hcl] Other (See Comments)      coma   Other Dermatitis      Stainless steel / skin weeps   Silver Dermatitis      Sterling silver skin weeps   Gold Bond [Menthol-Zinc Oxide] Dermatitis      Skin weeps   Neosporin [Bacitracin-Polymyxin B] Dermatitis      Skin weeps   Wound Dressing Adhesive Other (See Comments)      CAUSES SKIN TEARS PER PATIENT. INSTEAD USE THE TEGADERM 32M WITH ADHESIVE FREE WINDOW OVER PORT.      Current Medications:       Current Outpatient Medications  Medication Sig Dispense Refill   albuterol (VENTOLIN HFA) 108 (90 Base) MCG/ACT inhaler Inhale 2 puffs into the lungs every 4 (four) hours as needed for wheezing or shortness of breath.       amitriptyline (ELAVIL) 10 MG tablet Take 10 mg by mouth at bedtime as needed for sleep.       Ascorbic Acid (VITAMIN C WITH ROSE HIPS) 500 MG tablet Take 500 mg by mouth daily.       aspirin 81 MG EC tablet Take 81 mg by mouth daily.       atorvastatin (LIPITOR) 40 MG tablet Take 40 mg by  mouth daily.       b complex vitamins capsule Take 1 capsule by mouth daily. (Patient not taking: Reported on 01/17/2022)       Biotin 5000 MCG CAPS Take 5,000 mcg by mouth daily.       Calcium-Vitamin D-Vitamin K (CHEWABLE CALCIUM PO) Take 650 mg by mouth daily.       celecoxib (CELEBREX) 200 MG capsule Take 200 mg by mouth daily.        fluconazole (DIFLUCAN) 100 MG tablet Take 1 tablet (100 mg total) by mouth daily. 10 tablet 0   gabapentin (NEURONTIN) 300 MG capsule Take 300 mg by mouth at bedtime.       hydrochlorothiazide (HYDRODIURIL) 25 MG tablet Take 25 mg by mouth every morning.       HYDROcodone-acetaminophen (NORCO/VICODIN) 5-325 MG tablet Take 1 tablet by mouth every 6 (six) hours as needed for moderate pain or severe pain. 30 tablet 0   levothyroxine (SYNTHROID) 50 MCG tablet Take 1 tablet (50 mcg total) by mouth daily before breakfast. 30 tablet 2   lidocaine-prilocaine (EMLA) cream Apply 1 Application topically as needed. 30 g 0   omeprazole (PRILOSEC) 40 MG capsule Take 1 capsule (40 mg total) by mouth daily. 30 capsule 2   ondansetron (ZOFRAN) 4 MG tablet Take 1 tablet (4 mg total) by mouth every 4 (four) hours as needed for nausea. 90 tablet 3   potassium chloride SA (KLOR-CON M) 20 MEQ tablet Take 20 mEq by mouth daily.       prochlorperazine (COMPAZINE) 10 MG tablet Take 1 tablet (10 mg total) by mouth every 6 (six) hours as needed for nausea or vomiting. 30 tablet 2   vitamin E 180 MG (400 UNITS) capsule Take 400 Units by mouth daily. (Patient not taking: Reported on 01/17/2022)        No current facility-administered medications for this visit.      REVIEW OF SYSTEMS:  Review of Systems  Constitutional: Negative.  Negative for appetite change, chills, fatigue, fever and unexpected weight change.  HENT:  Negative.    Eyes: Negative.   Respiratory:  Negative for chest tightness, cough (occasionally productive with clear sputum), hemoptysis and shortness of breath. Wheezing: occasional.  Cardiovascular: Negative.  Negative for chest pain, leg swelling and palpitations.  Gastrointestinal: Negative.  Negative for abdominal distention, abdominal pain, blood in stool, constipation, diarrhea, nausea and vomiting.  Endocrine: Negative.   Genitourinary: Negative.  Negative for difficulty urinating, dysuria,  frequency and hematuria.   Musculoskeletal: Negative.  Negative for arthralgias, back pain, flank pain, gait problem and myalgias.  Skin: Negative.   Neurological: Negative.  Negative for dizziness, extremity weakness, gait problem, headaches, light-headedness, numbness, seizures and speech difficulty.  Hematological: Negative.   Psychiatric/Behavioral: Negative.  Negative for depression and sleep disturbance. The patient is not nervous/anxious.        Dissociative identity disorder        VITALS:  Blood pressure 126/87, pulse 65, temperature 98 F (36.7 C), temperature source Oral, resp. rate 17, height 4\' 11"  (1.499 m), weight 132 lb 4.8 oz (60 kg), SpO2 99 %.     Wt Readings from Last 3 Encounters:  03/04/22 131 lb (59.4 kg)  02/28/22 132 lb 4.8 oz (60 kg)  02/07/22 137 lb 3.2 oz (62.2 kg)    Body mass index is 26.72 kg/m.   Performance status (ECOG): 1 - Symptomatic but completely ambulatory   PHYSICAL EXAM:  Physical Exam Constitutional:  General: She is not in acute distress.    Appearance: Normal appearance. She is normal weight.  HENT:     Head: Normocephalic and atraumatic.  Eyes:     General: No scleral icterus.    Extraocular Movements: Extraocular movements intact.     Conjunctiva/sclera: Conjunctivae normal.     Pupils: Pupils are equal, round, and reactive to light.  Cardiovascular:     Rate and Rhythm: Normal rate and regular rhythm.     Pulses: Normal pulses.     Heart sounds: Normal heart sounds. No murmur heard.    No friction rub. No gallop.  Pulmonary:     Effort: Pulmonary effort is normal. No respiratory distress.     Breath sounds: Normal breath sounds.  Abdominal:     General: Bowel sounds are normal. There is no distension.     Palpations: Abdomen is soft. There is no hepatomegaly, splenomegaly or mass.     Tenderness: There is no abdominal tenderness.  Musculoskeletal:        General: Normal range of motion.     Cervical back: Normal  range of motion and neck supple.     Right lower leg: No edema.     Left lower leg: No edema.  Lymphadenopathy:     Cervical: No cervical adenopathy.  Skin:    General: Skin is warm and dry.  Neurological:     General: No focal deficit present.     Mental Status: She is alert and oriented to person, place, and time. Mental status is at baseline.  Psychiatric:        Mood and Affect: Mood normal.        Behavior: Behavior normal.        Thought Content: Thought content normal.        Judgment: Judgment normal.          LABS:        Latest Ref Rng & Units 02/28/2022   12:00 AM 02/07/2022   11:29 AM 01/17/2022   12:00 AM  CBC  WBC   10.6     8.2  11.5      Hemoglobin 12.0 - 16.0 12.5     13.0  13.6      Hematocrit 36 - 46 36     38.6  40      Platelets 150 - 400 K/uL 307     267  318         This result is from an external source.        Latest Ref Rng & Units 02/28/2022   12:00 AM 02/07/2022   11:29 AM 01/17/2022    9:41 AM  CMP  Glucose 70 - 99 mg/dL   94  74   BUN 4 - 21 15     8  12    Creatinine 0.5 - 1.1 1.0     1.21  1.26   Sodium 137 - 147 141     136  141   Potassium 3.5 - 5.1 mEq/L 3.3     2.7  3.2   Chloride 99 - 108 102     96  103   CO2 13 - 22 29     30  29    Calcium 8.7 - 10.7 9.6     9.3  10.0   Total Protein 6.5 - 8.1 g/dL   7.8  8.1   Total Bilirubin 0.3 - 1.2 mg/dL   1.1  0.9  Alkaline Phos 25 - 125 69     56  56   AST 13 - 35 33     62  35   ALT 7 - 35 U/L This result is from an external source.        Recent Labs       Lab Results  Component Value Date    CEA1 2.8 11/08/2021     /  Last Labs         CEA  Date Value Ref Range Status  11/08/2021 2.8 0.0 - 4.7 ng/mL Final      Comment:      (NOTE)                             Nonsmokers          <3.9                             Smokers             <5.6 Roche [employer] Electrochemiluminescence Immunoassay (ECLIA) Values obtained with different assay methods or  kits cannot be used interchangeably.  Results cannot be interpreted as absolute evidence of the presence or absence of malignant disease. Performed At: [facility] [address] [place], [place] [id number] Jolene Schimke MD WG:[id number]            STUDIES:  Imaging Results  No results found.  EXAM:2/202/24 CT CHEST WITH CONTRAST IMPRESSION Numerous small scattered bilateral pulmonary nodules are all stable, including persistent stable lingular nodules that were new on her October Ct=T scan, which may indicate pulmonary metastases.  No new or progressive metastatic disease in the chest Trace basilar left pleural effusion decreased.  Aortic Atherosclerosis and Emphysema.     EXAM: 10/13/2020 CT CHEST WITHOUT CONTRAST LOW-DOSE FOR LUNG CANCER SCREENING    TECHNIQUE:  Multidetector CT imaging of the chest was performed following the  standard protocol without IV contrast.    COMPARISON: None.    FINDINGS:  Cardiovascular: Atherosclerotic calcification of the aorta. Heart  size normal. No pericardial effusion.  Mediastinum/Nodes: No pathologically enlarged mediastinal or  axillary lymph nodes. Hilar regions are difficult to definitively  evaluate without IV contrast but appear grossly unremarkable. There  may be distal esophageal wall thickening which can be seen with  gastroesophageal reflux.  Lungs/Pleura: Mild biapical pleuroparenchymal scarring.  Centrilobular and paraseptal emphysema. Smoking related respiratory  bronchiolitis. 11.2 mm peripheral left lower lobe nodule (4/178) has  somewhat lobulated borders. Other pulmonary nodules measure 5.3 mm  or less in size. No pleural fluid. Airway is unremarkable.  Upper Abdomen: Visualized portions of the liver, gallbladder,  adrenal glands, kidneys, spleen, pancreas, stomach and bowel are  grossly unremarkable.  Musculoskeletal: No worrisome lytic or sclerotic lesions.    IMPRESSION:  1. Lung-RADS 4A,  suspicious. Follow up low-dose chest CT without  contrast in 3 months (please use the following order, "CT CHEST LCS  NODULE FOLLOW-UP W/O CM") is recommended. Alternatively, PET may be  considered when there is a solid component 8mm or larger. 11.2 mm  peripheral left lower lobe nodule. These results will be called to  the ordering clinician or representative by the Radiologist  Assistant, and communication documented in the PACS or [employer].  2. Aortic atherosclerosis (ICD10-I70.0).  3. Emphysema (ICD10-J43.9).  EXAM: 11/01/2020 NUCLEAR MEDICINE PET SKULL BASE TO THIGH   TECHNIQUE: 6.51 mCi F-18 FDG was injected intravenously. Full-ring PET imaging was performed from the skull base to thigh after the radiotracer. CT data was obtained and used for attenuation correction and anatomic localization.   Fasting blood glucose: 103 mg/dl   COMPARISON:  Chest CT 10/13/2020   FINDINGS: Mediastinal blood pool activity: SUV max 2.13   Liver activity: SUV max NA   NECK: No hypermetabolic lymph nodes in the neck.   Incidental CT findings: none   CHEST: 12.5 mm lobulated left lower lobe subpleural pulmonary nodule is hypermetabolic with SUV max of 5.65 and consistent with primary lung neoplasm.   No enlarged or hypermetabolic mediastinal or hilar lymph nodes to suggest metastatic adenopathy. No supraclavicular or axillary adenopathy.   No other pulmonary lesions.   Incidental CT findings: Stable underlying emphysematous changes and stable vascular calcifications.   ABDOMEN/PELVIS: No abnormal hypermetabolic activity within the liver, pancreas, adrenal glands, or spleen. No hypermetabolic lymph nodes in the abdomen or pelvis.   Incidental CT findings: Scattered atherosclerotic calcifications involving the aorta and iliac arteries.   SKELETON: No significant bony findings. No evidence of metastatic disease.   Incidental CT findings: none   IMPRESSION: 1.  Solitary 12.5 mm left lower lobe pulmonary nodule is hypermetabolic and consistent with small primary lung neoplasm. 2. No findings for mediastinal or hilar adenopathy or metastatic disease elsewhere.   FINAL MICROSCOPIC DIAGNOSIS: 01/15/2021  A. LUNG, LLL, BRUSH:  - Atypical cells present   B. LUNG, LLL, FINE NEEDLE ASPIRATION:  - Malignant cells consistent with adenocarcinoma, see comment   COMMENT:   B.   Immunohistochemical stains show that the tumor cells are positive  for TTF-1 while they are negative for p63 and CK5/6, consistent with  above interpretation.  Dr. Luisa Hart reviewed the case and concurs with  the diagnosis.  Dr. Delton Coombes was notified on 01/19/2021.        I,Gabriella Ballesteros,acting as a scribe for Dellia Beckwith, MD.,have documented all relevant documentation on the behalf of Dellia Beckwith, MD,as directed by  Dellia Beckwith, MD while in the presence of Dellia Beckwith, MD.

## 2022-04-12 ENCOUNTER — Telehealth: Payer: Self-pay

## 2022-04-12 LAB — T4: T4, Total: 9 ug/dL (ref 4.5–12.0)

## 2022-04-12 MED FILL — Pembrolizumab IV Soln 100 MG/4ML (25 MG/ML): INTRAVENOUS | Qty: 8 | Status: AC

## 2022-04-12 NOTE — Telephone Encounter (Signed)
Patient voiced at appt yesterday taking potassium daily. Today patient reports taking potassium

## 2022-04-12 NOTE — Telephone Encounter (Signed)
Patient reported yesterday at appt taking potassium one tab daily. Today she reports taking three tablets daily. Patient instructed to take 6 tablets daily or come in for IV potassium, patient voiced she would increase meds from 3 tabs to 6 tabs.

## 2022-04-12 NOTE — Telephone Encounter (Signed)
-----   Message from Belva Chimes, LPN sent at [id number]  8:28 AM EDT ----- Regarding: FW: call  ----- Message ----- From: Derwood Kaplan, MD Sent: 04/11/2022   6:16 PM EDT To: Juanetta Beets, RPH; Belva Chimes, LPN Subject: call                                           Tell her thyroid continues to improve so we will stay on same dose for now.  But K is very low again at 2.8. She needs to increase her supplement to 6/day OR come in for IV K on Friday. Rest looks good

## 2022-04-15 ENCOUNTER — Inpatient Hospital Stay: Payer: Medicare HMO

## 2022-04-15 DIAGNOSIS — C3492 Malignant neoplasm of unspecified part of left bronchus or lung: Secondary | ICD-10-CM

## 2022-04-15 DIAGNOSIS — Z5112 Encounter for antineoplastic immunotherapy: Secondary | ICD-10-CM | POA: Diagnosis not present

## 2022-04-15 MED ORDER — SODIUM CHLORIDE 0.9 % IV SOLN
200.0000 mg | Freq: Once | INTRAVENOUS | Status: AC
  Administered 2022-04-15: 200 mg via INTRAVENOUS
  Filled 2022-04-15: qty 8

## 2022-04-15 MED ORDER — HEPARIN SOD (PORK) LOCK FLUSH 100 UNIT/ML IV SOLN
500.0000 [IU] | Freq: Once | INTRAVENOUS | Status: AC | PRN
  Administered 2022-04-15: 500 [IU]

## 2022-04-15 MED ORDER — SODIUM CHLORIDE 0.9 % IV SOLN: Freq: Once | INTRAVENOUS | Status: AC

## 2022-04-15 MED ORDER — SODIUM CHLORIDE 0.9% FLUSH
10.0000 mL | INTRAVENOUS | Status: DC | PRN
  Administered 2022-04-15: 10 mL

## 2022-04-15 NOTE — Patient Instructions (Signed)
[facility]  Discharge Instructions: Thank you for choosing [facility] to provide your oncology and hematology care.  If you have a lab appointment with the Cancer Center, please go directly to the Cancer Center and check in at the registration area.   Wear comfortable clothing and clothing appropriate for easy access to any Portacath or PICC line.   We strive to give you quality time with your provider. You may need to reschedule your appointment if you arrive late (15 or more minutes).  Arriving late affects you and other patients whose appointments are after yours.  Also, if you miss three or more appointments without notifying the office, you may be dismissed from the clinic at the provider's discretion.      For prescription refill requests, have your pharmacy contact our office and allow 72 hours for refills to be completed.    Today you received the following chemotherapy and/or immunotherapy agents   To help prevent nausea and vomiting after your treatment, we encourage you to take your nausea medication as directed.  BELOW ARE SYMPTOMS THAT SHOULD BE REPORTED IMMEDIATELY: *FEVER GREATER THAN 100.4 F (38 C) OR HIGHER *CHILLS OR SWEATING *NAUSEA AND VOMITING THAT IS NOT CONTROLLED WITH YOUR NAUSEA MEDICATION *UNUSUAL SHORTNESS OF BREATH *UNUSUAL BRUISING OR BLEEDING *URINARY PROBLEMS (pain or burning when urinating, or frequent urination) *BOWEL PROBLEMS (unusual diarrhea, constipation, pain near the anus) TENDERNESS IN MOUTH AND THROAT WITH OR WITHOUT PRESENCE OF ULCERS (sore throat, sores in mouth, or a toothache) UNUSUAL RASH, SWELLING OR PAIN  UNUSUAL VAGINAL DISCHARGE OR ITCHING   Items with * indicate a potential emergency and should be followed up as soon as possible or go to the Emergency Department if any problems should occur.  Please show the CHEMOTHERAPY ALERT CARD or IMMUNOTHERAPY ALERT CARD at check-in to the Emergency  Department and triage nurse.  Should you have questions after your visit or need to cancel or reschedule your appointment, please contact [facility]  Dept: [phone]  and follow the prompts.  Office hours are 8:00 a.m. to 4:30 p.m. Monday - Friday. Please note that voicemails left after 4:00 p.m. may not be returned until the following business day.  We are closed weekends and major holidays. You have access to a nurse at all times for urgent questions. Please call the main number to the clinic Dept: [phone] and follow the prompts.  For any non-urgent questions, you may also contact your provider using MyChart. We now offer e-Visits for anyone 18 and older to request care online for non-urgent symptoms. For details visit [email or web address].   Also download the MyChart app! Go to the app store, search "MyChart", open the app, select [facility], and log in with your MyChart username and password.   

## 2022-05-01 NOTE — Progress Notes (Signed)
[facility]  [address] 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 This was initially found on lung cancer screening CT in September 2022. Most recent CT imaging revealed the solitary left lower lobe nodule to be enlarging, measuring 1.2 cm in September and now measuring 1.4 x 1.9 cm.  She initially met with Sydney Hunt for consultation, and he recommended SBRT as he felt that her smoking would cause excessive risk for postoperative complications.  She sought another opinion with  Sydney Hunt in [place] who agreed to her surgical management. Her daughter lives in [place] and will be her support system.  She has now had her surgery on April 12 with left thoracotomy and left lower lobe lobectomy along with mediastinal lymph node dissection.  Pathology revealed an adenocarcinoma grade 2 measuring 2.5 cm in diameter but with no evidence of visceral pleural invasion or direct invasion of adjacent structures.  All margins are negative for invasive carcinoma.  She did have involvement of 2 out of 20 lymph nodes for a T1c N1 M0, stage IIB.  EGFR and ALK mutations were negative but PD-L1 was 90%.  Her doctor there recommended chemotherapy with immunotherapy but she refused chemotherapy.  She was only willing to take the immunotherapy for 1 year and had a port placed.  She had her first dose on September 7 and her second dose on September 28.  She has now relocated to [facility] and we have continued this regimen, and we plan a total of 1 year.     Tobacco abuse.  She has not smoked at all since April 12 when she had her surgery. "She fell off the wagon" recently, but has stopped again.    3.    Dissociative identity disorder with 3 personalities. She continues to follow with a psychiatrist.     4.    Hypokalemia.  Potassium was down to 2.8 last month and so she is on supplement now 2 pills twice daily. We will now increase this to three pills twice daily since it is only 3.0.  There is an issue with compliance    Plan:  She informed me that the Prilosec isn't working as well as before. I recommended that she take it twice a day 1 in the morning and 1 at night. She continues to take 2 potassium supplements twice a day although her potassium is 3.0. I advised her to take 3 in the morning and 3 at night and increase it in her diet.  She is scheduled for day 1 cycle 10 of Pembrolizumab on 05/06/2022. I will add IV potassium 20 MEQ during her next treatment. I will see her back in 3 weeks with CBC and CMP. She will receive her treatment on May, 22nd and I will see her on May, 15th with labs. We will repeat CT chest in May or June. She understands and agrees with this plan of care. I have answered her questions and she knows to call with any concerns.     Later we will need to arrange for annual mammography and colonoscopy when she is due, since she does have a history of colon polyps.     I provided 20 minutes of face-to-face time during this this encounter and > 50%  was spent counseling as documented under my assessment and plan.      Sydney Beckwith, MD [facility] AT [facility] [address] [place] [address] Dept: [phone] Dept Fax: [phone]      CHIEF COMPLAINT:  CC: Adenocarcinoma of the left lung   Current Treatment:  Postoperative adjuvant immunotherapy   HISTORY OF PRESENT ILLNESS:  Sydney Hunt is a 62 y.o. female with a history of tobacco abuse who has adenocarcinoma of the left lung. This was originally found on lung cancer screening CT from September 2022. PET imaging was obtained in October which revealed a solitary 1.25 cm left lower lobe pulmonary nodule to be hypermetabolic and consistent with small  primary lung neoplasm. No findings for mediastinal or hilar adenopathy or metastatic disease elsewhere was observed. She met with the genetic counselor and [facility] diagnostic testing was negative for any clinically significant mutations. CT chest from December revealed enlarging left lower lobe nodule, measuring 1.4 x 1.9 cm. The additional smaller pulmonary nodules remained unchanged. She was referred to Dr. Levy Hunt and underwent bronchoscopy on December 19th and cytology from this procedure confirmed malignant cells consistent with adenocarcioma. Immunohistochemical stains were positive for TTF-1, and negative for p63 and CK5/6. 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 Her FEV1 was 1.98, which is 88% of predicted and her FVC was 2.58, which is 89% of predicted. She was deemed a candidate for resection and met with Sydney Hunt for consultation. As she is not motivated to quit smoking, he felt that surgery would present an excessive risk for postoperative respiratory complications. He recommended SBRT as a good option if the patient was unable to quit smoking.  The patient went for a second opinion to Dr. Lucienne Hunt and surgical resection was carried out on April 12 with a left lower lobe lobectomy.  She was hospitalized for 13 days and has not smoked since that time.  Pathology revealed a 2.5 cm adenocarcinoma, grade 2.  There was no evidence of pleural invasion and margins are clear but she tells me 2 of 20 nodes were positive, for a T1c N1 M0, stage IIB.  A port was placed on August 16 and she was started on pembrolizumab on September 7 and had her second dose on September 28.   I have reviewed her chart and materials related to her cancer extensively and collaborated history with the patient. Summary of oncologic history is as follows:    Oncology History  Non-small cell lung cancer, left (HCC)  01/15/2021 Initial Diagnosis    Non-small cell lung cancer, left (HCC)     02/12/2021 Cancer Staging    Staging form: Lung, AJCC 8th Edition - Clinical stage from 02/12/2021: Stage IA2 (cT1b, cN0, cM0) - Signed by Sydney Beckwith, MD on 02/14/2021 Histopathologic type: Adenocarcinoma, NOS Stage prefix: Initial diagnosis Histologic grade (G): GX Histologic grading system: 4 grade system Laterality: Left Tumor size (mm): 19 Lymph-vascular invasion (LVI): LVI not present (absent)/not identified Diagnostic confirmation: Positive histology Specimen type: Bronchial Biopsy Staged by: Managing physician Stage used in treatment planning: Yes National guidelines used in treatment planning: Yes Type of national guideline used in treatment planning: NCCN Staging comments: Rec surgical resection    05/11/2021 Cancer Staging    Staging form: Lung, AJCC 8th Edition - Pathologic stage from 05/11/2021: Stage IIB (ypT1c, pN1, cM0) - Signed by Sydney Beckwith, MD on 11/12/2021 Histopathologic type: Adenocarcinoma, NOS Stage prefix: Post-therapy Histologic  grade (G): G2 Histologic grading system: 4 grade system Residual tumor (R): R0 - None Laterality: Left Tumor size (mm): 25 Lymph-vascular invasion (LVI): LVI not present (absent)/not identified Diagnostic confirmation: Positive histology PLUS positive immunophenotyping and/or positive genetic studies Specimen type: Excision Staged by: Managing physician Type of lung cancer: Surgically resected non-small cell lung cancer ECOG performance status: Grade 1 Perineural invasion (PNI): Absent Pleural/elastic layer invasion: PL0 Pleural lavage cytology: Unknown Weight loss: Absent Adequacy of mediastinal dissection: Adequate Radiotherapy dose: No Adjuvant radiation: No Adjuvant chemotherapy: Yes Stage used in treatment planning: Yes National guidelines used in treatment planning: Yes Type of national guideline used in treatment planning: NCCN Staging comments: Adjuvant pembrolizumab, PDL 1 90%    10/04/2021 -   Chemotherapy    Patient is on Treatment Plan : LUNG NSCLC Pembrolizumab Adj q21d x 18 cycles      INTERVAL HISTORY:  Sydney Hunt is here today for a routine follow up for adenocarcinoma of the left lung. Patient states that she is ok and informed me that Sunday she ran out of oxygen while doing a lot of walking and ever since then her left lateral thorax has began to hurt, feeling like a stabbing pain rating a 7/10 with a hard cough and clear phlegm, which is pleuritic in nature. She is taking pain medication to alleviate the pain but only when absolutely necessary. I told her to let me know if it gets worse or if she develops a fever or purulent sputum. She informed me that the Prilosec isn't working as well as before. I recommended that she take it twice a day, 1 in the morning and 1 at night. She continues to take 2 potassium supplements twice a day but her potassium is only 3.0. This may be an issue of compliance I advised her to take 3 in the morning and 3 at night and increase it in her diet.  She also informed me that on May 19 she will be out of town for her grandson's graduation and asked if she can receive her treatment on May, 22nd and I will see her on May, 15th with labs. She is scheduled for day 1 cycle 10 of Pembrolizumab on 05/06/2022. I will see her back in 3 weeks with CBC and CMP. We will repeat CT chest in May or June. She denies signs of infection such as sore throat, sinus drainage, cough, or urinary symptoms.  She denies fevers or recurrent chills. She denies pain. She denies nausea, vomiting, chest pain, dyspnea or cough. Her weight has decreased 1 pounds over last month .    HISTORY:   Past Medical History:  Diagnosis Date   Allergy    Arthritis    Complication of anesthesia    slow to wake up   COPD (chronic obstructive pulmonary disease)    COPD, mild 12/15/2020   Family history of pancreatic cancer 11/28/2020   Family history of prostate cancer 11/28/2020   Family history of  stomach cancer 11/28/2020   Genetic testing 12/18/2020   [facility] Multi-Cancer Panel was Negative. Report date is 12/12/2020.     The Multi-Cancer + RNA Panel offered by [facility] includes sequencing and/or deletion/duplication analysis of the following 84 genes:  AIP*, ALK, APC*, ATM*, AXIN2*, BAP1*, BARD1*, BLM*, BMPR1A*, BRCA1*, BRCA2*, BRIP1*, CASR, CDC73*, CDH1*, CDK4, CDKN1B*, CDKN1C*, CDKN2A, CEBPA, CHEK2*, CTNNA1*, DICER1*, DIS3L2*, EGFR, EPCAM, FH   GERD (gastroesophageal reflux disease)    Heartburn 12/27/2021   Hormone disorder 11/20/2020   Hypertension  Hypokalemia 12/27/2021   Leukocytosis 01/17/2022   Migraines    Nodule of left lung 11/21/2020   Non-small cell lung cancer, left 01/15/2021   Tobacco use 12/07/2020   Past Surgical History:  Procedure Laterality Date   ABDOMINAL HYSTERECTOMY  1989   unsure of vaginal or abdominal   BRONCHIAL BIOPSY  01/15/2021   Procedure: BRONCHIAL BIOPSIES;  Surgeon: Leslye Peer, MD;  Location: [facility] ENDOSCOPY;  Service: Pulmonary;;   BRONCHIAL BRUSHINGS  01/15/2021   Procedure: BRONCHIAL BRUSHINGS;  Surgeon: Leslye Peer, MD;  [facility] END[facility]COPY;  Service: Pulmonary;;   BRONCHIAL NEEDLE ASPIRATION BIOPSY  01/15/2021   Procedure: BRONCHIAL NEEDLE ASPIRATION BIOPSIES;  Surgeon: Leslye Peer, MD;  Location: [facility] ENDOSCOPY;  Service: Pulmonary;;   COLONOSCOPY  2022   FIDUCIAL MARKER PLACEMENT  01/15/2021   Procedure: FIDUCIAL MARKER PLACEMENT;  Surgeon: Leslye Peer, MD;  Location: [facility] ENDOSCOPY;  Service: Pulmonary;;   ovaries removed  1986   surgery to remove scar tissue     from colon/bladder in patient's late 30's   TUBAL LIGATION  1986   UPPER GI ENDOSCOPY     years ago in patient's late 89s   VIDEO BRONCHOSCOPY WITH RADIAL ENDOBRONCHIAL ULTRASOUND  01/15/2021   Procedure: VIDEO BRONCHOSCOPY WITH RADIAL ENDOBRONCHIAL ULTRASOUND;  Surgeon: Leslye Peer, MD;  Location: [facility] ENDOSCOPY;  Service: Pulmonary;;   Family History   Problem Relation Age of Onset   Pancreatic cancer Maternal Aunt 50   Brain cancer Maternal Uncle    Stomach cancer Maternal Uncle        dx. >50   Prostate cancer Half-Brother        passed away at 45   Prostate cancer Half-Brother    Social History:  reports that she quit smoking about 10 months ago. Her smoking use included cigarettes. She started smoking about 61 years ago. She has a 29.50 pack-year smoking history. She has never used smokeless tobacco. She reports current alcohol use of about 4.0 standard drinks of alcohol per week. She reports current drug use. Frequency: 3.00 times per week. Drug: Marijuana. She is widowed and lives at home.  She has quit smoking since April 12, when she had her surgical resection.  She has 2 biological children and 1 stepchild. She is disabled, and has been exposed to chemicals or other toxic agents. She was previously employed at a hosiery, chicken farm and cleaning houses. 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   Allergies  Allergen Reactions   Codeine Rash   Demerol [Meperidine Hcl] Other (See Comments)    coma   Meperidine Other (See Comments)    Several days will pass without patient being aware   Other Dermatitis    Stainless steel / skin weeps   Silver Dermatitis    Sterling silver skin weeps   Gold Bond [Menthol-Zinc Oxide] Dermatitis    Skin weeps   Neosporin [Bacitracin-Polymyxin B] Dermatitis    Skin weeps   Wound Dressing Adhesive Other (See Comments)    CAUSES SKIN TEARS PER PATIENT. INSTEAD USE THE TEGADERM 7M WITH ADHESIVE FREE WINDOW OVER PORT.    Current Medications:       Current Outpatient Medications  Medication Sig Dispense Refill   albuterol (VENTOLIN HFA) 108 (90 Base) MCG/ACT inhaler Inhale 2 puffs into the lungs every 4 (four) hours as needed for wheezing or shortness of breath.       amitriptyline (ELAVIL) 10 MG tablet Take 10 mg  by mouth at bedtime as needed for sleep.        Ascorbic Acid (VITAMIN C WITH ROSE HIPS) 500 MG tablet Take 500 mg by mouth daily.       aspirin 81 MG EC tablet Take 81 mg by mouth daily.       atorvastatin (LIPITOR) 40 MG tablet Take 40 mg by mouth daily.       b complex vitamins capsule Take 1 capsule by mouth daily. (Patient not taking: Reported on 01/17/2022)       Biotin 5000 MCG CAPS Take 5,000 mcg by mouth daily.       Calcium-Vitamin D-Vitamin K (CHEWABLE CALCIUM PO) Take 650 mg by mouth daily.       celecoxib (CELEBREX) 200 MG capsule Take 200 mg by mouth daily.       fluconazole (DIFLUCAN) 100 MG tablet Take 1 tablet (100 mg total) by mouth daily. 10 tablet 0   gabapentin (NEURONTIN) 300 MG capsule Take 300 mg by mouth at bedtime.       hydrochlorothiazide (HYDRODIURIL) 25 MG tablet Take 25 mg by mouth every morning.       HYDROcodone-acetaminophen (NORCO/VICODIN) 5-325 MG tablet Take 1 tablet by mouth every 6 (six) hours as needed for moderate pain or severe pain. 30 tablet 0   levothyroxine (SYNTHROID) 50 MCG tablet Take 1 tablet (50 mcg total) by mouth daily before breakfast. 30 tablet 2   lidocaine-prilocaine (EMLA) cream Apply 1 Application topically as needed. 30 g 0   omeprazole (PRILOSEC) 40 MG capsule Take 1 capsule (40 mg total) by mouth daily. 30 capsule 2   ondansetron (ZOFRAN) 4 MG tablet Take 1 tablet (4 mg total) by mouth every 4 (four) hours as needed for nausea. 90 tablet 3   potassium chloride SA (KLOR-CON M) 20 MEQ tablet Take 20 mEq by mouth daily.       prochlorperazine (COMPAZINE) 10 MG tablet Take 1 tablet (10 mg total) by mouth every 6 (six) hours as needed for nausea or vomiting. 30 tablet 2   vitamin E 180 MG (400 UNITS) capsule Take 400 Units by mouth daily. (Patient not taking: Reported on 01/17/2022)        REVIEW OF SYSTEMS:  Review of Systems  Constitutional: Negative.  Negative for appetite change, chills, diaphoresis, fatigue, fever and unexpected weight change.  HENT:  Negative.  Negative for  hearing loss, lump/mass, mouth sores, nosebleeds, sore throat, tinnitus, trouble swallowing and voice change.   Eyes: Negative.  Negative for eye problems and icterus.  Respiratory:  Positive for cough (occasionally productive with clear sputum) and wheezing (occasional). Negative for chest tightness, hemoptysis and shortness of breath.   Cardiovascular: Negative.  Negative for chest pain, leg swelling and palpitations.  Gastrointestinal: Negative.  Negative for abdominal distention, abdominal pain, blood in stool, constipation, diarrhea, nausea, rectal pain and vomiting.  Endocrine: Negative.   Genitourinary: Negative.  Negative for bladder incontinence, difficulty urinating, dyspareunia, dysuria, frequency, hematuria, menstrual problem, nocturia, pelvic pain, vaginal bleeding and vaginal discharge.   Musculoskeletal: Negative.  Negative for arthralgias, back pain, flank pain, gait problem, myalgias, neck pain and neck stiffness.       Left lateral thorax pain which is pleuritic in nature.  Skin: Negative.  Negative for itching, rash and wound.  Neurological:  Negative for dizziness, extremity weakness, gait problem, headaches, light-headedness, numbness, seizures and speech difficulty.  Hematological: Negative.  Negative for adenopathy. Does not bruise/bleed easily.  Psychiatric/Behavioral: Negative.  Negative for confusion,  decreased concentration, depression, sleep disturbance and suicidal ideas. The patient is not nervous/anxious.        Dissociative identity disorder   VITALS:  Blood pressure 126/87, pulse 65, temperature 98 F (36.7 C), temperature source Oral, resp. rate 17, height 4\' 11"  (1.499 m), weight 132 lb 4.8 oz (60 kg), SpO2 99 %.     Wt Readings from Last 3 Encounters:  03/04/22 131 lb (59.4 kg)  02/28/22 132 lb 4.8 oz (60 kg)  02/07/22 137 lb 3.2 oz (62.2 kg)    Body mass index is 26.72 kg/m.   Performance status (ECOG): 1 - Symptomatic but completely ambulatory    PHYSICAL EXAM:  Physical Exam Vitals and nursing note reviewed.  Constitutional:      General: She is not in acute distress.    Appearance: Normal appearance. She is normal weight. She is not ill-appearing, toxic-appearing or diaphoretic.  HENT:     Head: Normocephalic and atraumatic.     Right Ear: Tympanic membrane, ear canal and external ear normal. There is no impacted cerumen.     Left Ear: Tympanic membrane, ear canal and external ear normal. There is no impacted cerumen.     Nose: Nose normal. No congestion or rhinorrhea.     Mouth/Throat:     Mouth: Mucous membranes are moist.     Pharynx: Oropharynx is clear. No oropharyngeal exudate or posterior oropharyngeal erythema.  Eyes:     General: No scleral icterus.       Right eye: No discharge.        Left eye: No discharge.     Extraocular Movements: Extraocular movements intact.     Conjunctiva/sclera: Conjunctivae normal.     Pupils: Pupils are equal, round, and reactive to light.  Neck:     Vascular: No carotid bruit.  Cardiovascular:     Rate and Rhythm: Normal rate and regular rhythm.     Pulses: Normal pulses.     Heart sounds: Normal heart sounds. No murmur heard.    No friction rub. No gallop.  Pulmonary:     Effort: Pulmonary effort is normal. No respiratory distress.     Breath sounds: Normal breath sounds. No stridor. No wheezing, rhonchi or rales.  Chest:     Chest wall: No tenderness.  Abdominal:     General: Bowel sounds are normal. There is no distension.     Palpations: Abdomen is soft. There is no hepatomegaly, splenomegaly or mass.     Tenderness: There is no abdominal tenderness. There is no right CVA tenderness, left CVA tenderness, guarding or rebound.     Hernia: No hernia is present.  Musculoskeletal:        General: No swelling, tenderness, deformity or signs of injury. Normal range of motion.     Cervical back: Normal range of motion and neck supple. No rigidity or tenderness.     Right lower  leg: No edema.     Left lower leg: No edema.  Lymphadenopathy:     Cervical: No cervical adenopathy.     Right cervical: No superficial, deep or posterior cervical adenopathy.    Left cervical: No superficial, deep or posterior cervical adenopathy.     Upper Body:     Right upper body: No supraclavicular, axillary or pectoral adenopathy.     Left upper body: No supraclavicular, axillary or pectoral adenopathy.  Skin:    General: Skin is warm and dry.     Coloration: Skin is not jaundiced or  pale.     Findings: No bruising, erythema, lesion or rash.  Neurological:     General: No focal deficit present.     Mental Status: She is alert and oriented to person, place, and time. Mental status is at baseline.     Cranial Nerves: No cranial nerve deficit.     Sensory: No sensory deficit.     Motor: No weakness.     Coordination: Coordination normal.     Gait: Gait normal.     Deep Tendon Reflexes: Reflexes normal.  Psychiatric:        Mood and Affect: Mood normal.        Behavior: Behavior normal.        Thought Content: Thought content normal.        Judgment: Judgment normal.     LABS:        Latest Ref Rng & Units 02/28/2022   12:00 AM 02/07/2022   11:29 AM 01/17/2022   12:00 AM  CBC  WBC   10.6     8.2  11.5      Hemoglobin 12.0 - 16.0 12.5     13.0  13.6      Hematocrit 36 - 46 36     38.6  40      Platelets 150 - 400 K/uL 307     267  318         This result is from an external source.   Component Ref Range & Units 2 wk ago (04/11/22) 1 mo ago (03/19/22) 2 mo ago (02/28/22)  Sodium 135 - 145 mmol/L 140 138 R 141 R  Potassium 3.5 - 5.1 mmol/L 2.8 Low  3.2 Abnormal  R 3.3 Abnormal  R  Chloride 98 - 111 mmol/L 98 101 R 102 R  CO2 22 - 32 mmol/L 32 29 Abnormal  R 29 Abnormal  R  Glucose, Bld 70 - 99 mg/dL 91 161 R 096 R  BUN 8 - 23 mg/dL 8 12 R 15 R  Creatinine 0.44 - 1.00 mg/dL 0.45 High  0.9 R 1.0 R  Calcium 8.9 - 10.3 mg/dL 9.5    Total Protein 6.5 - 8.1 g/dL  7.8    Albumin 3.5 - 5.0 g/dL 4.4    AST 15 - 41 U/L 22    ALT 0 - 44 U/L 13    Alkaline Phosphatase 38 - 126 U/L 66    Total Bilirubin 0.3 - 1.2 mg/dL 1.4 High      Component Ref Range & Units 2 wk ago (04/11/22) 2 mo ago (02/28/22) 2 mo ago (02/07/22) 3 mo ago (01/17/22) 5 mo ago (11/19/21)  TSH 0.350 - 4.500 uIU/mL 16.977 High  47.710 High  CM 201.807 High  CM 221.434 High  CM 0.016 Low  CM          Component Ref Range & Units 2 wk ago (04/11/22) 2 mo ago (02/28/22) 2 mo ago (02/07/22) 3 mo ago (01/17/22) 5 mo ago (11/19/21)  T4, Total 4.5 - 12.0 ug/dL 9.0 8.5 CM 1.3 Low  CM <0.4 Low  CM 8.7 CM         Recent Labs       Lab Results  Component Value Date    CEA1 2.8 11/08/2021     /  Last Labs         CEA  Date Value Ref Range Status  11/08/2021 2.8 0.0 - 4.7 ng/mL Final      Comment:      (  NOTE)                             Nonsmokers          <3.9                             Smokers             <5.6 [employer] Electrochemiluminescence Immunoassay (ECLIA) Values obtained with different assay methods or kits cannot be used interchangeably.  Results cannot be interpreted as absolute evidence of the presence or absence of malignant disease. Performed At: [facility] [address] [place], [place] [id number] Jolene Schimke MD WU:[id number]        STUDIES:  Imaging Results  No results found.  EXAM:2/202/24 CT CHEST WITH CONTRAST IMPRESSION Numerous small scattered bilateral pulmonary nodules are all stable, including persistent stable lingular nodules that were new on her 11/23/2021 CT scan. The persistence of these nodules may indicate pulmonary metastases.  No new or progressive metastatic disease in the chest Trace basilar left pleural effusion decreased.  Aortic Atherosclerosis (ICD10-170.0) and Emphysema. (NFA21-H08.9)     I,Jasmine M Lassiter,acting as a scribe for Sydney Beckwith, MD.,have documented all relevant  documentation on the behalf of Sydney Beckwith, MD,as directed by  Sydney Beckwith, MD while in the presence of Sydney Beckwith, MD.

## 2022-05-02 ENCOUNTER — Encounter: Payer: Self-pay | Admitting: Oncology

## 2022-05-02 ENCOUNTER — Inpatient Hospital Stay: Payer: Medicare HMO

## 2022-05-02 ENCOUNTER — Other Ambulatory Visit: Payer: Self-pay | Admitting: Oncology

## 2022-05-02 ENCOUNTER — Inpatient Hospital Stay: Payer: Medicare HMO | Attending: Hematology and Oncology | Admitting: Oncology

## 2022-05-02 VITALS — BP 106/70 | HR 63 | Temp 98.6°F | Resp 18 | Ht 59.0 in | Wt 124.1 lb

## 2022-05-02 DIAGNOSIS — C3492 Malignant neoplasm of unspecified part of left bronchus or lung: Secondary | ICD-10-CM | POA: Diagnosis not present

## 2022-05-02 DIAGNOSIS — Z8719 Personal history of other diseases of the digestive system: Secondary | ICD-10-CM | POA: Diagnosis not present

## 2022-05-02 DIAGNOSIS — E876 Hypokalemia: Secondary | ICD-10-CM | POA: Diagnosis not present

## 2022-05-02 DIAGNOSIS — I700 Atherosclerosis of aorta: Secondary | ICD-10-CM | POA: Insufficient documentation

## 2022-05-02 DIAGNOSIS — F1290 Cannabis use, unspecified, uncomplicated: Secondary | ICD-10-CM | POA: Diagnosis not present

## 2022-05-02 DIAGNOSIS — R12 Heartburn: Secondary | ICD-10-CM

## 2022-05-02 DIAGNOSIS — Z885 Allergy status to narcotic agent status: Secondary | ICD-10-CM | POA: Diagnosis not present

## 2022-05-02 DIAGNOSIS — J90 Pleural effusion, not elsewhere classified: Secondary | ICD-10-CM | POA: Diagnosis not present

## 2022-05-02 DIAGNOSIS — Z90710 Acquired absence of both cervix and uterus: Secondary | ICD-10-CM | POA: Diagnosis not present

## 2022-05-02 DIAGNOSIS — K219 Gastro-esophageal reflux disease without esophagitis: Secondary | ICD-10-CM | POA: Diagnosis not present

## 2022-05-02 DIAGNOSIS — C3432 Malignant neoplasm of lower lobe, left bronchus or lung: Secondary | ICD-10-CM | POA: Insufficient documentation

## 2022-05-02 DIAGNOSIS — I10 Essential (primary) hypertension: Secondary | ICD-10-CM | POA: Diagnosis not present

## 2022-05-02 DIAGNOSIS — Z79890 Hormone replacement therapy (postmenopausal): Secondary | ICD-10-CM | POA: Diagnosis not present

## 2022-05-02 DIAGNOSIS — Z79620 Long term (current) use of immunosuppressive biologic: Secondary | ICD-10-CM | POA: Insufficient documentation

## 2022-05-02 DIAGNOSIS — Z87891 Personal history of nicotine dependence: Secondary | ICD-10-CM | POA: Insufficient documentation

## 2022-05-02 DIAGNOSIS — J439 Emphysema, unspecified: Secondary | ICD-10-CM | POA: Diagnosis not present

## 2022-05-02 DIAGNOSIS — E032 Hypothyroidism due to medicaments and other exogenous substances: Secondary | ICD-10-CM | POA: Diagnosis not present

## 2022-05-02 DIAGNOSIS — Z90722 Acquired absence of ovaries, bilateral: Secondary | ICD-10-CM | POA: Diagnosis not present

## 2022-05-02 DIAGNOSIS — Z79899 Other long term (current) drug therapy: Secondary | ICD-10-CM | POA: Diagnosis not present

## 2022-05-02 DIAGNOSIS — Z5112 Encounter for antineoplastic immunotherapy: Secondary | ICD-10-CM | POA: Insufficient documentation

## 2022-05-02 LAB — CBC AND DIFFERENTIAL
HCT: 40 (ref 36–46)
Hemoglobin: 14 (ref 12.0–16.0)
Neutrophils Absolute: 5.54
Platelets: 326 10*3/uL (ref 150–400)
WBC: 9.9

## 2022-05-02 LAB — HEPATIC FUNCTION PANEL
ALT: 13 U/L (ref 7–35)
AST: 28 (ref 13–35)
Alkaline Phosphatase: 63 (ref 25–125)
Bilirubin, Total: 0.9

## 2022-05-02 LAB — TSH: TSH: 9.734 u[IU]/mL — ABNORMAL HIGH (ref 0.350–4.500)

## 2022-05-02 LAB — BASIC METABOLIC PANEL
BUN: 11 (ref 4–21)
CO2: 30 — AB (ref 13–22)
Chloride: 101 (ref 99–108)
Creatinine: 0.9 (ref 0.5–1.1)
Glucose: 90
Potassium: 3 mEq/L — AB (ref 3.5–5.1)
Sodium: 140 (ref 137–147)

## 2022-05-02 LAB — COMPREHENSIVE METABOLIC PANEL
Albumin: 4.2 (ref 3.5–5.0)
Calcium: 9.6 (ref 8.7–10.7)

## 2022-05-02 LAB — COMPREHENSIVE METABOLIC PANEL (ASHBORO CC SCANNED REPORT)

## 2022-05-02 LAB — CBC W DIFFERENTIAL (~~LOC~~ CC SCANNED REPORT)

## 2022-05-02 LAB — CBC: RBC: 4.23 (ref 3.87–5.11)

## 2022-05-02 MED ORDER — POTASSIUM CHLORIDE CRYS ER 20 MEQ PO TBCR: 60.0000 meq | EXTENDED_RELEASE_TABLET | Freq: Two times a day (BID) | ORAL | 3 refills | Status: DC

## 2022-05-02 MED ORDER — OMEPRAZOLE 40 MG PO CPDR: 40.0000 mg | DELAYED_RELEASE_CAPSULE | Freq: Two times a day (BID) | ORAL | 5 refills | Status: DC

## 2022-05-03 ENCOUNTER — Telehealth: Payer: Self-pay

## 2022-05-03 ENCOUNTER — Other Ambulatory Visit: Payer: Self-pay

## 2022-05-03 LAB — T4: T4, Total: 9 ug/dL (ref 4.5–12.0)

## 2022-05-03 MED FILL — Pembrolizumab IV Soln 100 MG/4ML (25 MG/ML): INTRAVENOUS | Qty: 8 | Status: AC

## 2022-05-03 NOTE — Telephone Encounter (Signed)
-----   Message from Jeannette Corpus, LPN sent at 09/05/2798  7:31 AM EDT ----- Regarding: FW: K  ----- Message ----- From: Domenic Schwab, [facility] Sent: 05/02/2022   4:44 PM EDT To: Dellia Beckwith, MD; Jeannette Corpus, LPN Subject: RE: K                                          She can be non-compliant and has refused the IV potassium in the past.  If she will stay, we would be glad to give her some.  Marylene Land - will you call her and see what she says?  ----- Message ----- From: Dellia Beckwith, MD Sent: 05/02/2022   1:24 PM EDT To: Domenic Schwab, RPH; Jeannette Corpus, LPN Subject: K                                              What do you think about IV K, ie.20 meq? It was 2.8 last time and I told her to double her supplement from 1 bid to 2 bid. Only 3.0 now and so I told her 3 bid but don't know how compliant she would be.  I didn't discuss with her so we would need to see if she is willing to stay the extra time for it

## 2022-05-03 NOTE — Telephone Encounter (Signed)
Patient voiced she started taking three pills twice a day yesterday and will continue with this. She declined any IV potassium.

## 2022-05-06 ENCOUNTER — Inpatient Hospital Stay: Payer: Medicare HMO

## 2022-05-06 VITALS — BP 118/66 | HR 71 | Temp 98.6°F | Resp 18 | Ht 59.0 in | Wt 121.1 lb

## 2022-05-06 DIAGNOSIS — C3492 Malignant neoplasm of unspecified part of left bronchus or lung: Secondary | ICD-10-CM

## 2022-05-06 DIAGNOSIS — Z5112 Encounter for antineoplastic immunotherapy: Secondary | ICD-10-CM | POA: Diagnosis not present

## 2022-05-06 MED ORDER — SODIUM CHLORIDE 0.9 % IV SOLN
200.0000 mg | Freq: Once | INTRAVENOUS | Status: AC
  Administered 2022-05-06: 200 mg via INTRAVENOUS
  Filled 2022-05-06: qty 8

## 2022-05-06 MED ORDER — SODIUM CHLORIDE 0.9% FLUSH
10.0000 mL | INTRAVENOUS | Status: DC | PRN
  Administered 2022-05-06: 10 mL

## 2022-05-06 MED ORDER — SODIUM CHLORIDE 0.9 % IV SOLN: Freq: Once | INTRAVENOUS | Status: AC

## 2022-05-06 MED ORDER — HEPARIN SOD (PORK) LOCK FLUSH 100 UNIT/ML IV SOLN
500.0000 [IU] | Freq: Once | INTRAVENOUS | Status: AC | PRN
  Administered 2022-05-06: 500 [IU]

## 2022-05-06 NOTE — Patient Instructions (Signed)

## 2022-05-08 ENCOUNTER — Encounter: Payer: Self-pay | Admitting: Oncology

## 2022-05-08 NOTE — Progress Notes (Signed)
 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 This was initially found on lung cancer screening CT in September 2022. Most recent CT imaging revealed the solitary left lower lobe nodule to be enlarging, measuring 1.2 cm in September and now measuring 1.4 x 1.9 cm.  She initially met with Dr. Lightfoot for consultation, and he recommended SBRT as he felt that her smoking would cause excessive risk for postoperative complications.  She sought another opinion with  Dr. Kamran Hunt in [place] who agreed to her surgical management. Her daughter lives in [place] and will be her support system.  She has now had her surgery on April 12 with left thoracotomy and left lower lobe lobectomy along with mediastinal lymph node dissection.  Pathology revealed an adenocarcinoma grade 2 measuring 2.5 cm in diameter but with no evidence of visceral pleural invasion or direct invasion of adjacent structures.  All margins are negative for invasive carcinoma.  She did have involvement of 2 out of 20 lymph nodes for a T1c N1 M0, stage IIB.  EGFR and ALK mutations were negative but PD-L1 was 90%.  Her doctor there recommended chemotherapy with immunotherapy but she refused chemotherapy.  She was only willing to take the immunotherapy for 1 year and had a port placed.  She had her first dose on September 7 and her second dose on September 28.  She has now relocated to [place] and we have continued this regimen, and plan 1 year of treatment.     Tobacco abuse.  She has not smoked at all since April 12 when she had her surgery. "She fell off the wagon" recently, but has stopped again.    3.    Dissociative identity disorder with 3 personalities. She continues to follow with a psychiatrist.     4.    Hypokalemia.  Potassium was down to 2.7 last month and so she Hunt on supplement now twice daily. We will now increase this to three times daily since it Hunt only 3.2.     Plan: She will continue with treatment , I will plan to see her back in 3 weeks with  CBC and CMP. She asked to have some time either before her grandson's graduation on May 18th or a week after to receive treatment.  We looked over the schedule and agreed we can postpone her May treatment to either the 23rd or 24th. Later we will need to arrange for annual mammography and colonoscopy when she Hunt due, since she does have a history of colon polyps.  She understands and agrees with this plan of care. I have answered her questions and she knows to call with any concerns.      I provided 20 minutes of face-to-face time during this this encounter and > 50% was spent counseling as documented under my assessment and plan.    ADDENDUM: Her potassium Hunt even lower at 2.8 and her compliance Hunt in doubt.  I have recommended that she increase her dose to 6 daily, i.e. 3 twice daily and would even recommend IV potassium but she has refused that before.  The thyroid looks good and so she will stay on the same dose.  I notified her of these   abnormalities.     Sydney Hunt Sydney Hunt [facility]  [facility] [address] [place] [address] Dept: [phone] Dept Fax: [phone]      CHIEF COMPLAINT:  CC: Adenocarcinoma of the left lung   Current Treatment:  Postoperative adjuvant immunotherapy     HISTORY OF PRESENT ILLNESS:  Sydney Hunt a 61 y.o. female with a history of tobacco abuse who has adenocarcinoma of the left lung. This was originally found on lung cancer screening CT from September 2022. PET imaging was obtained in October which revealed a solitary 1.25 cm left lower lobe pulmonary nodule to be hypermetabolic and consistent with small primary lung  neoplasm. No findings for mediastinal or hilar adenopathy or metastatic disease elsewhere was observed. She met with the genetic counselor and [facility] diagnostic testing was negative for any clinically significant mutations. CT chest from December revealed enlarging left lower lobe nodule, measuring 1.4 x 1.9 cm. The additional smaller pulmonary nodules remained unchanged. She was referred to Dr. Robert Hunt and underwent bronchoscopy on December 19th and cytology from this procedure confirmed malignant cells consistent with adenocarcioma. Immunohistochemical stains were positive for TTF-1, and negative for p63 and CK5/6. 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 Her FEV1 was 1.98, which Hunt 88% of predicted and her FVC was 2.58, which Hunt 89% of predicted. She was deemed a candidate for resection and met with Dr. Lightfoot for consultation. As she Hunt not motivated to quit smoking, he felt that surgery would present an excessive risk for postoperative respiratory complications. He recommended SBRT as a good option if the patient was unable to quit smoking.  The patient went for a second opinion to Dr. Kamran Hunt and surgical resection was carried out on April 12 with a left lower lobe lobectomy.  She was hospitalized for 13 days and has not smoked since that time.  Pathology revealed a 2.5 cm adenocarcinoma, grade 2.  There was no evidence of pleural invasion and margins are clear but she tells me 2 of 20 nodes were positive, for a T1c N1 M0, stage IIB.  A port was placed on August 16 and she was started on pembrolizumab on September 7 and had her second dose on September 28.   INTERVAL HISTORY:  I have reviewed her chart and materials related to her cancer extensively and collaborated history with the patient. Summary of oncologic history Hunt as follows:    Oncology History  Non-small cell lung cancer, left (HCC)  01/15/2021 Initial Diagnosis    Non-small cell lung cancer, left  (HCC)    02/12/2021 Cancer Staging    Staging form: Lung, AJCC 8th Edition - Clinical stage from 02/12/2021: Stage IA2 (cT1b, cN0, cM0) - Signed by [name] Hunt, Hunt on 02/14/2021 Histopathologic type: Adenocarcinoma, NOS Stage prefix: Initial diagnosis Histologic grade (G): GX Histologic grading system: 4 grade system Laterality: Left Tumor size (mm): 19 Lymph-vascular invasion (LVI): LVI not present (absent)/not identified Diagnostic confirmation: Positive histology Specimen type: Bronchial Biopsy Staged by: Managing physician Stage used in treatment planning: Yes National guidelines used in treatment planning: Yes Type of national guideline used in treatment planning: NCCN Staging comments: Rec surgical resection    05/11/2021 Cancer Staging    Staging form: Lung, AJCC 8th Edition - Pathologic stage from 05/11/2021: Stage IIB (ypT1c, pN1, cM0) - Signed by Jansel Vonstein Hunt, Hunt on 11/12/2021 Histopathologic type: Adenocarcinoma, NOS Stage prefix: Post-therapy Histologic grade (G): G2 Histologic grading   system: 4 grade system Residual tumor (R): R0 - None Laterality: Left Tumor size (mm): 25 Lymph-vascular invasion (LVI): LVI not present (absent)/not identified Diagnostic confirmation: Positive histology PLUS positive immunophenotyping and/or positive genetic studies Specimen type: Excision Staged by: Managing physician Type of lung cancer: Surgically resected non-small cell lung cancer ECOG performance status: Grade 1 Perineural invasion (PNI): Absent Pleural/elastic layer invasion: PL0 Pleural lavage cytology: Unknown Weight loss: Absent Adequacy of mediastinal dissection: Adequate Radiotherapy dose: No Adjuvant radiation: No Adjuvant chemotherapy: Yes Stage used in treatment planning: Yes National guidelines used in treatment planning: Yes Type of national guideline used in treatment planning: NCCN Staging comments: Adjuvant pembrolizumab, PDL 1 90%     10/04/2021 -  Chemotherapy    Patient Hunt on Treatment Plan : LUNG NSCLC Pembrolizumab Adj q21d x 18 cycles     Interval History   Sydney Hunt here today for a routine follow up prior to her 9th cycle of Pembrolizumab. She notes that she has been experiencing Nose itch along with her eyes and has asked for any suggestions of what she cant take over the counter to help relieve this. I have suggested Zyrtec or any nasal spray. She states she has stopped smoking and continues to take holistic therapeutic methods during this time. She notes that treatment day she has no complaints, the day after she feels the most tired, and usual;ly by day 3 she Hunt slowly getting back . She continues taking 3 potassium supplements a day. She denies fever, chills or other signs of infection.  She denies vomiting or abdominal pain.  She denies sore throat, or chest pain. Her weight Hunt down 2 pounds since her last visit, her appetite Hunt fair, better than before.  Her grandson will be graduating and May and so she Hunt asked to postpone that treatment.     HISTORY:        Past Medical History:  Diagnosis Date   Allergy     Arthritis     Complication of anesthesia      slow to wake up   COPD (chronic obstructive pulmonary disease) (HCC)     COPD, mild (HCC) 12/15/2020   Family history of pancreatic cancer 11/28/2020   Family history of prostate cancer 11/28/2020   Family history of stomach cancer 11/28/2020   Genetic testing 12/18/2020    [facility] Multi-Cancer Panel was Negative. Report date Hunt 12/12/2020.     The Multi-Cancer + RNA Panel offered by [facility] includes sequencing and/or deletion/duplication analysis of the following 84 genes:  AIP*, ALK, APC*, ATM*, AXIN2*, BAP1*, BARD1*, BLM*, BMPR1A*, BRCA1*, BRCA2*, BRIP1*, CASR, CDC73*, CDH1*, CDK4, CDKN1B*, CDKN1C*, CDKN2A, CEBPA, CHEK2*, CTNNA1*, DICER1*, DIS3L2*, EGFR, EPCAM, FH   GERD (gastroesophageal reflux disease)     Heartburn 12/27/2021   Hormone disorder  11/20/2020   Hypertension     Hypokalemia 12/27/2021   Leukocytosis 01/17/2022   Migraines     Nodule of left lung 11/21/2020   Non-small cell lung cancer, left (HCC) 01/15/2021   Tobacco use 12/07/2020           Past Surgical History:  Procedure Laterality Date   ABDOMINAL HYSTERECTOMY   1989    unsure of vaginal or abdominal   BRONCHIAL BIOPSY   01/15/2021    Procedure: BRONCHIAL BIOPSIES;  Surgeon: Hunt, Sydney S, Hunt;  Location: [facility];  Service: Pulmonary;;   BRONCHIAL BRUSHINGS   01/15/2021    Procedure: BRONCHIAL BRUSHINGS;  Surgeon: Hunt, Sydney S, Hunt;  Location: [facility];    Service: Pulmonary;;   BRONCHIAL NEEDLE ASPIRATION BIOPSY   01/15/2021    Procedure: BRONCHIAL NEEDLE ASPIRATION BIOPSIES;  Surgeon: Hunt, Sydney S, Hunt;  Location: [facility];  Service: Pulmonary;;   COLONOSCOPY   2022   FIDUCIAL MARKER PLACEMENT   01/15/2021    Procedure: FIDUCIAL MARKER PLACEMENT;  Surgeon: Hunt, Sydney S, Hunt;  Location: [facility];  Service: Pulmonary;;   ovaries removed   1986   surgery to remove scar tissue        from colon/bladder in patient's late 30's   TUBAL LIGATION   1986   UPPER GI ENDOSCOPY        years ago in patient's late 20s   VIDEO BRONCHOSCOPY WITH RADIAL ENDOBRONCHIAL ULTRASOUND   01/15/2021    Procedure: VIDEO BRONCHOSCOPY WITH RADIAL ENDOBRONCHIAL ULTRASOUND;  Surgeon: Hunt, Sydney S, Hunt;  Location: [facility];  Service: Pulmonary;;           Family History  Problem Relation Age of Onset   Pancreatic cancer Maternal Aunt 50   Brain cancer Maternal Uncle     Stomach cancer Maternal Uncle          dx. >50   Prostate cancer Half-Brother          passed away at 70   Prostate cancer Half-Brother        Social History:  reports that she quit smoking about 10 months ago. Her smoking use included cigarettes. She started smoking about 61 years ago. She has a 29.50 pack-year smoking history. She has never used smokeless tobacco. She reports  current alcohol use of about 4.0 standard drinks of alcohol per week. She reports current drug use. Frequency: 3.00 times per week. Drug: Marijuana. She Hunt widowed and lives at home.  She has quit smoking since April 12, when she had her surgical resection.  She has 2 biological children and 1 stepchild. She Hunt disabled, and has been exposed to chemicals or other toxic agents. She was previously employed at a hosiery, chicken farm and cleaning houses. 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   Allergies:       Allergies  Allergen Reactions   Codeine Rash   Demerol [Meperidine Hcl] Other (See Comments)      coma   Other Dermatitis      Stainless steel / skin weeps   Silver Dermatitis      Sterling silver skin weeps   Gold Bond [Menthol-Zinc Oxide] Dermatitis      Skin weeps   Neosporin [Bacitracin-Polymyxin B] Dermatitis      Skin weeps   Wound Dressing Adhesive Other (See Comments)      CAUSES SKIN TEARS PER PATIENT. INSTEAD USE THE TEGADERM 3M WITH ADHESIVE FREE WINDOW OVER PORT.      Current Medications:       Current Outpatient Medications  Medication Sig Dispense Refill   albuterol (VENTOLIN HFA) 108 (90 Base) MCG/ACT inhaler Inhale 2 puffs into the lungs every 4 (four) hours as needed for wheezing or shortness of breath.       amitriptyline (ELAVIL) 10 MG tablet Take 10 mg by mouth at bedtime as needed for sleep.       Ascorbic Acid (VITAMIN C WITH ROSE HIPS) 500 MG tablet Take 500 mg by mouth daily.       aspirin 81 MG EC tablet Take 81 mg by mouth daily.       atorvastatin (LIPITOR) 40 MG tablet Take 40 mg by   mouth daily.       b complex vitamins capsule Take 1 capsule by mouth daily. (Patient not taking: Reported on 01/17/2022)       Biotin 5000 MCG CAPS Take 5,000 mcg by mouth daily.       Calcium-Vitamin D-Vitamin K (CHEWABLE CALCIUM PO) Take 650 mg by mouth daily.       celecoxib (CELEBREX) 200 MG capsule Take 200 mg by mouth daily.        fluconazole (DIFLUCAN) 100 MG tablet Take 1 tablet (100 mg total) by mouth daily. 10 tablet 0   gabapentin (NEURONTIN) 300 MG capsule Take 300 mg by mouth at bedtime.       hydrochlorothiazide (HYDRODIURIL) 25 MG tablet Take 25 mg by mouth every morning.       HYDROcodone-acetaminophen (NORCO/VICODIN) 5-325 MG tablet Take 1 tablet by mouth every 6 (six) hours as needed for moderate pain or severe pain. 30 tablet 0   levothyroxine (SYNTHROID) 50 MCG tablet Take 1 tablet (50 mcg total) by mouth daily before breakfast. 30 tablet 2   lidocaine-prilocaine (EMLA) cream Apply 1 Application topically as needed. 30 g 0   omeprazole (PRILOSEC) 40 MG capsule Take 1 capsule (40 mg total) by mouth daily. 30 capsule 2   ondansetron (ZOFRAN) 4 MG tablet Take 1 tablet (4 mg total) by mouth every 4 (four) hours as needed for nausea. 90 tablet 3   potassium chloride SA (KLOR-CON M) 20 MEQ tablet Take 20 mEq by mouth daily.       prochlorperazine (COMPAZINE) 10 MG tablet Take 1 tablet (10 mg total) by mouth every 6 (six) hours as needed for nausea or vomiting. 30 tablet 2   vitamin E 180 MG (400 UNITS) capsule Take 400 Units by mouth daily. (Patient not taking: Reported on 01/17/2022)        No current facility-administered medications for this visit.      REVIEW OF SYSTEMS:  Review of Systems  Constitutional: Negative.  Negative for appetite change, chills, fatigue, fever and unexpected weight change.  HENT:  Negative.    Eyes: Negative.   Respiratory:  Negative for chest tightness, cough (occasionally productive with clear sputum), hemoptysis and shortness of breath. Wheezing: occasional.  Cardiovascular: Negative.  Negative for chest pain, leg swelling and palpitations.  Gastrointestinal: Negative.  Negative for abdominal distention, abdominal pain, blood in stool, constipation, diarrhea, nausea and vomiting.  Endocrine: Negative.   Genitourinary: Negative.  Negative for difficulty urinating, dysuria,  frequency and hematuria.   Musculoskeletal: Negative.  Negative for arthralgias, back pain, flank pain, gait problem and myalgias.  Skin: Negative.   Neurological: Negative.  Negative for dizziness, extremity weakness, gait problem, headaches, light-headedness, numbness, seizures and speech difficulty.  Hematological: Negative.   Psychiatric/Behavioral: Negative.  Negative for depression and sleep disturbance. The patient Hunt not nervous/anxious.        Dissociative identity disorder        VITALS:  Blood pressure 126/87, pulse 65, temperature 98 F (36.7 C), temperature source Oral, resp. rate 17, height 4' 11" (1.499 m), weight 132 lb 4.8 oz (60 kg), SpO2 99 %.     Wt Readings from Last 3 Encounters:  03/04/22 131 lb (59.4 kg)  02/28/22 132 lb 4.8 oz (60 kg)  02/07/22 137 lb 3.2 oz (62.2 kg)    Body mass index Hunt 26.72 kg/m.   Performance status (ECOG): 1 - Symptomatic but completely ambulatory   PHYSICAL EXAM:  Physical Exam Constitutional:        General: She Hunt not in acute distress.    Appearance: Normal appearance. She Hunt normal weight.  HENT:     Head: Normocephalic and atraumatic.  Eyes:     General: No scleral icterus.    Extraocular Movements: Extraocular movements intact.     Conjunctiva/sclera: Conjunctivae normal.     Pupils: Pupils are equal, round, and reactive to light.  Cardiovascular:     Rate and Rhythm: Normal rate and regular rhythm.     Pulses: Normal pulses.     Heart sounds: Normal heart sounds. No murmur heard.    No friction rub. No gallop.  Pulmonary:     Effort: Pulmonary effort Hunt normal. No respiratory distress.     Breath sounds: Normal breath sounds.  Abdominal:     General: Bowel sounds are normal. There Hunt no distension.     Palpations: Abdomen Hunt soft. There Hunt no hepatomegaly, splenomegaly or mass.     Tenderness: There Hunt no abdominal tenderness.  Musculoskeletal:        General: Normal range of motion.     Cervical back: Normal  range of motion and neck supple.     Right lower leg: No edema.     Left lower leg: No edema.  Lymphadenopathy:     Cervical: No cervical adenopathy.  Skin:    General: Skin Hunt warm and dry.  Neurological:     General: No focal deficit present.     Mental Status: She Hunt alert and oriented to person, place, and time. Mental status Hunt at baseline.  Psychiatric:        Mood and Affect: Mood normal.        Behavior: Behavior normal.        Thought Content: Thought content normal.        Judgment: Judgment normal.          LABS:        Latest Ref Rng & Units 02/28/2022   12:00 AM 02/07/2022   11:29 AM 01/17/2022   12:00 AM  CBC  WBC   10.6     8.2  11.5      Hemoglobin 12.0 - 16.0 12.5     13.0  13.6      Hematocrit 36 - 46 36     38.6  40      Platelets 150 - 400 K/uL 307     267  318         This result Hunt from an external source.        Latest Ref Rng & Units 02/28/2022   12:00 AM 02/07/2022   11:29 AM 01/17/2022    9:41 AM  CMP  Glucose 70 - 99 mg/dL   94  74   BUN 4 - 21 15     8  12   Creatinine 0.5 - 1.1 1.0     1.21  1.26   Sodium 137 - 147 141     136  141   Potassium 3.5 - 5.1 mEq/L 3.3     2.7  3.2   Chloride 99 - 108 102     96  103   CO2 13 - 22 29     30  29   Calcium 8.7 - 10.7 9.6     9.3  10.0   Total Protein 6.5 - 8.1 g/dL   7.8  8.1   Total Bilirubin 0.3 - 1.2 mg/dL   1.1  0.9     Alkaline Phos 25 - 125 69     56  56   AST 13 - 35 33     62  35   ALT 7 - 35 U/L 18     25  19      This result Hunt from an external source.        Recent Labs       Lab Results  Component Value Date    CEA1 2.8 11/08/2021     /  Last Labs         CEA  Date Value Ref Range Status  11/08/2021 2.8 0.0 - 4.7 ng/mL Final      Comment:      (NOTE)                             Nonsmokers          <3.9                             Smokers             <5.6 Roche [employer] Electrochemiluminescence Immunoassay (ECLIA) Values obtained with different assay methods or  kits cannot be used interchangeably.  Results cannot be interpreted as absolute evidence of the presence or absence of malignant disease. Performed At: [facility] [address] [place], [place] [id number] Nagendra Sanjai Hunt Ph:[phone]            STUDIES:  Imaging Results  No results found.  EXAM:2/202/24 CT CHEST WITH CONTRAST IMPRESSION Numerous small scattered bilateral pulmonary nodules are all stable, including persistent stable lingular nodules that were new on her October Ct=T scan, which may indicate pulmonary metastases.  No new or progressive metastatic disease in the chest Trace basilar left pleural effusion decreased.  Aortic Atherosclerosis and Emphysema.     EXAM: 10/13/2020 CT CHEST WITHOUT CONTRAST LOW-DOSE FOR LUNG CANCER SCREENING    TECHNIQUE:  Multidetector CT imaging of the chest was performed following the  standard protocol without IV contrast.    COMPARISON: None.    FINDINGS:  Cardiovascular: Atherosclerotic calcification of the aorta. Heart  size normal. No pericardial effusion.  Mediastinum/Nodes: No pathologically enlarged mediastinal or  axillary lymph nodes. Hilar regions are difficult to definitively  evaluate without IV contrast but appear grossly unremarkable. There  may be distal esophageal wall thickening which can be seen with  gastroesophageal reflux.  Lungs/Pleura: Mild biapical pleuroparenchymal scarring.  Centrilobular and paraseptal emphysema. Smoking related respiratory  bronchiolitis. 11.2 mm peripheral left lower lobe nodule (4/178) has  somewhat lobulated borders. Other pulmonary nodules measure 5.3 mm  or less in size. No pleural fluid. Airway Hunt unremarkable.  Upper Abdomen: Visualized portions of the liver, gallbladder,  adrenal glands, kidneys, spleen, pancreas, stomach and bowel are  grossly unremarkable.  Musculoskeletal: No worrisome lytic or sclerotic lesions.    IMPRESSION:  1. Lung-RADS 4A,  suspicious. Follow up low-dose chest CT without  contrast in 3 months (please use the following order, "CT CHEST LCS  NODULE FOLLOW-UP W/O CM") Hunt recommended. Alternatively, PET may be  considered when there Hunt a solid component 8mm or larger. 11.2 mm  peripheral left lower lobe nodule. These results will be called to  the ordering clinician or representative by the Radiologist  Assistant, and communication documented in the PACS or [facility]  Dashboard.  2. Aortic atherosclerosis (ICD10-I70.0).  3. Emphysema (ICD10-J43.9).      EXAM: 11/01/2020 NUCLEAR MEDICINE PET SKULL BASE TO THIGH   TECHNIQUE: 6.51 mCi F-18 FDG was injected intravenously. Full-ring PET imaging was performed from the skull base to thigh after the radiotracer. CT data was obtained and used for attenuation correction and anatomic localization.   Fasting blood glucose: 103 mg/dl   COMPARISON:  Chest CT 10/13/2020   FINDINGS: Mediastinal blood pool activity: SUV max 2.13   Liver activity: SUV max NA   NECK: No hypermetabolic lymph nodes in the neck.   Incidental CT findings: none   CHEST: 12.5 mm lobulated left lower lobe subpleural pulmonary nodule Hunt hypermetabolic with SUV max of 5.65 and consistent with primary lung neoplasm.   No enlarged or hypermetabolic mediastinal or hilar lymph nodes to suggest metastatic adenopathy. No supraclavicular or axillary adenopathy.   No other pulmonary lesions.   Incidental CT findings: Stable underlying emphysematous changes and stable vascular calcifications.   ABDOMEN/PELVIS: No abnormal hypermetabolic activity within the liver, pancreas, adrenal glands, or spleen. No hypermetabolic lymph nodes in the abdomen or pelvis.   Incidental CT findings: Scattered atherosclerotic calcifications involving the aorta and iliac arteries.   SKELETON: No significant bony findings. No evidence of metastatic disease.   Incidental CT findings: none   IMPRESSION: 1.  Solitary 12.5 mm left lower lobe pulmonary nodule Hunt hypermetabolic and consistent with small primary lung neoplasm. 2. No findings for mediastinal or hilar adenopathy or metastatic disease elsewhere.   FINAL MICROSCOPIC DIAGNOSIS: 01/15/2021  A. LUNG, LLL, BRUSH:  - Atypical cells present   B. LUNG, LLL, FINE NEEDLE ASPIRATION:  - Malignant cells consistent with adenocarcinoma, see comment   COMMENT:   B.   Immunohistochemical stains show that the tumor cells are positive  for TTF-1 while they are negative for p63 and CK5/6, consistent with  above interpretation.  Dr. Patrick reviewed the case and concurs with  the diagnosis.  Dr. Byrum was notified on 01/19/2021.        I,Gabriella Ballesteros,acting as a scribe for Cornelious Diven Hunt Etter Royall, Hunt.,have documented all relevant documentation on the behalf of Cathye Kreiter Hunt Tyiesha Brackney, Hunt,as directed by  Jadakiss Barish Hunt Harryette Shuart, Hunt while in the presence of Prentis Langdon Hunt Sontee Desena, Hunt.   

## 2022-05-13 ENCOUNTER — Other Ambulatory Visit: Payer: Self-pay | Admitting: Hematology and Oncology

## 2022-05-13 DIAGNOSIS — R112 Nausea with vomiting, unspecified: Secondary | ICD-10-CM

## 2022-05-14 LAB — BASIC METABOLIC PANEL
Creatinine: 1 (ref 0.5–1.1)
Glucose: 105
Potassium: 3 mEq/L — AB (ref 3.5–5.1)
Sodium: 139 (ref 137–147)

## 2022-05-14 LAB — CBC AND DIFFERENTIAL: WBC: 8.6

## 2022-05-14 LAB — COMPREHENSIVE METABOLIC PANEL: Albumin: 4.6 (ref 3.5–5.0)

## 2022-05-16 LAB — LIPID PANEL
Cholesterol: 144 (ref 0–200)
HDL: 42 (ref 35–70)
LDL Cholesterol: 24
Triglycerides: 134 (ref 40–160)

## 2022-05-16 LAB — CBC AND DIFFERENTIAL
HCT: 44 (ref 36–46)
Hemoglobin: 14.7 (ref 12.0–16.0)
Neutrophils Absolute: 4.7
Platelets: 305 10*3/uL (ref 150–400)

## 2022-05-16 LAB — HEPATIC FUNCTION PANEL
ALT: 9 U/L (ref 7–35)
AST: 16 (ref 13–35)
Alkaline Phosphatase: 85 (ref 25–125)
Bilirubin, Total: 0.9

## 2022-05-16 LAB — BASIC METABOLIC PANEL
BUN: 10 (ref 4–21)
CO2: 30 — AB (ref 13–22)
Chloride: 94 — AB (ref 99–108)

## 2022-05-16 LAB — COMPREHENSIVE METABOLIC PANEL: Calcium: 10.1 (ref 8.7–10.7)

## 2022-05-16 LAB — CBC: RBC: 4.44 (ref 3.87–5.11)

## 2022-05-22 ENCOUNTER — Encounter: Payer: Self-pay | Admitting: Oncology

## 2022-05-23 ENCOUNTER — Telehealth: Payer: Self-pay

## 2022-05-23 ENCOUNTER — Inpatient Hospital Stay (INDEPENDENT_AMBULATORY_CARE_PROVIDER_SITE_OTHER): Payer: Medicare HMO | Admitting: Hematology and Oncology

## 2022-05-23 ENCOUNTER — Encounter: Payer: Self-pay | Admitting: Hematology and Oncology

## 2022-05-23 ENCOUNTER — Inpatient Hospital Stay: Payer: Medicare HMO

## 2022-05-23 VITALS — BP 118/84 | HR 61 | Temp 98.4°F | Resp 20 | Ht 59.0 in | Wt 122.0 lb

## 2022-05-23 DIAGNOSIS — Z5112 Encounter for antineoplastic immunotherapy: Secondary | ICD-10-CM | POA: Diagnosis not present

## 2022-05-23 DIAGNOSIS — C3492 Malignant neoplasm of unspecified part of left bronchus or lung: Secondary | ICD-10-CM | POA: Diagnosis not present

## 2022-05-23 DIAGNOSIS — E876 Hypokalemia: Secondary | ICD-10-CM

## 2022-05-23 DIAGNOSIS — E032 Hypothyroidism due to medicaments and other exogenous substances: Secondary | ICD-10-CM | POA: Diagnosis not present

## 2022-05-23 LAB — CBC AND DIFFERENTIAL
HCT: 38 (ref 36–46)
Hemoglobin: 13.2 (ref 12.0–16.0)
MCV: 95 (ref 81–99)
Neutrophils Absolute: 4.55
Platelets: 308 10*3/uL (ref 150–400)
WBC: 9.1

## 2022-05-23 LAB — CMP (CANCER CENTER ONLY)
ALT: 12 U/L (ref 0–44)
AST: 19 U/L (ref 15–41)
Albumin: 3.9 g/dL (ref 3.5–5.0)
Alkaline Phosphatase: 58 U/L (ref 38–126)
Anion gap: 8 (ref 5–15)
BUN: 8 mg/dL (ref 8–23)
CO2: 31 mmol/L (ref 22–32)
Calcium: 9.1 mg/dL (ref 8.9–10.3)
Chloride: 100 mmol/L (ref 98–111)
Creatinine: 0.97 mg/dL (ref 0.44–1.00)
GFR, Estimated: >60 mL/min (ref >60)
Glucose, Bld: 99 mg/dL (ref 70–99)
Potassium: 3 mmol/L — ABNORMAL LOW (ref 3.5–5.1)
Sodium: 139 mmol/L (ref 135–145)
Total Bilirubin: 0.9 mg/dL (ref 0.3–1.2)
Total Protein: 7.1 g/dL (ref 6.5–8.1)

## 2022-05-23 LAB — CBC W DIFFERENTIAL (~~LOC~~ CC SCANNED REPORT)

## 2022-05-23 LAB — CBC: RBC: 3.94 (ref 3.87–5.11)

## 2022-05-23 LAB — TSH: TSH: 14.88 u[IU]/mL — ABNORMAL HIGH (ref 0.350–4.500)

## 2022-05-23 NOTE — Assessment & Plan Note (Addendum)
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  She was treated with left lower lobectomy in [place].  Her doctor there recommended chemotherapy with immunotherapy, but she refused chemotherapy.  PD-L1 was positive.  She was only willing to take the immunotherapy for 1 year and had a port placed. She started pembrolizumab in August 2023.  We saw her to re-establish care in October when she returned to [facility].  We have continued pembrolizumab every 21 days. CT chest in October, which revealed new indistinct clustered lingular pulmonary nodules the largest measuring 6 mm, which were indeterminate.    She continues to tolerate pembrolizumab fairly well.  She reports clear nasal drainage, scratchy throat and cough occasionally productive of clear sputum, which she attributes to allergies.  She denies shortness of breath or chest pain.  She knows to contact us if she develops fevers, chills or sweats or feels like she may have an infection.  We otherwise will proceed with an 18th cycle of pembrolizumab next week.  Will plan to see her back in 3 weeks with a CBC, comprehensive metabolic panel and TSH prior to a 19th cycle.

## 2022-05-23 NOTE — Progress Notes (Signed)
[facility]  [address] 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  She was treated with left lower lobectomy in [place].  Her doctor there recommended chemotherapy with immunotherapy, but she refused chemotherapy.  PD-L1 was positive.  She was only willing to take the immunotherapy for 1 year and had a port placed. She started pembrolizumab in August 2023.  We saw her to re-establish care in October when she returned to [facility].  We have continued pembrolizumab every 21 days. CT chest in October, which revealed new indistinct clustered lingular pulmonary nodules the largest measuring 6 mm, which were indeterminate.    She continues to tolerate pembrolizumab fairly well.  She reports clear nasal drainage, scratchy throat and cough occasionally productive of clear sputum, which she attributes to allergies.  She denies shortness of breath or chest pain.  She knows to contact us if she develops fevers, chills or sweats or feels like she may have an infection.  We otherwise will proceed with an 18th cycle of pembrolizumab next week.  Will plan to see her back in 3 weeks with a CBC, comprehensive metabolic panel and TSH prior to a 19th cycle.  Hypokalemia Patient has had chronic hypokalemia.  She had been taking potassium chloride 20 mEq 2 tablets twice daily, but then was out of town and ran out of her potassium.  She resumed the potassium yesterday and has had 6 tablets in the last 24 hours.  Her potassium remains 3. She declines IV potassium. She will continue with potassium chloride 40 meq twice daily.  Hypothyroidism due to medication She is on levothyroxine 50 mcg daily.  Her TSH is starting to increase again and she is symptomatic, so I  will increase her levothyroxine to 75 mcg daily.    The patient understands the plans discussed today and is in agreement with them.  She knows to contact our office if she develops concerns prior to her next appointment.   I provided 15 minutes of face-to-face time during this encounter and > 50% was spent counseling as documented under my assessment and plan.    [name], PA-C  [facility] [address] [place] [address] Dept: [phone] Dept Fax: [phone]   Orders Placed This Encounter  Procedures   CBC and differential    This external order was created through the Results Console.   CBC    This external order was created through the Results Console.      CHIEF COMPLAINT:  CC: Stage IIB lung adenocarcinoma  Current Treatment: Maintenance pembrolizumab every 3 weeks  HISTORY OF PRESENT ILLNESS:  Sydney Hunt is a 62 year old female with stage IIB adenocarcinoma of the left lower lobe positive for TTF-1 diagnosed in January. This was initially found on lung cancer screening CT in September 2022.  Over revealed a 1.2 cm left lower lobe nodule with a maximum SUV of 5.65 consistent with a primary lung cancer.  She underwent bronchoscopy in December with findings of adenocarcinoma.  She met with Dr. Cliffton Asters for consideration of surgery. He recommended SBRT, as he felt that her smoking would cause excessive risk for postoperative complications. She sought another opinion with Dr. Maryan Rued in [place]  who agreed to her surgical management. Her daughter lives in [place] and was be her support system.  She underwent left lower lobectomy in April 2023 with mediastinal lymph node dissection.  Pathology revealed a 2.5 cm, grade 2, adenocarcinoma.  There was no evidence of visceral pleural invasion or direct invasion of adjacent structures. All margins were negative for invasive carcinoma.  2 out of 20 lymph  nodes were positive for metastatic disease.  EGFR and ALK mutations were negative, but PD-L1 was 90%.  She was willing to take immunotherapy, but not chemotherapy.  Immunotherapy was not started until August.  We began seeing her in October 2023 when she returned to [place]. CT chest in October revealed new indistinct clustered lingular pulmonary nodules the largest measuring 6 mm, which were indeterminate.  The previously seen pulmonary nodules were stable.  There was a new trace left pleural effusion.  Follow-up CT chest in 3 months was recommended. CT chest in February revealed numerous small scattered bilateral pulmonary nodules including least lingular nodule seen in October were stable.  There was a decrease in the left basilar pleural effusion.  She continues pembrolizumab every 3 weeks.   Oncology History  Non-small cell lung cancer, left (HCC)  01/15/2021 Initial Diagnosis   Non-small cell lung cancer, left (HCC)   02/12/2021 Cancer Staging   Staging form: Lung, AJCC 8th Edition - Clinical stage from 02/12/2021: Stage IA2 (cT1b, cN0, cM0) - Signed by Dellia Beckwith, MD on 02/14/2021 Histopathologic type: Adenocarcinoma, NOS Stage prefix: Initial diagnosis Histologic grade (G): GX Histologic grading system: 4 grade system Laterality: Left Tumor size (mm): 19 Lymph-vascular invasion (LVI): LVI not present (absent)/not identified Diagnostic confirmation: Positive histology Specimen type: Bronchial Biopsy Staged by: Managing physician Stage used in treatment planning: Yes National guidelines used in treatment planning: Yes Type of national guideline used in treatment planning: NCCN Staging comments: Rec surgical resection   05/11/2021 Cancer Staging   Staging form: Lung, AJCC 8th Edition - Pathologic stage from 05/11/2021: Stage IIB (ypT1c, pN1, cM0) - Signed by Dellia Beckwith, MD on 11/12/2021 Histopathologic type: Adenocarcinoma, NOS Stage prefix:  Post-therapy Histologic grade (G): G2 Histologic grading system: 4 grade system Residual tumor (R): R0 - None Laterality: Left Tumor size (mm): 25 Lymph-vascular invasion (LVI): LVI not present (absent)/not identified Diagnostic confirmation: Positive histology PLUS positive immunophenotyping and/or positive genetic studies Specimen type: Excision Staged by: Managing physician Type of lung cancer: Surgically resected non-small cell lung cancer ECOG performance status: Grade 1 Perineural invasion (PNI): Absent Pleural/elastic layer invasion: PL0 Pleural lavage cytology: Unknown Weight loss: Absent Adequacy of mediastinal dissection: Adequate Radiotherapy dose: No Adjuvant radiation: No Adjuvant chemotherapy: Yes Stage used in treatment planning: Yes National guidelines used in treatment planning: Yes Type of national guideline used in treatment planning: NCCN Staging comments: Adjuvant pembrolizumab, PDL 1 90%   10/04/2021 -  Chemotherapy   Patient is on Treatment Plan : LUNG NSCLC Pembrolizumab Adj q21d x 18 cycles         INTERVAL HISTORY:  Sydney Hunt is here today for repeat clinical assessment prior to 18th cycle of pembrolizumab.  She states she continues to tolerate this well.  She reports increased fatigue usually relieved by rest.  She states she feels cold all the time.  She has had some constipation, which she attributes to diet she denies change in appetite.  She states she has been prescribed a new medication for sleep, but has not started it and does not recall the name.  She has been out of town for a funeral and states she ran out of her potassium and chloride.  She has been increasing potassium in her diet to compensate.  She reports clear nasal drainage with a scratchy throat and cough occasionally productive of clear sputum, which she attributes to allergies. She denies fevers, chills or sweating.  She denies myalgias or headaches.  She denies pain. Her appetite is good. Her  weight has been stable.  She was exposed to cigarette smoke as a child.  She no longer smokes cigarettes, but continues to smoke marijuana regularly.  REVIEW OF SYSTEMS:  Review of Systems  Constitutional:  Positive for fatigue. Negative for appetite change, chills, diaphoresis, fever and unexpected weight change.  HENT:   Positive for sore throat. Negative for lump/mass and mouth sores.   Respiratory:  Positive for cough. Negative for hemoptysis and shortness of breath.   Cardiovascular:  Negative for chest pain and leg swelling.  Gastrointestinal:  Negative for abdominal pain, constipation, diarrhea, nausea and vomiting.  Genitourinary:  Negative for difficulty urinating, dysuria, frequency and hematuria.   Musculoskeletal:  Negative for arthralgias, back pain and myalgias.  Skin:  Negative for rash.  Neurological:  Negative for dizziness and headaches.  Hematological:  Negative for adenopathy. Does not bruise/bleed easily.  Psychiatric/Behavioral:  Negative for depression and sleep disturbance. The patient is not nervous/anxious.      VITALS:  Blood pressure 118/84, pulse 61, temperature 98.4 F (36.9 C), temperature source Oral, resp. rate 20, height 4\' 11"  (1.499 m), weight 122 lb (55.3 kg), SpO2 100 %.  Wt Readings from Last 3 Encounters:  05/23/22 122 lb (55.3 kg)  05/06/22 121 lb 1.9 oz (54.9 kg)  05/02/22 124 lb 1.6 oz (56.3 kg)    Body mass index is 24.64 kg/m.  Performance status (ECOG): 1 - Symptomatic but completely ambulatory  PHYSICAL EXAM:  Physical Exam Vitals and nursing note reviewed.  Constitutional:      General: She is not in acute distress.    Appearance: Normal appearance.  HENT:     Head: Normocephalic and atraumatic.     Mouth/Throat:     Mouth: Mucous membranes are moist.     Pharynx: Oropharynx is clear. No oropharyngeal exudate or posterior oropharyngeal erythema.  Eyes:     General: No scleral icterus.    Extraocular Movements: Extraocular  movements intact.     Conjunctiva/sclera: Conjunctivae normal.     Pupils: Pupils are equal, round, and reactive to light.  Cardiovascular:     Rate and Rhythm: Normal rate and regular rhythm.     Heart sounds: Normal heart sounds. No murmur heard.    No friction rub. No gallop.  Pulmonary:     Effort: Pulmonary effort is normal.     Breath sounds: Normal breath sounds. No wheezing, rhonchi or rales.  Abdominal:     General: There is no distension.     Palpations: Abdomen is soft. There is no hepatomegaly, splenomegaly or mass.     Tenderness: There is no abdominal tenderness.  Musculoskeletal:        General: Normal range of motion.     Cervical back: Normal range of motion and neck supple. No tenderness.     Right lower leg: No edema.     Left lower leg: No edema.  Lymphadenopathy:     Cervical: No cervical adenopathy.     Upper Body:     Right upper body: No supraclavicular or axillary adenopathy.  Left upper body: No supraclavicular or axillary adenopathy.     Lower Body: No right inguinal adenopathy. No left inguinal adenopathy.  Skin:    General: Skin is warm and dry.     Coloration: Skin is not jaundiced.     Findings: No rash.  Neurological:     Mental Status: She is alert and oriented to person, place, and time.     Cranial Nerves: No cranial nerve deficit.  Psychiatric:        Mood and Affect: Mood normal.        Behavior: Behavior normal.        Thought Content: Thought content normal.     LABS:      Latest Ref Rng & Units 05/23/2022   12:00 AM 05/02/2022   12:00 AM 04/11/2022   12:00 AM  CBC  WBC  9.1     9.9     11.5      Hemoglobin 12.0 - 16.0 13.2     14.0     13.7      Hematocrit 36 - 46 38     40     39      Platelets 150 - 400 K/uL 308     326     322         This result is from an external source.      Latest Ref Rng & Units 05/23/2022   10:53 AM 05/02/2022   12:00 AM 04/11/2022   10:28 AM  CMP  Glucose 70 - 99 mg/dL 99   91   BUN 8 - 23  mg/dL 8  11     8    Creatinine 0.44 - 1.00 mg/dL 7.84  0.9     6.96   Sodium 135 - 145 mmol/L 139  140     140   Potassium 3.5 - 5.1 mmol/L 3.0  3.0     2.8   Chloride 98 - 111 mmol/L 100  101     98   CO2 22 - 32 mmol/L 31  30     32   Calcium 8.9 - 10.3 mg/dL 9.1  9.6     9.5   Total Protein 6.5 - 8.1 g/dL 7.1   7.8   Total Bilirubin 0.3 - 1.2 mg/dL 0.9   1.4   Alkaline Phos 38 - 126 U/L 58  63     66   AST 15 - 41 U/L 19  28     22    ALT 0 - 44 U/L 12  13     13       This result is from an external source.     Lab Results  Component Value Date   CEA1 2.8 11/08/2021   /  CEA  Date Value Ref Range Status  11/08/2021 2.8 0.0 - 4.7 ng/mL Final    Comment:    (NOTE)                             Nonsmokers          <3.9                             Smokers             <5.6 Roche [employer] Electrochemiluminescence Immunoassay (ECLIA) Values obtained with different assay methods or kits cannot be used interchangeably.  Results cannot be interpreted as absolute evidence of the presence or absence of malignant disease. Performed At: [facility] [address] [place], [place] [id number] Jolene Schimke MD WU:[id number]    No results found for: "PSA1" No results found for: [phone]" No results found for: "CAN125"  No results found for: "TOTALPROTELP", "ALBUMINELP", "A1GS", "A2GS", "BETS", "BETA2SER", "GAMS", "MSPIKE", "SPEI" No results found for: "TIBC", "FERRITIN", "IRONPCTSAT" No results found for: "LDH"  STUDIES:  No results found.    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  Report date is 12/12/2020.     The Multi-Cancer + RNA Panel offered by [facility] includes sequencing and/or deletion/duplication analysis of the following 84 genes:  AIP*, ALK, APC*, ATM*, AXIN2*, BAP1*, BARD1*, BLM*, BMPR1A*, BRCA1*, BRCA2*, BRIP1*, CASR, CDC73*, CDH1*, CDK4, CDKN1B*, CDKN1C*, CDKN2A, CEBPA, CHEK2*, CTNNA1*, DICER1*, DIS3L2*, EGFR, EPCAM, FH   GERD (gastroesophageal reflux disease)    Heartburn 12/27/2021   Hormone disorder 11/20/2020   Hypertension    Hypokalemia 12/27/2021   Leukocytosis 01/17/2022   Migraines    Nodule of left lung 11/21/2020   Non-small cell lung cancer, left (HCC) 01/15/2021   Tobacco use 12/07/2020    Past Surgical History:  Procedure Laterality Date   ABDOMINAL HYSTERECTOMY  1989   unsure of vaginal or abdominal   BRONCHIAL BIOPSY  01/15/2021   Procedure: BRONCHIAL BIOPSIES;  Surgeon: Leslye Peer, MD;  Location: [facility] ENDOSCOPY;  Service: Pulmonary;;   BRONCHIAL BRUSHINGS  01/15/2021   Procedure: BRONCHIAL BRUSHINGS;  Surgeon: Leslye Peer, MD;  Location: [facility] ENDOSCOPY;  Service: Pulmonary;;   BRONCHIAL NEEDLE ASPIRATION BIOPSY  01/15/2021   Procedure: BRONCHIAL NEEDLE ASPIRATION BIOPSIES;  Surgeon: Leslye Peer, MD;  Location: [facility] ENDOSCOPY;  Service: Pulmonary;;   COLONOSCOPY  2022   FIDUCIAL MARKER PLACEMENT  01/15/2021   Procedure: FIDUCIAL MARKER PLACEMENT;  Surgeon: Leslye Peer, MD;  Location: [facility] ENDOSCOPY;  Service: Pulmonary;;   ovaries removed  1986   surgery to remove scar tissue     from colon/bladder in patient's late 30's   TUBAL LIGATION  1986   UPPER GI ENDOSCOPY     years ago in patient's late 23s   VIDEO BRONCHOSCOPY WITH RADIAL ENDOBRONCHIAL ULTRASOUND  01/15/2021   Procedure: VIDEO BRONCHOSCOPY WITH RADIAL ENDOBRONCHIAL ULTRASOUND;  Surgeon: Leslye Peer, MD;  Location: [facility] ENDOSCOPY;  Service: Pulmonary;;    Family History  Problem Relation Age of Onset   Pancreatic cancer Maternal Aunt 50   Brain cancer  Maternal Uncle    Stomach cancer Maternal Uncle        dx. >50   Prostate cancer Half-Brother        passed away at 66   Prostate cancer Half-Brother     Social History:  reports that she has been smoking cigarettes. She started smoking about 61 years ago. She has a 29.50 pack-year smoking history. She has never used smokeless tobacco. She reports current alcohol use of about 4.0 standard drinks of alcohol per week. She reports current drug use. Frequency: 3.00 times per week. Drug: Marijuana.The patient  is alone today.  Allergies:  Allergies  Allergen Reactions   Codeine Rash   Demerol [Meperidine Hcl] Other (See Comments)    coma   Meperidine Other (See Comments)    Several days will pass without patient being aware   Other Dermatitis    Stainless steel / skin weeps   Silver Dermatitis    Sterling silver skin weeps   Gold Bond [Menthol-Zinc Oxide] Dermatitis    Skin weeps   Neosporin [Bacitracin-Polymyxin B] Dermatitis    Skin weeps   Wound Dressing Adhesive Other (See Comments)    CAUSES SKIN TEARS PER PATIENT. INSTEAD USE THE TEGADERM 43M WITH ADHESIVE FREE WINDOW OVER PORT.    Current Medications: Current Outpatient Medications  Medication Sig Dispense Refill   fluticasone (FLONASE) 50 MCG/ACT nasal spray Place into both nostrils.     levothyroxine (SYNTHROID) 75 MCG tablet Take 1 tablet (75 mcg total) by mouth daily before breakfast. 30 tablet 5   traZODone (DESYREL) 50 MG tablet Take 25 mg by mouth at bedtime as needed.     vitamin E 180 MG (400 UNITS) capsule Take 400 Units by mouth daily.     acetaminophen (TYLENOL) 500 MG tablet Take by mouth.     albuterol (VENTOLIN HFA) 108 (90 Base) MCG/ACT inhaler Inhale 2 puffs into the lungs every 4 (four) hours as needed for wheezing or shortness of breath.     amitriptyline (ELAVIL) 10 MG tablet Take 10 mg by mouth at bedtime as needed for sleep.     Ascorbic Acid (VITAMIN C WITH ROSE HIPS) 500 MG tablet Take 500 mg by mouth  daily.     aspirin 81 MG EC tablet Take 81 mg by mouth daily.     atorvastatin (LIPITOR) 40 MG tablet Take 40 mg by mouth daily.     b complex vitamins capsule Take 1 capsule by mouth daily. (Patient not taking: Reported on 01/17/2022)     Biotin 5000 MCG CAPS Take 5,000 mcg by mouth daily. (Patient not taking: Reported on 05/23/2022)     Calcium-Vitamin D-Vitamin K (CHEWABLE CALCIUM PO) Take 650 mg by mouth daily.     celecoxib (CELEBREX) 200 MG capsule Take 200 mg by mouth daily.     gabapentin (NEURONTIN) 300 MG capsule Take 300 mg by mouth at bedtime.     hydrochlorothiazide (HYDRODIURIL) 25 MG tablet Take 25 mg by mouth every morning.     HYDROcodone-acetaminophen (NORCO/VICODIN) 5-325 MG tablet Take 1 tablet by mouth every 6 (six) hours as needed for moderate pain or severe pain. 30 tablet 0   lidocaine-prilocaine (EMLA) cream Apply 1 Application topically as needed. 30 g 0   omeprazole (PRILOSEC) 40 MG capsule Take 1 capsule (40 mg total) by mouth 2 (two) times daily. 60 capsule 5   ondansetron (ZOFRAN) 4 MG tablet Take 1 tablet (4 mg total) by mouth every 4 (four) hours as needed for nausea. 90 tablet 3   potassium chloride SA (KLOR-CON M) 20 MEQ tablet Take 3 tablets (60 mEq total) by mouth 2 (two) times daily. Taking 2 tablets in the morning and 2 tablets in the afternoon 180 tablet 3   prochlorperazine (COMPAZINE) 10 MG tablet TAKE 1 TABLET(10 MG) BY MOUTH EVERY 6 HOURS AS NEEDED FOR NAUSEA OR VOMITING 360 tablet 0   No current facility-administered medications for this visit.

## 2022-05-23 NOTE — Telephone Encounter (Signed)
-----   Message from [name], PA-C sent at 05/23/2022  3:27 PM EDT ----- Please let her know thyroid test is back up again. Was she out of her thyroid medication too? Thanks

## 2022-05-23 NOTE — Telephone Encounter (Signed)
Patient voiced she is taking thyroid medication daily has not missed any doses.

## 2022-05-23 NOTE — Assessment & Plan Note (Addendum)
She is on levothyroxine 50 mcg daily.  Her TSH is starting to increase again and she is symptomatic, so I will increase her levothyroxine to 75 mcg daily.

## 2022-05-23 NOTE — Assessment & Plan Note (Addendum)
Patient has had chronic hypokalemia.  She had been taking potassium chloride 20 mEq 2 tablets twice daily, but then was out of town and ran out of her potassium.  She resumed the potassium yesterday and has had 6 tablets in the last 24 hours.  Her potassium remains 3. She declines IV potassium. She will continue with potassium chloride 40 meq twice daily.

## 2022-05-24 ENCOUNTER — Encounter: Payer: Self-pay | Admitting: Oncology

## 2022-05-24 ENCOUNTER — Encounter: Payer: Self-pay | Admitting: Hematology and Oncology

## 2022-05-24 ENCOUNTER — Other Ambulatory Visit: Payer: Self-pay

## 2022-05-24 MED ORDER — LEVOTHYROXINE SODIUM 75 MCG PO TABS: 75.0000 ug | ORAL_TABLET | Freq: Every day | ORAL | 5 refills | Status: DC

## 2022-05-24 MED FILL — Pembrolizumab IV Soln 100 MG/4ML (25 MG/ML): INTRAVENOUS | Qty: 8 | Status: AC

## 2022-05-25 LAB — T4: T4, Total: 7.1 ug/dL (ref 4.5–12.0)

## 2022-05-27 ENCOUNTER — Inpatient Hospital Stay: Payer: Medicare HMO

## 2022-05-27 VITALS — BP 115/72 | HR 59 | Temp 98.4°F | Resp 18 | Ht 59.0 in | Wt 122.8 lb

## 2022-05-27 DIAGNOSIS — C3492 Malignant neoplasm of unspecified part of left bronchus or lung: Secondary | ICD-10-CM

## 2022-05-27 DIAGNOSIS — Z5112 Encounter for antineoplastic immunotherapy: Secondary | ICD-10-CM | POA: Diagnosis not present

## 2022-05-27 MED ORDER — SODIUM CHLORIDE 0.9 % IV SOLN
200.0000 mg | Freq: Once | INTRAVENOUS | Status: AC
  Administered 2022-05-27: 200 mg via INTRAVENOUS
  Filled 2022-05-27: qty 8

## 2022-05-27 MED ORDER — HEPARIN SOD (PORK) LOCK FLUSH 100 UNIT/ML IV SOLN
500.0000 [IU] | Freq: Once | INTRAVENOUS | Status: AC | PRN
  Administered 2022-05-27: 500 [IU]

## 2022-05-27 MED ORDER — SODIUM CHLORIDE 0.9 % IV SOLN: Freq: Once | INTRAVENOUS | Status: AC

## 2022-05-27 MED ORDER — SODIUM CHLORIDE 0.9% FLUSH
10.0000 mL | INTRAVENOUS | Status: DC | PRN
  Administered 2022-05-27: 10 mL

## 2022-05-27 NOTE — Patient Instructions (Signed)
[facility] [place]  Discharge Instructions: Thank you for choosing [facility] to provide your oncology and hematology care.  If you have a lab appointment with the Cancer Center, please go directly to the Cancer Center and check in at the registration area.   Wear comfortable clothing and clothing appropriate for easy access to any Portacath or PICC line.   We strive to give you quality time with your provider. You may need to reschedule your appointment if you arrive late (15 or more minutes).  Arriving late affects you and other patients whose appointments are after yours.  Also, if you miss three or more appointments without notifying the office, you may be dismissed from the clinic at the provider's discretion.      For prescription refill requests, have your pharmacy contact our office and allow 72 hours for refills to be completed.    Today you received the following chemotherapy and/or immunotherapy agents Keytruda      To help prevent nausea and vomiting after your treatment, we encourage you to take your nausea medication as directed.  BELOW ARE SYMPTOMS THAT SHOULD BE REPORTED IMMEDIATELY: *FEVER GREATER THAN 100.4 F (38 C) OR HIGHER *CHILLS OR SWEATING *NAUSEA AND VOMITING THAT IS NOT CONTROLLED WITH YOUR NAUSEA MEDICATION *UNUSUAL SHORTNESS OF BREATH *UNUSUAL BRUISING OR BLEEDING *URINARY PROBLEMS (pain or burning when urinating, or frequent urination) *BOWEL PROBLEMS (unusual diarrhea, constipation, pain near the anus) TENDERNESS IN MOUTH AND THROAT WITH OR WITHOUT PRESENCE OF ULCERS (sore throat, sores in mouth, or a toothache) UNUSUAL RASH, SWELLING OR PAIN  UNUSUAL VAGINAL DISCHARGE OR ITCHING   Items with * indicate a potential emergency and should be followed up as soon as possible or go to the Emergency Department if any problems should occur.  Please show the CHEMOTHERAPY ALERT CARD or IMMUNOTHERAPY ALERT CARD at check-in to the  Emergency Department and triage nurse.  Should you have questions after your visit or need to cancel or reschedule your appointment, please contact [facility] [place]  Dept: [phone]  and follow the prompts.  Office hours are 8:00 a.m. to 4:30 p.m. Monday - Friday. Please note that voicemails left after 4:00 p.m. may not be returned until the following business day.  We are closed weekends and major holidays. You have access to a nurse at all times for urgent questions. Please call the main number to the clinic Dept: [phone] and follow the prompts.  For any non-urgent questions, you may also contact your provider using MyChart. We now offer e-Visits for anyone 18 and older to request care online for non-urgent symptoms. For details visit [email or web address].   Also download the MyChart app! Go to the app store, search "MyChart", open the app, select , and log in with your MyChart username and password.   

## 2022-06-07 NOTE — Progress Notes (Signed)
[facility]  [address] 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 This was initially found on lung cancer screening CT in September 2022. This revealed the solitary left lower lobe nodule to be enlarging, measuring 1.2 cm in September and then measuring 1.4 x 1.9 cm in December of 2022.  She initially met with Dr. Cliffton Asters for consultation, and he recommended SBRT as he felt that her smoking would cause excessive risk for postoperative complications.  She sought another opinion with  Dr. Maryan Rued in [place] who agreed to her surgical management. Her daughter lives in [place].  She had her surgery on April 12 with left thoracotomy and left lower lobe lobectomy along with mediastinal lymph node dissection.  Pathology revealed an adenocarcinoma grade 2 measuring 2.5 cm in diameter but with no evidence of visceral pleural invasion or direct invasion of adjacent structures.  All margins are negative for invasive carcinoma.  She did have involvement of 2 out of 20 lymph nodes for a T1c N1 M0, stage IIB.  EGFR and ALK mutations were negative but PD-L1 was 90%.  Her doctor recommended chemotherapy with immunotherapy but she refused chemotherapy.  She was only willing to take the immunotherapy for 1 year and had a port placed.  She had her first dose on September 7 and her second dose on September 28.  She has now relocated to [facility] and we have continued this regimen, and we plan a total of 1 year.     Tobacco abuse.  She has not smoked at all since April 12 when she had her surgery. "She fell off the wagon", but has stopped again.    3.    Dissociative identity disorder with 3 personalities. She continues to follow with a psychiatrist.    4.    Hypokalemia.  Potassium was down to 2.8 last  month and so she is on supplement now 2 pills 3 times daily. Her level has increased from 3.0 to 3.4 today. There is an issue with compliance    Plan:  Patient states that she feels off and is tired a lot. I am wondering if the reason she feels off is because her TSH level is off, her TSH last time was 14.880 and before that was 9.743 so her dose of Levothyroxine was increased from 50 mcg to 75 mcg daily. Her CBC is normal today. She takes 6 oral potassium supplements a day and her numbers are improving. Her potassium has improved from 3.0 to 3.4 and I advised her to keep taking her oral supplements. Her TSH and T4 labs are still pending today. Her day 1 cycle 12 of Keytruda is scheduled on 06/19/2022. She has 5 more treatment to go after this one. She will be due for repeat scans in June, 2024.  I will see her back in 3 weeks and 2 days with CBC, CMP, TSH, T4 and repeat CT scan of chest a couple of days before her appointment with me. She understands and agrees with this plan of care. I have answered her questions and she knows to call with any concerns.   Later we will need to arrange for annual mammography and colonoscopy when she is due, since she does have a history of colon polyps.  I provided 22 minutes of face-to-face time during this this encounter and > 50% was spent counseling as documented under my assessment and plan.      Dellia Beckwith, MD [facility] [address] [place] [address] Dept: [phone] Dept Fax: [phone]      CHIEF COMPLAINT:  CC: Adenocarcinoma of the left lung   Current Treatment:  Postoperative adjuvant immunotherapy   HISTORY OF PRESENT ILLNESS:  Sydney Hunt is a 62 y.o. female with a history of tobacco abuse who has adenocarcinoma of the left lung. This was originally found on lung cancer screening CT from September 2022. PET imaging was obtained in October which revealed a  solitary 1.25 cm left lower lobe pulmonary nodule to be hypermetabolic and consistent with small primary lung neoplasm. No findings for mediastinal or hilar adenopathy or metastatic disease elsewhere was observed. She met with the genetic counselor and [facility] diagnostic testing was negative for any clinically significant mutations. CT chest from December revealed an enlarging left lower lobe nodule, measuring 1.4 x 1.9 cm. The additional smaller pulmonary nodules remained unchanged. She was referred to Dr. 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 Immunohistochemical stains were positive for TTF-1, and negative for p63 and CK5/6. 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 Her FEV1 was 1.98, which is 88% of predicted and her FVC was 2.58, which is 89% of predicted. She was deemed a candidate for resection and met with Dr. Cliffton Asters for consultation. As she is not motivated to quit smoking, he felt that surgery would present an excessive risk for postoperative respiratory complications. He recommended SBRT as a good option if the patient was unable to quit smoking.  The patient went for a second opinion to Dr. Lucienne Minks and surgical resection was carried out in [place] on April 12 with a left lower lobe lobectomy.  She was hospitalized for 13 days and has not smoked since that time.  Pathology revealed a 2.5 cm adenocarcinoma, grade 2.  There was no evidence of pleural invasion and margins are clear but she tells me 2 of 20 nodes were positive, for a T1c N1 M0, stage IIB.  A port was placed on August 16 and she was started on pembrolizumab on September 7 and had her second dose on September 28. She refused chemotherapy. We recommend a full year of the pembrolizumab.   I have reviewed her chart and materials related to her cancer extensively and collaborated history with the patient.  Summary of oncologic history is as follows:    Oncology History  Non-small cell lung cancer, left (HCC)  01/15/2021 Initial Diagnosis    Non-small cell lung cancer, left (HCC)    02/12/2021 Cancer Staging    Staging form: Lung, AJCC 8th Edition - Clinical stage from 02/12/2021: Stage IA2 (cT1b, cN0, cM0) - Signed by Dellia Beckwith, MD on 02/14/2021 Histopathologic type: Adenocarcinoma, NOS Stage prefix: Initial diagnosis Histologic grade (G): GX Histologic grading system: 4 grade system Laterality: Left Tumor size (mm): 19 Lymph-vascular invasion (LVI): LVI not present (absent)/not identified Diagnostic confirmation: Positive histology Specimen type: Bronchial Biopsy Staged by: Managing physician Stage used in treatment planning: Yes National guidelines used in treatment planning: Yes Type of national guideline used in treatment planning: NCCN Staging comments: Rec surgical resection    05/11/2021 Cancer Staging    Staging form: Lung, AJCC 8th  Edition - Pathologic stage from 05/11/2021: Stage IIB (ypT1c, pN1, cM0) - Signed by Dellia Beckwith, MD on 11/12/2021 Histopathologic type: Adenocarcinoma, NOS Stage prefix: Post-therapy Histologic grade (G): G2 Histologic grading system: 4 grade system Residual tumor (R): R0 - None Laterality: Left Tumor size (mm): 25 Lymph-vascular invasion (LVI): LVI not present (absent)/not identified Diagnostic confirmation: Positive histology PLUS positive immunophenotyping and/or positive genetic studies Specimen type: Excision Staged by: Managing physician Type of lung cancer: Surgically resected non-small cell lung cancer ECOG performance status: Grade 1 Perineural invasion (PNI): Absent Pleural/elastic layer invasion: PL0 Pleural lavage cytology: Unknown Weight loss: Absent Adequacy of mediastinal dissection: Adequate Radiotherapy dose: No Adjuvant radiation: No Adjuvant chemotherapy: Yes Stage used in treatment planning:  Yes National guidelines used in treatment planning: Yes Type of national guideline used in treatment planning: NCCN Staging comments: Adjuvant pembrolizumab, PDL 1 90%    10/04/2021 -  Chemotherapy    Patient is on Treatment Plan : LUNG NSCLC Pembrolizumab Adj q21d x 18 cycles      INTERVAL HISTORY:  Deidree is here today for a routine follow up for adenocarcinoma of the left lung. Patient states that she feels off and is tired a lot but has no complaints of pain. I am wondering if the reason she feels off is because her TSH level is off, her TSH last time was 14.880 and before that was 9.743 so her dose of Levothyroxine was increased from 50 mcg to 75 mcg daily. Her CBC is normal today. She takes 6 oral potassium supplements a day and her numbers are improving. Her potassium has improved from 3.0 to 3.4 and I advised her to keep taking her oral supplements. Her TSH and T4 labs are still pending today. Her day 1 cycle 12 of Keytruda is scheduled on 06/19/2022. She has 5 more treatment to go after this one. She will be due for repeat scans in June, 2024.  I will see her back in 3 weeks and 2 days with CBC, CMP, TSH, T4 and repeat CT scan of chest a couple of days before her appointment with me. She denies signs of infection such as sore throat, sinus drainage, or urinary symptoms.  She denies fevers or recurrent chills. She denies pain. She denies nausea, vomiting, chest pain, dyspnea or cough. Her appetite is 50% weight has decreased 2 pounds over last 2 weeks   HISTORY:   Past Medical History:  Diagnosis Date   Allergy    Arthritis    Complication of anesthesia    slow to wake up   COPD (chronic obstructive pulmonary disease) (HCC)    COPD, mild (HCC) 12/15/2020   Family history of pancreatic cancer 11/28/2020   Family history of prostate cancer 11/28/2020   Family history of stomach cancer 11/28/2020   Genetic testing 12/18/2020   [facility] Multi-Cancer Panel was Negative. Report date is  12/12/2020.     The Multi-Cancer + RNA Panel offered by [facility] includes sequencing and/or deletion/duplication analysis of the following 84 genes:  AIP*, ALK, APC*, ATM*, AXIN2*, BAP1*, BARD1*, BLM*, BMPR1A*, BRCA1*, BRCA2*, BRIP1*, CASR, CDC73*, CDH1*, CDK4, CDKN1B*, CDKN1C*, CDKN2A, CEBPA, CHEK2*, CTNNA1*, DICER1*, DIS3L2*, EGFR, EPCAM, FH   GERD (gastroesophageal reflux disease)    Heartburn 12/27/2021   Hormone disorder 11/20/2020   Hypertension    Hypokalemia 12/27/2021   Leukocytosis 01/17/2022   Migraines    Nodule of left lung 11/21/2020   Non-small cell lung cancer, left (HCC) 01/15/2021   Tobacco use 12/07/2020  Past Surgical History:  Procedure Laterality Date   ABDOMINAL HYSTERECTOMY  1989   unsure of vaginal or abdominal   BRONCHIAL BIOPSY  01/15/2021   Procedure: BRONCHIAL BIOPSIES;  Surgeon: Leslye Peer, MD;  Location[facility] ENDOSCOPY;  Service: Pulmonary;;   BRONCHIAL BRUSHINGS  01/15/2021   Procedure: BRONCHIAL BRUSHINGS;  Surgeon: Leslye Peer, MD;  Location: [facility] ENDOSCOPY;  Service: Pulmonary;;   BRONCHIAL NEEDLE ASPIRATION BIOPSY  01/15/2021   Procedure: BRONCHIAL NEEDLE ASPIRATION BIOPSIES;  Surgeon: Leslye Peer, MD;  Location: [facility] ENDOSCOPY;  Service: Pulmonary;;   COLONOSCOPY  2022   FIDUCIAL MARKER PLACEMENT  01/15/2021   Procedure: FIDUCIAL MARKER PLACEMENT;  Surgeon: Leslye Peer, MD;  Location: [facility] ENDOSCOPY;  Service: Pulmonary;;   ovaries removed  1986   surgery to remove scar tissue     from colon/bladder in patient's late 30's   TUBAL LIGATION  1986   UPPER GI ENDOSCOPY     years ago in patient's late 57s   VIDEO BRONCHOSCOPY WITH RADIAL ENDOBRONCHIAL ULTRASOUND  01/15/2021   Procedure: VIDEO BRONCHOSCOPY WITH RADIAL ENDOBRONCHIAL ULTRASOUND;  Surgeon: Leslye Peer, MD;  Location: [facility] ENDOSCOPY;  Service: Pulmonary;;   Family History  Problem Relation Age of Onset   Pancreatic cancer Maternal Aunt 50   Brain cancer Maternal Uncle     Stomach cancer Maternal Uncle        dx. >50   Prostate cancer Half-Brother        passed away at 80   Prostate cancer Half-Brother    Social History:  reports that she quit smoking about 10 months ago. Her smoking use included cigarettes. She started smoking about 61 years ago. She has a 29.50 pack-year smoking history. She has never used smokeless tobacco. She reports current alcohol use of about 4.0 standard drinks of alcohol per week. She reports current drug use. Frequency: 3.00 times per week. Drug: Marijuana. She is widowed and lives at home.  She has quit smoking since April 12, when she had her surgical resection.  She has 2 biological children and 1 stepchild. She is disabled, and has been exposed to chemicals or other toxic agents. She was previously employed at a hosiery, chicken farm and cleaning houses. 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   Allergies  Allergen Reactions   Codeine Rash   Demerol [Meperidine Hcl] Other (See Comments)    coma   Meperidine Other (See Comments)    Several days will pass without patient being aware   Other Dermatitis    Stainless steel / skin weeps   Silver Dermatitis    Sterling silver skin weeps   Gold Bond [Menthol-Zinc Oxide] Dermatitis    Skin weeps   Neosporin [Bacitracin-Polymyxin B] Dermatitis    Skin weeps   Wound Dressing Adhesive Other (See Comments)    CAUSES SKIN TEARS PER PATIENT. INSTEAD USE THE TEGADERM 5M WITH ADHESIVE FREE WINDOW OVER PORT.    Current Medications:       Current Outpatient Medications  Medication Sig Dispense Refill   albuterol (VENTOLIN HFA) 108 (90 Base) MCG/ACT inhaler Inhale 2 puffs into the lungs every 4 (four) hours as needed for wheezing or shortness of breath.       amitriptyline (ELAVIL) 10 MG tablet Take 10 mg by mouth at bedtime as needed for sleep.       Ascorbic Acid (VITAMIN C WITH ROSE HIPS) 500 MG tablet Take 500 mg by mouth daily.  aspirin 81 MG EC  tablet Take 81 mg by mouth daily.       atorvastatin (LIPITOR) 40 MG tablet Take 40 mg by mouth daily.       b complex vitamins capsule Take 1 capsule by mouth daily. (Patient not taking: Reported on 01/17/2022)       Biotin 5000 MCG CAPS Take 5,000 mcg by mouth daily.       Calcium-Vitamin D-Vitamin K (CHEWABLE CALCIUM PO) Take 650 mg by mouth daily.       celecoxib (CELEBREX) 200 MG capsule Take 200 mg by mouth daily.       fluconazole (DIFLUCAN) 100 MG tablet Take 1 tablet (100 mg total) by mouth daily. 10 tablet 0   gabapentin (NEURONTIN) 300 MG capsule Take 300 mg by mouth at bedtime.       hydrochlorothiazide (HYDRODIURIL) 25 MG tablet Take 25 mg by mouth every morning.       HYDROcodone-acetaminophen (NORCO/VICODIN) 5-325 MG tablet Take 1 tablet by mouth every 6 (six) hours as needed for moderate pain or severe pain. 30 tablet 0   levothyroxine (SYNTHROID) 50 MCG tablet Take 1 tablet (50 mcg total) by mouth daily before breakfast. 30 tablet 2   lidocaine-prilocaine (EMLA) cream Apply 1 Application topically as needed. 30 g 0   omeprazole (PRILOSEC) 40 MG capsule Take 1 capsule (40 mg total) by mouth daily. 30 capsule 2   ondansetron (ZOFRAN) 4 MG tablet Take 1 tablet (4 mg total) by mouth every 4 (four) hours as needed for nausea. 90 tablet 3   potassium chloride SA (KLOR-CON M) 20 MEQ tablet Take 20 mEq by mouth daily.       prochlorperazine (COMPAZINE) 10 MG tablet Take 1 tablet (10 mg total) by mouth every 6 (six) hours as needed for nausea or vomiting. 30 tablet 2   vitamin E 180 MG (400 UNITS) capsule Take 400 Units by mouth daily. (Patient not taking: Reported on 01/17/2022)        REVIEW OF SYSTEMS:  Review of Systems  Constitutional:  Positive for fatigue. Negative for appetite change, chills, diaphoresis, fever and unexpected weight change.  HENT:  Negative.  Negative for hearing loss, lump/mass, mouth sores, nosebleeds, sore throat, tinnitus, trouble swallowing and voice change.    Eyes: Negative.  Negative for eye problems and icterus.  Respiratory:  Negative for chest tightness, cough, hemoptysis, shortness of breath and wheezing.   Cardiovascular: Negative.  Negative for chest pain, leg swelling and palpitations.  Gastrointestinal:  Positive for nausea (occasional could be due to missed medication.). Negative for abdominal distention, abdominal pain, blood in stool, constipation, diarrhea, rectal pain and vomiting.  Endocrine: Negative.   Genitourinary: Negative.  Negative for bladder incontinence, difficulty urinating, dyspareunia, dysuria, frequency, hematuria, menstrual problem, nocturia, pelvic pain, vaginal bleeding and vaginal discharge.   Musculoskeletal: Negative.  Negative for arthralgias, back pain, flank pain, gait problem, myalgias, neck pain and neck stiffness.  Skin: Negative.  Negative for itching, rash and wound.  Neurological:  Negative for dizziness, extremity weakness, gait problem, headaches, light-headedness, numbness, seizures and speech difficulty.  Hematological: Negative.  Negative for adenopathy. Does not bruise/bleed easily.  Psychiatric/Behavioral: Negative.  Negative for confusion, decreased concentration, depression, sleep disturbance and suicidal ideas. The patient is not nervous/anxious.        Dissociative identity disorder   VITALS:  Blood pressure 126/87, pulse 65, temperature 98 F (36.7 C), temperature source Oral, resp. rate 17, height 4\' 11"  (1.499 m), weight 132 lb  4.8 oz (60 kg), SpO2 99 %.     Wt Readings from Last 3 Encounters:  03/04/22 131 lb (59.4 kg)  02/28/22 132 lb 4.8 oz (60 kg)  02/07/22 137 lb 3.2 oz (62.2 kg)    Body mass index is 26.72 kg/m.   Performance status (ECOG): 1 - Symptomatic but completely ambulatory   PHYSICAL EXAM:  Physical Exam Vitals and nursing note reviewed.  Constitutional:      General: She is not in acute distress.    Appearance: Normal appearance. She is normal weight. She is not  ill-appearing, toxic-appearing or diaphoretic.  HENT:     Head: Normocephalic and atraumatic.     Right Ear: Tympanic membrane, ear canal and external ear normal. There is no impacted cerumen.     Left Ear: Tympanic membrane, ear canal and external ear normal. There is no impacted cerumen.     Nose: Nose normal. No congestion or rhinorrhea.     Mouth/Throat:     Mouth: Mucous membranes are moist.     Pharynx: Oropharynx is clear. No oropharyngeal exudate or posterior oropharyngeal erythema.  Eyes:     General: No scleral icterus.       Right eye: No discharge.        Left eye: No discharge.     Extraocular Movements: Extraocular movements intact.     Conjunctiva/sclera: Conjunctivae normal.     Pupils: Pupils are equal, round, and reactive to light.  Neck:     Vascular: No carotid bruit.  Cardiovascular:     Rate and Rhythm: Normal rate and regular rhythm.     Pulses: Normal pulses.     Heart sounds: Normal heart sounds. No murmur heard.    No friction rub. No gallop.  Pulmonary:     Effort: Pulmonary effort is normal. No respiratory distress.     Breath sounds: Normal breath sounds. No stridor. No wheezing, rhonchi or rales.  Chest:     Chest wall: No tenderness.  Abdominal:     General: Bowel sounds are normal. There is no distension.     Palpations: Abdomen is soft. There is no hepatomegaly, splenomegaly or mass.     Tenderness: There is no abdominal tenderness. There is no right CVA tenderness, left CVA tenderness, guarding or rebound.     Hernia: No hernia is present.  Musculoskeletal:        General: No swelling, tenderness, deformity or signs of injury. Normal range of motion.     Cervical back: Normal range of motion and neck supple. No rigidity or tenderness.     Right lower leg: No edema.     Left lower leg: No edema.  Lymphadenopathy:     Cervical: No cervical adenopathy.     Right cervical: No superficial, deep or posterior cervical adenopathy.    Left cervical:  No superficial, deep or posterior cervical adenopathy.     Upper Body:     Right upper body: No supraclavicular, axillary or pectoral adenopathy.     Left upper body: No supraclavicular, axillary or pectoral adenopathy.  Skin:    General: Skin is warm and dry.     Coloration: Skin is not jaundiced or pale.     Findings: No bruising, erythema, lesion or rash.  Neurological:     General: No focal deficit present.     Mental Status: She is alert and oriented to person, place, and time. Mental status is at baseline.     Cranial Nerves: No  cranial nerve deficit.     Sensory: No sensory deficit.     Motor: No weakness.     Coordination: Coordination normal.     Gait: Gait normal.     Deep Tendon Reflexes: Reflexes normal.  Psychiatric:        Mood and Affect: Mood normal.        Behavior: Behavior normal.        Thought Content: Thought content normal.        Judgment: Judgment normal.     LABS:    CMP 2 wk ago (05/23/22) 1 mo ago (05/02/22) 1 mo ago (04/11/22) 2 mo ago (03/19/22)  Sodium 135 - 145 mmol/L 139 140 R 140 138 R  Potassium 3.5 - 5.1 mmol/L 3.0 Low  3.0 Abnormal  R 2.8 Low  3.2 Abnormal  R  Chloride 98 - 111 mmol/L 100 101 R 98 101 R  CO2 22 - 32 mmol/L 31 30 Abnormal  R 32 29 Abnormal  R  Glucose, Bld 70 - 99 mg/dL 99 90 R 91 CM 161 R  BUN 8 - 23 mg/dL 8 11 R 8 12 R  Creatinine 0.44 - 1.00 mg/dL 0.96 0.9 R 0.45 High  0.9 R  Calcium 8.9 - 10.3 mg/dL 9.1  9.5   Total Protein 6.5 - 8.1 g/dL 7.1  7.8   Albumin 3.5 - 5.0 g/dL 3.9  4.4   AST 15 - 41 U/L 19  22   ALT 0 - 44 U/L 12  13   Alkaline Phosphatase 38 - 126 U/L 58  66   Total Bilirubin 0.3 - 1.2 mg/dL 0.9  1.4 High       Component Ref Range & Units 2 wk ago (05/23/22) 1 mo ago (05/02/22) 1 mo ago (04/11/22) 3 mo ago (02/28/22) 4 mo ago (02/07/22)  TSH 0.350 - 4.500 uIU/mL 14.880 High  9.734 High  CM 16.977 High  CM 47.710 High  CM 201.807 High  CM     Component Ref Range & Units 2 wk  ago (05/23/22) 1 mo ago (05/02/22) 1 mo ago (04/11/22) 3 mo ago (02/28/22) 4 mo ago (02/07/22) 4 mo ago (01/17/22)  T4, Total 4.5 - 12.0 ug/dL 7.1 9.0 CM 9.0 CM 8.5 CM 1.3 Low  CM <0.4 Low  CM     Recent Labs       Lab Results  Component Value Date    CEA1 2.8 11/08/2021     /  Last Labs         CEA  Date Value Ref Range Status  11/08/2021 2.8 0.0 - 4.7 ng/mL Final      Comment:      (NOTE)                             Nonsmokers          <3.9                             Smokers             <5.6 [employer] Electrochemiluminescence Immunoassay (ECLIA) Values obtained with different assay methods or kits cannot be used interchangeably.  Results cannot be interpreted as absolute evidence of the presence or absence of malignant disease. Performed At: [facility] [address] [place], [place] [id number] Jolene Schimke MD NW:[id number]  STUDIES:  Imaging Results  No results found.  EXAM: 03/19/2022 CT CHEST WITH CONTRAST IMPRESSION Numerous small scattered bilateral pulmonary nodules are all stable, including persistent stable lingular nodules that were new on her 11/23/2021 CT scan. The persistence of these nodules may indicate pulmonary metastases.  No new or progressive metastatic disease in the chest Trace basilar left pleural effusion decreased.  Aortic Atherosclerosis (ICD10-170.0) and Emphysema. ([id number])    I,Jasmine M Lassiter,acting as a scribe for Dellia Beckwith, MD.,have documented all relevant documentation on the behalf of Dellia Beckwith, MD,as directed by  Dellia Beckwith, MD while in the presence of Dellia Beckwith, MD.

## 2022-06-08 ENCOUNTER — Other Ambulatory Visit: Payer: Self-pay | Admitting: Oncology

## 2022-06-08 DIAGNOSIS — Z2989 Encounter for other specified prophylactic measures: Secondary | ICD-10-CM

## 2022-06-08 DIAGNOSIS — E032 Hypothyroidism due to medicaments and other exogenous substances: Secondary | ICD-10-CM

## 2022-06-09 ENCOUNTER — Other Ambulatory Visit: Payer: Self-pay | Admitting: Oncology

## 2022-06-09 DIAGNOSIS — E032 Hypothyroidism due to medicaments and other exogenous substances: Secondary | ICD-10-CM

## 2022-06-09 MED ORDER — LEVOTHYROXINE SODIUM 75 MCG PO TABS
75.0000 ug | ORAL_TABLET | Freq: Every day | ORAL | 3 refills | Status: DC
Start: 2022-06-09 — End: 2022-08-16

## 2022-06-09 NOTE — Telephone Encounter (Signed)
Dose change.

## 2022-06-11 ENCOUNTER — Other Ambulatory Visit: Payer: Self-pay | Admitting: Oncology

## 2022-06-11 ENCOUNTER — Inpatient Hospital Stay: Payer: Medicare HMO

## 2022-06-11 ENCOUNTER — Encounter: Payer: Self-pay | Admitting: Oncology

## 2022-06-11 ENCOUNTER — Inpatient Hospital Stay: Payer: Medicare HMO | Attending: Hematology and Oncology | Admitting: Oncology

## 2022-06-11 VITALS — BP 134/78 | HR 72 | Temp 98.7°F | Resp 18 | Ht 59.0 in | Wt 120.0 lb

## 2022-06-11 DIAGNOSIS — J449 Chronic obstructive pulmonary disease, unspecified: Secondary | ICD-10-CM | POA: Diagnosis not present

## 2022-06-11 DIAGNOSIS — Z800 Family history of malignant neoplasm of digestive organs: Secondary | ICD-10-CM | POA: Diagnosis not present

## 2022-06-11 DIAGNOSIS — C3492 Malignant neoplasm of unspecified part of left bronchus or lung: Secondary | ICD-10-CM | POA: Diagnosis not present

## 2022-06-11 DIAGNOSIS — Z808 Family history of malignant neoplasm of other organs or systems: Secondary | ICD-10-CM | POA: Diagnosis not present

## 2022-06-11 DIAGNOSIS — D72829 Elevated white blood cell count, unspecified: Secondary | ICD-10-CM | POA: Diagnosis not present

## 2022-06-11 DIAGNOSIS — Z79899 Other long term (current) drug therapy: Secondary | ICD-10-CM | POA: Insufficient documentation

## 2022-06-11 DIAGNOSIS — R5383 Other fatigue: Secondary | ICD-10-CM | POA: Insufficient documentation

## 2022-06-11 DIAGNOSIS — R110 Nausea: Secondary | ICD-10-CM | POA: Diagnosis not present

## 2022-06-11 DIAGNOSIS — F4481 Dissociative identity disorder: Secondary | ICD-10-CM | POA: Diagnosis not present

## 2022-06-11 DIAGNOSIS — Z8042 Family history of malignant neoplasm of prostate: Secondary | ICD-10-CM | POA: Diagnosis not present

## 2022-06-11 DIAGNOSIS — C3432 Malignant neoplasm of lower lobe, left bronchus or lung: Secondary | ICD-10-CM | POA: Insufficient documentation

## 2022-06-11 DIAGNOSIS — I10 Essential (primary) hypertension: Secondary | ICD-10-CM | POA: Diagnosis not present

## 2022-06-11 DIAGNOSIS — I700 Atherosclerosis of aorta: Secondary | ICD-10-CM | POA: Insufficient documentation

## 2022-06-11 DIAGNOSIS — Z5112 Encounter for antineoplastic immunotherapy: Secondary | ICD-10-CM | POA: Insufficient documentation

## 2022-06-11 DIAGNOSIS — E876 Hypokalemia: Secondary | ICD-10-CM | POA: Diagnosis not present

## 2022-06-11 LAB — BASIC METABOLIC PANEL
BUN: 14 (ref 4–21)
CO2: 29 — AB (ref 13–22)
Chloride: 101 (ref 99–108)
Creatinine: 0.9 (ref 0.5–1.1)
EGFR: 60
Glucose: 110
Potassium: 3.4 mEq/L — AB (ref 3.5–5.1)
Sodium: 140 (ref 137–147)

## 2022-06-11 LAB — CBC: RBC: 4.06 (ref 3.87–5.11)

## 2022-06-11 LAB — CBC AND DIFFERENTIAL
HCT: 39 (ref 36–46)
Hemoglobin: 13.4 (ref 12.0–16.0)
Neutrophils Absolute: 4.93
Platelets: 286 10*3/uL (ref 150–400)
WBC: 8.8

## 2022-06-11 LAB — CBC W DIFFERENTIAL (~~LOC~~ CC SCANNED REPORT)

## 2022-06-11 LAB — HEPATIC FUNCTION PANEL
ALT: 18 U/L (ref 7–35)
AST: 36 — AB (ref 13–35)
Alkaline Phosphatase: 58 (ref 25–125)
Bilirubin, Total: 1.5

## 2022-06-11 LAB — TSH: TSH: 2.618 u[IU]/mL (ref 0.350–4.500)

## 2022-06-11 LAB — COMPREHENSIVE METABOLIC PANEL (ASHBORO CC SCANNED REPORT)

## 2022-06-11 LAB — COMPREHENSIVE METABOLIC PANEL
Albumin: 4.1 (ref 3.5–5.0)
Calcium: 9.7 (ref 8.7–10.7)

## 2022-06-12 ENCOUNTER — Encounter: Payer: Self-pay | Admitting: Oncology

## 2022-06-13 LAB — T4: T4, Total: 11.1 ug/dL (ref 4.5–12.0)

## 2022-06-18 ENCOUNTER — Other Ambulatory Visit: Payer: Self-pay | Admitting: Pharmacist

## 2022-06-19 ENCOUNTER — Inpatient Hospital Stay: Payer: Medicare HMO

## 2022-06-19 VITALS — BP 124/80 | HR 66 | Temp 98.9°F | Resp 20 | Ht 59.0 in | Wt 117.0 lb

## 2022-06-19 DIAGNOSIS — Z5112 Encounter for antineoplastic immunotherapy: Secondary | ICD-10-CM | POA: Diagnosis not present

## 2022-06-19 DIAGNOSIS — C3492 Malignant neoplasm of unspecified part of left bronchus or lung: Secondary | ICD-10-CM

## 2022-06-19 MED ORDER — HEPARIN SOD (PORK) LOCK FLUSH 100 UNIT/ML IV SOLN
500.0000 [IU] | Freq: Once | INTRAVENOUS | Status: AC | PRN
  Administered 2022-06-19: 500 [IU]

## 2022-06-19 MED ORDER — SODIUM CHLORIDE 0.9 % IV SOLN: Freq: Once | INTRAVENOUS | Status: AC

## 2022-06-19 MED ORDER — SODIUM CHLORIDE 0.9% FLUSH
10.0000 mL | INTRAVENOUS | Status: DC | PRN
  Administered 2022-06-19: 10 mL

## 2022-06-19 MED ORDER — SODIUM CHLORIDE 0.9 % IV SOLN
200.0000 mg | Freq: Once | INTRAVENOUS | Status: AC
  Administered 2022-06-19: 200 mg via INTRAVENOUS
  Filled 2022-06-19: qty 8

## 2022-06-19 NOTE — Patient Instructions (Signed)

## 2022-06-23 ENCOUNTER — Other Ambulatory Visit: Payer: Self-pay | Admitting: Oncology

## 2022-06-23 DIAGNOSIS — R12 Heartburn: Secondary | ICD-10-CM

## 2022-06-24 ENCOUNTER — Encounter: Payer: Self-pay | Admitting: Oncology

## 2022-06-25 ENCOUNTER — Encounter: Payer: Self-pay | Admitting: Oncology

## 2022-06-26 ENCOUNTER — Encounter: Payer: Self-pay | Admitting: Oncology

## 2022-06-27 NOTE — Progress Notes (Signed)
[facility]  [address] 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 This was initially found on lung cancer screening CT in September 2022. This revealed the solitary left lower lobe nodule to be enlarging, measuring 1.2 cm in September and then measuring 1.4 x 1.9 cm in December of 2022.  She initially met with Dr. Cliffton Asters for consultation, and he recommended SBRT as he felt that her smoking would cause excessive risk for postoperative complications.  She sought another opinion with  Dr. Maryan Rued in [place] who agreed to her surgical management. Her daughter lives in [place].  She had her surgery on April 12 with left thoracotomy and left lower lobe lobectomy along with mediastinal lymph node dissection.  Pathology revealed an adenocarcinoma grade 2 measuring 2.5 cm in diameter but with no evidence of visceral pleural invasion or direct invasion of adjacent structures.  All margins are negative for invasive carcinoma.  She did have involvement of 2 out of 20 lymph nodes for a T1c N1 M0, stage IIB.  EGFR and ALK mutations were negative but PD-L1 was 90%.  Her doctor recommended chemotherapy with immunotherapy but she refused chemotherapy.  She was only willing to take the immunotherapy for 1 year and had a port placed.  She had her first dose on September 7 and her second dose on September 28.  She has now relocated to [facility] and we have continued this regimen, and we plan a total of 1 year.     Tobacco abuse.  She has not smoked at all since April 12 when she had her surgery. Patient informed me that she started back smoking cigarettes but states that she is trying to quit again.    3.    Dissociative identity disorder with 3 personalities. She continues to follow with a  psychiatrist.    4.    Hypokalemia.  Potassium was down to 3.0 last month and so she was on supplement now 2 pills 3 times daily. Her level has increased to 4.5 today so she can decrease to 2 pills BID.    Plan:  Patient informed me that she started back smoking cigarettes but states that she is trying to quit again. She had CT scan on 07/02/2022 that revealed a stable exam with numerous scattered bilateral tiny pulmonary nodules, which are not substantially changed in the interval. There is no definite new suspicious nodules or mass, but she  does have mild circumferential wall thickening in the distal esophagus, stable in the interval. She has a right hilar node which has increased slightly from 6mm to 9mm and a left hilar node is stable at 8mm. Since these are both still within normal size, I am not concerned but we will continue to get regular scans. Her day 1 cycle 13 of Keytruda is scheduled on 07/08/2022. Her WBC is 10.2, hemoglobin 13.7, and palette count 283,000 as of 07/02/2022. Her CMP is normal other than a elevated non-fasting glucose of 143 and her potassium has improved from 3.4 to 4.5. She takes 6 oral potassium supplements daily and I instructed her to decrease to 4 pills daily. I advised her to take 2 tablets of Omeprazole 40mg  daily. 1 in the morning and 1 in the evening. I advised that she can  have her port removed once we complete her therapy. She has 4 more treatments to go after this and we will plan repeat scans in September or October. If that looks good, we will repeat every 6 months but continue follow-up every 3 months at first. I will see her back in 3 weeks with CBC and CMP. I will also check her thyroid since we increased her dose to daily last time. She understands and agrees with this plan of care. I have answered her questions and she knows to call with any concerns.   Later we will need to arrange for annual mammography and colonoscopy when she is due, since she does  have a history of colon polyps.     I provided 25 minutes of face-to-face time during this this encounter and > 50% was spent counseling as documented under my assessment and plan.      Dellia Beckwith, MD [facility] [address] [place] [address] Dept: [phone] Dept Fax: [phone]      CHIEF COMPLAINT:  CC: Adenocarcinoma of the left lung   Current Treatment:  Postoperative adjuvant immunotherapy   HISTORY OF PRESENT ILLNESS:  Sydney Hunt is a 62 y.o. female with a history of tobacco abuse who has adenocarcinoma of the left lung. This was originally found on lung cancer screening CT from September 2022. PET imaging was obtained in October which revealed a solitary 1.25 cm left lower lobe pulmonary nodule to be hypermetabolic and consistent with small primary lung neoplasm. No findings for mediastinal or hilar adenopathy or metastatic disease elsewhere was observed. She met with the genetic counselor and [facility] diagnostic testing was negative for any clinically significant mutations. CT chest from December revealed an enlarging left lower lobe nodule, measuring 1.4 x 1.9 cm. The additional smaller pulmonary nodules remained unchanged. She was referred to Dr. 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 Immunohistochemical stains were positive for TTF-1, and negative for p63 and CK5/6. 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 Her FEV1 was 1.98, which is 88% of predicted and her FVC was 2.58, which is 89% of predicted. She was deemed a candidate for resection and met with Dr. Cliffton Asters for consultation. As she is not motivated to quit smoking, he felt that surgery would present an excessive risk for postoperative respiratory complications. He recommended SBRT as a good option if the patient  was unable to quit smoking.  The patient went for a second opinion to Dr. Lucienne Minks and surgical resection was carried out in [place] on April 12 with a left lower lobe lobectomy.  She was hospitalized for 13 days and has not smoked since that time.  Pathology revealed a 2.5 cm adenocarcinoma, grade 2.  There was no evidence of pleural invasion and margins are clear but she tells me 2 of 20 nodes were positive, for a T1c N1 M0, stage IIB.  A port was placed on August 16 and she was started on pembrolizumab on September 7 and had her second dose on September 28. She refused chemotherapy. We recommend a full year of the pembrolizumab.   I have reviewed her chart and materials related to her cancer extensively and collaborated history with the patient. Summary of oncologic history is as follows:    Oncology History  Non-small cell lung cancer, left (HCC)  01/15/2021 Initial Diagnosis  Non-small cell lung cancer, left (HCC)    02/12/2021 Cancer Staging    Staging form: Lung, AJCC 8th Edition - Clinical stage from 02/12/2021: Stage IA2 (cT1b, cN0, cM0) - Signed by Dellia Beckwith, MD on 02/14/2021 Histopathologic type: Adenocarcinoma, NOS Stage prefix: Initial diagnosis Histologic grade (G): GX Histologic grading system: 4 grade system Laterality: Left Tumor size (mm): 19 Lymph-vascular invasion (LVI): LVI not present (absent)/not identified Diagnostic confirmation: Positive histology Specimen type: Bronchial Biopsy Staged by: Managing physician Stage used in treatment planning: Yes National guidelines used in treatment planning: Yes Type of national guideline used in treatment planning: NCCN Staging comments: Rec surgical resection    05/11/2021 Cancer Staging    Staging form: Lung, AJCC 8th Edition - Pathologic stage from 05/11/2021: Stage IIB (ypT1c, pN1, cM0) - Signed by Dellia Beckwith, MD on 11/12/2021 Histopathologic type: Adenocarcinoma, NOS Stage prefix:  Post-therapy Histologic grade (G): G2 Histologic grading system: 4 grade system Residual tumor (R): R0 - None Laterality: Left Tumor size (mm): 25 Lymph-vascular invasion (LVI): LVI not present (absent)/not identified Diagnostic confirmation: Positive histology PLUS positive immunophenotyping and/or positive genetic studies Specimen type: Excision Staged by: Managing physician Type of lung cancer: Surgically resected non-small cell lung cancer ECOG performance status: Grade 1 Perineural invasion (PNI): Absent Pleural/elastic layer invasion: PL0 Pleural lavage cytology: Unknown Weight loss: Absent Adequacy of mediastinal dissection: Adequate Radiotherapy dose: No Adjuvant radiation: No Adjuvant chemotherapy: Yes Stage used in treatment planning: Yes National guidelines used in treatment planning: Yes Type of national guideline used in treatment planning: NCCN Staging comments: Adjuvant pembrolizumab, PDL 1 90%    10/04/2021 -  Chemotherapy    Patient is on Treatment Plan : LUNG NSCLC Pembrolizumab Adj q21d x 18 cycles      INTERVAL HISTORY:  Karelyn is here today for a routine follow up for adenocarcinoma of the left lung. Patient states that she feels well and complains of a dry cough with no production of phlegm, especially after smoking. She started back smoking cigarettes but states that she is trying to quit again. She had a CT scan on 07/02/2022 that revealed a stable exam with numerous scattered bilateral tiny pulmonary nodules which are not substantially changed in the interval. There are no definite new suspicious nodules or mass, but she has mild circumferential wall thickening in the distal esophagus, stable in the interval. She has a right hilar node which has increased slightly from 6mm to 9mm and a left hilar node is stable at 8mm. Since these are both still within normal size, I am not concerned but we will continue to get regular scans. Her day 1 cycle 13 of Keytruda is  scheduled on 07/08/2022. Her WBC is 10.2, hemoglobin 13.7, and platelet count 283,000 as of 07/02/2022. Her CMP is normal other than a elevated non-fasting glucose of 143 and her potassium has improved from 3.4 to 4.5. She takes 6 oral potassium supplements daily and I instructed her to decrease to 4 daily. I advised her to take 2 tablets of Omeprazole 40mg  daily 1 in the morning and 1 in the evening. I advised that she can have her port removed once we complete her therapy. She has 4 more treatments to go after this and we will plan repeat scans in September or October. If that looks good we will repeat every 6 months but continue follow-up every 3 months at first. I will see her back in 3 weeks with CBC and CMP. I will also check her thyroid  since we increased her dose to daily last time.  She denies signs of infection such as sore throat, sinus drainage, cough, or urinary symptoms.  She denies fevers or recurrent chills. She denies pain. She denies nausea, vomiting, chest pain, dyspnea or cough. Her appetite is good and her weight has decreased 5 pounds over last 3 weeks .  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 Report date is 12/12/2020.     The Multi-Cancer + RNA Panel offered by [facility] includes sequencing and/or deletion/duplication analysis of the following 84 genes:  AIP*, ALK, APC*, ATM*, AXIN2*, BAP1*, BARD1*, BLM*, BMPR1A*, BRCA1*, BRCA2*, BRIP1*, CASR, CDC73*, CDH1*, CDK4, CDKN1B*, CDKN1C*, CDKN2A, CEBPA, CHEK2*, CTNNA1*, DICER1*, DIS3L2*, EGFR, EPCAM, FH   GERD (gastroesophageal reflux disease)     Heartburn 12/27/2021   Hormone disorder 11/20/2020   Hypertension    Hypokalemia 12/27/2021   Leukocytosis 01/17/2022   Migraines    Nodule of left lung 11/21/2020   Non-small cell lung cancer, left (HCC) 01/15/2021   Tobacco use 12/07/2020   Past Surgical History:  Procedure Laterality Date   ABDOMINAL HYSTERECTOMY  1989   unsure of vaginal or abdominal   BRONCHIAL BIOPSY  01/15/2021   Procedure: BRONCHIAL BIOPSIES;  Surgeon: Leslye Peer, MD;  Location: [facility] ENDOSCOPY;  Service: Pulmonary;;   BRONCHIAL BRUSHINGS  01/15/2021   Procedure: BRONCHIAL BRUSHINGS;  Surgeon: Leslye Peer, MD;  Location: [facility] ENDOSCOPY;  Service: Pulmonary;;   BRONCHIAL NEEDLE ASPIRATION BIOPSY  01/15/2021   Procedure: BRONCHIAL NEEDLE ASPIRATION BIOPSIES;  Surgeon: Leslye Peer, MD;  Location: [facility] ENDOSCOPY;  Service: Pulmonary;;   COLONOSCOPY  2022   FIDUCIAL MARKER PLACEMENT  01/15/2021   Procedure: FIDUCIAL MARKER PLACEMENT;  Surgeon: Leslye Peer, MD;  Location: [facility] ENDOSCOPY;  Service: Pulmonary;;   ovaries removed  1986   surgery to remove scar tissue     from colon/bladder in patient's late 30's   TUBAL LIGATION  1986   UPPER GI ENDOSCOPY     years ago in patient's late 68s   VIDEO BRONCHOSCOPY WITH RADIAL ENDOBRONCHIAL ULTRASOUND  01/15/2021   Procedure: VIDEO BRONCHOSCOPY WITH RADIAL ENDOBRONCHIAL ULTRASOUND;  Surgeon: Leslye Peer, MD;  Location: [facility] ENDOSCOPY;  Service: Pulmonary;;   Family History  Problem Relation Age of Onset   Pancreatic cancer Maternal Aunt 50   Brain cancer Maternal Uncle    Stomach cancer Maternal Uncle        dx. >50   Prostate cancer Half-Brother        passed away at 37   Prostate cancer Half-Brother    Social History:  reports that she quit smoking about 10 months ago. Her smoking use included cigarettes. She started smoking about 61 years ago. She has a 29.50 pack-year smoking history. She has never used smokeless tobacco. She reports current alcohol  use of about 4.0 standard drinks of alcohol per week. She reports current drug use. Frequency: 3.00 times per week. Drug: Marijuana. She is widowed and lives at home.  She has quit smoking since April 12, when she had  her surgical resection.  She has 2 biological children and 1 stepchild. She is disabled, and has been exposed to chemicals or other toxic agents. She was previously employed at a hosiery, chicken farm and cleaning houses. 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   Allergies  Allergen Reactions   Codeine Rash   Demerol [Meperidine Hcl] Other (See Comments)    coma   Meperidine Other (See Comments)    Several days will pass without patient being aware   Other Dermatitis    Stainless steel / skin weeps   Silver Dermatitis    Sterling silver skin weeps   Gold Bond [Menthol-Zinc Oxide] Dermatitis    Skin weeps   Neosporin [Bacitracin-Polymyxin B] Dermatitis    Skin weeps   Wound Dressing Adhesive Other (See Comments)    CAUSES SKIN TEARS PER PATIENT. INSTEAD USE THE TEGADERM 34M WITH ADHESIVE FREE WINDOW OVER PORT.    Current Medications:       Current Outpatient Medications  Medication Sig Dispense Refill   albuterol (VENTOLIN HFA) 108 (90 Base) MCG/ACT inhaler Inhale 2 puffs into the lungs every 4 (four) hours as needed for wheezing or shortness of breath.       amitriptyline (ELAVIL) 10 MG tablet Take 10 mg by mouth at bedtime as needed for sleep.       Ascorbic Acid (VITAMIN C WITH ROSE HIPS) 500 MG tablet Take 500 mg by mouth daily.       aspirin 81 MG EC tablet Take 81 mg by mouth daily.       atorvastatin (LIPITOR) 40 MG tablet Take 40 mg by mouth daily.       b complex vitamins capsule Take 1 capsule by mouth daily. (Patient not taking: Reported on 01/17/2022)       Biotin 5000 MCG CAPS Take 5,000 mcg by mouth daily.       Calcium-Vitamin D-Vitamin K (CHEWABLE CALCIUM PO) Take 650 mg by mouth daily.       celecoxib (CELEBREX) 200 MG capsule  Take 200 mg by mouth daily.       fluconazole (DIFLUCAN) 100 MG tablet Take 1 tablet (100 mg total) by mouth daily. 10 tablet 0   gabapentin (NEURONTIN) 300 MG capsule Take 300 mg by mouth at bedtime.       hydrochlorothiazide (HYDRODIURIL) 25 MG tablet Take 25 mg by mouth every morning.       HYDROcodone-acetaminophen (NORCO/VICODIN) 5-325 MG tablet Take 1 tablet by mouth every 6 (six) hours as needed for moderate pain or severe pain. 30 tablet 0   levothyroxine (SYNTHROID) 50 MCG tablet Take 1 tablet (50 mcg total) by mouth daily before breakfast. 30 tablet 2   lidocaine-prilocaine (EMLA) cream Apply 1 Application topically as needed. 30 g 0   omeprazole (PRILOSEC) 40 MG capsule Take 1 capsule (40 mg total) by mouth daily. 30 capsule 2   ondansetron (ZOFRAN) 4 MG tablet Take 1 tablet (4 mg total) by mouth every 4 (four) hours as needed for nausea. 90 tablet 3   potassium chloride SA (KLOR-CON M) 20 MEQ tablet Take 20 mEq by mouth daily.       prochlorperazine (COMPAZINE) 10 MG tablet Take 1 tablet (10 mg total) by mouth every 6 (six) hours as needed for nausea or vomiting. 30 tablet 2   vitamin E 180 MG (400 UNITS) capsule Take 400 Units by mouth daily. (Patient not taking: Reported on 01/17/2022)  REVIEW OF SYSTEMS:  Review of Systems  Constitutional:  Positive for fatigue. Negative for appetite change, chills, diaphoresis, fever and unexpected weight change.  HENT:  Negative.  Negative for hearing loss, lump/mass, mouth sores, nosebleeds, sore throat, tinnitus, trouble swallowing and voice change.   Eyes: Negative.  Negative for eye problems and icterus.  Respiratory:  Positive for cough (dry). Negative for chest tightness, hemoptysis, shortness of breath and wheezing.   Cardiovascular: Negative.  Negative for chest pain, leg swelling and palpitations.  Gastrointestinal:  Positive for nausea (occasional could be due to missed medication.). Negative for abdominal distention, abdominal  pain, blood in stool, constipation, diarrhea, rectal pain and vomiting.  Endocrine: Negative.   Genitourinary: Negative.  Negative for bladder incontinence, difficulty urinating, dyspareunia, dysuria, frequency, hematuria, menstrual problem, nocturia, pelvic pain, vaginal bleeding and vaginal discharge.   Musculoskeletal: Negative.  Negative for arthralgias, back pain, flank pain, gait problem, myalgias, neck pain and neck stiffness.  Skin: Negative.  Negative for itching, rash and wound.  Neurological:  Negative for dizziness, extremity weakness, gait problem, headaches, light-headedness, numbness, seizures and speech difficulty.  Hematological: Negative.  Negative for adenopathy. Does not bruise/bleed easily.  Psychiatric/Behavioral: Negative.  Negative for confusion, decreased concentration, depression, sleep disturbance and suicidal ideas. The patient is not nervous/anxious.        Dissociative identity disorder   VITALS:  Blood pressure 126/87, pulse 65, temperature 98 F (36.7 C), temperature source Oral, resp. rate 17, height 4\' 11"  (1.499 m), weight 132 lb 4.8 oz (60 kg), SpO2 99 %.     Wt Readings from Last 3 Encounters:  03/04/22 131 lb (59.4 kg)  02/28/22 132 lb 4.8 oz (60 kg)  02/07/22 137 lb 3.2 oz (62.2 kg)    Body mass index is 26.72 kg/m.   Performance status (ECOG): 1 - Symptomatic but completely ambulatory   PHYSICAL EXAM:  Physical Exam Vitals and nursing note reviewed.  Constitutional:      General: She is not in acute distress.    Appearance: Normal appearance. She is normal weight. She is not ill-appearing, toxic-appearing or diaphoretic.  HENT:     Head: Normocephalic and atraumatic.     Right Ear: Tympanic membrane, ear canal and external ear normal. There is no impacted cerumen.     Left Ear: Tympanic membrane, ear canal and external ear normal. There is no impacted cerumen.     Nose: Nose normal. No congestion or rhinorrhea.     Mouth/Throat:     Mouth:  Mucous membranes are moist.     Pharynx: Oropharynx is clear. No oropharyngeal exudate or posterior oropharyngeal erythema.  Eyes:     General: No scleral icterus.       Right eye: No discharge.        Left eye: No discharge.     Extraocular Movements: Extraocular movements intact.     Conjunctiva/sclera: Conjunctivae normal.     Pupils: Pupils are equal, round, and reactive to light.  Neck:     Vascular: No carotid bruit.  Cardiovascular:     Rate and Rhythm: Normal rate and regular rhythm.     Pulses: Normal pulses.     Heart sounds: Normal heart sounds. No murmur heard.    No friction rub. No gallop.  Pulmonary:     Effort: Pulmonary effort is normal. No respiratory distress.     Breath sounds: Normal breath sounds. No stridor. No wheezing, rhonchi or rales.  Chest:     Chest wall: No  tenderness.  Abdominal:     General: Bowel sounds are normal. There is no distension.     Palpations: Abdomen is soft. There is no hepatomegaly, splenomegaly or mass.     Tenderness: There is no abdominal tenderness. There is no right CVA tenderness, left CVA tenderness, guarding or rebound.     Hernia: No hernia is present.  Musculoskeletal:        General: No swelling, tenderness, deformity or signs of injury. Normal range of motion.     Cervical back: Normal range of motion and neck supple. No rigidity or tenderness.     Right lower leg: No edema.     Left lower leg: No edema.  Lymphadenopathy:     Cervical: No cervical adenopathy.     Right cervical: No superficial, deep or posterior cervical adenopathy.    Left cervical: No superficial, deep or posterior cervical adenopathy.     Upper Body:     Right upper body: No supraclavicular, axillary or pectoral adenopathy.     Left upper body: No supraclavicular, axillary or pectoral adenopathy.  Skin:    General: Skin is warm and dry.     Coloration: Skin is not jaundiced or pale.     Findings: No bruising, erythema, lesion or rash.   Neurological:     General: No focal deficit present.     Mental Status: She is alert and oriented to person, place, and time. Mental status is at baseline.     Cranial Nerves: No cranial nerve deficit.     Sensory: No sensory deficit.     Motor: No weakness.     Coordination: Coordination normal.     Gait: Gait normal.     Deep Tendon Reflexes: Reflexes normal.  Psychiatric:        Mood and Affect: Mood normal.        Behavior: Behavior normal.        Thought Content: Thought content normal.        Judgment: Judgment normal.     LABS:    Component Ref Range & Units 2 d ago (07/02/22) 3 wk ago (06/11/22) 1 mo ago (05/23/22) 1 mo ago (05/16/22) 2 mo ago (05/02/22) 2 mo ago (04/11/22) 3 mo ago (03/19/22)  Hemoglobin 12.0 - 16.0 13.7 13.4 13.2 14.7 14.0 13.7 12.7  HCT 36 - 46 40 39 38 44 40 39 37  Neutrophils Absolute 5.51 4.93 4.55 4.70 5.54 6.90 7.13  Platelets 150 - 400 K/uL 283 286 308 305 326 322 318  WBC 10.2 8.8 9.1 8.6 9.9 11.5 12.5              Component Ref Range & Units 2 d ago (07/02/22) 3 wk ago (06/11/22) 1 mo ago (05/23/22) 1 mo ago (05/16/22) 2 mo ago (05/02/22) 2 mo ago (04/11/22) 3 mo ago (03/19/22)  Glucose 143 110 99 R, CM 105 90 91 R, CM 100  BUN 4 - 21 13 14 8  R 10 11 8  R 12  CO2 13 - 22 26 Abnormal  29 Abnormal  31 R 30 Abnormal  30 Abnormal  32 R 29 Abnormal   Creatinine 0.5 - 1.1 0.8 0.9 0.97 R 1.0 0.9 1.06 High  R 0.9  Potassium 3.5 - 5.1 mEq/L 4.5 3.4 Abnormal  3.0 Low  R 3.0 Abnormal  3.0 Abnormal  2.8 Low  R 3.2 Abnormal   Sodium 137 - 147 138 140 139 R 139 140 140 R 138  Chloride 99 -  108 103 101 100 R 94 Abnormal  101 98 R 101            Component Ref Range & Units 2 d ago (07/02/22) 3 wk ago (06/11/22) 1 mo ago (05/23/22) 1 mo ago (05/16/22) 2 mo ago (05/02/22) 2 mo ago (04/11/22) 3 mo ago (03/19/22)  Calcium 8.7 - 10.7 9.6 9.7 9.1 R 10.1 9.6 9.5 R 9.7  Albumin 3.5 - 5.0 4.2 4.1 3.9 R 4.6 4.2 4.4 R 4.3            Component Ref Range &  Units 2 d ago (07/02/22) 3 wk ago (06/11/22) 1 mo ago (05/23/22) 1 mo ago (05/16/22) 2 mo ago (05/02/22) 2 mo ago (04/11/22) 3 mo ago (03/19/22)  Alkaline Phosphatase 25 - 125 70 58 58 R 85 63 66 R 60  ALT 7 - 35 U/L 21 18 12  R 9 13 13  R 13  AST 13 - 35 24 36 Abnormal  19 R 16 28 22  R 21  Bilirubin, Total 1.3 1.5  0.9 0.9  1.5      Component Ref Range & Units 2 wk ago (06/11/22) 1 mo ago (05/23/22) 1 mo ago (05/02/22) 2 mo ago (04/11/22) 3 mo ago (02/28/22) 4 mo ago (02/07/22)  TSH 0.350 - 4.500 uIU/mL 2.618 14.880 High  CM 9.734 High  CM 16.977 High  CM 47.710 High  CM 201.807 High  CM     Component Ref Range & Units 2 wk ago (06/11/22) 1 mo ago (05/23/22) 1 mo ago (05/02/22) 2 mo ago (04/11/22) 3 mo ago (02/28/22) 4 mo ago (02/07/22) 5 mo ago (01/17/22)  T4, Total 4.5 - 12.0 ug/dL 16.1 7.1 CM 9.0 CM 9.0 CM 8.5 CM 1.3 Low  CM <0.4 Low  CM      Recent Labs       Lab Results  Component Value Date    CEA1 2.8 11/08/2021     /  Last Labs         CEA  Date Value Ref Range Status  11/08/2021 2.8 0.0 - 4.7 ng/mL Final      Comment:      (NOTE)                             Nonsmokers          <3.9                             Smokers             <5.6 [employer] Electrochemiluminescence Immunoassay (ECLIA) Values obtained with different assay methods or kits cannot be used interchangeably.  Results cannot be interpreted as absolute evidence of the presence or absence of malignant disease. Performed At: [facility] [address] [place], [place] [id number] Jolene Schimke MD WJ:[id number]        STUDIES:  Exam: 07/02/2022 CT Chest with Contrast Impression: Stable exam with numerous scattered bilateral tiny pulmonary nodules are not substantially changed in the interval. No definite new suspicious nodules or mass. Continued close attention on follow-up recommended.   Mild circumferential wall thickening in the distal esophagus, and stable in the interval.  Esophagitis would be a consideration. Aortic Atherosclerosis (ICD10-170.0) and Emphysema (ICD10-J43.9)   EXAM: 03/19/2022 CT CHEST WITH CONTRAST IMPRESSION Numerous small scattered bilateral pulmonary nodules are all stable, including persistent stable lingular nodules that  were new on her 11/23/2021 CT scan. The persistence of these nodules may indicate pulmonary metastases.  No new or progressive metastatic disease in the chest Trace basilar left pleural effusion decreased.  Aortic Atherosclerosis (ICD10-170.0) and Emphysema. ([id number]-U04.9)    I,Jasmine M Lassiter,acting as a scribe for Dellia Beckwith, MD.,have documented all relevant documentation on the behalf of Dellia Beckwith, MD,as directed by  Dellia Beckwith, MD while in the presence of Dellia Beckwith, MD.

## 2022-07-02 LAB — COMPREHENSIVE METABOLIC PANEL
Albumin: 4.2 (ref 3.5–5.0)
Calcium: 9.6 (ref 8.7–10.7)

## 2022-07-02 LAB — BASIC METABOLIC PANEL
BUN: 13 (ref 4–21)
CO2: 26 — AB (ref 13–22)
Chloride: 103 (ref 99–108)
Creatinine: 0.8 (ref 0.5–1.1)
EGFR (Non-African Amer.): >60
Glucose: 143
Potassium: 4.5 mEq/L (ref 3.5–5.1)
Sodium: 138 (ref 137–147)

## 2022-07-02 LAB — CBC AND DIFFERENTIAL
HCT: 40 (ref 36–46)
Hemoglobin: 13.7 (ref 12.0–16.0)
Neutrophils Absolute: 5.51
Platelets: 283 10*3/uL (ref 150–400)
WBC: 10.2

## 2022-07-02 LAB — HEPATIC FUNCTION PANEL
ALT: 21 U/L (ref 7–35)
AST: 24 (ref 13–35)
Alkaline Phosphatase: 70 (ref 25–125)
Bilirubin, Total: 1.3

## 2022-07-02 LAB — CBC: RBC: 4.22 (ref 3.87–5.11)

## 2022-07-04 ENCOUNTER — Encounter: Payer: Self-pay | Admitting: Oncology

## 2022-07-04 ENCOUNTER — Inpatient Hospital Stay: Payer: Medicare HMO | Attending: Hematology and Oncology | Admitting: Oncology

## 2022-07-04 ENCOUNTER — Other Ambulatory Visit: Payer: Self-pay | Admitting: Oncology

## 2022-07-04 ENCOUNTER — Other Ambulatory Visit: Payer: Medicare HMO

## 2022-07-04 VITALS — BP 99/79 | HR 68 | Temp 98.6°F | Resp 16 | Ht 59.0 in | Wt 115.1 lb

## 2022-07-04 DIAGNOSIS — J439 Emphysema, unspecified: Secondary | ICD-10-CM | POA: Insufficient documentation

## 2022-07-04 DIAGNOSIS — C3492 Malignant neoplasm of unspecified part of left bronchus or lung: Secondary | ICD-10-CM

## 2022-07-04 DIAGNOSIS — Z808 Family history of malignant neoplasm of other organs or systems: Secondary | ICD-10-CM | POA: Insufficient documentation

## 2022-07-04 DIAGNOSIS — E032 Hypothyroidism due to medicaments and other exogenous substances: Secondary | ICD-10-CM | POA: Diagnosis not present

## 2022-07-04 DIAGNOSIS — F4481 Dissociative identity disorder: Secondary | ICD-10-CM | POA: Insufficient documentation

## 2022-07-04 DIAGNOSIS — E876 Hypokalemia: Secondary | ICD-10-CM | POA: Insufficient documentation

## 2022-07-04 DIAGNOSIS — Z79899 Other long term (current) drug therapy: Secondary | ICD-10-CM | POA: Insufficient documentation

## 2022-07-04 DIAGNOSIS — Z5112 Encounter for antineoplastic immunotherapy: Secondary | ICD-10-CM | POA: Insufficient documentation

## 2022-07-04 DIAGNOSIS — Z79620 Long term (current) use of immunosuppressive biologic: Secondary | ICD-10-CM | POA: Insufficient documentation

## 2022-07-04 DIAGNOSIS — C3432 Malignant neoplasm of lower lobe, left bronchus or lung: Secondary | ICD-10-CM | POA: Insufficient documentation

## 2022-07-04 DIAGNOSIS — Z8042 Family history of malignant neoplasm of prostate: Secondary | ICD-10-CM | POA: Insufficient documentation

## 2022-07-04 DIAGNOSIS — Z800 Family history of malignant neoplasm of digestive organs: Secondary | ICD-10-CM | POA: Insufficient documentation

## 2022-07-04 DIAGNOSIS — F17210 Nicotine dependence, cigarettes, uncomplicated: Secondary | ICD-10-CM | POA: Insufficient documentation

## 2022-07-04 DIAGNOSIS — I700 Atherosclerosis of aorta: Secondary | ICD-10-CM | POA: Insufficient documentation

## 2022-07-05 MED FILL — Pembrolizumab IV Soln 100 MG/4ML (25 MG/ML): INTRAVENOUS | Qty: 8 | Status: AC

## 2022-07-08 ENCOUNTER — Inpatient Hospital Stay: Payer: Medicare HMO

## 2022-07-08 ENCOUNTER — Telehealth: Payer: Self-pay | Admitting: Oncology

## 2022-07-08 VITALS — BP 139/61 | HR 76 | Temp 97.8°F | Resp 14 | Ht 59.0 in | Wt 115.0 lb

## 2022-07-08 DIAGNOSIS — Z79899 Other long term (current) drug therapy: Secondary | ICD-10-CM | POA: Diagnosis not present

## 2022-07-08 DIAGNOSIS — C3492 Malignant neoplasm of unspecified part of left bronchus or lung: Secondary | ICD-10-CM

## 2022-07-08 DIAGNOSIS — C3432 Malignant neoplasm of lower lobe, left bronchus or lung: Secondary | ICD-10-CM | POA: Diagnosis present

## 2022-07-08 DIAGNOSIS — F4481 Dissociative identity disorder: Secondary | ICD-10-CM | POA: Diagnosis not present

## 2022-07-08 DIAGNOSIS — Z800 Family history of malignant neoplasm of digestive organs: Secondary | ICD-10-CM | POA: Diagnosis not present

## 2022-07-08 DIAGNOSIS — F17210 Nicotine dependence, cigarettes, uncomplicated: Secondary | ICD-10-CM | POA: Diagnosis not present

## 2022-07-08 DIAGNOSIS — J439 Emphysema, unspecified: Secondary | ICD-10-CM | POA: Diagnosis not present

## 2022-07-08 DIAGNOSIS — Z79620 Long term (current) use of immunosuppressive biologic: Secondary | ICD-10-CM | POA: Diagnosis not present

## 2022-07-08 DIAGNOSIS — Z5112 Encounter for antineoplastic immunotherapy: Secondary | ICD-10-CM | POA: Diagnosis present

## 2022-07-08 DIAGNOSIS — E876 Hypokalemia: Secondary | ICD-10-CM | POA: Diagnosis not present

## 2022-07-08 DIAGNOSIS — Z8042 Family history of malignant neoplasm of prostate: Secondary | ICD-10-CM | POA: Diagnosis not present

## 2022-07-08 DIAGNOSIS — I700 Atherosclerosis of aorta: Secondary | ICD-10-CM | POA: Diagnosis not present

## 2022-07-08 DIAGNOSIS — Z808 Family history of malignant neoplasm of other organs or systems: Secondary | ICD-10-CM | POA: Diagnosis not present

## 2022-07-08 MED ORDER — SODIUM CHLORIDE 0.9 % IV SOLN
200.0000 mg | Freq: Once | INTRAVENOUS | Status: AC
  Administered 2022-07-08: 200 mg via INTRAVENOUS
  Filled 2022-07-08: qty 8

## 2022-07-08 MED ORDER — SODIUM CHLORIDE 0.9 % IV SOLN: Freq: Once | INTRAVENOUS | Status: AC

## 2022-07-08 MED ORDER — HEPARIN SOD (PORK) LOCK FLUSH 100 UNIT/ML IV SOLN
500.0000 [IU] | Freq: Once | INTRAVENOUS | Status: AC | PRN
  Administered 2022-07-08: 500 [IU]

## 2022-07-08 MED ORDER — SODIUM CHLORIDE 0.9% FLUSH
10.0000 mL | INTRAVENOUS | Status: DC | PRN
  Administered 2022-07-08: 10 mL

## 2022-07-08 NOTE — Patient Instructions (Signed)

## 2022-07-08 NOTE — Telephone Encounter (Signed)
07/08/22 Spoke with patient and scheduled next appt

## 2022-07-09 ENCOUNTER — Other Ambulatory Visit: Payer: Self-pay

## 2022-07-10 ENCOUNTER — Encounter: Payer: Self-pay | Admitting: Oncology

## 2022-07-10 ENCOUNTER — Ambulatory Visit: Payer: Medicare HMO

## 2022-07-14 ENCOUNTER — Encounter: Payer: Self-pay | Admitting: Oncology

## 2022-07-19 NOTE — Progress Notes (Signed)
[facility]  [address] 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 The solitary left lower lobe nodule enlarged from 1.2 cm in September to 1.4 x 1.9 cm by December of 2022. She met with Dr. Cliffton Asters for consultation, and he recommended SBRT as he felt that her smoking would cause excessive risk for postoperative complications.  She sought another opinion with  Dr. Maryan Rued in [place], who agreed with surgical management. Her daughter lives in [place].  She had her surgery on April 12 with left thoracotomy and left lower lobe lobectomy along with mediastinal lymph node dissection.  Pathology revealed an adenocarcinoma grade 2 measuring 2.5 cm in diameter but with no evidence of visceral pleural invasion or direct invasion of adjacent structures.  All margins are negative for invasive carcinoma.  She did have involvement of 2 out of 20 lymph nodes for a T1c N1 M0, stage IIB.  EGFR and ALK mutations were negative but PD-L1 was 90%.  Her doctor recommended chemotherapy with immunotherapy but she refused chemotherapy.  She was only willing to take the immunotherapy and we recommended 1 year.  She had her first dose on September 7 and her second on September 28.  She then relocated to [facility] and we have continued this regimen, and 1 year will be completed in September, 2024.     Tobacco abuse.  She has not smoked at all since April 12 when she had her surgery. Patient informed me that she started back smoking cigarettes but states that she is trying to quit again.    3.    Dissociative identity disorder with 3 personalities. She continues to follow with a psychiatrist.    4.    Hypokalemia.  Potassium was down to 3.0 last month and so she was on  supplement now 2 pills 3 times daily. Her level has increased to 4.5 today so she can decrease to 2 pills BID.    Plan:  Her last colonoscopy was 4 years ago and there were polyps found and recommended follow-up. I advised her to wait until we are done with treatment first. She had a CT scan on 07/02/2022 that revealed a stable exam with numerous scattered bilateral tiny pulmonary nodules which are not substantially changed in the interval. There are no definite new suspicious nodules or mass, but she has mild circumferential wall thickening in the distal esophagus, stable in the interval. She has a right hilar node which has increased slightly from 6mm to 9mm and a left hilar node is stable at 8mm. She complains of food sometimes sticking in her mid chest. I think she will need an EGD in addition to colonoscopy in the Fall. Her last mammogram was on 10/13/2020 and I will schedule her annual screening bilateral mammogram. She informed me that she forgot to take her all of her medications 2 days ago on Tuesday. Her day 1 cycle 14 of Keytruda is scheduled on 07/29/2022. Her labs today are pending. She continues to take oral potassium supplements, two in the morning and two in the evening. I will see her back in 3 weeks with CBC, CMP, TSH, and T4. We will plan repeat scans in September or October.  She understands and agrees with this plan  of care. I have answered her questions and she knows to call with any concerns.    I provided 20 minutes of face-to-face time during this this encounter and > 50% was spent counseling as documented under my assessment and plan.      Dellia Beckwith, MD [facility] [address] [place] [address] Dept: [phone] Dept Fax: [phone]      CHIEF COMPLAINT:  CC: Adenocarcinoma of the left lung   Current Treatment:  Postoperative adjuvant immunotherapy   HISTORY OF PRESENT ILLNESS:  Sydney Hunt is a 62 y.o. female with a history of tobacco abuse who has adenocarcinoma of the left lung. This was originally found on lung cancer screening CT from September 2022. PET imaging was obtained in October which revealed a solitary 1.25 cm left lower lobe pulmonary nodule to be hypermetabolic and consistent with small primary lung neoplasm. No findings for mediastinal or hilar adenopathy or metastatic disease elsewhere was observed. She met with the genetic counselor and [facility] diagnostic testing was negative for any clinically significant mutations. CT chest from December revealed an enlarging left lower lobe nodule, measuring 1.4 x 1.9 cm. The additional smaller pulmonary nodules remained unchanged. She was referred to Dr. 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 Immunohistochemical stains were positive for TTF-1, and negative for p63 and CK5/6. 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 Her FEV1 was 1.98, which is 88% of predicted and her FVC was 2.58, which is 89% of predicted. She was deemed a candidate for resection and met with Dr. Cliffton Asters for consultation. As she is not motivated to quit smoking, he felt that surgery would present an excessive risk for postoperative respiratory complications. He recommended SBRT as a good option if the patient was unable to quit smoking.  The patient went for a second opinion to Dr. Lucienne Minks and surgical resection was carried out in [place] on April 12 with a left lower lobe lobectomy.  She was hospitalized for 13 days and has not smoked since that time.  Pathology revealed a 2.5 cm adenocarcinoma, grade 2.  There was no evidence of pleural invasion and margins are clear but she tells me 2 of 20 nodes were positive, for a T1c N1 M0, stage IIB.  A port was placed on August 16 and she was started on pembrolizumab on September 7 and had  her second dose on September 28. She refused chemotherapy. We recommend a full year of the pembrolizumab.   I have reviewed her chart and materials related to her cancer extensively and collaborated history with the patient. Summary of oncologic history is as follows:    Oncology History  Non-small cell lung cancer, left (HCC)  01/15/2021 Initial Diagnosis    Non-small cell lung cancer, left (HCC)    02/12/2021 Cancer Staging    Staging form: Lung, AJCC 8th Edition - Clinical stage from 02/12/2021: Stage IA2 (cT1b, cN0, cM0) - Signed by Dellia Beckwith, MD on 02/14/2021 Histopathologic type: Adenocarcinoma, NOS Stage prefix: Initial diagnosis Histologic grade (G): GX Histologic grading system: 4 grade system Laterality: Left Tumor size (mm): 19 Lymph-vascular invasion (LVI): LVI not present (absent)/not identified Diagnostic confirmation: Positive histology Specimen type: Bronchial Biopsy Staged by: Managing physician Stage used in treatment planning: Yes National guidelines used in treatment planning: Yes Type of national guideline used in treatment planning: NCCN Staging  comments: Rec surgical resection    05/11/2021 Cancer Staging    Staging form: Lung, AJCC 8th Edition - Pathologic stage from 05/11/2021: Stage IIB (ypT1c, pN1, cM0) - Signed by Dellia Beckwith, MD on 11/12/2021 Histopathologic type: Adenocarcinoma, NOS Stage prefix: Post-therapy Histologic grade (G): G2 Histologic grading system: 4 grade system Residual tumor (R): R0 - None Laterality: Left Tumor size (mm): 25 Lymph-vascular invasion (LVI): LVI not present (absent)/not identified Diagnostic confirmation: Positive histology PLUS positive immunophenotyping and/or positive genetic studies Specimen type: Excision Staged by: Managing physician Type of lung cancer: Surgically resected non-small cell lung cancer ECOG performance status: Grade 1 Perineural invasion (PNI): Absent Pleural/elastic layer  invasion: PL0 Pleural lavage cytology: Unknown Weight loss: Absent Adequacy of mediastinal dissection: Adequate Radiotherapy dose: No Adjuvant radiation: No Adjuvant chemotherapy: Yes Stage used in treatment planning: Yes National guidelines used in treatment planning: Yes Type of national guideline used in treatment planning: NCCN Staging comments: Adjuvant pembrolizumab, PDL 1 90%    10/04/2021 -  Chemotherapy    Patient is on Treatment Plan : LUNG NSCLC Pembrolizumab Adj q21d x 18 cycles      INTERVAL HISTORY:  Vonceil is here today for a routine follow up for adenocarcinoma of the left lung. Patient states that she feels well and has no complaints of pain. She continues to smoke occasionally and has a new support system to stop smoking. She was contacted by a doctor at [facility] to schedule a colonoscopy. Her last colonoscopy was 4 years ago and there were polyps found and recommended follow-up. I advised her to wait until we are done with treatment first. She had a CT scan on 07/02/2022 that revealed a stable exam with numerous scattered bilateral tiny pulmonary nodules which are not substantially changed in the interval. There are no definite new suspicious nodules or mass, but she has mild circumferential wall thickening in the distal esophagus, stable in the interval. She has a right hilar node which has increased slightly from 6mm to 9mm and a left hilar node is stable at 8mm. She complains of food sometimes sticking in her mid chest. I think she will need an EGD in addition to colonoscopy in the Fall. Her last mammogram was on 10/13/2020 and I will schedule her annual screening bilateral mammogram. She informed me that she forgot to take her all of her medications 2 days ago on Tuesday. Her day 1 cycle 14 of Keytruda is scheduled on 07/29/2022. Her labs today are pending. She continues to take a oral potassium supplements two in the morning and two in the evening. I will see  her back in 3 weeks with CBC, CMP, TSH, and T4. We will plan repeat scans in September or October. She denies signs of infection such as sore throat, sinus drainage, cough, or urinary symptoms.  She denies fevers or recurrent chills. She denies pain. She denies nausea, vomiting, chest pain, dyspnea or cough. Her appetite is good and she eats when she wants and her weight has decreased 2 pounds over last 3 weeks .  HISTORY:   Past Medical History:  Diagnosis Date   Allergy    Arthritis    Complication of anesthesia    slow to wake up   COPD (chronic obstructive pulmonary disease) (HCC)    COPD, mild (HCC) 12/15/2020   Family history of pancreatic cancer 11/28/2020   Family history of prostate cancer 11/28/2020   Family history of stomach cancer 11/28/2020   Genetic testing 12/18/2020  [facility] Multi-Cancer Panel was Negative. Report date is 12/12/2020.     The Multi-Cancer + RNA Panel offered by [facility] includes sequencing and/or deletion/duplication analysis of the following 84 genes:  AIP*, ALK, APC*, ATM*, AXIN2*, BAP1*, BARD1*, BLM*, BMPR1A*, BRCA1*, BRCA2*, BRIP1*, CASR, CDC73*, CDH1*, CDK4, CDKN1B*, CDKN1C*, CDKN2A, CEBPA, CHEK2*, CTNNA1*, DICER1*, DIS3L2*, EGFR, EPCAM, FH   GERD (gastroesophageal reflux disease)    Heartburn 12/27/2021   Hormone disorder 11/20/2020   Hypertension    Hypokalemia 12/27/2021   Leukocytosis 01/17/2022   Migraines    Nodule of left lung 11/21/2020   Non-small cell lung cancer, left (HCC) 01/15/2021   Tobacco use 12/07/2020   Past Surgical History:  Procedure Laterality Date   ABDOMINAL HYSTERECTOMY  1989   unsure of vaginal or abdominal   BRONCHIAL BIOPSY  01/15/2021   Procedure: BRONCHIAL BIOPSIES;  Surgeon: Leslye Peer, MD;  Location: [facility] ENDOSCOPY;  Service: Pulmonary;;   BRONCHIAL BRUSHINGS  01/15/2021   Procedure: BRONCHIAL BRUSHINGS;  Surgeon: Leslye Peer, MD;  Location: [facility] ENDOSCOPY;  Service: Pulmonary;;   BRONCHIAL NEEDLE  ASPIRATION BIOPSY  01/15/2021   Procedure: BRONCHIAL NEEDLE ASPIRATION BIOPSIES;  Surgeon: Leslye Peer, MD;  Location: [facility] ENDOSCOPY;  Service: Pulmonary;;   COLONOSCOPY  2022   FIDUCIAL MARKER PLACEMENT  01/15/2021   Procedure: FIDUCIAL MARKER PLACEMENT;  Surgeon: Leslye Peer, MD;  Location: [facility] ENDOSCOPY;  Service: Pulmonary;;   ovaries removed  1986   surgery to remove scar tissue     from colon/bladder in patient's late 30's   TUBAL LIGATION  1986   UPPER GI ENDOSCOPY     years ago in patient's late 59s   VIDEO BRONCHOSCOPY WITH RADIAL ENDOBRONCHIAL ULTRASOUND  01/15/2021   Procedure: VIDEO BRONCHOSCOPY WITH RADIAL ENDOBRONCHIAL ULTRASOUND;  Surgeon: Leslye Peer, MD;  Location: [facility] ENDOSCOPY;  Service: Pulmonary;;   Family History  Problem Relation Age of Onset   Pancreatic cancer Maternal Aunt 50   Brain cancer Maternal Uncle    Stomach cancer Maternal Uncle        dx. >50   Prostate cancer Half-Brother        passed away at 80   Prostate cancer Half-Brother    Social History:  reports that she quit smoking about 10 months ago. Her smoking use included cigarettes. She started smoking about 61 years ago. She has a 29.50 pack-year smoking history. She has never used smokeless tobacco. She reports current alcohol use of about 4.0 standard drinks of alcohol per week. She reports current drug use. Frequency: 3.00 times per week. Drug: Marijuana. She is widowed and lives at home.  She has quit smoking since April 12, when she had her surgical resection.  She has 2 biological children and 1 stepchild. She is disabled, and has been exposed to chemicals or other toxic agents. She was previously employed at a hosiery, chicken farm and cleaning houses. 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   Allergies  Allergen Reactions   Codeine Rash   Demerol [Meperidine Hcl] Other (See Comments)    coma   Meperidine Other (See Comments)    Several days will pass  without patient being aware   Other Dermatitis    Stainless steel / skin weeps   Silver Dermatitis    Sterling silver skin weeps   Gold Bond [Menthol-Zinc Oxide] Dermatitis    Skin weeps   Neosporin [Bacitracin-Polymyxin B] Dermatitis    Skin weeps   Wound Dressing Adhesive  Other (See Comments)    CAUSES SKIN TEARS PER PATIENT. INSTEAD USE THE TEGADERM 26M WITH ADHESIVE FREE WINDOW OVER PORT.    Current Medications:       Current Outpatient Medications  Medication Sig Dispense Refill   albuterol (VENTOLIN HFA) 108 (90 Base) MCG/ACT inhaler Inhale 2 puffs into the lungs every 4 (four) hours as needed for wheezing or shortness of breath.       amitriptyline (ELAVIL) 10 MG tablet Take 10 mg by mouth at bedtime as needed for sleep.       Ascorbic Acid (VITAMIN C WITH ROSE HIPS) 500 MG tablet Take 500 mg by mouth daily.       aspirin 81 MG EC tablet Take 81 mg by mouth daily.       atorvastatin (LIPITOR) 40 MG tablet Take 40 mg by mouth daily.       b complex vitamins capsule Take 1 capsule by mouth daily. (Patient not taking: Reported on 01/17/2022)       Biotin 5000 MCG CAPS Take 5,000 mcg by mouth daily.       Calcium-Vitamin D-Vitamin K (CHEWABLE CALCIUM PO) Take 650 mg by mouth daily.       celecoxib (CELEBREX) 200 MG capsule Take 200 mg by mouth daily.       fluconazole (DIFLUCAN) 100 MG tablet Take 1 tablet (100 mg total) by mouth daily. 10 tablet 0   gabapentin (NEURONTIN) 300 MG capsule Take 300 mg by mouth at bedtime.       hydrochlorothiazide (HYDRODIURIL) 25 MG tablet Take 25 mg by mouth every morning.       HYDROcodone-acetaminophen (NORCO/VICODIN) 5-325 MG tablet Take 1 tablet by mouth every 6 (six) hours as needed for moderate pain or severe pain. 30 tablet 0   levothyroxine (SYNTHROID) 50 MCG tablet Take 1 tablet (50 mcg total) by mouth daily before breakfast. 30 tablet 2   lidocaine-prilocaine (EMLA) cream Apply 1 Application topically as needed. 30 g 0   omeprazole  (PRILOSEC) 40 MG capsule Take 1 capsule (40 mg total) by mouth daily. 30 capsule 2   ondansetron (ZOFRAN) 4 MG tablet Take 1 tablet (4 mg total) by mouth every 4 (four) hours as needed for nausea. 90 tablet 3   potassium chloride SA (KLOR-CON M) 20 MEQ tablet Take 20 mEq by mouth daily.       prochlorperazine (COMPAZINE) 10 MG tablet Take 1 tablet (10 mg total) by mouth every 6 (six) hours as needed for nausea or vomiting. 30 tablet 2   vitamin E 180 MG (400 UNITS) capsule Take 400 Units by mouth daily. (Patient not taking: Reported on 01/17/2022)        REVIEW OF SYSTEMS:  Review of Systems  Constitutional:  Positive for fatigue. Negative for appetite change, chills, diaphoresis, fever and unexpected weight change.  HENT:  Negative.  Negative for hearing loss, lump/mass, mouth sores, nosebleeds, sore throat, tinnitus, trouble swallowing and voice change.   Eyes: Negative.  Negative for eye problems and icterus.  Respiratory:  Positive for cough (dry). Negative for chest tightness, hemoptysis, shortness of breath and wheezing.   Cardiovascular: Negative.  Negative for chest pain, leg swelling and palpitations.  Gastrointestinal:  Positive for nausea (occasional could be due to missed medication.). Negative for abdominal distention, abdominal pain, blood in stool, constipation, diarrhea, rectal pain and vomiting.  Endocrine: Negative.   Genitourinary: Negative.  Negative for bladder incontinence, difficulty urinating, dyspareunia, dysuria, frequency, hematuria, menstrual problem, nocturia, pelvic pain,  vaginal bleeding and vaginal discharge.   Musculoskeletal: Negative.  Negative for arthralgias, back pain, flank pain, gait problem, myalgias, neck pain and neck stiffness.  Skin: Negative.  Negative for itching, rash and wound.  Neurological:  Negative for dizziness, extremity weakness, gait problem, headaches, light-headedness, numbness, seizures and speech difficulty.  Hematological: Negative.   Negative for adenopathy. Does not bruise/bleed easily.  Psychiatric/Behavioral: Negative.  Negative for confusion, decreased concentration, depression, sleep disturbance and suicidal ideas. The patient is not nervous/anxious.        Dissociative identity disorder   VITALS:  Blood pressure 126/87, pulse 65, temperature 98 F (36.7 C), temperature source Oral, resp. rate 17, height 4\' 11"  (1.499 m), weight 132 lb 4.8 oz (60 kg), SpO2 99 %.     Wt Readings from Last 3 Encounters:  03/04/22 131 lb (59.4 kg)  02/28/22 132 lb 4.8 oz (60 kg)  02/07/22 137 lb 3.2 oz (62.2 kg)    Body mass index is 26.72 kg/m.   Performance status (ECOG): 1 - Symptomatic but completely ambulatory   PHYSICAL EXAM:  Physical Exam Vitals and nursing note reviewed.  Constitutional:      General: She is not in acute distress.    Appearance: Normal appearance. She is normal weight. She is not ill-appearing, toxic-appearing or diaphoretic.  HENT:     Head: Normocephalic and atraumatic.     Right Ear: Tympanic membrane, ear canal and external ear normal. There is no impacted cerumen.     Left Ear: Tympanic membrane, ear canal and external ear normal. There is no impacted cerumen.     Nose: Nose normal. No congestion or rhinorrhea.     Mouth/Throat:     Mouth: Mucous membranes are moist.     Pharynx: Oropharynx is clear. No oropharyngeal exudate or posterior oropharyngeal erythema.  Eyes:     General: No scleral icterus.       Right eye: No discharge.        Left eye: No discharge.     Extraocular Movements: Extraocular movements intact.     Conjunctiva/sclera: Conjunctivae normal.     Pupils: Pupils are equal, round, and reactive to light.  Neck:     Vascular: No carotid bruit.  Cardiovascular:     Rate and Rhythm: Normal rate and regular rhythm.     Pulses: Normal pulses.     Heart sounds: Normal heart sounds. No murmur heard.    No friction rub. No gallop.  Pulmonary:     Effort: Pulmonary effort is  normal. No respiratory distress.     Breath sounds: Normal breath sounds. No stridor. No wheezing, rhonchi or rales.  Chest:     Chest wall: No tenderness.  Abdominal:     General: Bowel sounds are normal. There is no distension.     Palpations: Abdomen is soft. There is no hepatomegaly, splenomegaly or mass.     Tenderness: There is no abdominal tenderness. There is no right CVA tenderness, left CVA tenderness, guarding or rebound.     Hernia: No hernia is present.  Musculoskeletal:        General: No swelling, tenderness, deformity or signs of injury. Normal range of motion.     Cervical back: Normal range of motion and neck supple. No rigidity or tenderness.     Right lower leg: No edema.     Left lower leg: No edema.  Lymphadenopathy:     Cervical: No cervical adenopathy.     Right cervical: No superficial,  deep or posterior cervical adenopathy.    Left cervical: No superficial, deep or posterior cervical adenopathy.     Upper Body:     Right upper body: No supraclavicular, axillary or pectoral adenopathy.     Left upper body: No supraclavicular, axillary or pectoral adenopathy.  Skin:    General: Skin is warm and dry.     Coloration: Skin is not jaundiced or pale.     Findings: No bruising, erythema, lesion or rash.  Neurological:     General: No focal deficit present.     Mental Status: She is alert and oriented to person, place, and time. Mental status is at baseline.     Cranial Nerves: No cranial nerve deficit.     Sensory: No sensory deficit.     Motor: No weakness.     Coordination: Coordination normal.     Gait: Gait normal.     Deep Tendon Reflexes: Reflexes normal.  Psychiatric:        Mood and Affect: Mood normal.        Behavior: Behavior normal.        Thought Content: Thought content normal.        Judgment: Judgment normal.     LABS:    Component Ref Range & Units 2 d ago (07/02/22) 3 wk ago (06/11/22) 1 mo ago (05/23/22) 1 mo ago (05/16/22) 2 mo  ago (05/02/22) 2 mo ago (04/11/22) 3 mo ago (03/19/22)  Hemoglobin 12.0 - 16.0 13.7 13.4 13.2 14.7 14.0 13.7 12.7  HCT 36 - 46 40 39 38 44 40 39 37  Neutrophils Absolute 5.51 4.93 4.55 4.70 5.54 6.90 7.13  Platelets 150 - 400 K/uL 283 286 308 305 326 322 318  WBC 10.2 8.8 9.1 8.6 9.9 11.5 12.5              Component Ref Range & Units 2 d ago (07/02/22) 3 wk ago (06/11/22) 1 mo ago (05/23/22) 1 mo ago (05/16/22) 2 mo ago (05/02/22) 2 mo ago (04/11/22) 3 mo ago (03/19/22)  Glucose 143 110 99 R, CM 105 90 91 R, CM 100  BUN 4 - 21 13 14 8  R 10 11 8  R 12  CO2 13 - 22 26 Abnormal  29 Abnormal  31 R 30 Abnormal  30 Abnormal  32 R 29 Abnormal   Creatinine 0.5 - 1.1 0.8 0.9 0.97 R 1.0 0.9 1.06 High  R 0.9  Potassium 3.5 - 5.1 mEq/L 4.5 3.4 Abnormal  3.0 Low  R 3.0 Abnormal  3.0 Abnormal  2.8 Low  R 3.2 Abnormal   Sodium 137 - 147 138 140 139 R 139 140 140 R 138  Chloride 99 - 108 103 101 100 R 94 Abnormal  101 98 R 101            Component Ref Range & Units 2 d ago (07/02/22) 3 wk ago (06/11/22) 1 mo ago (05/23/22) 1 mo ago (05/16/22) 2 mo ago (05/02/22) 2 mo ago (04/11/22) 3 mo ago (03/19/22)  Calcium 8.7 - 10.7 9.6 9.7 9.1 R 10.1 9.6 9.5 R 9.7  Albumin 3.5 - 5.0 4.2 4.1 3.9 R 4.6 4.2 4.4 R 4.3            Component Ref Range & Units 2 d ago (07/02/22) 3 wk ago (06/11/22) 1 mo ago (05/23/22) 1 mo ago (05/16/22) 2 mo ago (05/02/22) 2 mo ago (04/11/22) 3 mo ago (03/19/22)  Alkaline Phosphatase 25 - 125 70 58  58 R 85 63 66 R 60  ALT 7 - 35 U/L 21 18 12  R 9 13 13  R 13  AST 13 - 35 24 36 Abnormal  19 R 16 28 22  R 21  Bilirubin, Total 1.3 1.5  0.9 0.9  1.5      Component Ref Range & Units 2 wk ago (06/11/22) 1 mo ago (05/23/22) 1 mo ago (05/02/22) 2 mo ago (04/11/22) 3 mo ago (02/28/22) 4 mo ago (02/07/22)  TSH 0.350 - 4.500 uIU/mL 2.618 14.880 High  CM 9.734 High  CM 16.977 High  CM 47.710 High  CM 201.807 High  CM     Component Ref Range & Units 2 wk ago (06/11/22) 1 mo  ago (05/23/22) 1 mo ago (05/02/22) 2 mo ago (04/11/22) 3 mo ago (02/28/22) 4 mo ago (02/07/22) 5 mo ago (01/17/22)  T4, Total 4.5 - 12.0 ug/dL 16.1 7.1 CM 9.0 CM 9.0 CM 8.5 CM 1.3 Low  CM <0.4 Low  CM      Recent Labs       Lab Results  Component Value Date    CEA1 2.8 11/08/2021     /  Last Labs         CEA  Date Value Ref Range Status  11/08/2021 2.8 0.0 - 4.7 ng/mL Final      Comment:      (NOTE)                             Nonsmokers          <3.9                             Smokers             <5.6 [employer] Electrochemiluminescence Immunoassay (ECLIA) Values obtained with different assay methods or kits cannot be used interchangeably.  Results cannot be interpreted as absolute evidence of the presence or absence of malignant disease. Performed At: [facility] [address] [place], [place] [id number] Jolene Schimke MD WJ:[id number]        STUDIES:  Exam: 07/02/2022 CT Chest with Contrast Impression: Stable exam with numerous scattered bilateral tiny pulmonary nodules are not substantially changed in the interval. No definite new suspicious nodules or mass. Continued close attention on follow-up recommended.   Mild circumferential wall thickening in the distal esophagus, and stable in the interval. Esophagitis would be a consideration. Aortic Atherosclerosis (ICD10-170.0) and Emphysema (ICD10-J43.9)   EXAM: 03/19/2022 CT CHEST WITH CONTRAST IMPRESSION Numerous small scattered bilateral pulmonary nodules are all stable, including persistent stable lingular nodules that were new on her 11/23/2021 CT scan. The persistence of these nodules may indicate pulmonary metastases.  No new or progressive metastatic disease in the chest Trace basilar left pleural effusion decreased.  Aortic Atherosclerosis (ICD10-170.0) and Emphysema. ([id number])    I,Jasmine M Lassiter,acting as a scribe for Dellia Beckwith, MD.,have documented all relevant  documentation on the behalf of Dellia Beckwith, MD,as directed by  Dellia Beckwith, MD while in the presence of Dellia Beckwith, MD.

## 2022-07-25 ENCOUNTER — Other Ambulatory Visit: Payer: Self-pay | Admitting: Oncology

## 2022-07-25 ENCOUNTER — Inpatient Hospital Stay: Payer: Medicare HMO

## 2022-07-25 ENCOUNTER — Inpatient Hospital Stay (INDEPENDENT_AMBULATORY_CARE_PROVIDER_SITE_OTHER): Payer: Medicare HMO | Admitting: Oncology

## 2022-07-25 ENCOUNTER — Encounter: Payer: Self-pay | Admitting: Oncology

## 2022-07-25 VITALS — BP 100/73 | HR 76 | Temp 97.8°F | Resp 18 | Ht 59.0 in | Wt 113.9 lb

## 2022-07-25 DIAGNOSIS — C3492 Malignant neoplasm of unspecified part of left bronchus or lung: Secondary | ICD-10-CM

## 2022-07-25 DIAGNOSIS — Z5112 Encounter for antineoplastic immunotherapy: Secondary | ICD-10-CM | POA: Diagnosis not present

## 2022-07-25 DIAGNOSIS — Z95828 Presence of other vascular implants and grafts: Secondary | ICD-10-CM

## 2022-07-25 DIAGNOSIS — Z1239 Encounter for other screening for malignant neoplasm of breast: Secondary | ICD-10-CM

## 2022-07-25 LAB — CBC AND DIFFERENTIAL
HCT: 39 (ref 36–46)
Hemoglobin: 13.4 (ref 12.0–16.0)
Neutrophils Absolute: 4.94
Platelets: 280 10*3/uL (ref 150–400)
WBC: 10.5

## 2022-07-25 LAB — CMP (CANCER CENTER ONLY)
ALT: 11 U/L (ref 0–44)
AST: 18 U/L (ref 15–41)
Albumin: 3.9 g/dL (ref 3.5–5.0)
Alkaline Phosphatase: 72 U/L (ref 38–126)
Anion gap: 9 (ref 5–15)
BUN: 14 mg/dL (ref 8–23)
CO2: 26 mmol/L (ref 22–32)
Calcium: 9.3 mg/dL (ref 8.9–10.3)
Chloride: 105 mmol/L (ref 98–111)
Creatinine: 0.96 mg/dL (ref 0.44–1.00)
GFR, Estimated: >60 mL/min (ref >60)
Glucose, Bld: 126 mg/dL — ABNORMAL HIGH (ref 70–99)
Potassium: 4.1 mmol/L (ref 3.5–5.1)
Sodium: 140 mmol/L (ref 135–145)
Total Bilirubin: 1.2 mg/dL (ref 0.3–1.2)
Total Protein: 7.4 g/dL (ref 6.5–8.1)

## 2022-07-25 LAB — CBC W DIFFERENTIAL (~~LOC~~ CC SCANNED REPORT)

## 2022-07-25 LAB — TSH: TSH: 0.474 u[IU]/mL (ref 0.350–4.500)

## 2022-07-25 LAB — CBC: RBC: 4.09 (ref 3.87–5.11)

## 2022-07-26 LAB — T4: T4, Total: 10.2 ug/dL (ref 4.5–12.0)

## 2022-07-26 MED FILL — Pembrolizumab IV Soln 100 MG/4ML (25 MG/ML): INTRAVENOUS | Qty: 8 | Status: AC

## 2022-07-29 ENCOUNTER — Inpatient Hospital Stay: Payer: Medicare HMO | Attending: Hematology and Oncology

## 2022-07-29 VITALS — BP 97/71 | HR 86 | Temp 97.7°F | Resp 16 | Ht 59.0 in | Wt 112.1 lb

## 2022-07-29 DIAGNOSIS — Z79899 Other long term (current) drug therapy: Secondary | ICD-10-CM | POA: Insufficient documentation

## 2022-07-29 DIAGNOSIS — C3432 Malignant neoplasm of lower lobe, left bronchus or lung: Secondary | ICD-10-CM | POA: Diagnosis present

## 2022-07-29 DIAGNOSIS — C3492 Malignant neoplasm of unspecified part of left bronchus or lung: Secondary | ICD-10-CM

## 2022-07-29 DIAGNOSIS — Z5112 Encounter for antineoplastic immunotherapy: Secondary | ICD-10-CM | POA: Diagnosis present

## 2022-07-29 DIAGNOSIS — Z79620 Long term (current) use of immunosuppressive biologic: Secondary | ICD-10-CM | POA: Diagnosis not present

## 2022-07-29 MED ORDER — HEPARIN SOD (PORK) LOCK FLUSH 100 UNIT/ML IV SOLN
500.0000 [IU] | Freq: Once | INTRAVENOUS | Status: AC | PRN
  Administered 2022-07-29: 500 [IU]

## 2022-07-29 MED ORDER — SODIUM CHLORIDE 0.9% FLUSH
10.0000 mL | INTRAVENOUS | Status: DC | PRN
  Administered 2022-07-29: 10 mL

## 2022-07-29 MED ORDER — SODIUM CHLORIDE 0.9 % IV SOLN
200.0000 mg | Freq: Once | INTRAVENOUS | Status: AC
  Administered 2022-07-29: 200 mg via INTRAVENOUS
  Filled 2022-07-29: qty 8

## 2022-07-29 MED ORDER — SODIUM CHLORIDE 0.9 % IV SOLN: Freq: Once | INTRAVENOUS | Status: AC

## 2022-07-29 NOTE — Patient Instructions (Signed)

## 2022-08-02 ENCOUNTER — Encounter: Payer: Self-pay | Admitting: Oncology

## 2022-08-05 ENCOUNTER — Encounter: Payer: Self-pay | Admitting: Oncology

## 2022-08-08 NOTE — Progress Notes (Signed)
[facility]  [address] 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 The solitary left lower lobe nodule enlarged from 1.2 cm in September to 1.4 x 1.9 cm by December of 2022. She met with Dr. Cliffton Asters for consultation, and he recommended SBRT as he felt that her smoking would cause excessive risk for postoperative complications.  She sought another opinion with  Dr. Maryan Rued in [place], who agreed with surgical management. Her daughter lives in [place].  She had her surgery on April 12 with left thoracotomy and left lower lobe lobectomy along with mediastinal lymph node dissection.  Pathology revealed an adenocarcinoma grade 2 measuring 2.5 cm in diameter but with no evidence of visceral pleural invasion or direct invasion of adjacent structures.  All margins are negative for invasive carcinoma.  She did have involvement of 2 out of 20 lymph nodes for a T1c N1 M0, stage IIB.  EGFR and ALK mutations were negative but PD-L1 was 90%.  Her doctor recommended chemotherapy with immunotherapy but she refused chemotherapy.  She was only willing to take the immunotherapy and we recommended 1 year.  She had her first dose on September 7 and her second on September 28.  She then relocated to [facility] and we have continued this regimen, and 1 year will be completed in September, 2024.     Tobacco abuse.  She has not smoked at all since April 12 when she had her surgery. Patient informed me that she started back smoking cigarettes but states that she is trying to quit again.    3.    Dissociative identity disorder with 3 personalities. She continues to follow with a psychiatrist.    4.    Hypokalemia.  Potassium was down to 3.0 last month and so she was on  supplement now 2 pills 3 times daily. Her level has increased to 4.5 today so she can decrease to 2 pills BID.    Plan:  She had a CT scan on 07/02/2022 that revealed a stable exam with numerous scattered bilateral tiny pulmonary nodules which are not substantially changed in the interval. There are no definite new suspicious nodules or mass, but she has mild circumferential wall thickening in the distal esophagus, stable in the interval. She has a right hilar node which has increased slightly from 6 mm to 9 mm and a left hilar node is stable at 8 mm. She has recently quit smoking again in the past 3 weeks. She informed me that her PCP has taken her off Celebrex, all BP medication, and placed her on meloxicam. She did have a recent fall as she tripped over something but is okay. Her day 1 cycle 16 of Keytruda is scheduled on 09/09/2022. Her WBC is 9.0, hemoglobin is 13.4, and platelet count is 249,000 as of today. Her CMP is pending. Her last CT scan was on 07/02/2022 and she will be due for repeat for August 27th. I will see her back in 3 weeks with CBC and CMP.  She understands and agrees with this plan of care. I have answered her questions and she knows to call with any concerns.    I provided 17 minutes of face-to-face time during this this encounter and > 50% was  spent counseling as documented under my assessment and plan.      Dellia Beckwith, MD [facility] AT [facility] [address] [place] [address] Dept: [phone] Dept Fax: [phone]      CHIEF COMPLAINT:  CC: Adenocarcinoma of the left lung   Current Treatment:  Postoperative adjuvant immunotherapy   HISTORY OF PRESENT ILLNESS:  Sydney Hunt is a 62 y.o. female with a history of tobacco abuse who has adenocarcinoma of the left lung. This was originally found on lung cancer screening CT from September 2022. PET imaging was obtained in October which revealed a solitary  1.25 cm left lower lobe pulmonary nodule to be hypermetabolic and consistent with small primary lung neoplasm. No findings for mediastinal or hilar adenopathy or metastatic disease elsewhere was observed. She met with the genetic counselor and [facility] diagnostic testing was negative for any clinically significant mutations. CT chest from December revealed an enlarging left lower lobe nodule, measuring 1.4 x 1.9 cm. The additional smaller pulmonary nodules remained unchanged. She was referred to Dr. 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 Immunohistochemical stains were positive for TTF-1, and negative for p63 and CK5/6. 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 Her FEV1 was 1.98, which is 88% of predicted and her FVC was 2.58, which is 89% of predicted. She was deemed a candidate for resection and met with Dr. Cliffton Asters for consultation. As she is not motivated to quit smoking, he felt that surgery would present an excessive risk for postoperative respiratory complications. He recommended SBRT as a good option if the patient was unable to quit smoking.  The patient went for a second opinion to Dr. Lucienne Minks and surgical resection was carried out in [place] on April 12 with a left lower lobe lobectomy.  She was hospitalized for 13 days and has not smoked since that time.  Pathology revealed a 2.5 cm adenocarcinoma, grade 2.  There was no evidence of pleural invasion and margins are clear but she tells me 2 of 20 nodes were positive, for a T1c N1 M0, stage IIB.  A port was placed on August 16 and she was started on pembrolizumab on September 7 and had her second dose on September 28. She refused chemotherapy. We recommend a full year of the pembrolizumab.   I have reviewed her chart and materials related to her cancer extensively and collaborated history with the patient. Summary  of oncologic history is as follows:    Oncology History  Non-small cell lung cancer, left (HCC)  01/15/2021 Initial Diagnosis    Non-small cell lung cancer, left (HCC)    02/12/2021 Cancer Staging    Staging form: Lung, AJCC 8th Edition - Clinical stage from 02/12/2021: Stage IA2 (cT1b, cN0, cM0) - Signed by Dellia Beckwith, MD on 02/14/2021 Histopathologic type: Adenocarcinoma, NOS Stage prefix: Initial diagnosis Histologic grade (G): GX Histologic grading system: 4 grade system Laterality: Left Tumor size (mm): 19 Lymph-vascular invasion (LVI): LVI not present (absent)/not identified Diagnostic confirmation: Positive histology Specimen type: Bronchial Biopsy Staged by: Managing physician Stage used in treatment planning: Yes National guidelines used in treatment planning: Yes Type of national guideline used in treatment planning: NCCN Staging comments: Rec surgical resection    05/11/2021 Cancer Staging    Staging form: Lung, AJCC 8th Edition - Pathologic stage from 05/11/2021: Stage IIB (ypT1c, pN1, cM0) - Signed by Gilman Buttner,  Gardiner Fanti, MD on 11/12/2021 Histopathologic type: Adenocarcinoma, NOS Stage prefix: Post-therapy Histologic grade (G): G2 Histologic grading system: 4 grade system Residual tumor (R): R0 - None Laterality: Left Tumor size (mm): 25 Lymph-vascular invasion (LVI): LVI not present (absent)/not identified Diagnostic confirmation: Positive histology PLUS positive immunophenotyping and/or positive genetic studies Specimen type: Excision Staged by: Managing physician Type of lung cancer: Surgically resected non-small cell lung cancer ECOG performance status: Grade 1 Perineural invasion (PNI): Absent Pleural/elastic layer invasion: PL0 Pleural lavage cytology: Unknown Weight loss: Absent Adequacy of mediastinal dissection: Adequate Radiotherapy dose: No Adjuvant radiation: No Adjuvant chemotherapy: Yes Stage used in treatment planning: Yes National  guidelines used in treatment planning: Yes Type of national guideline used in treatment planning: NCCN Staging comments: Adjuvant pembrolizumab, PDL 1 90%    10/04/2021 -  Chemotherapy    Patient is on Treatment Plan : LUNG NSCLC Pembrolizumab Adj q21d x 18 cycles      INTERVAL HISTORY:  Sydney Hunt is here today for a routine follow up for adenocarcinoma of the left lung, on adjuvant immunotherapy. Patient states that she feels well and complains of mild nausea, a cough with white sputum, and occasional dry heaving. She has recently quit smoking again in the past 3 weeks. She informed me that her PCP has taken her off Celebrex, all BP medication, and placed her on meloxicam. She did have a recent fall as she tripped over something but is okay. Her day 1 cycle 16 of Keytruda is scheduled on 09/09/2022. Her WBC is 9.0, hemoglobin is 13.4, and platelet count is 249,000 as of today. Her CMP is pending. Her last CT scan was on 07/02/2022 and she will be due for repeat on August 27th. I will see her back in 3 weeks with CBC and CMP.  She denies signs of infection such as sore throat, sinus drainage, cough, or urinary symptoms.  She denies fevers or recurrent chills. She denies pain. She denies nausea, vomiting, chest pain, dyspnea or cough. Her appetite is ok and her weight has decreased 2 pounds over last 3 weeks .  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 Report date is 12/12/2020.     The Multi-Cancer + RNA Panel offered by [facility] includes sequencing and/or deletion/duplication analysis of the following 84 genes:  AIP*, ALK,  APC*, ATM*, AXIN2*, BAP1*, BARD1*, BLM*, BMPR1A*, BRCA1*, BRCA2*, BRIP1*, CASR, CDC73*, CDH1*, CDK4, CDKN1B*, CDKN1C*, CDKN2A, CEBPA, CHEK2*, CTNNA1*, DICER1*, DIS3L2*, EGFR, EPCAM, FH   GERD (gastroesophageal reflux disease)    Heartburn 12/27/2021   Hormone disorder 11/20/2020   Hypertension    Hypokalemia 12/27/2021   Leukocytosis 01/17/2022   Migraines    Nodule of left lung 11/21/2020   Non-small cell lung cancer, left (HCC) 01/15/2021   Tobacco use 12/07/2020   Past Surgical History:  Procedure Laterality Date   ABDOMINAL HYSTERECTOMY  1989   unsure of vaginal or abdominal   BRONCHIAL BIOPSY  01/15/2021   Procedure: BRONCHIAL BIOPSIES;  Surgeon: Leslye Peer, MD;  Location: [facility] ENDOSCOPY;  Service: Pulmonary;;   BRONCHIAL BRUSHINGS  01/15/2021  Procedure: BRONCHIAL BRUSHINGS;  Surgeon: Leslye Peer, MD;  Location: [facility] ENDOSCOPY;  Service: Pulmonary;;   BRONCHIAL NEEDLE ASPIRATION BIOPSY  01/15/2021   Procedure: BRONCHIAL NEEDLE ASPIRATION BIOPSIES;  Surgeon: Leslye Peer, MD;  Location: [facility] ENDOSCOPY;  Service: Pulmonary;;   COLONOSCOPY  2022   FIDUCIAL MARKER PLACEMENT  01/15/2021   Procedure: FIDUCIAL MARKER PLACEMENT;  Surgeon: Leslye Peer, MD;  Location: [facility] ENDOSCOPY;  Service: Pulmonary;;   ovaries removed  1986   surgery to remove scar tissue     from colon/bladder in patient's late 30's   TUBAL LIGATION  1986   UPPER GI ENDOSCOPY     years ago in patient's late 32s   VIDEO BRONCHOSCOPY WITH RADIAL ENDOBRONCHIAL ULTRASOUND  01/15/2021   Procedure: VIDEO BRONCHOSCOPY WITH RADIAL ENDOBRONCHIAL ULTRASOUND;  Surgeon: Leslye Peer, MD;  Location: [facility] ENDOSCOPY;  Service: Pulmonary;;   Family History  Problem Relation Age of Onset   Pancreatic cancer Maternal Aunt 50   Brain cancer Maternal Uncle    Stomach cancer Maternal Uncle        dx. >50   Prostate cancer Half-Brother        passed away at 110   Prostate cancer Half-Brother    Social History:   reports that she quit smoking about 10 months ago. Her smoking use included cigarettes. She started smoking about 61 years ago. She has a 29.50 pack-year smoking history. She has never used smokeless tobacco. She reports current alcohol use of about 4.0 standard drinks of alcohol per week. She reports current drug use. Frequency: 3.00 times per week. Drug: Marijuana. She is widowed and lives at home.  She has quit smoking since April 12, when she had her surgical resection.  She has 2 biological children and 1 stepchild. She is disabled, and has been exposed to chemicals or other toxic agents. She was previously employed at a hosiery, chicken farm and cleaning houses. 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   Allergies  Allergen Reactions   Codeine Rash   Demerol [Meperidine Hcl] Other (See Comments)    coma   Meperidine Other (See Comments)    Several days will pass without patient being aware   Other Dermatitis    Stainless steel / skin weeps   Silver Dermatitis    Sterling silver skin weeps   Gold Bond [Menthol-Zinc Oxide] Dermatitis    Skin weeps   Neosporin [Bacitracin-Polymyxin B] Dermatitis    Skin weeps   Wound Dressing Adhesive Other (See Comments)    CAUSES SKIN TEARS PER PATIENT. INSTEAD USE THE TEGADERM 84M WITH ADHESIVE FREE WINDOW OVER PORT.    Current Medications:       Current Outpatient Medications  Medication Sig Dispense Refill   albuterol (VENTOLIN HFA) 108 (90 Base) MCG/ACT inhaler Inhale 2 puffs into the lungs every 4 (four) hours as needed for wheezing or shortness of breath.       amitriptyline (ELAVIL) 10 MG tablet Take 10 mg by mouth at bedtime as needed for sleep.       Ascorbic Acid (VITAMIN C WITH ROSE HIPS) 500 MG tablet Take 500 mg by mouth daily.       aspirin 81 MG EC tablet Take 81 mg by mouth daily.       atorvastatin (LIPITOR) 40 MG tablet Take 40 mg by mouth daily.       b complex vitamins capsule Take 1 capsule by mouth  daily. (Patient not taking:  Reported on 01/17/2022)       Biotin 5000 MCG CAPS Take 5,000 mcg by mouth daily.       Calcium-Vitamin D-Vitamin K (CHEWABLE CALCIUM PO) Take 650 mg by mouth daily.       celecoxib (CELEBREX) 200 MG capsule Take 200 mg by mouth daily.       fluconazole (DIFLUCAN) 100 MG tablet Take 1 tablet (100 mg total) by mouth daily. 10 tablet 0   gabapentin (NEURONTIN) 300 MG capsule Take 300 mg by mouth at bedtime.       hydrochlorothiazide (HYDRODIURIL) 25 MG tablet Take 25 mg by mouth every morning.       HYDROcodone-acetaminophen (NORCO/VICODIN) 5-325 MG tablet Take 1 tablet by mouth every 6 (six) hours as needed for moderate pain or severe pain. 30 tablet 0   levothyroxine (SYNTHROID) 50 MCG tablet Take 1 tablet (50 mcg total) by mouth daily before breakfast. 30 tablet 2   lidocaine-prilocaine (EMLA) cream Apply 1 Application topically as needed. 30 g 0   omeprazole (PRILOSEC) 40 MG capsule Take 1 capsule (40 mg total) by mouth daily. 30 capsule 2   ondansetron (ZOFRAN) 4 MG tablet Take 1 tablet (4 mg total) by mouth every 4 (four) hours as needed for nausea. 90 tablet 3   potassium chloride SA (KLOR-CON M) 20 MEQ tablet Take 20 mEq by mouth daily.       prochlorperazine (COMPAZINE) 10 MG tablet Take 1 tablet (10 mg total) by mouth every 6 (six) hours as needed for nausea or vomiting. 30 tablet 2   vitamin E 180 MG (400 UNITS) capsule Take 400 Units by mouth daily. (Patient not taking: Reported on 01/17/2022)        REVIEW OF SYSTEMS:  Review of Systems  Constitutional:  Positive for fatigue. Negative for appetite change, chills, diaphoresis, fever and unexpected weight change.  HENT:  Negative.  Negative for hearing loss, lump/mass, mouth sores, nosebleeds, sore throat, tinnitus, trouble swallowing and voice change.   Eyes: Negative.  Negative for eye problems and icterus.  Respiratory:  Positive for cough (white sputum, occasional dry heaving). Negative for chest  tightness, hemoptysis, shortness of breath and wheezing.   Cardiovascular: Negative.  Negative for chest pain, leg swelling and palpitations.  Gastrointestinal:  Positive for nausea (occasional could be due to missed medication.). Negative for abdominal distention, abdominal pain, blood in stool, constipation, diarrhea, rectal pain and vomiting.  Endocrine: Negative.   Genitourinary: Negative.  Negative for bladder incontinence, difficulty urinating, dyspareunia, dysuria, frequency, hematuria, menstrual problem, nocturia, pelvic pain, vaginal bleeding and vaginal discharge.   Musculoskeletal: Negative.  Negative for arthralgias, back pain, flank pain, gait problem, myalgias, neck pain and neck stiffness.  Skin: Negative.  Negative for itching, rash and wound.  Neurological:  Negative for dizziness, extremity weakness, gait problem, headaches, light-headedness, numbness, seizures and speech difficulty.  Hematological: Negative.  Negative for adenopathy. Does not bruise/bleed easily.  Psychiatric/Behavioral: Negative.  Negative for confusion, decreased concentration, depression, sleep disturbance and suicidal ideas. The patient is not nervous/anxious.        Dissociative identity disorder   VITALS:  Blood pressure 126/87, pulse 65, temperature 98 F (36.7 C), temperature source Oral, resp. rate 17, height 4\' 11"  (1.499 m), weight 132 lb 4.8 oz (60 kg), SpO2 99 %.     Wt Readings from Last 3 Encounters:  03/04/22 131 lb (59.4 kg)  02/28/22 132 lb 4.8 oz (60 kg)  02/07/22 137 lb 3.2 oz (62.2 kg)  Body mass index is 26.72 kg/m.   Performance status (ECOG): 1 - Symptomatic but completely ambulatory   PHYSICAL EXAM:  Physical Exam Vitals and nursing note reviewed.  Constitutional:      General: She is not in acute distress.    Appearance: Normal appearance. She is normal weight. She is not ill-appearing, toxic-appearing or diaphoretic.  HENT:     Head: Normocephalic and atraumatic.      Right Ear: Tympanic membrane, ear canal and external ear normal. There is no impacted cerumen.     Left Ear: Tympanic membrane, ear canal and external ear normal. There is no impacted cerumen.     Nose: Nose normal. No congestion or rhinorrhea.     Mouth/Throat:     Mouth: Mucous membranes are moist.     Pharynx: Oropharynx is clear. No oropharyngeal exudate or posterior oropharyngeal erythema.  Eyes:     General: No scleral icterus.       Right eye: No discharge.        Left eye: No discharge.     Extraocular Movements: Extraocular movements intact.     Conjunctiva/sclera: Conjunctivae normal.     Pupils: Pupils are equal, round, and reactive to light.  Neck:     Vascular: No carotid bruit.  Cardiovascular:     Rate and Rhythm: Normal rate and regular rhythm.     Pulses: Normal pulses.     Heart sounds: Normal heart sounds. No murmur heard.    No friction rub. No gallop.  Pulmonary:     Effort: Pulmonary effort is normal. No respiratory distress.     Breath sounds: Normal breath sounds. No stridor. No wheezing, rhonchi or rales.  Chest:     Chest wall: No tenderness.  Abdominal:     General: Bowel sounds are normal. There is no distension.     Palpations: Abdomen is soft. There is no hepatomegaly, splenomegaly or mass.     Tenderness: There is no abdominal tenderness. There is no right CVA tenderness, left CVA tenderness, guarding or rebound.     Hernia: No hernia is present.  Musculoskeletal:        General: No swelling, tenderness, deformity or signs of injury. Normal range of motion.     Cervical back: Normal range of motion and neck supple. No rigidity or tenderness.     Right lower leg: No edema.     Left lower leg: No edema.  Lymphadenopathy:     Cervical: No cervical adenopathy.     Right cervical: No superficial, deep or posterior cervical adenopathy.    Left cervical: No superficial, deep or posterior cervical adenopathy.     Upper Body:     Right upper body: No  supraclavicular, axillary or pectoral adenopathy.     Left upper body: No supraclavicular, axillary or pectoral adenopathy.  Skin:    General: Skin is warm and dry.     Coloration: Skin is not jaundiced or pale.     Findings: No bruising, erythema, lesion or rash.  Neurological:     General: No focal deficit present.     Mental Status: She is alert and oriented to person, place, and time. Mental status is at baseline.     Cranial Nerves: No cranial nerve deficit.     Sensory: No sensory deficit.     Motor: No weakness.     Coordination: Coordination normal.     Gait: Gait normal.     Deep Tendon Reflexes: Reflexes normal.  Psychiatric:        Mood and Affect: Mood normal.        Behavior: Behavior normal.        Thought Content: Thought content normal.        Judgment: Judgment normal.     LABS:    Component Ref Range & Units 2 d ago (07/02/22) 3 wk ago (06/11/22) 1 mo ago (05/23/22) 1 mo ago (05/16/22) 2 mo ago (05/02/22) 2 mo ago (04/11/22) 3 mo ago (03/19/22)  Hemoglobin 12.0 - 16.0 13.7 13.4 13.2 14.7 14.0 13.7 12.7  HCT 36 - 46 40 39 38 44 40 39 37  Neutrophils Absolute 5.51 4.93 4.55 4.70 5.54 6.90 7.13  Platelets 150 - 400 K/uL 283 286 308 305 326 322 318  WBC 10.2 8.8 9.1 8.6 9.9 11.5 12.5      Component Ref Range & Units 2 wk ago (07/25/22) 1 mo ago (07/02/22) 1 mo ago (06/11/22)  Sodium 135 - 145 mmol/L 140 138 R 140 R  Potassium 3.5 - 5.1 mmol/L 4.1 4.5 R 3.4 Abnormal  R  Chloride 98 - 111 mmol/L 105 103 R 101 R  CO2 22 - 32 mmol/L 26 26 Abnormal  R 29 Abnormal  R  Glucose, Bld 70 - 99 mg/dL 010 High  272 R 536 R  BUN 8 - 23 mg/dL 14 13 R 14 R  Creatinine 0.44 - 1.00 mg/dL 6.44 0.8 R 0.9 R  Calcium 8.9 - 10.3 mg/dL 9.3    Total Protein 6.5 - 8.1 g/dL 7.4    Albumin 3.5 - 5.0 g/dL 3.9    AST 15 - 41 U/L 18    ALT 0 - 44 U/L 11    Alkaline Phosphatase 38 - 126 U/L 72       Component Ref Range & Units 2 wk ago (07/25/22) 1 mo ago (06/11/22) 2  mo ago (05/23/22) 3 mo ago (05/02/22) 3 mo ago (04/11/22) 5 mo ago (02/28/22) 6 mo ago (02/07/22)  TSH 0.350 - 4.500 uIU/mL 0.474 2.618 CM 14.880 High  CM 9.734 High  CM 16.977 High  CM 47.710 High  CM 201.807 High  CM     Component Ref Range & Units 2 wk ago (07/25/22) 1 mo ago (06/11/22) 2 mo ago (05/23/22) 3 mo ago (05/02/22) 3 mo ago (04/11/22) 5 mo ago (02/28/22) 6 mo ago (02/07/22)  T4, Total 4.5 - 12.0 ug/dL 03.4 74.2 CM 7.1 CM 9.0 CM 9.0 CM 8.5 CM 1.3 Low  CM     STUDIES:  Exam: 07/02/2022 CT Chest with Contrast Impression: Stable exam with numerous scattered bilateral tiny pulmonary nodules are not substantially changed in the interval. No definite new suspicious nodules or mass. Continued close attention on follow-up recommended.   Mild circumferential wall thickening in the distal esophagus, and stable in the interval. Esophagitis would be a consideration. Aortic Atherosclerosis (ICD10-170.0) and Emphysema (ICD10-J43.9)   EXAM: 03/19/2022 CT CHEST WITH CONTRAST IMPRESSION Numerous small scattered bilateral pulmonary nodules are all stable, including persistent stable lingular nodules that were new on her 11/23/2021 CT scan. The persistence of these nodules may indicate pulmonary metastases.  No new or progressive metastatic disease in the chest Trace basilar left pleural effusion decreased.  Aortic Atherosclerosis (ICD10-170.0) and Emphysema. ([id number])     I,Jasmine M Lassiter,acting as a scribe for Dellia Beckwith, MD.,have documented all relevant documentation on the behalf of Dellia Beckwith, MD,as directed by  Dellia Beckwith, MD while in the  presence of Dellia Beckwith, MD.

## 2022-08-15 ENCOUNTER — Other Ambulatory Visit: Payer: Self-pay | Admitting: Oncology

## 2022-08-15 ENCOUNTER — Inpatient Hospital Stay: Payer: Medicare HMO

## 2022-08-15 ENCOUNTER — Inpatient Hospital Stay (INDEPENDENT_AMBULATORY_CARE_PROVIDER_SITE_OTHER): Payer: Medicare HMO | Admitting: Oncology

## 2022-08-15 ENCOUNTER — Encounter: Payer: Self-pay | Admitting: Oncology

## 2022-08-15 VITALS — BP 94/59 | HR 67 | Temp 98.1°F | Resp 16 | Ht 59.0 in | Wt 111.8 lb

## 2022-08-15 DIAGNOSIS — C3492 Malignant neoplasm of unspecified part of left bronchus or lung: Secondary | ICD-10-CM

## 2022-08-15 DIAGNOSIS — E032 Hypothyroidism due to medicaments and other exogenous substances: Secondary | ICD-10-CM | POA: Diagnosis not present

## 2022-08-15 DIAGNOSIS — Z5112 Encounter for antineoplastic immunotherapy: Secondary | ICD-10-CM | POA: Diagnosis not present

## 2022-08-15 LAB — CBC AND DIFFERENTIAL
HCT: 39 (ref 36–46)
Hemoglobin: 13.4 (ref 12.0–16.0)
Neutrophils Absolute: 4.41
Platelets: 249 10*3/uL (ref 150–400)
WBC: 9

## 2022-08-15 LAB — HEPATIC FUNCTION PANEL
ALT: 14 U/L (ref 7–35)
AST: 25 (ref 13–35)
Alkaline Phosphatase: 60 (ref 25–125)
Bilirubin, Total: 1.6

## 2022-08-15 LAB — BASIC METABOLIC PANEL
BUN: 13 (ref 4–21)
CO2: 28 — AB (ref 13–22)
Chloride: 107 (ref 99–108)
Creatinine: 0.8 (ref 0.5–1.1)
Glucose: 111
Potassium: 4.4 mEq/L (ref 3.5–5.1)
Sodium: 140 (ref 137–147)

## 2022-08-15 LAB — COMPREHENSIVE METABOLIC PANEL
Albumin: 4.1 (ref 3.5–5.0)
Calcium: 9.7 (ref 8.7–10.7)

## 2022-08-15 LAB — CBC: RBC: 4.1 (ref 3.87–5.11)

## 2022-08-15 LAB — TSH: TSH: 0.112 u[IU]/mL — ABNORMAL LOW (ref 0.350–4.500)

## 2022-08-16 ENCOUNTER — Encounter: Payer: Self-pay | Admitting: Oncology

## 2022-08-16 ENCOUNTER — Telehealth: Payer: Self-pay

## 2022-08-16 DIAGNOSIS — E032 Hypothyroidism due to medicaments and other exogenous substances: Secondary | ICD-10-CM

## 2022-08-16 LAB — T4: T4, Total: 10.1 ug/dL (ref 4.5–12.0)

## 2022-08-16 MED ORDER — LEVOTHYROXINE SODIUM 50 MCG PO TABS
50.0000 ug | ORAL_TABLET | Freq: Every day | ORAL | Status: DC
Start: 2022-08-16 — End: 2022-10-07

## 2022-08-16 MED FILL — Pembrolizumab IV Soln 100 MG/4ML (25 MG/ML): INTRAVENOUS | Qty: 8 | Status: AC

## 2022-08-16 NOTE — Telephone Encounter (Signed)
-----   Message from Dellia Beckwith sent at 08/16/2022  6:58 AM EDT ----- Regarding: call Tell her labs all look good including K, but thyroid too high now so I rec we go back down to 50 mcg daily.  If she does not have any left, let me know and I will send in new Rx

## 2022-08-16 NOTE — Telephone Encounter (Signed)
Patient advised of labs results. She stated that she had plenty of Levothyroxine 50 mcg and will restart in the morning. Medication has been update in chart.

## 2022-08-19 ENCOUNTER — Inpatient Hospital Stay: Payer: Medicare HMO

## 2022-08-19 ENCOUNTER — Other Ambulatory Visit: Payer: Self-pay

## 2022-08-19 VITALS — BP 129/76 | HR 64 | Temp 98.1°F | Resp 16 | Ht 59.0 in | Wt 110.0 lb

## 2022-08-19 DIAGNOSIS — C3492 Malignant neoplasm of unspecified part of left bronchus or lung: Secondary | ICD-10-CM

## 2022-08-19 DIAGNOSIS — Z5112 Encounter for antineoplastic immunotherapy: Secondary | ICD-10-CM | POA: Diagnosis not present

## 2022-08-19 MED ORDER — SODIUM CHLORIDE 0.9 % IV SOLN: Freq: Once | INTRAVENOUS | Status: AC

## 2022-08-19 MED ORDER — HEPARIN SOD (PORK) LOCK FLUSH 100 UNIT/ML IV SOLN
500.0000 [IU] | Freq: Once | INTRAVENOUS | Status: AC | PRN
  Administered 2022-08-19: 500 [IU]

## 2022-08-19 MED ORDER — SODIUM CHLORIDE 0.9% FLUSH
10.0000 mL | INTRAVENOUS | Status: DC | PRN
  Administered 2022-08-19: 10 mL

## 2022-08-19 MED ORDER — SODIUM CHLORIDE 0.9 % IV SOLN
200.0000 mg | Freq: Once | INTRAVENOUS | Status: AC
  Administered 2022-08-19: 200 mg via INTRAVENOUS
  Filled 2022-08-19: qty 8

## 2022-08-19 NOTE — Patient Instructions (Signed)

## 2022-08-30 ENCOUNTER — Encounter: Payer: Self-pay | Admitting: Oncology

## 2022-08-30 NOTE — Progress Notes (Deleted)
[facility]  [address] 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 The solitary left lower lobe nodule enlarged from 1.2 cm in September to 1.4 x 1.9 cm by December of 2022. She met with Dr. Cliffton Asters for consultation, and he recommended SBRT as he felt that her smoking would cause excessive risk for postoperative complications.  She sought another opinion with  Dr. Maryan Rued in [place], who agreed with surgical management. Her daughter lives in [place].  She had her surgery on April 12 with left thoracotomy and left lower lobe lobectomy along with mediastinal lymph node dissection.  Pathology revealed an adenocarcinoma grade 2 measuring 2.5 cm in diameter but with no evidence of visceral pleural invasion or direct invasion of adjacent structures.  All margins are negative for invasive carcinoma.  She did have involvement of 2 out of 20 lymph nodes for a T1c N1 M0, stage IIB.  EGFR and ALK mutations were negative but PD-L1 was 90%.  Her doctor recommended chemotherapy with immunotherapy but she refused chemotherapy.  She was only willing to take the immunotherapy and we recommended 1 year.  She had her first dose on September 7 and her second on September 28.  She then relocated to [facility] and we have continued this regimen, and 1 year will be completed in September, 2024.     Tobacco abuse.  She has not smoked at all since April 12 when she had her surgery. Patient informed me that she started back smoking cigarettes but states that she is trying to quit again.    3.    Dissociative identity disorder with 3 personalities. She continues to follow with a psychiatrist.    4.    Hypokalemia.  Potassium was down to 3.0 last month and so she was on  supplement now 2 pills 3 times daily. Her level has increased to 4.5 today so she can decrease to 2 pills BID.    Plan:  She had a CT scan on 07/02/2022 that revealed a stable exam with numerous scattered bilateral tiny pulmonary nodules which are not substantially changed in the interval. There are no definite new suspicious nodules or mass, but she has mild circumferential wall thickening in the distal esophagus, stable in the interval. She has a right hilar node which has increased slightly from 6 mm to 9 mm and a left hilar node is stable at 8 mm. She has recently quit smoking again in the past 3 weeks. She informed me that her PCP has taken her off Celebrex, all BP medication, and placed her on meloxicam. She did have a recent fall as she tripped over something but is okay. Her day 1 cycle 16 of Keytruda is scheduled on 09/09/2022. Her WBC is 9.0, hemoglobin is 13.4, and platelet count is 249,000 as of today. Her CMP is pending. Her last CT scan was on 07/02/2022 and she will be due for repeat for August 27th. I will see her back in 3 weeks with CBC and CMP.  She understands and agrees with this plan of care. I have answered her questions and she knows to call with any concerns.    I provided 17 minutes of face-to-face time during this this encounter and > 50% was  spent counseling as documented under my assessment and plan.      Dellia Beckwith, MD [facility] AT [facility] [address] [place] [address] Dept: [phone] Dept Fax: [phone]      CHIEF COMPLAINT:  CC: Adenocarcinoma of the left lung   Current Treatment:  Postoperative adjuvant immunotherapy   HISTORY OF PRESENT ILLNESS:  Sydney Hunt is a 62 y.o. female with a history of tobacco abuse who has adenocarcinoma of the left lung. This was originally found on lung cancer screening CT from September 2022. PET imaging was obtained in October which revealed a solitary  1.25 cm left lower lobe pulmonary nodule to be hypermetabolic and consistent with small primary lung neoplasm. No findings for mediastinal or hilar adenopathy or metastatic disease elsewhere was observed. She met with the genetic counselor and [facility] diagnostic testing was negative for any clinically significant mutations. CT chest from December revealed an enlarging left lower lobe nodule, measuring 1.4 x 1.9 cm. The additional smaller pulmonary nodules remained unchanged. She was referred to Dr. 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 Immunohistochemical stains were positive for TTF-1, and negative for p63 and CK5/6. 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 Her FEV1 was 1.98, which is 88% of predicted and her FVC was 2.58, which is 89% of predicted. She was deemed a candidate for resection and met with Dr. Cliffton Asters for consultation. As she is not motivated to quit smoking, he felt that surgery would present an excessive risk for postoperative respiratory complications. He recommended SBRT as a good option if the patient was unable to quit smoking.  The patient went for a second opinion to Dr. Lucienne Minks and surgical resection was carried out in [place] on April 12 with a left lower lobe lobectomy.  She was hospitalized for 13 days and has not smoked since that time.  Pathology revealed a 2.5 cm adenocarcinoma, grade 2.  There was no evidence of pleural invasion and margins are clear but she tells me 2 of 20 nodes were positive, for a T1c N1 M0, stage IIB.  A port was placed on August 16 and she was started on pembrolizumab on September 7 and had her second dose on September 28. She refused chemotherapy. We recommend a full year of the pembrolizumab.   I have reviewed her chart and materials related to her cancer extensively and collaborated history with the patient. Summary  of oncologic history is as follows:    Oncology History  Non-small cell lung cancer, left (HCC)  01/15/2021 Initial Diagnosis    Non-small cell lung cancer, left (HCC)    02/12/2021 Cancer Staging    Staging form: Lung, AJCC 8th Edition - Clinical stage from 02/12/2021: Stage IA2 (cT1b, cN0, cM0) - Signed by Dellia Beckwith, MD on 02/14/2021 Histopathologic type: Adenocarcinoma, NOS Stage prefix: Initial diagnosis Histologic grade (G): GX Histologic grading system: 4 grade system Laterality: Left Tumor size (mm): 19 Lymph-vascular invasion (LVI): LVI not present (absent)/not identified Diagnostic confirmation: Positive histology Specimen type: Bronchial Biopsy Staged by: Managing physician Stage used in treatment planning: Yes National guidelines used in treatment planning: Yes Type of national guideline used in treatment planning: NCCN Staging comments: Rec surgical resection    05/11/2021 Cancer Staging    Staging form: Lung, AJCC 8th Edition - Pathologic stage from 05/11/2021: Stage IIB (ypT1c, pN1, cM0) - Signed by Gilman Buttner,  Gardiner Fanti, MD on 11/12/2021 Histopathologic type: Adenocarcinoma, NOS Stage prefix: Post-therapy Histologic grade (G): G2 Histologic grading system: 4 grade system Residual tumor (R): R0 - None Laterality: Left Tumor size (mm): 25 Lymph-vascular invasion (LVI): LVI not present (absent)/not identified Diagnostic confirmation: Positive histology PLUS positive immunophenotyping and/or positive genetic studies Specimen type: Excision Staged by: Managing physician Type of lung cancer: Surgically resected non-small cell lung cancer ECOG performance status: Grade 1 Perineural invasion (PNI): Absent Pleural/elastic layer invasion: PL0 Pleural lavage cytology: Unknown Weight loss: Absent Adequacy of mediastinal dissection: Adequate Radiotherapy dose: No Adjuvant radiation: No Adjuvant chemotherapy: Yes Stage used in treatment planning: Yes National  guidelines used in treatment planning: Yes Type of national guideline used in treatment planning: NCCN Staging comments: Adjuvant pembrolizumab, PDL 1 90%    10/04/2021 -  Chemotherapy    Patient is on Treatment Plan : LUNG NSCLC Pembrolizumab Adj q21d x 18 cycles      INTERVAL HISTORY:  Guynell is here today for a routine follow up for adenocarcinoma of the left lung, on adjuvant immunotherapy. Patient states that she feels fair and has a cough..      She denies signs of infection such as sore throat, sinus drainage, cough, or urinary symptoms.  She denies fevers or recurrent chills. She denies pain. She denies nausea, vomiting, chest pain, dyspnea or cough. Her appetite is good and her weight has been stable.  Patient states that she feels well and complains of mild nausea, a cough with white sputum, and occasional dry heaving. She has recently quit smoking again in the past 3 weeks. She informed me that her PCP has taken her off Celebrex, all BP medication, and placed her on meloxicam. She did have a recent fall as she tripped over something but is okay. Her day 1 cycle 16 of Keytruda is scheduled on 09/09/2022. Her WBC is 9.0, hemoglobin is 13.4, and platelet count is 249,000 as of today. Her CMP is pending. Her last CT scan was on 07/02/2022 and she will be due for repeat on August 27th. I will see her back in 3 weeks with CBC and CMP.  She denies signs of infection such as sore throat, sinus drainage, cough, or urinary symptoms.  She denies fevers or recurrent chills. She denies pain. She denies nausea, vomiting, chest pain, dyspnea or cough. Her appetite is ok and her weight has decreased 2 pounds over last 3 weeks .  HISTORY:   Past Medical History:  Diagnosis Date   Allergy    Arthritis    Complication of anesthesia    slow to wake up   COPD (chronic obstructive pulmonary disease) (HCC)    COPD, mild (HCC) 12/15/2020   Family history of pancreatic cancer 11/28/2020   Family history  of prostate cancer 11/28/2020   Family history of stomach cancer 11/28/2020   Genetic testing 12/18/2020   [facility] Multi-Cancer Panel was Negative. Report date is 12/12/2020.     The Multi-Cancer + RNA Panel offered by [facility] includes sequencing and/or deletion/duplication analysis of the following 84 genes:  AIP*, ALK, APC*, ATM*, AXIN2*, BAP1*, BARD1*, BLM*, BMPR1A*, BRCA1*, BRCA2*, BRIP1*, CASR, CDC73*, CDH1*, CDK4, CDKN1B*, CDKN1C*, CDKN2A, CEBPA, CHEK2*, CTNNA1*, DICER1*, DIS3L2*, EGFR, EPCAM, FH   GERD (gastroesophageal reflux disease)    Heartburn 12/27/2021   Hormone disorder 11/20/2020   Hypertension    Hypokalemia 12/27/2021   Leukocytosis 01/17/2022   Migraines    Nodule of left lung 11/21/2020   Non-small cell lung cancer, left (  HCC) 01/15/2021   Tobacco use 12/07/2020   Past Surgical History:  Procedure Laterality Date   ABDOMINAL HYSTERECTOMY  1989   unsure of vaginal or abdominal   BRONCHIAL BIOPSY  01/15/2021   Procedure: BRONCHIAL BIOPSIES;  Surgeon: Leslye Peer, MD;  Location: [facility] ENDOSCOPY;  Service: Pulmonary;;   BRONCHIAL BRUSHINGS  01/15/2021   Procedure: BRONCHIAL BRUSHINGS;  Surgeon: Leslye Peer, MD;  Location: [facility] ENDOSCOPY;  Service: Pulmonary;;   BRONCHIAL NEEDLE ASPIRATION BIOPSY  01/15/2021   Procedure: BRONCHIAL NEEDLE ASPIRATION BIOPSIES;  Surgeon: Leslye Peer, MD;  Location: [facility] ENDOSCOPY;  Service: Pulmonary;;   COLONOSCOPY  2022   FIDUCIAL MARKER PLACEMENT  01/15/2021   Procedure: FIDUCIAL MARKER PLACEMENT;  Surgeon: Leslye Peer, MD;  Location: [facility] ENDOSCOPY;  Service: Pulmonary;;   ovaries removed  1986   surgery to remove scar tissue     from colon/bladder in patient's late 30's   TUBAL LIGATION  1986   UPPER GI ENDOSCOPY     years ago in patient's late 67s   VIDEO BRONCHOSCOPY WITH RADIAL ENDOBRONCHIAL ULTRASOUND  01/15/2021   Procedure: VIDEO BRONCHOSCOPY WITH RADIAL ENDOBRONCHIAL ULTRASOUND;  Surgeon: Leslye Peer, MD;   Location: [facility] ENDOSCOPY;  Service: Pulmonary;;   Family History  Problem Relation Age of Onset   Pancreatic cancer Maternal Aunt 50   Brain cancer Maternal Uncle    Stomach cancer Maternal Uncle        dx. >50   Prostate cancer Half-Brother        passed away at 15   Prostate cancer Half-Brother    Social History:  reports that she quit smoking about 10 months ago. Her smoking use included cigarettes. She started smoking about 61 years ago. She has a 29.50 pack-year smoking history. She has never used smokeless tobacco. She reports current alcohol use of about 4.0 standard drinks of alcohol per week. She reports current drug use. Frequency: 3.00 times per week. Drug: Marijuana. She is widowed and lives at home.  She has quit smoking since April 12, when she had her surgical resection.  She has 2 biological children and 1 stepchild. She is disabled, and has been exposed to chemicals or other toxic agents. She was previously employed at a hosiery, chicken farm and cleaning houses. 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   Allergies  Allergen Reactions   Codeine Rash   Demerol [Meperidine Hcl] Other (See Comments)    coma   Meperidine Other (See Comments)    Several days will pass without patient being aware   Other Dermatitis    Stainless steel / skin weeps   Silver Dermatitis    Sterling silver skin weeps   Gold Bond [Menthol-Zinc Oxide] Dermatitis    Skin weeps   Neosporin [Bacitracin-Polymyxin B] Dermatitis    Skin weeps   Wound Dressing Adhesive Other (See Comments)    CAUSES SKIN TEARS PER PATIENT. INSTEAD USE THE TEGADERM 48M WITH ADHESIVE FREE WINDOW OVER PORT.    Current Medications:       Current Outpatient Medications  Medication Sig Dispense Refill   albuterol (VENTOLIN HFA) 108 (90 Base) MCG/ACT inhaler Inhale 2 puffs into the lungs every 4 (four) hours as needed for wheezing or shortness of breath.       amitriptyline (ELAVIL) 10 MG tablet  Take 10 mg by mouth at bedtime as needed for sleep.       Ascorbic Acid (VITAMIN C WITH ROSE HIPS) 500  MG tablet Take 500 mg by mouth daily.       aspirin 81 MG EC tablet Take 81 mg by mouth daily.       atorvastatin (LIPITOR) 40 MG tablet Take 40 mg by mouth daily.       b complex vitamins capsule Take 1 capsule by mouth daily. (Patient not taking: Reported on 01/17/2022)       Biotin 5000 MCG CAPS Take 5,000 mcg by mouth daily.       Calcium-Vitamin D-Vitamin K (CHEWABLE CALCIUM PO) Take 650 mg by mouth daily.       celecoxib (CELEBREX) 200 MG capsule Take 200 mg by mouth daily.       fluconazole (DIFLUCAN) 100 MG tablet Take 1 tablet (100 mg total) by mouth daily. 10 tablet 0   gabapentin (NEURONTIN) 300 MG capsule Take 300 mg by mouth at bedtime.       hydrochlorothiazide (HYDRODIURIL) 25 MG tablet Take 25 mg by mouth every morning.       HYDROcodone-acetaminophen (NORCO/VICODIN) 5-325 MG tablet Take 1 tablet by mouth every 6 (six) hours as needed for moderate pain or severe pain. 30 tablet 0   levothyroxine (SYNTHROID) 50 MCG tablet Take 1 tablet (50 mcg total) by mouth daily before breakfast. 30 tablet 2   lidocaine-prilocaine (EMLA) cream Apply 1 Application topically as needed. 30 g 0   omeprazole (PRILOSEC) 40 MG capsule Take 1 capsule (40 mg total) by mouth daily. 30 capsule 2   ondansetron (ZOFRAN) 4 MG tablet Take 1 tablet (4 mg total) by mouth every 4 (four) hours as needed for nausea. 90 tablet 3   potassium chloride SA (KLOR-CON M) 20 MEQ tablet Take 20 mEq by mouth daily.       prochlorperazine (COMPAZINE) 10 MG tablet Take 1 tablet (10 mg total) by mouth every 6 (six) hours as needed for nausea or vomiting. 30 tablet 2   vitamin E 180 MG (400 UNITS) capsule Take 400 Units by mouth daily. (Patient not taking: Reported on 01/17/2022)        REVIEW OF SYSTEMS:  Review of Systems  Constitutional:  Positive for fatigue. Negative for appetite change, chills, diaphoresis, fever and  unexpected weight change.  HENT:  Negative.  Negative for hearing loss, lump/mass, mouth sores, nosebleeds, sore throat, tinnitus, trouble swallowing and voice change.   Eyes: Negative.  Negative for eye problems and icterus.  Respiratory:  Positive for cough (white sputum, occasional dry heaving). Negative for chest tightness, hemoptysis, shortness of breath and wheezing.   Cardiovascular: Negative.  Negative for chest pain, leg swelling and palpitations.  Gastrointestinal:  Positive for nausea (occasional could be due to missed medication.). Negative for abdominal distention, abdominal pain, blood in stool, constipation, diarrhea, rectal pain and vomiting.  Endocrine: Negative.   Genitourinary: Negative.  Negative for bladder incontinence, difficulty urinating, dyspareunia, dysuria, frequency, hematuria, menstrual problem, nocturia, pelvic pain, vaginal bleeding and vaginal discharge.   Musculoskeletal: Negative.  Negative for arthralgias, back pain, flank pain, gait problem, myalgias, neck pain and neck stiffness.  Skin: Negative.  Negative for itching, rash and wound.  Neurological:  Negative for dizziness, extremity weakness, gait problem, headaches, light-headedness, numbness, seizures and speech difficulty.  Hematological: Negative.  Negative for adenopathy. Does not bruise/bleed easily.  Psychiatric/Behavioral: Negative.  Negative for confusion, decreased concentration, depression, sleep disturbance and suicidal ideas. The patient is not nervous/anxious.        Dissociative identity disorder   VITALS:  Blood pressure 126/87, pulse  65, temperature 98 F (36.7 C), temperature source Oral, resp. rate 17, height 4\' 11"  (1.499 m), weight 132 lb 4.8 oz (60 kg), SpO2 99 %.     Wt Readings from Last 3 Encounters:  03/04/22 131 lb (59.4 kg)  02/28/22 132 lb 4.8 oz (60 kg)  02/07/22 137 lb 3.2 oz (62.2 kg)    Body mass index is 26.72 kg/m.   Performance status (ECOG): 1 - Symptomatic but  completely ambulatory   PHYSICAL EXAM:  Physical Exam Vitals and nursing note reviewed.  Constitutional:      General: She is not in acute distress.    Appearance: Normal appearance. She is normal weight. She is not ill-appearing, toxic-appearing or diaphoretic.  HENT:     Head: Normocephalic and atraumatic.     Right Ear: Tympanic membrane, ear canal and external ear normal. There is no impacted cerumen.     Left Ear: Tympanic membrane, ear canal and external ear normal. There is no impacted cerumen.     Nose: Nose normal. No congestion or rhinorrhea.     Mouth/Throat:     Mouth: Mucous membranes are moist.     Pharynx: Oropharynx is clear. No oropharyngeal exudate or posterior oropharyngeal erythema.  Eyes:     General: No scleral icterus.       Right eye: No discharge.        Left eye: No discharge.     Extraocular Movements: Extraocular movements intact.     Conjunctiva/sclera: Conjunctivae normal.     Pupils: Pupils are equal, round, and reactive to light.  Neck:     Vascular: No carotid bruit.  Cardiovascular:     Rate and Rhythm: Normal rate and regular rhythm.     Pulses: Normal pulses.     Heart sounds: Normal heart sounds. No murmur heard.    No friction rub. No gallop.  Pulmonary:     Effort: Pulmonary effort is normal. No respiratory distress.     Breath sounds: Normal breath sounds. No stridor. No wheezing, rhonchi or rales.  Chest:     Chest wall: No tenderness.  Abdominal:     General: Bowel sounds are normal. There is no distension.     Palpations: Abdomen is soft. There is no hepatomegaly, splenomegaly or mass.     Tenderness: There is no abdominal tenderness. There is no right CVA tenderness, left CVA tenderness, guarding or rebound.     Hernia: No hernia is present.  Musculoskeletal:        General: No swelling, tenderness, deformity or signs of injury. Normal range of motion.     Cervical back: Normal range of motion and neck supple. No rigidity or  tenderness.     Right lower leg: No edema.     Left lower leg: No edema.  Lymphadenopathy:     Cervical: No cervical adenopathy.     Right cervical: No superficial, deep or posterior cervical adenopathy.    Left cervical: No superficial, deep or posterior cervical adenopathy.     Upper Body:     Right upper body: No supraclavicular, axillary or pectoral adenopathy.     Left upper body: No supraclavicular, axillary or pectoral adenopathy.  Skin:    General: Skin is warm and dry.     Coloration: Skin is not jaundiced or pale.     Findings: No bruising, erythema, lesion or rash.  Neurological:     General: No focal deficit present.     Mental Status: She is  alert and oriented to person, place, and time. Mental status is at baseline.     Cranial Nerves: No cranial nerve deficit.     Sensory: No sensory deficit.     Motor: No weakness.     Coordination: Coordination normal.     Gait: Gait normal.     Deep Tendon Reflexes: Reflexes normal.  Psychiatric:        Mood and Affect: Mood normal.        Behavior: Behavior normal.        Thought Content: Thought content normal.        Judgment: Judgment normal.     LABS:    Component Ref Range & Units 2 d ago (07/02/22) 3 wk ago (06/11/22) 1 mo ago (05/23/22) 1 mo ago (05/16/22) 2 mo ago (05/02/22) 2 mo ago (04/11/22) 3 mo ago (03/19/22)  Hemoglobin 12.0 - 16.0 13.7 13.4 13.2 14.7 14.0 13.7 12.7  HCT 36 - 46 40 39 38 44 40 39 37  Neutrophils Absolute 5.51 4.93 4.55 4.70 5.54 6.90 7.13  Platelets 150 - 400 K/uL 283 286 308 305 326 322 318  WBC 10.2 8.8 9.1 8.6 9.9 11.5 12.5      Component Ref Range & Units 2 wk ago (07/25/22) 1 mo ago (07/02/22) 1 mo ago (06/11/22)  Sodium 135 - 145 mmol/L 140 138 R 140 R  Potassium 3.5 - 5.1 mmol/L 4.1 4.5 R 3.4 Abnormal  R  Chloride 98 - 111 mmol/L 105 103 R 101 R  CO2 22 - 32 mmol/L 26 26 Abnormal  R 29 Abnormal  R  Glucose, Bld 70 - 99 mg/dL 161 High  096 R 045 R  BUN 8 - 23 mg/dL 14 13 R  14 R  Creatinine 0.44 - 1.00 mg/dL 4.09 0.8 R 0.9 R  Calcium 8.9 - 10.3 mg/dL 9.3    Total Protein 6.5 - 8.1 g/dL 7.4    Albumin 3.5 - 5.0 g/dL 3.9    AST 15 - 41 U/L 18    ALT 0 - 44 U/L 11    Alkaline Phosphatase 38 - 126 U/L 72       omponent Ref Range & Units 2 wk ago (08/15/22) 1 mo ago (07/25/22) 2 mo ago (06/11/22) 3 mo ago (05/23/22) 4 mo ago (05/02/22) 4 mo ago (04/11/22) 6 mo ago (02/28/22)  TSH 0.350 - 4.500 uIU/mL 0.112 Low  0.474 CM 2.618 CM 14.880 High  CM 9.734 High  CM 16.977 High  CM 47.710 High  CM   Component Ref Range & Units 2 wk ago (08/15/22) 1 mo ago (07/25/22) 2 mo ago (06/11/22) 3 mo ago (05/23/22) 4 mo ago (05/02/22) 4 mo ago (04/11/22) 6 mo ago (02/28/22)  T4, Total 4.5 - 12.0 ug/dL 81.1 91.4 CM 78.2 CM 7.1 CM 9.0 CM 9.0 CM 8.5 CM     STUDIES:  Exam: 07/02/2022 CT Chest with Contrast Impression: Stable exam with numerous scattered bilateral tiny pulmonary nodules are not substantially changed in the interval. No definite new suspicious nodules or mass. Continued close attention on follow-up recommended.   Mild circumferential wall thickening in the distal esophagus, and stable in the interval. Esophagitis would be a consideration. Aortic Atherosclerosis (ICD10-170.0) and Emphysema (ICD10-J43.9)   EXAM: 03/19/2022 CT CHEST WITH CONTRAST IMPRESSION Numerous small scattered bilateral pulmonary nodules are all stable, including persistent stable lingular nodules that were new on her 11/23/2021 CT scan. The persistence of these nodules may indicate pulmonary metastases.  No  new or progressive metastatic disease in the chest Trace basilar left pleural effusion decreased.  Aortic Atherosclerosis (ICD10-170.0) and Emphysema. (NWG95-A21.9)     I,Jasmine M Lassiter,acting as a scribe for Dellia Beckwith, MD.,have documented all relevant documentation on the behalf of Dellia Beckwith, MD,as directed by  Dellia Beckwith, MD while in the presence  of Dellia Beckwith, MD.

## 2022-09-04 NOTE — Progress Notes (Signed)
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 The solitary left lower lobe nodule enlarged from 1.2 cm in September to 1.4 x 1.9 cm by December of 2022. She met with [place] [facility] for consultation, and he recommended SBRT as he felt that her smoking would cause excessive risk for postoperative complications.  She sought another opinion with  [place] in [place], who agreed with surgical management. Her daughter lives in [place].  She had her surgery on April 12 with left thoracotomy and left lower lobe lobectomy along with mediastinal lymph node dissection.  Pathology revealed an adenocarcinoma grade 2 measuring 2.5 cm in diameter but with no evidence of visceral pleural invasion or direct invasion of adjacent structures.  All margins are negative for invasive carcinoma.  She did have involvement of 2 out of 20 lymph nodes for a T1c N1 M0, stage IIB.  EGFR and ALK mutations were negative but PD-L1 was 90%.  Her doctor recommended chemotherapy with immunotherapy but she refused chemotherapy.  She was only willing to take the immunotherapy and we recommended 1 year.  She had her first dose on September 7 and her second on September 28.  She then relocated to [place] and we have continued this regimen, and 1 year will be completed in September, 2024.     Tobacco abuse.  She has not smoked at all since April 12 when she had her surgery. Patient informed me that she started back smoking cigarettes but states that she is trying to quit again.    3.    Dissociative identity disorder with 3 personalities. She continues to follow with a psychiatrist.    4.    Hypokalemia.  Potassium was down to 3.0 last month and so she was on  supplement now 2 pills 3 times daily. Her level has increased to 4.5 today so she can decrease to 2 pills BID.    Plan:   I advised her to increase her fluid consumption to thin out the mucous. Her day 1 cycle 16 of Keytruda is scheduled for 09/09/2022. Her hemoglobin is 13.4, WBC is 11 with 7.3% eosinophils, and platelet count is 251,000 today. Her CMP is pending.  She will have a CT chest before her next appointment.  I will see her  back in 3 weeks with CBC, CMP, CEA and CT chest. She will then have her last treatment and we will go to 3 month follow up after that. She understands and agrees with this plan of care. I have answered her questions and she knows to call with any concerns.    I provided 15 minutes of face-to-face time during this this encounter and > 50% was spent counseling as documented under my assessment and plan.      Sydney Beckwith, MD [facility] [address] [place] [address] Dept: [phone] Dept Fax: [phone]      CHIEF COMPLAINT:  CC: Adenocarcinoma of the left lung   Current Treatment:  Postoperative adjuvant immunotherapy   HISTORY OF PRESENT ILLNESS:  Sydney Hunt is a 62 y.o. female with a history of tobacco abuse who has adenocarcinoma of the left lung. This  was originally found on lung cancer screening CT from September 2022. PET imaging was obtained in October which revealed a solitary 1.25 cm left lower lobe pulmonary nodule to be hypermetabolic and consistent with small primary lung neoplasm. No findings for mediastinal or hilar adenopathy or metastatic disease elsewhere was observed. She met with the genetic counselor and [facility] diagnostic testing was negative for any clinically significant mutations. CT chest from December revealed an enlarging left lower lobe nodule, measuring 1.4 x 1.9 cm. The additional smaller pulmonary nodules remained unchanged. She was referred to Dr.  Levy Hunt and underwent bronchoscopy on December 19th and cytology from this procedure confirmed malignant cells consistent with adenocarcioma. Immunohistochemical stains were positive for TTF-1, and negative for p63 and CK5/6. 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 Her FEV1 was 1.98, which is 88% of predicted and her FVC was 2.58, which is 89% of predicted. She was deemed a candidate for resection and met with [place] [facility] for consultation. As she is not motivated to quit smoking, he felt that surgery would present an excessive risk for postoperative respiratory complications. He recommended SBRT as a good option if the patient was unable to quit smoking.  The patient went for a second opinion to [place] Hunt and surgical resection was carried out in [place] on April 12 with a left lower lobe lobectomy.  She was hospitalized for 13 days and has not smoked since that time.  Pathology revealed a 2.5 cm adenocarcinoma, grade 2.  There was no evidence of pleural invasion and margins are clear but she tells me 2 of 20 nodes were positive, for a T1c N1 M0, stage IIB.  A port was placed on August 16 and she was started on pembrolizumab on September 7 and had her second dose on September 28. She refused chemotherapy. We recommend a full year of the pembrolizumab.   I have reviewed her chart and materials related to her cancer extensively and collaborated history with the patient. Summary of oncologic history is as follows:    Oncology History  Non-small cell lung cancer, left (HCC)  01/15/2021 Initial Diagnosis    Non-small cell lung cancer, left (HCC)    02/12/2021 Cancer Staging    Staging form: Lung, AJCC 8th Edition - Clinical stage from 02/12/2021: Stage IA2 (cT1b, cN0, cM0) - Signed by Sydney Beckwith, MD on 02/14/2021 Histopathologic type: Adenocarcinoma, NOS Stage prefix: Initial diagnosis Histologic grade (G): GX Histologic grading Hunt: 4 grade  Hunt Laterality: Left Tumor size (mm): 19 Lymph-vascular invasion (LVI): LVI not present (absent)/not identified Diagnostic confirmation: Positive histology Specimen type: Bronchial Biopsy Staged by: Managing physician Stage used in treatment planning: Yes National guidelines used in treatment planning: Yes Type of national guideline used in treatment planning: NCCN Staging comments: Rec surgical resection    05/11/2021 Cancer Staging    Staging form: Lung, AJCC 8th Edition - Pathologic stage from 05/11/2021: Stage IIB (ypT1c, pN1, cM0) - Signed by Sydney Beckwith, MD on 11/12/2021 Histopathologic type: Adenocarcinoma, NOS Stage prefix: Post-therapy Histologic grade (G): G2 Histologic grading Hunt: 4 grade Hunt Residual tumor (R): R0 - None Laterality: Left Tumor size (mm): 25 Lymph-vascular invasion (LVI): LVI not present (absent)/not identified Diagnostic confirmation: Positive histology PLUS positive immunophenotyping and/or positive genetic studies Specimen type: Excision Staged by: Managing physician Type of lung cancer: Surgically resected non-small cell lung cancer ECOG performance status: Grade 1 Perineural invasion (PNI): Absent Pleural/elastic layer invasion: PL0 Pleural lavage cytology: Unknown Weight loss: Absent  Adequacy of mediastinal dissection: Adequate Radiotherapy dose: No Adjuvant radiation: No Adjuvant chemotherapy: Yes Stage used in treatment planning: Yes National guidelines used in treatment planning: Yes Type of national guideline used in treatment planning: NCCN Staging comments: Adjuvant pembrolizumab, PDL 1 90%    10/04/2021 -  Chemotherapy    Patient is on Treatment Plan : LUNG NSCLC Pembrolizumab Adj q21d x 18 cycles      INTERVAL HISTORY:  S[place] is here today for a routine follow up for adenocarcinoma of the left lung, on adjuvant immunotherapy. Patient states that she feels well but says she has had cough for a while which is  productive of clear sputum and triggered by dust. I advised her to increase her fluid consumption to thin out the mucous. Her day 1 cycle 16 of Keytruda is scheduled for 09/09/2022. Her hemoglobin is 13.4, WBC is 11 with 7.3% eosinophils, and platelet count is 251,000 today. Her CMP is pending.  She will have a CT chest before her next appointment.  I will see her  back in 3 weeks with CBC, CMP, CEA and CT chest. She will then have her last treatment and we will go to 3 month follow up after that. She  denies signs of infections such as sore throat, sinus drainage, cough or urinary symptoms. She  denies fever or recurrent chills. She  also deny nausea, vomiting, chest pain, or dyspnea. Her  appetite is good and Her  weight has decreased 1 pounds over last 2 weeks     HISTORY:   Past Medical History:  Diagnosis Date   Allergy    Arthritis    Complication of anesthesia    slow to wake up   COPD (chronic obstructive pulmonary disease) (HCC)    COPD, mild (HCC) 12/15/2020   Family history of pancreatic cancer 11/28/2020   Family history of prostate cancer 11/28/2020   Family history of stomach cancer 11/28/2020   Genetic testing 12/18/2020   [facility] Multi-Cancer Panel was Negative. Report date is 12/12/2020.     The Multi-Cancer + RNA Panel offered by [facility] includes sequencing and/or deletion/duplication analysis of the following 84 genes:  AIP*, ALK, APC*, ATM*, AXIN2*, BAP1*, BARD1*, BLM*, BMPR1A*, BRCA1*, BRCA2*, BRIP1*, CASR, CDC73*, CDH1*, CDK4, CDKN1B*, CDKN1C*, CDKN2A, CEBPA, CHEK2*, CTNNA1*, DICER1*, DIS3L2*, EGFR, EPCAM, FH   GERD (gastroesophageal reflux disease)    Heartburn 12/27/2021   Hormone disorder 11/20/2020   Hypertension    Hypokalemia 12/27/2021   Leukocytosis 01/17/2022   Migraines    Nodule of left lung 11/21/2020   Non-small cell lung cancer, left (HCC) 01/15/2021   Tobacco use 12/07/2020   Past Surgical History:  Procedure Laterality Date   ABDOMINAL HYSTERECTOMY   1989   unsure of vaginal or abdominal   BRONCHIAL BIOPSY  01/15/2021   Procedure: BRONCHIAL BIOPSIES;  Surgeon: Leslye Peer, MD;  Location: [facility] ENDOSCOPY;  Service: Pulmonary;;   BRONCHIAL BRUSHINGS  01/15/2021   Procedure: BRONCHIAL BRUSHINGS;  Surgeon: Leslye Peer, MD;  Location: [facility] ENDOSCOPY;  Service: Pulmonary;;   BRONCHIAL NEEDLE ASPIRATION BIOPSY  01/15/2021   Procedure: BRONCHIAL NEEDLE ASPIRATION BIOPSIES;  Surgeon: Leslye Peer, MD;  Location: [facility] ENDOSCOPY;  Service: Pulmonary;;   COLONOSCOPY  2022   FIDUCIAL MARKER PLACEMENT  01/15/2021   Procedure: FIDUCIAL MARKER PLACEMENT;  Surgeon: Leslye Peer, MD;  Location: [facility] ENDOSCOPY;  Service: Pulmonary;;   ovaries removed  1986   surgery to remove scar tissue     from colon/bladder in  patient's late 30's   TUBAL LIGATION  1986   UPPER GI ENDOSCOPY     years ago in patient's late 36s   VIDEO BRONCHOSCOPY WITH RADIAL ENDOBRONCHIAL ULTRASOUND  01/15/2021   Procedure: VIDEO BRONCHOSCOPY WITH RADIAL ENDOBRONCHIAL ULTRASOUND;  Surgeon: Leslye Peer, MD;  Location: [facility] ENDOSCOPY;  Service: Pulmonary;;   Family History  Problem Relation Age of Onset   Pancreatic cancer Maternal Aunt 51   Brain cancer Maternal Uncle    Stomach cancer Maternal Uncle        dx. >50   Prostate cancer Half-Brother        passed away at 35   Prostate cancer Half-Brother    Social History:  reports that she quit smoking about 10 months ago. Her smoking use included cigarettes. She started smoking about 61 years ago. She has a 29.50 pack-year smoking history. She has never used smokeless tobacco. She reports current alcohol use of about 4.0 standard drinks of alcohol per week. She reports current drug use. Frequency: 3.00 times per week. Drug: Marijuana. She is widowed and lives at home.  She has quit smoking since April 12, when she had her surgical resection.  She has 2 biological children and 1 stepchild. She is disabled, and has been exposed to  chemicals or other toxic agents. She was previously employed at a hosiery, chicken farm and cleaning houses. 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   Allergies  Allergen Reactions   Codeine Rash   Demerol [Meperidine Hcl] Other (See Comments)    coma   Meperidine Other (See Comments)    Several days will pass without patient being aware   Other Dermatitis    Stainless steel / skin weeps   Silver Dermatitis    Sterling silver skin weeps   Gold Bond [Menthol-Zinc Oxide] Dermatitis    Skin weeps   Neosporin [Bacitracin-Polymyxin B] Dermatitis    Skin weeps   Wound Dressing Adhesive Other (See Comments)    CAUSES SKIN TEARS PER PATIENT. INSTEAD USE THE TEGADERM 25M WITH ADHESIVE FREE WINDOW OVER PORT.    Current Medications:       Current Outpatient Medications  Medication Sig Dispense Refill   albuterol (VENTOLIN HFA) 108 (90 Base) MCG/ACT inhaler Inhale 2 puffs into the lungs every 4 (four) hours as needed for wheezing or shortness of breath.       amitriptyline (ELAVIL) 10 MG tablet Take 10 mg by mouth at bedtime as needed for sleep.       Ascorbic Acid (VITAMIN C WITH ROSE HIPS) 500 MG tablet Take 500 mg by mouth daily.       aspirin 81 MG EC tablet Take 81 mg by mouth daily.       atorvastatin (LIPITOR) 40 MG tablet Take 40 mg by mouth daily.       b complex vitamins capsule Take 1 capsule by mouth daily. (Patient not taking: Reported on 01/17/2022)       Biotin 5000 MCG CAPS Take 5,000 mcg by mouth daily.       Calcium-Vitamin D-Vitamin K (CHEWABLE CALCIUM PO) Take 650 mg by mouth daily.       celecoxib (CELEBREX) 200 MG capsule Take 200 mg by mouth daily.       fluconazole (DIFLUCAN) 100 MG tablet Take 1 tablet (100 mg total) by mouth daily. 10 tablet 0   gabapentin (NEURONTIN) 300 MG capsule Take 300 mg by mouth at bedtime.  hydrochlorothiazide (HYDRODIURIL) 25 MG tablet Take 25 mg by mouth every morning.       HYDROcodone-acetaminophen  (NORCO/VICODIN) 5-325 MG tablet Take 1 tablet by mouth every 6 (six) hours as needed for moderate pain or severe pain. 30 tablet 0   levothyroxine (SYNTHROID) 50 MCG tablet Take 1 tablet (50 mcg total) by mouth daily before breakfast. 30 tablet 2   lidocaine-prilocaine (EMLA) cream Apply 1 Application topically as needed. 30 g 0   omeprazole (PRILOSEC) 40 MG capsule Take 1 capsule (40 mg total) by mouth daily. 30 capsule 2   ondansetron (ZOFRAN) 4 MG tablet Take 1 tablet (4 mg total) by mouth every 4 (four) hours as needed for nausea. 90 tablet 3   potassium chloride SA (KLOR-CON M) 20 MEQ tablet Take 20 mEq by mouth daily.       prochlorperazine (COMPAZINE) 10 MG tablet Take 1 tablet (10 mg total) by mouth every 6 (six) hours as needed for nausea or vomiting. 30 tablet 2   vitamin E 180 MG (400 UNITS) capsule Take 400 Units by mouth daily. (Patient not taking: Reported on 01/17/2022)        REVIEW OF SYSTEMS:  Review of Systems  Constitutional:  Positive for fatigue. Negative for appetite change, chills, diaphoresis, fever and unexpected weight change.  HENT:  Negative.  Negative for hearing loss, lump/mass, mouth sores, nosebleeds, sore throat, tinnitus, trouble swallowing and voice change.   Eyes: Negative.  Negative for eye problems and icterus.  Respiratory:  Positive for cough (white sputum). Negative for chest tightness, hemoptysis, shortness of breath and wheezing.   Cardiovascular: Negative.  Negative for chest pain, leg swelling and palpitations.  Gastrointestinal:  Negative for abdominal distention, abdominal pain, blood in stool, constipation, diarrhea, nausea, rectal pain and vomiting.  Endocrine: Negative.   Genitourinary: Negative.  Negative for bladder incontinence, difficulty urinating, dyspareunia, dysuria, frequency, hematuria, menstrual problem, nocturia, pelvic pain, vaginal bleeding and vaginal discharge.   Musculoskeletal: Negative.  Negative for arthralgias, back pain,  flank pain, gait problem, myalgias, neck pain and neck stiffness.  Skin: Negative.  Negative for itching, rash and wound.  Neurological:  Negative for dizziness, extremity weakness, gait problem, headaches, light-headedness, numbness, seizures and speech difficulty.  Hematological: Negative.  Negative for adenopathy. Does not bruise/bleed easily.  Psychiatric/Behavioral: Negative.  Negative for confusion, decreased concentration, depression, sleep disturbance and suicidal ideas. The patient is not nervous/anxious.        Dissociative identity disorder   VITALS:  Blood pressure 126/87, pulse 65, temperature 98 F (36.7 C), temperature source Oral, resp. rate 17, height 4\' 11"  (1.499 m), weight 132 lb 4.8 oz (60 kg), SpO2 99 %.     Wt Readings from Last 3 Encounters:  03/04/22 131 lb (59.4 kg)  02/28/22 132 lb 4.8 oz (60 kg)  02/07/22 137 lb 3.2 oz (62.2 kg)    Body mass index is 26.72 kg/m.   Performance status (ECOG): 1 - Symptomatic but completely ambulatory   PHYSICAL EXAM:  Physical Exam Vitals and nursing note reviewed.  Constitutional:      General: She is not in acute distress.    Appearance: Normal appearance. She is normal weight. She is not ill-appearing, toxic-appearing or diaphoretic.  HENT:     Head: Normocephalic and atraumatic.     Right Ear: Tympanic membrane, ear canal and external ear normal. There is no impacted cerumen.     Left Ear: Tympanic membrane, ear canal and external ear normal. There is no impacted  cerumen.     Nose: Nose normal. No congestion or rhinorrhea.     Mouth/Throat:     Mouth: Mucous membranes are moist.     Pharynx: Oropharynx is clear. No oropharyngeal exudate or posterior oropharyngeal erythema.  Eyes:     General: No scleral icterus.       Right eye: No discharge.        Left eye: No discharge.     Extraocular Movements: Extraocular movements intact.     Conjunctiva/sclera: Conjunctivae normal.     Pupils: Pupils are equal, round, and  reactive to light.  Neck:     Vascular: No carotid bruit.  Cardiovascular:     Rate and Rhythm: Normal rate and regular rhythm.     Pulses: Normal pulses.     Heart sounds: Normal heart sounds. No murmur heard.    No friction rub. No gallop.  Pulmonary:     Effort: Pulmonary effort is normal. No respiratory distress.     Breath sounds: Normal breath sounds. No stridor. No wheezing, rhonchi or rales.  Chest:     Chest wall: No tenderness.  Abdominal:     General: Bowel sounds are normal. There is no distension.     Palpations: Abdomen is soft. There is no hepatomegaly, splenomegaly or mass.     Tenderness: There is no abdominal tenderness. There is no right CVA tenderness, left CVA tenderness, guarding or rebound.     Hernia: No hernia is present.  Musculoskeletal:        General: No swelling, tenderness, deformity or signs of injury. Normal range of motion.     Cervical back: Normal range of motion and neck supple. No rigidity or tenderness.     Right lower leg: No edema.     Left lower leg: No edema.  Lymphadenopathy:     Cervical: No cervical adenopathy.     Right cervical: No superficial, deep or posterior cervical adenopathy.    Left cervical: No superficial, deep or posterior cervical adenopathy.     Upper Body:     Right upper body: No supraclavicular, axillary or pectoral adenopathy.     Left upper body: No supraclavicular, axillary or pectoral adenopathy.  Skin:    General: Skin is warm and dry.     Coloration: Skin is not jaundiced or pale.     Findings: No bruising, erythema, lesion or rash.  Neurological:     General: No focal deficit present.     Mental Status: She is alert and oriented to person, place, and time. Mental status is at baseline.     Cranial Nerves: No cranial nerve deficit.     Sensory: No sensory deficit.     Motor: No weakness.     Coordination: Coordination normal.     Gait: Gait normal.     Deep Tendon Reflexes: Reflexes normal.  Psychiatric:         Mood and Affect: Mood normal.        Behavior: Behavior normal.        Thought Content: Thought content normal.        Judgment: Judgment normal.     LABS:             Component Ref Range & Units 2 wk ago (08/15/22) 1 mo ago (07/25/22) 2 mo ago (07/02/22) 2 mo ago (06/11/22) 3 mo ago (05/23/22) 3 mo ago (05/16/22) 4 mo ago (05/02/22)  Hemoglobin 12.0 - 16.0 13.4 13.4 13.7 13.4 13.2 14.7 14.0  HCT 36 - 46 39 39 40 39 38 44 40  Neutrophils Absolute 4.41 4.94 5.51 4.93 4.55 4.70 5.54  Platelets 150 - 400 K/uL 249 280 283 286 308 305 326  WBC 9.0 10.5 10.2 8.8 9.1 8.6 9.9      Component Ref Range & Units 2 wk ago (08/15/22) 1 mo ago (07/25/22) 2 mo ago (07/02/22) 2 mo ago (06/11/22) 3 mo ago (05/23/22) 3 mo ago (05/16/22) 4 mo ago (05/02/22)  Glucose 111 126 High  R, CM 143 110 99 R, CM 105 90  BUN 4 - 21 13 14  R 13 14 8  R 10 11  CO2 13 - 22 28 Abnormal  26 R 26 Abnormal  29 Abnormal  31 R 30 Abnormal  30 Abnormal   Creatinine 0.5 - 1.1 0.8 0.96 R 0.8 0.9 0.97 R 1.0 0.9  Potassium 3.5 - 5.1 mEq/L 4.4 4.1 R 4.5 3.4 Abnormal  3.0 Low  R 3.0 Abnormal  3.0 Abnormal   Sodium 137 - 147 140 140 R 138 140 139 R 139 140  Chloride 99 - 108 107 105 R 103 101 100 R 94 Abnormal  101              Component Ref Range & Units 2 wk ago (08/15/22) 1 mo ago (07/25/22) 2 mo ago (07/02/22) 2 mo ago (06/11/22) 3 mo ago (05/23/22) 3 mo ago (05/16/22) 4 mo ago (05/02/22)  Calcium 8.7 - 10.7 9.7 9.3 R 9.6 9.7 9.1 R 10.1 9.6  Albumin 3.5 - 5.0 4.1 3.9 R 4.2 4.1 3.9 R 4.6 4.2              Component Ref Range & Units 2 wk ago (08/15/22) 1 mo ago (07/25/22) 2 mo ago (06/11/22) 3 mo ago (05/23/22) 4 mo ago (05/02/22) 4 mo ago (04/11/22) 6 mo ago (02/28/22)  TSH 0.350 - 4.500 uIU/mL 0.112 Low  0.474 CM 2.618 CM 14.880 High  CM 9.734 High  CM 16.977 High  CM 47.710 High        Component Ref Range & Units 2 wk ago (08/15/22) 1 mo ago (07/25/22) 2 mo ago (06/11/22) 3 mo ago (05/23/22) 4 mo  ago (05/02/22) 4 mo ago (04/11/22) 6 mo ago (02/28/22)  T4, Total 4.5 - 12.0 ug/dL 16.1 09.6 CM 04.5 CM 7.1 CM 9.0 CM 9.0 CM 8.5 CM     STUDIES:  Exam: 07/02/2022 CT Chest with Contrast Impression: Stable exam with numerous scattered bilateral tiny pulmonary nodules are not substantially changed in the interval. No definite new suspicious nodules or mass. Continued close attention on follow-up recommended.   Mild circumferential wall thickening in the distal esophagus, and stable in the interval. Esophagitis would be a consideration. Aortic Atherosclerosis (ICD10-170.0) and Emphysema (ICD10-J43.9)   EXAM: 03/19/2022 CT CHEST WITH CONTRAST IMPRESSION Numerous small scattered bilateral pulmonary nodules are all stable, including persistent stable lingular nodules that were new on her 11/23/2021 CT scan. The persistence of these nodules may indicate pulmonary metastases.  No new or progressive metastatic disease in the chest Trace basilar left pleural effusion decreased.  Aortic Atherosclerosis (ICD10-170.0) and Emphysema. ([id number])      I,Oluwatobi Asade,acting as a scribe for Sydney Beckwith, MD.,have documented all relevant documentation on the behalf of Sydney Beckwith, MD,as directed by  Sydney Beckwith, MD while in the presence of Sydney Beckwith, MD.

## 2022-09-05 ENCOUNTER — Other Ambulatory Visit: Payer: Self-pay | Admitting: Oncology

## 2022-09-05 ENCOUNTER — Inpatient Hospital Stay (INDEPENDENT_AMBULATORY_CARE_PROVIDER_SITE_OTHER): Payer: Medicare HMO | Admitting: Oncology

## 2022-09-05 ENCOUNTER — Encounter: Payer: Self-pay | Admitting: Oncology

## 2022-09-05 ENCOUNTER — Inpatient Hospital Stay: Payer: Medicare HMO | Attending: Hematology and Oncology

## 2022-09-05 VITALS — BP 130/83 | HR 68 | Temp 98.1°F | Resp 18 | Ht 59.0 in | Wt 109.5 lb

## 2022-09-05 DIAGNOSIS — C3492 Malignant neoplasm of unspecified part of left bronchus or lung: Secondary | ICD-10-CM

## 2022-09-05 DIAGNOSIS — Z5112 Encounter for antineoplastic immunotherapy: Secondary | ICD-10-CM | POA: Insufficient documentation

## 2022-09-05 DIAGNOSIS — Z79899 Other long term (current) drug therapy: Secondary | ICD-10-CM | POA: Insufficient documentation

## 2022-09-05 DIAGNOSIS — C3432 Malignant neoplasm of lower lobe, left bronchus or lung: Secondary | ICD-10-CM | POA: Insufficient documentation

## 2022-09-05 DIAGNOSIS — Z95828 Presence of other vascular implants and grafts: Secondary | ICD-10-CM | POA: Insufficient documentation

## 2022-09-05 DIAGNOSIS — E876 Hypokalemia: Secondary | ICD-10-CM | POA: Diagnosis not present

## 2022-09-05 HISTORY — DX: Presence of other vascular implants and grafts: Z95.828

## 2022-09-05 LAB — CBC AND DIFFERENTIAL
HCT: 40 (ref 36–46)
Hemoglobin: 13.4 (ref 12.0–16.0)
Neutrophils Absolute: 5.72
Platelets: 251 10*3/uL (ref 150–400)
WBC: 11

## 2022-09-05 LAB — CMP (CANCER CENTER ONLY)
ALT: 11 U/L (ref 0–44)
AST: 18 U/L (ref 15–41)
Albumin: 4 g/dL (ref 3.5–5.0)
Alkaline Phosphatase: 58 U/L (ref 38–126)
Anion gap: 6 (ref 5–15)
BUN: 11 mg/dL (ref 8–23)
CO2: 27 mmol/L (ref 22–32)
Calcium: 9.5 mg/dL (ref 8.9–10.3)
Chloride: 106 mmol/L (ref 98–111)
Creatinine: 0.79 mg/dL (ref 0.44–1.00)
GFR, Estimated: >60 mL/min (ref >60)
Glucose, Bld: 106 mg/dL — ABNORMAL HIGH (ref 70–99)
Potassium: 3.9 mmol/L (ref 3.5–5.1)
Sodium: 139 mmol/L (ref 135–145)
Total Bilirubin: 1.1 mg/dL (ref 0.3–1.2)
Total Protein: 6.9 g/dL (ref 6.5–8.1)

## 2022-09-05 LAB — CBC: RBC: 4.2 (ref 3.87–5.11)

## 2022-09-05 LAB — CBC W DIFFERENTIAL (~~LOC~~ CC SCANNED REPORT)

## 2022-09-05 LAB — TSH: TSH: 5.123 u[IU]/mL — ABNORMAL HIGH (ref 0.350–4.500)

## 2022-09-05 MED ORDER — HEPARIN SOD (PORK) LOCK FLUSH 100 UNIT/ML IV SOLN
500.0000 [IU] | Freq: Once | INTRAVENOUS | Status: AC | PRN
  Administered 2022-09-05: 500 [IU]

## 2022-09-05 MED ORDER — SODIUM CHLORIDE 0.9% FLUSH
10.0000 mL | INTRAVENOUS | Status: DC | PRN
  Administered 2022-09-05: 10 mL

## 2022-09-09 ENCOUNTER — Inpatient Hospital Stay: Payer: Medicare HMO

## 2022-09-09 ENCOUNTER — Encounter: Payer: Self-pay | Admitting: Oncology

## 2022-09-09 VITALS — BP 125/68 | HR 62 | Temp 97.8°F | Resp 20 | Ht 59.0 in | Wt 109.1 lb

## 2022-09-09 DIAGNOSIS — C3492 Malignant neoplasm of unspecified part of left bronchus or lung: Secondary | ICD-10-CM

## 2022-09-09 DIAGNOSIS — Z5112 Encounter for antineoplastic immunotherapy: Secondary | ICD-10-CM | POA: Diagnosis not present

## 2022-09-09 MED ORDER — SODIUM CHLORIDE 0.9 % IV SOLN: Freq: Once | INTRAVENOUS | Status: AC

## 2022-09-09 MED ORDER — SODIUM CHLORIDE 0.9 % IV SOLN
200.0000 mg | Freq: Once | INTRAVENOUS | Status: AC
  Administered 2022-09-09: 200 mg via INTRAVENOUS
  Filled 2022-09-09: qty 8

## 2022-09-09 MED ORDER — SODIUM CHLORIDE 0.9% FLUSH
10.0000 mL | INTRAVENOUS | Status: DC | PRN
  Administered 2022-09-09: 10 mL

## 2022-09-09 MED ORDER — HEPARIN SOD (PORK) LOCK FLUSH 100 UNIT/ML IV SOLN
500.0000 [IU] | Freq: Once | INTRAVENOUS | Status: AC | PRN
  Administered 2022-09-09: 500 [IU]

## 2022-09-09 NOTE — Patient Instructions (Signed)

## 2022-09-17 ENCOUNTER — Other Ambulatory Visit: Payer: Self-pay

## 2022-09-20 ENCOUNTER — Encounter: Payer: Self-pay | Admitting: Oncology

## 2022-09-23 ENCOUNTER — Encounter: Payer: Self-pay | Admitting: Oncology

## 2022-09-24 ENCOUNTER — Other Ambulatory Visit: Payer: Self-pay

## 2022-09-24 LAB — HEPATIC FUNCTION PANEL
ALT: 16 U/L (ref 7–35)
AST: 31 (ref 13–35)
Alkaline Phosphatase: 71 (ref 25–125)
Bilirubin, Total: 1.7

## 2022-09-24 LAB — COMPREHENSIVE METABOLIC PANEL
Albumin: 4.5 (ref 3.5–5.0)
Calcium: 10 (ref 8.7–10.7)

## 2022-09-24 LAB — BASIC METABOLIC PANEL
BUN: 13 (ref 4–21)
CO2: 28 — AB (ref 13–22)
Chloride: 97 — AB (ref 99–108)
Creatinine: 0.7 (ref 0.5–1.1)
Glucose: 113
Potassium: 3 mEq/L — AB (ref 3.5–5.1)
Sodium: 138 (ref 137–147)

## 2022-09-24 LAB — CBC AND DIFFERENTIAL
HCT: 41 (ref 36–46)
Hemoglobin: 14.1 (ref 12.0–16.0)
Neutrophils Absolute: 7.32
Platelets: 292 10*3/uL (ref 150–400)
WBC: 12.4

## 2022-09-24 LAB — CBC: RBC: 4.31 (ref 3.87–5.11)

## 2022-09-25 ENCOUNTER — Encounter: Payer: Self-pay | Admitting: Oncology

## 2022-09-26 ENCOUNTER — Inpatient Hospital Stay (INDEPENDENT_AMBULATORY_CARE_PROVIDER_SITE_OTHER): Payer: Medicare HMO | Admitting: Hematology and Oncology

## 2022-09-26 ENCOUNTER — Telehealth: Payer: Self-pay | Admitting: Hematology and Oncology

## 2022-09-26 ENCOUNTER — Telehealth: Payer: Self-pay

## 2022-09-26 ENCOUNTER — Encounter: Payer: Self-pay | Admitting: Hematology and Oncology

## 2022-09-26 VITALS — BP 106/74 | HR 77 | Temp 98.1°F | Resp 20 | Ht 59.0 in | Wt 105.2 lb

## 2022-09-26 DIAGNOSIS — C3492 Malignant neoplasm of unspecified part of left bronchus or lung: Secondary | ICD-10-CM

## 2022-09-26 DIAGNOSIS — E876 Hypokalemia: Secondary | ICD-10-CM | POA: Diagnosis not present

## 2022-09-26 DIAGNOSIS — R12 Heartburn: Secondary | ICD-10-CM

## 2022-09-26 HISTORY — DX: Other disorders of bilirubin metabolism: E80.6

## 2022-09-26 MED ORDER — PANTOPRAZOLE SODIUM 40 MG PO TBEC: 40.0000 mg | DELAYED_RELEASE_TABLET | Freq: Every day | ORAL | 2 refills | Status: DC

## 2022-09-26 NOTE — Telephone Encounter (Signed)
09/26/22 Spoke with patient and confirmed next appts.

## 2022-09-26 NOTE — Telephone Encounter (Signed)
Attempted to contact patient. No answer. 

## 2022-09-26 NOTE — Assessment & Plan Note (Signed)
Chronic hypokalemia.  Her potassium was well within normal limits on potassium chloride 60 mEq twice daily, so this was decreased to 40 meq twice daily, but the patient has only been taking 40 mg daily.  She will resume potassium chloride 40 meq twice daily.  I will plan to see her back in 3 weeks for continued management.

## 2022-09-26 NOTE — Progress Notes (Signed)
Sydney Hunt Sydney Hunt  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  She was treated with left lower lobectomy in [place].  Her doctor there recommended chemotherapy with immunotherapy, but she refused chemotherapy.  PD-L1 was positive.  She was only willing to take the immunotherapy for 1 year. She started pembrolizumab in August 2023.  We saw her to re-establish care in October 2023, when she returned to [place].  We have continued pembrolizumab every 21 days. Routine CT imaging has not shown any evidence of recurrence.  CT chest on August 27 was once again negative for recurrence.  Surgical changes were seen in the left lower lobe.  There were stable scattered small bilateral pulmonary nodules felt to be likely benign emphysema and aortic atherosclerosis were seen.  She has leukocytosis and mild hyperbilirubinemia, both of which have been intermittent.  She has no obvious infection.  We will continue to monitor these.  She will proceed with 17th and final cycle of adjuvant pembrolizumab next week.  I will plan to see her back in 3 weeks for continued supportive care.    Hypokalemia Chronic hypokalemia.  Her potassium was well within normal limits on potassium chloride 60 mEq twice daily, so this was decreased to 40 meq twice daily, but the patient has only been taking 40 mg daily.  She will resume potassium chloride 40 meq twice daily.  I will plan to see her back in 3 weeks for continued management.  Heartburn Chronic heartburn despite increasing omeprazole 40 mg twice daily.  I will place her on pantoprazole 40 mg twice daily.  Hyperbilirubinemia Intermittent mild hyperbilirubinemia of uncertain etiology.  Will continue to  monitor this.    The patient understands the plans discussed today and is in agreement with them.  She knows to contact our office if she develops concerns prior to her next appointment.   I provided 15 minutes of face-to-face time during this encounter and > 50% was spent counseling as documented under my assessment and plan.    [name], PA-C  [facility] [address] [place] [address] Dept: [phone] Dept Fax: [phone]   Orders Placed This Encounter  Procedures   CBC with Differential (Cancer Center Only)    Standing Status:   Future    Standing Expiration Date:   09/26/2023   CMP (Cancer Center only)    Standing Status:   Future    Standing Expiration Date:   09/26/2023   TSH    Standing Status:   Future    Standing Expiration Date:   09/26/2023      CHIEF COMPLAINT:  CC: Stage IIB PD-L1 positive adenocarcinoma of the lung  Current Treatment: Adjuvant pembrolizumab every 3 weeks  HISTORY OF PRESENT ILLNESS:  Sydney Hunt is a 62 y.o. female with a history of stage IIB (T1 cN1 M0) adenocarcinoma of the left lung noticed in September 2022 this was originally seen on lung cancer screening CT chest. PET in October revealed a solitary 1.25 cm left lower lobe pulmonary nodule to be hypermetabolic and consistent with small primary lung neoplasm. No findings for mediastinal or hilar adenopathy or metastatic disease elsewhere was observed. She met with the  genetic counselor and [facility] diagnostic testing was negative for any clinically significant mutations. CT chest from December revealed an enlarging left lower lobe nodule, measuring 1.4 x 1.9 cm. The additional smaller pulmonary nodules remained unchanged. She was referred to Dr. Levy Pupa and underwent bronchoscopy in December.  Cytology revealed malignant cells consistent with adenocarcinoma. Immunohistochemical stains were positive for TTF-1, and  negative for p63 and CK5/6. 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 Her FEV1 was 1.98, which is 88% of predicted and her FVC was 2.58, which is 89% of predicted. She was deemed a candidate for resection and met with Dr. Cliffton Asters for consultation. As she was not motivated to quit smoking, he felt that surgery would present an excessive risk for postoperative respiratory complications. He recommended SBRT as a good option if the patient was unable to quit smoking.  The patient went for a second opinion to Dr. Lucienne Minks and underwent left lower lobectomy in [place] in April 2023.  She was hospitalized for 13 days and has not smoked since that time.  Pathology revealed a 2.5 cm, grade 2, adenocarcinoma. 2/20 nodes were positive for metastasis. There was no evidence of pleural invasion and margins were clear.  Her doctor in [place] recommended chemotherapy with immunotherapy, but she refused chemotherapy.  PD-L1 was positive.  She was only willing to take the immunotherapy for 1 year. She started pembrolizumab in August 2023.  We saw her to re-establish care in October 2023, when she returned to [place].  We have continued pembrolizumab every 21 days. Routine CT imaging has not shown any evidence of recurrence.  She has had intermittent elevation of the white blood count and bilirubin and treatment.  She also has had hypokalemia treated with oral supplementation, as she declined IV.    Oncology History  Non-small cell lung cancer, left (HCC)  01/15/2021 Initial Diagnosis   Non-small cell lung cancer, left (HCC)   02/12/2021 Cancer Staging   Staging form: Lung, AJCC 8th Edition - Clinical stage from 02/12/2021: Stage IA2 (cT1b, cN0, cM0) - Signed by Dellia Beckwith, MD on 02/14/2021 Histopathologic type: Adenocarcinoma, NOS Stage prefix: Initial diagnosis Histologic grade (G): GX Histologic grading system: 4 grade system Laterality: Left Tumor size (mm):  19 Lymph-vascular invasion (LVI): LVI not present (absent)/not identified Diagnostic confirmation: Positive histology Specimen type: Bronchial Biopsy Staged by: Managing physician Stage used in treatment planning: Yes National guidelines used in treatment planning: Yes Type of national guideline used in treatment planning: NCCN Staging comments: Rec surgical resection   05/11/2021 Cancer Staging   Staging form: Lung, AJCC 8th Edition - Pathologic stage from 05/11/2021: Stage IIB (ypT1c, pN1, cM0) - Signed by Dellia Beckwith, MD on 11/12/2021 Histopathologic type: Adenocarcinoma, NOS Stage prefix: Post-therapy Histologic grade (G): G2 Histologic grading system: 4 grade system Residual tumor (R): R0 - None Laterality: Left Tumor size (mm): 25 Lymph-vascular invasion (LVI): LVI not present (absent)/not identified Diagnostic confirmation: Positive histology PLUS positive immunophenotyping and/or positive genetic studies Specimen type: Excision Staged by: Managing physician Type of lung cancer: Surgically resected non-small cell lung cancer ECOG performance status: Grade 1 Perineural invasion (PNI): Absent Pleural/elastic layer invasion: PL0 Pleural lavage cytology: Unknown Weight loss: Absent Adequacy of mediastinal dissection: Adequate Radiotherapy dose: No Adjuvant radiation: No Adjuvant chemotherapy: Yes Stage used in treatment planning: Yes National guidelines used in treatment planning: Yes Type of national guideline used in treatment planning: NCCN Staging comments: Adjuvant pembrolizumab, PDL 1 90%   10/04/2021 -  Chemotherapy   Patient is on Treatment Plan : LUNG NSCLC Pembrolizumab Adj q21d x 18 cycles         INTERVAL HISTORY:  Sydney Hunt is here today for repeat clinical assessment prior to a 17th and final cycle of pembrolizumab.  She states she has only been taking potassium chloride 40 mg daily.  She reports improvement in her cough, but persistent sinus congestion  which she attributes to allergies.  She denies fevers or chills.  She reports heartburn despite omeprazole 40 mg twice daily.  She reports intermittent nausea and vomiting over the past week, which mainly seems to be clear fluid/acid.  She is chronic right knee and hip pain which is unchanged. Her appetite is fairly good. Her weight has decreased 5 pounds over last 3 weeks .  She struggles with anxiety despite medication.  She continues to smoke marijuana a few times a week.  REVIEW OF SYSTEMS:  Review of Systems  Constitutional:  Positive for fatigue. Negative for appetite change, chills, fever and unexpected weight change.  HENT:   Negative for lump/mass, mouth sores and sore throat.   Respiratory:  Negative for cough and shortness of breath.   Cardiovascular:  Negative for chest pain and leg swelling.  Gastrointestinal:  Positive for nausea and vomiting. Negative for abdominal pain, constipation and diarrhea.  Endocrine: Negative for hot flashes.  Genitourinary:  Negative for difficulty urinating, dysuria, frequency and hematuria.   Musculoskeletal:  Negative for arthralgias, back pain and myalgias.  Skin:  Negative for rash.  Neurological:  Negative for dizziness and headaches.  Hematological:  Negative for adenopathy. Does not bruise/bleed easily.  Psychiatric/Behavioral:  Negative for depression and sleep disturbance. The patient is nervous/anxious.      VITALS:  Blood pressure 106/74, pulse 77, temperature 98.1 F (36.7 C), temperature source Oral, resp. rate 20, height 4\' 11"  (1.499 m), weight 105 lb 3.2 oz (47.7 kg), SpO2 96%.  Wt Readings from Last 3 Encounters:  09/26/22 105 lb 3.2 oz (47.7 kg)  09/09/22 109 lb 1.3 oz (49.5 kg)  09/05/22 109 lb 8 oz (49.7 kg)    Body mass index is 21.25 kg/m.  Performance status (ECOG): 1 - Symptomatic but completely ambulatory  PHYSICAL EXAM:  Physical Exam Vitals and nursing note reviewed.  Constitutional:      General: She is not in  acute distress.    Appearance: Normal appearance. She is not ill-appearing (chronically ill-appearing).  HENT:     Head: Normocephalic and atraumatic.     Mouth/Throat:     Mouth: Mucous membranes are moist.     Pharynx: Oropharynx is clear. No oropharyngeal exudate or posterior oropharyngeal erythema.  Eyes:     General: No scleral icterus.    Extraocular Movements: Extraocular movements intact.     Conjunctiva/sclera: Conjunctivae normal.     Pupils: Pupils are equal, round, and reactive to light.  Cardiovascular:     Rate and Rhythm: Normal rate and regular rhythm.     Heart sounds: Normal heart sounds. No murmur heard.    No friction rub. No gallop.  Pulmonary:     Effort: Pulmonary effort is normal.     Breath sounds: Normal breath sounds. No wheezing, rhonchi or rales.  Abdominal:     General: There is no distension.     Palpations: Abdomen is soft. There is no hepatomegaly, splenomegaly or mass.     Tenderness: There is no abdominal tenderness.  Musculoskeletal:        General:  Normal range of motion.     Cervical back: Normal range of motion and neck supple. No tenderness.     Right lower leg: No edema.     Left lower leg: No edema.  Lymphadenopathy:     Cervical: No cervical adenopathy.     Upper Body:     Right upper body: No supraclavicular or axillary adenopathy.     Left upper body: No supraclavicular or axillary adenopathy.     Lower Body: No right inguinal adenopathy. No left inguinal adenopathy.  Skin:    General: Skin is warm and dry.     Coloration: Skin is not jaundiced.     Findings: No rash.  Neurological:     Mental Status: She is alert and oriented to person, place, and time.     Cranial Nerves: No cranial nerve deficit.  Psychiatric:        Mood and Affect: Mood normal.        Behavior: Behavior normal.        Thought Content: Thought content normal.    LABS:      Latest Ref Rng & Units 09/24/2022   12:00 AM 09/05/2022   12:00 AM 08/15/2022    12:00 AM  CBC  WBC  12.4     11.0     9.0      Hemoglobin 12.0 - 16.0 14.1     13.4     13.4      Hematocrit 36 - 46 41     40     39      Platelets 150 - 400 K/uL 292     251     249         This result is from an external source.      Latest Ref Rng & Units 09/24/2022   12:00 AM 09/05/2022   10:08 AM 08/15/2022   12:00 AM  CMP  Glucose 70 - 99 mg/dL  098    BUN 4 - 21 13     11  13       Creatinine 0.5 - 1.1 0.7     0.79  0.8      Sodium 137 - 147 138     139  140      Potassium 3.5 - 5.1 mEq/L 3.0     3.9  4.4      Chloride 99 - 108 97     106  107      CO2 13 - 22 28     27  28       Calcium 8.7 - 10.7 10.0     9.5  9.7      Total Protein 6.5 - 8.1 g/dL  6.9    Total Bilirubin 0.3 - 1.2 mg/dL  1.1    Alkaline Phos 25 - 125 71     58  60      AST 13 - 35 31     18  25       ALT 7 - 35 U/L 16     11  14          This result is from an external source.     Lab Results  Component Value Date   CEA1 2.8 11/08/2021   /  CEA  Date Value Ref Range Status  11/08/2021 2.8 0.0 - 4.7 ng/mL Final    Comment:    (NOTE)  Nonsmokers          <3.9                             Smokers             <5.6 Roche [employer] Electrochemiluminescence Immunoassay (ECLIA) Values obtained with different assay methods or kits cannot be used interchangeably.  Results cannot be interpreted as absolute evidence of the presence or absence of malignant disease. Performed At: [facility] [address] [place], [place] [id number] Jolene Schimke MD WU:[id number]     STUDIES:    Exam(s): [id number] CT/CT CHEST W/ CM  CLINICAL DATA: Follow-up lung cancer  EXAM:  CT CHEST WITH CONTRAST  TECHNIQUE:  Multidetector CT imaging of the chest was performed during  intravenous contrast administration.  RADIATION DOSE REDUCTION: This exam was performed according to the  departmental dose-optimization program which includes automated  exposure control, adjustment of the  mA and/or kV according to  patient size and/or use of iterative reconstruction technique.  CONTRAST: 60 mL Isovue 370 IV  COMPARISON: 07/02/2022  FINDINGS:  Cardiovascular: The heart is normal in size. No pericardial  effusion.  No evidence of thoracic aortic aneurysm. Atherosclerotic  calcifications of the aortic arch.  Left chest port terminates at the cavoatrial junction.  Mediastinum/Nodes: No suspicious mediastinal lymphadenopathy.  Visualized thyroid is unremarkable.  Lungs/Pleura: Mild right apical pleural-parenchymal scarring.  Mild centrilobular and paraseptal emphysematous changes, upper lung  predominant.  Status post left lower lobectomy.  Scattered small bilateral pulmonary nodules, including a 5 mm nodule  in the right lower lobe (series 301/image 90), a 3 x 5 mm nodule in  the posterior left upper lobe (series 03/01/image 83), and a 4 mm  nodule in the lateral left upper lobe (series 301/image 69), all  unchanged.  No focal consolidation.  No pleural effusion or pneumothorax.  Upper Abdomen: Visualized upper abdomen is grossly unremarkable,  noting vascular calcifications.  Musculoskeletal: Visualized osseous structures are within normal  limits.  IMPRESSION:  Status post left lower lobectomy.  No evidence of recurrent or metastatic disease.  Stable scattered small bilateral pulmonary nodules, likely benign.  Aortic Atherosclerosis (ICD10-I70.0) and Emphysema (ICD10-J43.9).    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 Report date is 12/12/2020.     The Multi-Cancer + RNA Panel offered  by [facility] includes sequencing and/or deletion/duplication analysis of the following 84 genes:  AIP*, ALK, APC*, ATM*, AXIN2*, BAP1*, BARD1*, BLM*, BMPR1A*, BRCA1*, BRCA2*, BRIP1*, CASR, CDC73*, CDH1*, CDK4, CDKN1B*, CDKN1C*, CDKN2A, CEBPA, CHEK2*, CTNNA1*, DICER1*, DIS3L2*, EGFR, EPCAM, FH   GERD (gastroesophageal reflux disease)    Heartburn 12/27/2021   Hormone disorder 11/20/2020   Hyperbilirubinemia 09/26/2022   Hypertension    Hypokalemia 12/27/2021   Leukocytosis 01/17/2022   Migraines    Nodule of left lung 11/21/2020   Non-small cell lung cancer, left (HCC) 01/15/2021   Tobacco use 12/07/2020    Past Surgical History:  Procedure Laterality Date   ABDOMINAL HYSTERECTOMY  1989   unsure of vaginal  or abdominal   BRONCHIAL BIOPSY  01/15/2021   Procedure: BRONCHIAL BIOPSIES;  Surgeon: Leslye Peer, MD;  Location: [facility] ENDOSCOPY;  Service: Pulmonary;;   BRONCHIAL BRUSHINGS  01/15/2021   Procedure: BRONCHIAL BRUSHINGS;  Surgeon: Leslye Peer, MD;  Location[facility] ENDOSCOPY;  Service: Pulmonary;;   BRONCHIAL NEEDLE ASPIRATION BIOPSY  01/15/2021   Procedure: BRONCHIAL NEEDLE ASPIRATION BIOPSIES;  Surgeon: Leslye Peer, MD;  Location: [facility] ENDOSCOPY;  Service: Pulmonary;;   COLONOSCOPY  2022   FIDUCIAL MARKER PLACEMENT  01/15/2021   Procedure: FIDUCIAL MARKER PLACEMENT;  Surgeon: Leslye Peer, MD;  Location: [facility] ENDOSCOPY;  Service: Pulmonary;;   ovaries removed  1986   surgery to remove scar tissue     from colon/bladder in patient's late 30's   TUBAL LIGATION  1986   UPPER GI ENDOSCOPY     years ago in patient's late 86s   VIDEO BRONCHOSCOPY WITH RADIAL ENDOBRONCHIAL ULTRASOUND  01/15/2021   Procedure: VIDEO BRONCHOSCOPY WITH RADIAL ENDOBRONCHIAL ULTRASOUND;  Surgeon: Leslye Peer, MD;  Location: [facility] ENDOSCOPY;  Service: Pulmonary;;    Family History  Problem Relation Age of Onset   Pancreatic cancer Maternal Aunt 50   Brain cancer Maternal Uncle    Stomach cancer  Maternal Uncle        dx. >50   Prostate cancer Half-Brother        passed away at 69   Prostate cancer Half-Brother     Social History:  reports that she quit smoking about 16 months ago. Her smoking use included cigarettes. She started smoking about 61 years ago. She has a 30.1 pack-year smoking history. She has never used smokeless tobacco. She reports current alcohol use of about 4.0 standard drinks of alcohol per week. She reports current drug use. Frequency: 3.00 times per week. Drug: Marijuana.The patient is alone today.  Allergies:  Allergies  Allergen Reactions   Codeine Rash   Demerol [Meperidine Hcl] Other (See Comments)    coma   Meperidine Other (See Comments)    Several days will pass without patient being aware   Other Dermatitis    Stainless steel / skin weeps   Silver Dermatitis    Sterling silver skin weeps   Gold Bond [Menthol-Zinc Oxide] Dermatitis    Skin weeps   Neosporin [Bacitracin-Polymyxin B] Dermatitis    Skin weeps   Wound Dressing Adhesive Other (See Comments)    CAUSES SKIN TEARS PER PATIENT. INSTEAD USE THE TEGADERM 45M WITH ADHESIVE FREE WINDOW OVER PORT.    Current Medications: Current Outpatient Medications  Medication Sig Dispense Refill   pantoprazole (PROTONIX) 40 MG tablet Take 1 tablet (40 mg total) by mouth daily. 60 tablet 2   acetaminophen (TYLENOL) 500 MG tablet Take by mouth.     albuterol (VENTOLIN HFA) 108 (90 Base) MCG/ACT inhaler Inhale 2 puffs into the lungs every 4 (four) hours as needed for wheezing or shortness of breath.     amitriptyline (ELAVIL) 10 MG tablet Take 10 mg by mouth at bedtime as needed for sleep.     aspirin 81 MG EC tablet Take 81 mg by mouth daily.     atorvastatin (LIPITOR) 40 MG tablet Take 40 mg by mouth daily.     Biotin 5000 MCG CAPS Take 5,000 mcg by mouth daily.     Collagen-Vitamin C (SUPER COLLAGEN PLUS VITAMIN C) 1000-10 MG TABS Take by mouth.     DAYVIGO 5 MG TABS Take 1 tablet by mouth at bedtime.  fluticasone (FLONASE) 50 MCG/ACT nasal spray Place into both nostrils.     HYDROcodone-acetaminophen (NORCO/VICODIN) 5-325 MG tablet Take 1 tablet by mouth every 6 (six) hours as needed for moderate pain or severe pain. 30 tablet 0   levothyroxine (SYNTHROID) 50 MCG tablet Take 1 tablet (50 mcg total) by mouth daily before breakfast.     lidocaine-prilocaine (EMLA) cream Apply 1 Application topically as needed. 30 g 0   meloxicam (MOBIC) 15 MG tablet Take 15 mg by mouth daily.     Menaquinone-7 (VITAMIN K2) 100 MCG CAPS Take by mouth.     Multiple Vitamins-Minerals (CENTRUM MINIS WOMEN 50+ PO) Take by mouth.     ondansetron (ZOFRAN) 4 MG tablet Take 1 tablet (4 mg total) by mouth every 4 (four) hours as needed for nausea. 90 tablet 3   potassium chloride SA (KLOR-CON M) 20 MEQ tablet Take 3 tablets (60 mEq total) by mouth 2 (two) times daily. Taking 2 tablets in the morning and 2 tablets in the afternoon (Patient taking differently: Take 60 mEq by mouth 2 (two) times daily. Patient voiced only only taking 2 tabs daily now) 180 tablet 3   prochlorperazine (COMPAZINE) 10 MG tablet TAKE 1 TABLET(10 MG) BY MOUTH EVERY 6 HOURS AS NEEDED FOR NAUSEA OR VOMITING 360 tablet 0   traZODone (DESYREL) 50 MG tablet Take 25 mg by mouth at bedtime as needed.     vitamin E 180 MG (400 UNITS) capsule Take 400 Units by mouth daily.     No current facility-administered medications for this visit.

## 2022-09-26 NOTE — Assessment & Plan Note (Addendum)
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  She was treated with left lower lobectomy in [place].  Her doctor there recommended chemotherapy with immunotherapy, but she refused chemotherapy.  PD-L1 was positive.  She was only willing to take the immunotherapy for 1 year. She started pembrolizumab in August 2023.  We saw her to re-establish care in October 2023, when she returned to [place].  We have continued pembrolizumab every 21 days. Routine CT imaging has not shown any evidence of recurrence.  CT chest on August 27 was once again negative for recurrence.  Surgical changes were seen in the left lower lobe.  There were stable scattered small bilateral pulmonary nodules felt to be likely benign emphysema and aortic atherosclerosis were seen.  She has leukocytosis and mild hyperbilirubinemia, both of which have been intermittent.  She has no obvious infection.  We will continue to monitor these.  She will proceed with 17th and final cycle of adjuvant pembrolizumab next week.  I will plan to see her back in 3 weeks for continued supportive care.

## 2022-09-26 NOTE — Assessment & Plan Note (Signed)
Chronic heartburn despite increasing omeprazole 40 mg twice daily.  I will place her on pantoprazole 40 mg twice daily.

## 2022-09-26 NOTE — Telephone Encounter (Signed)
-----   Message from Sydney Hunt sent at 09/25/2022  9:01 PM EDT ----- Regarding: CT Her mammo from 8/13 is clear.  CT from 8/27 has not been read yet.  Looks grossly okay to me

## 2022-09-26 NOTE — Assessment & Plan Note (Signed)
Intermittent mild hyperbilirubinemia of uncertain etiology.  Will continue to monitor this.

## 2022-09-27 ENCOUNTER — Encounter: Payer: Self-pay | Admitting: Oncology

## 2022-09-27 MED FILL — Pembrolizumab IV Soln 100 MG/4ML (25 MG/ML): INTRAVENOUS | Qty: 8 | Status: AC

## 2022-10-01 ENCOUNTER — Ambulatory Visit: Payer: Medicare HMO

## 2022-10-01 ENCOUNTER — Inpatient Hospital Stay: Payer: Medicare HMO | Attending: Hematology and Oncology

## 2022-10-01 VITALS — BP 135/78 | HR 74 | Temp 97.9°F | Resp 18 | Ht 59.0 in | Wt 108.2 lb

## 2022-10-01 DIAGNOSIS — I10 Essential (primary) hypertension: Secondary | ICD-10-CM | POA: Diagnosis not present

## 2022-10-01 DIAGNOSIS — Z79899 Other long term (current) drug therapy: Secondary | ICD-10-CM | POA: Diagnosis not present

## 2022-10-01 DIAGNOSIS — M2550 Pain in unspecified joint: Secondary | ICD-10-CM | POA: Diagnosis not present

## 2022-10-01 DIAGNOSIS — E032 Hypothyroidism due to medicaments and other exogenous substances: Secondary | ICD-10-CM | POA: Insufficient documentation

## 2022-10-01 DIAGNOSIS — J449 Chronic obstructive pulmonary disease, unspecified: Secondary | ICD-10-CM | POA: Diagnosis not present

## 2022-10-01 DIAGNOSIS — R5383 Other fatigue: Secondary | ICD-10-CM | POA: Diagnosis not present

## 2022-10-01 DIAGNOSIS — Z90710 Acquired absence of both cervix and uterus: Secondary | ICD-10-CM | POA: Diagnosis not present

## 2022-10-01 DIAGNOSIS — Z808 Family history of malignant neoplasm of other organs or systems: Secondary | ICD-10-CM | POA: Insufficient documentation

## 2022-10-01 DIAGNOSIS — Z5112 Encounter for antineoplastic immunotherapy: Secondary | ICD-10-CM | POA: Diagnosis present

## 2022-10-01 DIAGNOSIS — Z885 Allergy status to narcotic agent status: Secondary | ICD-10-CM | POA: Diagnosis not present

## 2022-10-01 DIAGNOSIS — Z79890 Hormone replacement therapy (postmenopausal): Secondary | ICD-10-CM | POA: Insufficient documentation

## 2022-10-01 DIAGNOSIS — Z90722 Acquired absence of ovaries, bilateral: Secondary | ICD-10-CM | POA: Diagnosis not present

## 2022-10-01 DIAGNOSIS — M549 Dorsalgia, unspecified: Secondary | ICD-10-CM | POA: Insufficient documentation

## 2022-10-01 DIAGNOSIS — D72829 Elevated white blood cell count, unspecified: Secondary | ICD-10-CM | POA: Insufficient documentation

## 2022-10-01 DIAGNOSIS — Z8042 Family history of malignant neoplasm of prostate: Secondary | ICD-10-CM | POA: Diagnosis not present

## 2022-10-01 DIAGNOSIS — C3492 Malignant neoplasm of unspecified part of left bronchus or lung: Secondary | ICD-10-CM

## 2022-10-01 DIAGNOSIS — K219 Gastro-esophageal reflux disease without esophagitis: Secondary | ICD-10-CM | POA: Diagnosis not present

## 2022-10-01 DIAGNOSIS — E876 Hypokalemia: Secondary | ICD-10-CM | POA: Diagnosis not present

## 2022-10-01 DIAGNOSIS — Z800 Family history of malignant neoplasm of digestive organs: Secondary | ICD-10-CM | POA: Diagnosis not present

## 2022-10-01 DIAGNOSIS — F1290 Cannabis use, unspecified, uncomplicated: Secondary | ICD-10-CM | POA: Insufficient documentation

## 2022-10-01 DIAGNOSIS — Z79620 Long term (current) use of immunosuppressive biologic: Secondary | ICD-10-CM | POA: Insufficient documentation

## 2022-10-01 DIAGNOSIS — Z87891 Personal history of nicotine dependence: Secondary | ICD-10-CM | POA: Insufficient documentation

## 2022-10-01 DIAGNOSIS — C3432 Malignant neoplasm of lower lobe, left bronchus or lung: Secondary | ICD-10-CM | POA: Diagnosis present

## 2022-10-01 MED ORDER — SODIUM CHLORIDE 0.9 % IV SOLN: Freq: Once | INTRAVENOUS | Status: AC

## 2022-10-01 MED ORDER — SODIUM CHLORIDE 0.9 % IV SOLN
200.0000 mg | Freq: Once | INTRAVENOUS | Status: AC
  Administered 2022-10-01: 200 mg via INTRAVENOUS
  Filled 2022-10-01: qty 8

## 2022-10-01 MED ORDER — HEPARIN SOD (PORK) LOCK FLUSH 100 UNIT/ML IV SOLN
500.0000 [IU] | Freq: Once | INTRAVENOUS | Status: AC | PRN
  Administered 2022-10-01: 500 [IU]

## 2022-10-01 MED ORDER — SODIUM CHLORIDE 0.9% FLUSH
10.0000 mL | INTRAVENOUS | Status: DC | PRN
  Administered 2022-10-01: 10 mL

## 2022-10-01 NOTE — Patient Instructions (Signed)

## 2022-10-07 ENCOUNTER — Other Ambulatory Visit: Payer: Self-pay | Admitting: Oncology

## 2022-10-07 DIAGNOSIS — E032 Hypothyroidism due to medicaments and other exogenous substances: Secondary | ICD-10-CM

## 2022-10-16 ENCOUNTER — Other Ambulatory Visit: Payer: Self-pay | Admitting: Oncology

## 2022-10-17 ENCOUNTER — Inpatient Hospital Stay (INDEPENDENT_AMBULATORY_CARE_PROVIDER_SITE_OTHER): Payer: Medicare HMO | Admitting: Hematology and Oncology

## 2022-10-17 ENCOUNTER — Encounter: Payer: Self-pay | Admitting: Hematology and Oncology

## 2022-10-17 ENCOUNTER — Inpatient Hospital Stay: Payer: Medicare HMO

## 2022-10-17 VITALS — BP 131/88 | HR 76 | Temp 97.9°F | Resp 20 | Ht 59.0 in | Wt 104.8 lb

## 2022-10-17 DIAGNOSIS — C3492 Malignant neoplasm of unspecified part of left bronchus or lung: Secondary | ICD-10-CM | POA: Diagnosis not present

## 2022-10-17 DIAGNOSIS — E876 Hypokalemia: Secondary | ICD-10-CM

## 2022-10-17 DIAGNOSIS — Z5112 Encounter for antineoplastic immunotherapy: Secondary | ICD-10-CM | POA: Diagnosis not present

## 2022-10-17 DIAGNOSIS — E032 Hypothyroidism due to medicaments and other exogenous substances: Secondary | ICD-10-CM | POA: Diagnosis not present

## 2022-10-17 LAB — CBC WITH DIFFERENTIAL (CANCER CENTER ONLY)
Abs Immature Granulocytes: 0.02 10*3/uL (ref 0.00–0.07)
Basophils Absolute: 0.2 10*3/uL — ABNORMAL HIGH (ref 0.0–0.1)
Basophils Relative: 2 %
Eosinophils Absolute: 0.7 10*3/uL — ABNORMAL HIGH (ref 0.0–0.5)
Eosinophils Relative: 6 %
HCT: 43.2 % (ref 36.0–46.0)
Hemoglobin: 14.1 g/dL (ref 12.0–15.0)
Immature Granulocytes: 0 %
Lymphocytes Relative: 30 %
Lymphs Abs: 3.2 10*3/uL (ref 0.7–4.0)
MCH: 32.3 pg (ref 26.0–34.0)
MCHC: 32.6 g/dL (ref 30.0–36.0)
MCV: 99.1 fL (ref 80.0–100.0)
Monocytes Absolute: 0.8 10*3/uL (ref 0.1–1.0)
Monocytes Relative: 8 %
Neutro Abs: 5.7 10*3/uL (ref 1.7–7.7)
Neutrophils Relative %: 54 %
Platelet Count: 290 10*3/uL (ref 150–400)
RBC: 4.36 MIL/uL (ref 3.87–5.11)
RDW: 14.1 % (ref 11.5–15.5)
WBC Count: 10.6 10*3/uL — ABNORMAL HIGH (ref 4.0–10.5)
nRBC: 0 % (ref 0.0–0.2)

## 2022-10-17 LAB — CMP (CANCER CENTER ONLY)
ALT: 13 U/L (ref 0–44)
AST: 18 U/L (ref 15–41)
Albumin: 4.1 g/dL (ref 3.5–5.0)
Alkaline Phosphatase: 58 U/L (ref 38–126)
Anion gap: 11 (ref 5–15)
BUN: 13 mg/dL (ref 8–23)
CO2: 27 mmol/L (ref 22–32)
Calcium: 9.6 mg/dL (ref 8.9–10.3)
Chloride: 102 mmol/L (ref 98–111)
Creatinine: 0.69 mg/dL (ref 0.44–1.00)
GFR, Estimated: >60 mL/min (ref >60)
Glucose, Bld: 118 mg/dL — ABNORMAL HIGH (ref 70–99)
Potassium: 4.2 mmol/L (ref 3.5–5.1)
Sodium: 140 mmol/L (ref 135–145)
Total Bilirubin: 1.2 mg/dL (ref 0.3–1.2)
Total Protein: 7.3 g/dL (ref 6.5–8.1)

## 2022-10-17 LAB — TSH: TSH: 47.268 u[IU]/mL — ABNORMAL HIGH (ref 0.350–4.500)

## 2022-10-17 NOTE — Progress Notes (Signed)
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  She was treated with left lower lobectomy in [place].  Her doctor there recommended chemotherapy with immunotherapy, but she refused chemotherapy.  PD-L1 was positive.  She was only willing to take the immunotherapy for 1 year. She started pembrolizumab in August 2023.  We saw her to re-establish care in October 2023, when she returned to [place].  We have continued pembrolizumab every 21 days. Routine CT imaging has not shown any evidence of recurrence.  CT chest on August 27 was once again negative for recurrence.  Surgical changes were seen in the left lower lobe.  There were stable scattered small bilateral pulmonary nodules felt to be likely benign emphysema and aortic atherosclerosis were seen.  She has leukocytosis and mild hyperbilirubinemia, both of which have been intermittent.  She has no obvious infection.  We will continue to monitor these.  She completed 17 cycles of adjuvant pembrolizumab in August.  I will plan to repeat imaging in 3 months.   Hypothyroidism due to medication She had been on levothyroxine 75 mcg daily since May.  TSH was suppressed in July, so levothyroxine was decreased to 50 mcg daily.  Her TSH today is over 47, so I will have her increase levothyroxine to 75 mcg daily.  I will plan to see her back in 6 weeks with a TSH.  Hypokalemia This resolved with the patient resuming her potassium supplement.  She will continue potassium chloride 20 mEq 2 tabs twice daily.  I will repeat a continue BMP in 6 weeks.   The patient understands the plans discussed today and is in agreement with them.  She knows to contact our office if she develops  concerns prior to her next appointment.   I provided 30 minutes of face-to-face time during this encounter and > 50% was spent counseling as documented under my assessment and plan.    [name], PA-C  [facility] AT [facility] [address] [place] [address] Dept: [phone] Dept Fax: [phone]   Orders Placed This Encounter  Procedures   CT Chest W Contrast    Standing Status:   Future    Standing Expiration Date:   10/17/2023    Scheduling Instructions:     Schedule at [facility]    Order Specific Question:   If indicated for the ordered procedure, I authorize the administration of contrast media per Radiology protocol    Answer:   Yes    Order Specific Question:   Does the patient have a contrast media/X-ray dye allergy?    Answer:   No    Order Specific Question:   Preferred imaging location?    Answer:   External   CBC with Differential (Cancer Center Only)    Standing Status:   Future    Standing Expiration Date:   10/17/2023   CMP (Cancer Center only)    Standing Status:   Future    Standing Expiration Date:   10/17/2023   CEA    Standing Status:   Future    Standing Expiration Date:   10/17/2023   TSH    Standing Status:   Future  Standing Expiration Date:   10/17/2023   TSH    Standing Status:   Future    Standing Expiration Date:   10/17/2023   T4    Standing Status:   Future    Standing Expiration Date:   10/17/2023   CBC with Differential (Cancer Center Only)    Standing Status:   Future    Standing Expiration Date:   10/17/2023   CMP (Cancer Center only)    Standing Status:   Future    Standing Expiration Date:   10/17/2023      CHIEF COMPLAINT:  CC: Stage IA2 non-small cell lung cancer  Current Treatment: Surveillance  HISTORY OF PRESENT ILLNESS:   Oncology History  Non-small cell lung cancer, left (HCC)  01/15/2021 Initial Diagnosis   Non-small cell lung cancer, left (HCC)    02/12/2021 Cancer Staging   Staging form: Lung, AJCC 8th Edition - Clinical stage from 02/12/2021: Stage IA2 (cT1b, cN0, cM0) - Signed by Dellia Beckwith, MD on 02/14/2021 Histopathologic type: Adenocarcinoma, NOS Stage prefix: Initial diagnosis Histologic grade (G): GX Histologic grading system: 4 grade system Laterality: Left Tumor size (mm): 19 Lymph-vascular invasion (LVI): LVI not present (absent)/not identified Diagnostic confirmation: Positive histology Specimen type: Bronchial Biopsy Staged by: Managing physician Stage used in treatment planning: Yes National guidelines used in treatment planning: Yes Type of national guideline used in treatment planning: NCCN Staging comments: Rec surgical resection   05/11/2021 Cancer Staging   Staging form: Lung, AJCC 8th Edition - Pathologic stage from 05/11/2021: Stage IIB (ypT1c, pN1, cM0) - Signed by Dellia Beckwith, MD on 11/12/2021 Histopathologic type: Adenocarcinoma, NOS Stage prefix: Post-therapy Histologic grade (G): G2 Histologic grading system: 4 grade system Residual tumor (R): R0 - Sydney Hunt Laterality: Left Tumor size (mm): 25 Lymph-vascular invasion (LVI): LVI not present (absent)/not identified Diagnostic confirmation: Positive histology PLUS positive immunophenotyping and/or positive genetic studies Specimen type: Excision Staged by: Managing physician Type of lung cancer: Surgically resected non-small cell lung cancer ECOG performance status: Grade 1 Perineural invasion (PNI): Absent Pleural/elastic layer invasion: PL0 Pleural lavage cytology: Unknown Weight loss: Absent Adequacy of mediastinal dissection: Adequate Radiotherapy dose: No Adjuvant radiation: No Adjuvant chemotherapy: Yes Stage used in treatment planning: Yes National guidelines used in treatment planning: Yes Type of national guideline used in treatment planning: NCCN Staging comments: Adjuvant pembrolizumab, PDL 1 90%   10/04/2021 - 10/01/2022  Chemotherapy   Patient is on Treatment Plan : LUNG NSCLC Pembrolizumab Adj q21d x 18 cycles         INTERVAL HISTORY:  Maleea is here today for repeat clinical assessment.  She reports fatigue relieved with rest.  She reports occasional anxiety she reports chronic knee hip and back pain, which is stable.  She denies fevers or chills.Her appetite is good. Her weight has been stable.  States she quit smoking cigarettes 6 weeks ago, but still smokes marijuana.  REVIEW OF SYSTEMS:  Review of Systems  Constitutional:  Positive for fatigue. Negative for appetite change, chills, fever and unexpected weight change.  HENT:   Negative for lump/mass, mouth sores and sore throat.   Respiratory:  Negative for cough and shortness of breath.   Cardiovascular:  Negative for chest pain and leg swelling.  Gastrointestinal:  Negative for abdominal pain, constipation, diarrhea, nausea and vomiting.  Endocrine: Negative for hot flashes.  Genitourinary:  Negative for difficulty urinating, dysuria, frequency and hematuria.   Musculoskeletal:  Positive for arthralgias (Chronic, stable). Negative for back pain and myalgias.  Skin:  Negative for rash.  Neurological:  Negative for dizziness and headaches.  Hematological:  Negative for adenopathy. Does not bruise/bleed easily.  Psychiatric/Behavioral:  Negative for depression and sleep disturbance. The patient is nervous/anxious.      VITALS:  Blood pressure 131/88, pulse 76, temperature 97.9 F (36.6 C), temperature source Oral, resp. rate 20, height 4\' 11"  (1.499 m), weight 104 lb 12.8 oz (47.5 kg), SpO2 98%.  Wt Readings from Last 3 Encounters:  10/17/22 104 lb 12.8 oz (47.5 kg)  10/01/22 108 lb 4 oz (49.1 kg)  09/26/22 105 lb 3.2 oz (47.7 kg)    Body mass index is 21.17 kg/m.  Performance status (ECOG): 1 - Symptomatic but completely ambulatory  PHYSICAL EXAM:  Physical Exam Vitals and nursing note reviewed.  Constitutional:      General: She is not  in acute distress.    Appearance: Normal appearance.  HENT:     Head: Normocephalic and atraumatic.     Mouth/Throat:     Mouth: Mucous membranes are moist.     Pharynx: Oropharynx is clear. No oropharyngeal exudate or posterior oropharyngeal erythema.  Eyes:     General: No scleral icterus.    Extraocular Movements: Extraocular movements intact.     Conjunctiva/sclera: Conjunctivae normal.     Pupils: Pupils are equal, round, and reactive to light.  Cardiovascular:     Rate and Rhythm: Normal rate and regular rhythm.     Heart sounds: Normal heart sounds. No murmur heard.    No friction rub. No gallop.  Pulmonary:     Effort: Pulmonary effort is normal.     Breath sounds: Normal breath sounds. No wheezing, rhonchi or rales.  Abdominal:     General: There is no distension.     Palpations: Abdomen is soft. There is no hepatomegaly, splenomegaly or mass.     Tenderness: There is no abdominal tenderness.  Musculoskeletal:        General: Normal range of motion.     Cervical back: Normal range of motion and neck supple. No tenderness.     Right lower leg: No edema.     Left lower leg: No edema.  Lymphadenopathy:     Cervical: No cervical adenopathy.     Upper Body:     Right upper body: No supraclavicular or axillary adenopathy.     Left upper body: No supraclavicular or axillary adenopathy.     Lower Body: No right inguinal adenopathy. No left inguinal adenopathy.  Skin:    General: Skin is warm and dry.     Coloration: Skin is not jaundiced.     Findings: No rash.  Neurological:     Mental Status: She is alert and oriented to person, place, and time.     Cranial Nerves: No cranial nerve deficit.  Psychiatric:        Mood and Affect: Mood normal.        Behavior: Behavior normal.        Thought Content: Thought content normal.     LABS:      Latest Ref Rng & Units 10/17/2022   10:50 AM 09/24/2022   12:00 AM 09/05/2022   12:00 AM  CBC  WBC 4.0 - 10.5 K/uL 10.6  12.4      11.0      Hemoglobin 12.0 - 15.0 g/dL 57.8  46.9     62.9      Hematocrit 36.0 - 46.0 % 43.2  41  40      Platelets 150 - 400 K/uL 290  292     251         This result is from an external source.      Latest Ref Rng & Units 10/17/2022   10:50 AM 09/24/2022   12:00 AM 09/05/2022   10:08 AM  CMP  Glucose 70 - 99 mg/dL 161   096   BUN 8 - 23 mg/dL 13  13     11    Creatinine 0.44 - 1.00 mg/dL 0.45  0.7     4.09   Sodium 135 - 145 mmol/L 140  138     139   Potassium 3.5 - 5.1 mmol/L 4.2  3.0     3.9   Chloride 98 - 111 mmol/L 102  97     106   CO2 22 - 32 mmol/L 27  28     27    Calcium 8.9 - 10.3 mg/dL 9.6  81.1     9.5   Total Protein 6.5 - 8.1 g/dL 7.3   6.9   Total Bilirubin 0.3 - 1.2 mg/dL 1.2   1.1   Alkaline Phos 38 - 126 U/L 58  71     58   AST 15 - 41 U/L 18  31     18    ALT 0 - 44 U/L 13  16     11       This result is from an external source.     Lab Results  Component Value Date   CEA1 2.8 11/08/2021   /  CEA  Date Value Ref Range Status  11/08/2021 2.8 0.0 - 4.7 ng/mL Final    Comment:    (NOTE)                             Nonsmokers          <3.9                             Smokers             <5.6 [employer] Electrochemiluminescence Immunoassay (ECLIA) Values obtained with different assay methods or kits cannot be used interchangeably.  Results cannot be interpreted as absolute evidence of the presence or absence of malignant disease. Performed At: [facility] [address] [place], [place] [id number] Jolene Schimke MD OZ:[id number]    No results found for: "PSA1" No results found for: [phone]" No results found for: "CAN125"  No results found for: "TOTALPROTELP", "ALBUMINELP", "A1GS", "A2GS", "BETS", "BETA2SER", "GAMS", "MSPIKE", "SPEI" No results found for: "TIBC", "FERRITIN", "IRONPCTSAT" No results found for: "LDH"  STUDIES:  No results found.    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 Report date is 12/12/2020.     The Multi-Cancer + RNA Panel offered by [facility] includes sequencing and/or deletion/duplication analysis of the following 84 genes:  AIP*, ALK, APC*, ATM*, AXIN2*, BAP1*, BARD1*, BLM*, BMPR1A*, BRCA1*,  BRCA2*, BRIP1*, CASR, CDC73*, CDH1*, CDK4, CDKN1B*, CDKN1C*, CDKN2A, CEBPA, CHEK2*, CTNNA1*, DICER1*, DIS3L2*, EGFR, EPCAM, FH   GERD (gastroesophageal reflux disease)    Heartburn 12/27/2021   Hormone disorder 11/20/2020   Hyperbilirubinemia 09/26/2022   Hypertension    Hypokalemia 12/27/2021   Leukocytosis 01/17/2022   Migraines    Nodule of left lung 11/21/2020   Non-small cell lung cancer, left (HCC) 01/15/2021   Tobacco use 12/07/2020    Past Surgical History:  Procedure Laterality Date   ABDOMINAL HYSTERECTOMY  1989   unsure of vaginal or abdominal   BRONCHIAL BIOPSY  01/15/2021   Procedure: BRONCHIAL BIOPSIES;  Surgeon: Leslye Peer, MD;  Location: [facility] ENDOSCOPY;  Service: Pulmonary;;   BRONCHIAL BRUSHINGS  01/15/2021   Procedure: BRONCHIAL BRUSHINGS;  Surgeon: Leslye Peer, MD;  Location: [facility] ENDOSCOPY;  Service: Pulmonary;;   BRONCHIAL NEEDLE ASPIRATION BIOPSY  01/15/2021   Procedure: BRONCHIAL NEEDLE ASPIRATION BIOPSIES;  Surgeon: Leslye Peer, MD;  Location: [facility] ENDOSCOPY;  Service: Pulmonary;;   COLONOSCOPY  2022   FIDUCIAL MARKER PLACEMENT  01/15/2021   Procedure: FIDUCIAL MARKER PLACEMENT;  Surgeon: Leslye Peer, MD;  Location: [facility] ENDOSCOPY;  Service: Pulmonary;;   ovaries removed  1986   surgery to remove scar tissue     from colon/bladder in patient's late 30's   TUBAL LIGATION  1986    UPPER GI ENDOSCOPY     years ago in patient's late 80s   VIDEO BRONCHOSCOPY WITH RADIAL ENDOBRONCHIAL ULTRASOUND  01/15/2021   Procedure: VIDEO BRONCHOSCOPY WITH RADIAL ENDOBRONCHIAL ULTRASOUND;  Surgeon: Leslye Peer, MD;  Location: [facility] ENDOSCOPY;  Service: Pulmonary;;    Family History  Problem Relation Age of Onset   Pancreatic cancer Maternal Aunt 50   Brain cancer Maternal Uncle    Stomach cancer Maternal Uncle        dx. >50   Prostate cancer Half-Brother        passed away at 66   Prostate cancer Half-Brother     Social History:  reports that she quit smoking about 17 months ago. Her smoking use included cigarettes. She started smoking about 61 years ago. She has a 30.1 pack-year smoking history. She has never used smokeless tobacco. She reports current alcohol use of about 4.0 standard drinks of alcohol per week. She reports current drug use. Frequency: 3.00 times per week. Drug: Marijuana.The patient is alone today.  Allergies:  Allergies  Allergen Reactions   Codeine Rash   Demerol [Meperidine Hcl] Other (See Comments)    coma   Meperidine Other (See Comments)    Several days will pass without patient being aware   Other Dermatitis    Stainless steel / skin weeps   Silver Dermatitis    Sterling silver skin weeps   Gold Bond [Menthol-Zinc Oxide] Dermatitis    Skin weeps   Neosporin [Bacitracin-Polymyxin B] Dermatitis    Skin weeps   Wound Dressing Adhesive Other (See Comments)    CAUSES SKIN TEARS PER PATIENT. INSTEAD USE THE TEGADERM 26M WITH ADHESIVE FREE WINDOW OVER PORT.    Current Medications: Current Outpatient Medications  Medication Sig Dispense Refill   acetaminophen (TYLENOL) 500 MG tablet Take by mouth.     albuterol (VENTOLIN HFA) 108 (90 Base) MCG/ACT inhaler Inhale 2 puffs into the lungs every 4 (four) hours as needed for wheezing or shortness of breath.     amitriptyline (ELAVIL) 10 MG tablet Take 10 mg by mouth at bedtime as needed for sleep.  aspirin 81 MG EC tablet Take 81 mg by mouth daily.     atorvastatin (LIPITOR) 40 MG tablet Take 40 mg by mouth daily.     Biotin 5000 MCG CAPS Take 5,000 mcg by mouth daily.     Collagen-Vitamin C (SUPER COLLAGEN PLUS VITAMIN C) 1000-10 MG TABS Take by mouth.     DAYVIGO 5 MG TABS Take 1 tablet by mouth at bedtime.     fluticasone (FLONASE) 50 MCG/ACT nasal spray Place into both nostrils.     HYDROcodone-acetaminophen (NORCO/VICODIN) 5-325 MG tablet Take 1 tablet by mouth every 6 (six) hours as needed for moderate pain or severe pain. 30 tablet 0   levothyroxine (SYNTHROID) 50 MCG tablet TAKE 1 TABLET(50 MCG) BY MOUTH DAILY BEFORE BREAKFAST 90 tablet 1   lidocaine-prilocaine (EMLA) cream Apply 1 Application topically as needed. 30 g 0   meloxicam (MOBIC) 15 MG tablet Take 15 mg by mouth daily.     Menaquinone-7 (VITAMIN K2) 100 MCG CAPS Take by mouth.     Multiple Vitamins-Minerals (CENTRUM MINIS WOMEN 50+ PO) Take by mouth.     ondansetron (ZOFRAN) 4 MG tablet Take 1 tablet (4 mg total) by mouth every 4 (four) hours as needed for nausea. 90 tablet 3   pantoprazole (PROTONIX) 40 MG tablet Take 1 tablet (40 mg total) by mouth daily. 60 tablet 2   potassium chloride SA (KLOR-CON M) 20 MEQ tablet Take 3 tablets (60 mEq total) by mouth 2 (two) times daily. Taking 2 tablets in the morning and 2 tablets in the afternoon (Patient taking differently: Take 60 mEq by mouth 2 (two) times daily. Patient voiced only only taking 2 tabs daily now) 180 tablet 3   prochlorperazine (COMPAZINE) 10 MG tablet TAKE 1 TABLET(10 MG) BY MOUTH EVERY 6 HOURS AS NEEDED FOR NAUSEA OR VOMITING 360 tablet 0   traZODone (DESYREL) 50 MG tablet Take 25 mg by mouth at bedtime as needed.     vitamin E 180 MG (400 UNITS) capsule Take 400 Units by mouth daily.     No current facility-administered medications for this visit.

## 2022-10-17 NOTE — Assessment & Plan Note (Signed)
She had been on levothyroxine 75 mcg daily since May.  TSH was suppressed in July, so levothyroxine was decreased to 50 mcg daily.  Her TSH today is over 47, so I will have her increase levothyroxine to 75 mcg daily.  I will plan to see her back in 6 weeks with a TSH.

## 2022-10-17 NOTE — Assessment & Plan Note (Signed)
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  She was treated with left lower lobectomy in [place].  Her doctor there recommended chemotherapy with immunotherapy, but she refused chemotherapy.  PD-L1 was positive.  She was only willing to take the immunotherapy for 1 year. She started pembrolizumab in August 2023.  We saw her to re-establish care in October 2023, when she returned to [place].  We have continued pembrolizumab every 21 days. Routine CT imaging has not shown any evidence of recurrence.  CT chest on August 27 was once again negative for recurrence.  Surgical changes were seen in the left lower lobe.  There were stable scattered small bilateral pulmonary nodules felt to be likely benign emphysema and aortic atherosclerosis were seen.  She has leukocytosis and mild hyperbilirubinemia, both of which have been intermittent.  She has no obvious infection.  We will continue to monitor these.  She completed 17 cycles of adjuvant pembrolizumab in August.  I will plan to repeat imaging in 3 months.

## 2022-10-17 NOTE — Assessment & Plan Note (Signed)
This resolved with the patient resuming her potassium supplement.  She will continue potassium chloride 20 mEq 2 tabs twice daily.  I will repeat a continue BMP in 6 weeks.

## 2022-10-18 LAB — CEA: CEA: 5.3 ng/mL — ABNORMAL HIGH (ref 0.0–4.7)

## 2022-10-22 ENCOUNTER — Encounter: Payer: Self-pay | Admitting: Oncology

## 2022-10-31 ENCOUNTER — Other Ambulatory Visit: Payer: Self-pay

## 2022-11-08 ENCOUNTER — Encounter: Payer: Self-pay | Admitting: Cardiology

## 2022-11-08 ENCOUNTER — Ambulatory Visit: Payer: Medicare HMO | Attending: Cardiology | Admitting: Cardiology

## 2022-11-08 VITALS — BP 110/64 | HR 70 | Ht 59.0 in | Wt 107.4 lb

## 2022-11-08 DIAGNOSIS — I10 Essential (primary) hypertension: Secondary | ICD-10-CM | POA: Diagnosis not present

## 2022-11-08 DIAGNOSIS — I7090 Unspecified atherosclerosis: Secondary | ICD-10-CM | POA: Diagnosis not present

## 2022-11-08 NOTE — Progress Notes (Signed)
Cardiology Office Note:    Date:  11/08/2022   ID:  Sydney Hunt, DOB [birth year], MRN [id number]  PCP:  Jerrye Bushy, FNP  Cardiologist:  Garwin Brothers, MD   Referring MD: Jerrye Bushy, FNP    ASSESSMENT:    1. Atherosclerotic vascular disease   2. Primary hypertension    PLAN:    In order of problems listed above:  Atherosclerotic vascular disease: Secondary prevention stressed with the patient.  Importance of compliance with diet medication stressed and she vocalized understanding.  She was advised to ambulate to the best of her ability and exercise efforts need to be sustained. Mixed dyslipidemia: On lipid-lowering medications followed by primary care.  Goal LDL must be less than 60. Ex-smoker: Promises never to go back to smoking.  Risks of smoking revisited. Patient will be seen in follow-up appointment in 6 months or earlier if the patient has any concerns.    Medication Adjustments/Labs and Tests Ordered: Current medicines are reviewed at length with the patient today.  Concerns regarding medicines are outlined above.  No orders of the defined types were placed in this encounter.  No orders of the defined types were placed in this encounter.    No chief complaint on file.    History of Present Illness:    Sydney Hunt is a 62 y.o. female.  Patient has past medical history of atherosclerotic vascular disease, mixed dyslipidemia, COPD.  She is an ex-smoker and quit 3 months ago.  She denies any chest pain orthopnea or PND.  She takes care of activities of daily living.  She is trying to be ambulating to the best of her ability.  She tells me that she is cancer free.  At the time of my evaluation, the patient is alert awake oriented and in no distress.  Past Medical History:  Diagnosis Date   Allergy    Arthritis    Atherosclerotic vascular disease 01/16/2022   Cardiac murmur 01/16/2022   Complication of anesthesia    slow to wake up   COPD (chronic  obstructive pulmonary disease) (HCC)    COPD, mild (HCC) 12/15/2020   DOE (dyspnea on exertion) 01/16/2022   Family history of pancreatic cancer 11/28/2020   Family history of prostate cancer 11/28/2020   Family history of stomach cancer 11/28/2020   Genetic testing 12/18/2020   [facility] Multi-Cancer Panel was Negative. Report date is 12/12/2020.     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   Leukocytosis 01/17/2022   Migraines    Nodule of left lung 11/21/2020   Non-small cell lung cancer, left (HCC) 01/15/2021   Port-A-Cath in place 09/05/2022   Tobacco use 12/07/2020    Past Surgical History:  Procedure Laterality Date   ABDOMINAL HYSTERECTOMY  1989   unsure of vaginal or abdominal   BRONCHIAL BIOPSY  01/15/2021   Procedure: BRONCHIAL BIOPSIES;  Surgeon: Leslye Peer, MD;  Location: [facility] ENDOSCOPY;  Service: Pulmonary;;   BRONCHIAL BRUSHINGS  01/15/2021   Procedure: BRONCHIAL BRUSHINGS;  Surgeon: Leslye Peer, MD;  Location:  [facility] ENDOSCOPY;  Service: Pulmonary;;   BRONCHIAL NEEDLE ASPIRATION BIOPSY  01/15/2021   Procedure: BRONCHIAL NEEDLE ASPIRATION BIOPSIES;  Surgeon: Leslye Peer, MD;  Location: [facility] ENDOSCOPY;  Service: Pulmonary;;   COLONOSCOPY  2022   FIDUCIAL MARKER PLACEMENT  01/15/2021   Procedure: FIDUCIAL MARKER PLACEMENT;  Surgeon: Leslye Peer, MD;  Location: [facility] ENDOSCOPY;  Service: Pulmonary;;   ovaries removed  1986   surgery to remove scar tissue     from  colon/bladder in patient's late 30's   TUBAL LIGATION  1986   UPPER GI ENDOSCOPY     years ago in patient's late 8s   VIDEO BRONCHOSCOPY WITH RADIAL ENDOBRONCHIAL ULTRASOUND  01/15/2021   Procedure: VIDEO BRONCHOSCOPY WITH RADIAL ENDOBRONCHIAL ULTRASOUND;  Surgeon: Leslye Peer, MD;  Location: [facility] ENDOSCOPY;  Service: Pulmonary;;    Current Medications: Current Meds  Medication Sig   acetaminophen (TYLENOL) 500 MG tablet Take 500 mg by mouth as needed for moderate pain or mild pain.   albuterol (VENTOLIN HFA) 108 (90 Base) MCG/ACT inhaler Inhale 2 puffs into the lungs every 4 (four) hours as needed for wheezing or shortness of breath.   amitriptyline (ELAVIL) 10 MG tablet Take 10 mg by mouth at bedtime as needed for sleep.   aspirin 81 MG EC tablet Take 81 mg by mouth daily.   atorvastatin (LIPITOR) 40 MG tablet Take 40 mg by mouth daily.   Biotin 5000 MCG CAPS Take 5,000 mcg by mouth daily.   Collagen-Vitamin C (SUPER COLLAGEN PLUS VITAMIN C) 1000-10 MG TABS Take 1 tablet by mouth daily.   fluticasone (FLONASE) 50 MCG/ACT nasal spray Place 1 spray into both nostrils daily.   HYDROcodone-acetaminophen (NORCO/VICODIN) 5-325 MG tablet Take 1 tablet by mouth every 6 (six) hours as needed for moderate pain or severe pain.   levothyroxine (SYNTHROID) 75 MCG tablet Take 75 mcg by mouth daily.   lidocaine-prilocaine (EMLA) cream Apply 1 Application topically as needed.   Menaquinone-7 (VITAMIN K2) 100 MCG CAPS Take 1 capsule by mouth daily.   Multiple Vitamins-Minerals (CENTRUM MINIS WOMEN 50+ PO) Take 1 tablet by mouth daily.   ondansetron (ZOFRAN) 4 MG tablet Take 1 tablet (4 mg total) by mouth every 4 (four) hours as needed for nausea.   pantoprazole (PROTONIX) 40 MG tablet Take 1 tablet (40 mg total) by mouth daily.   potassium chloride SA (KLOR-CON M) 20 MEQ tablet Take 3 tablets (60 mEq total) by mouth 2 (two) times daily. Taking 2 tablets in the morning and 2 tablets in the afternoon  (Patient taking differently: Take 60 mEq by mouth 2 (two) times daily. Patient voiced only only taking 2 tabs daily now)   prochlorperazine (COMPAZINE) 10 MG tablet TAKE 1 TABLET(10 MG) BY MOUTH EVERY 6 HOURS AS NEEDED FOR NAUSEA OR VOMITING   traZODone (DESYREL) 50 MG tablet Take 25 mg by mouth at bedtime as needed for sleep.   vitamin E 180 MG (400 UNITS) capsule Take 400 Units by mouth daily.   [DISCONTINUED] DAYVIGO 5 MG TABS Take 1 tablet by mouth at bedtime.   [DISCONTINUED] levothyroxine (SYNTHROID) 50 MCG tablet TAKE 1 TABLET(50 MCG) BY MOUTH DAILY BEFORE BREAKFAST   [DISCONTINUED] meloxicam (MOBIC) 15 MG tablet Take 15 mg by mouth daily.     Allergies:   Codeine, Demerol [meperidine hcl], Meperidine, Other, Silver, Gold bond [menthol-zinc oxide], Neosporin [bacitracin-polymyxin b], and Wound dressing adhesive   Social History   Socioeconomic History   Marital status: Widowed  Spouse name: Not on file   Number of children: 2   Years of education: Not on file   Highest education level: Not on file  Occupational History   Occupation: unemployed  Tobacco Use   Smoking status: Former    Current packs/day: 0.00    Average packs/day: 0.5 packs/day for 60.2 years (30.1 ttl pk-yrs)    Types: Cigarettes    Start date: 61    Quit date: 04/2021    Years since quitting: 1.5   Smokeless tobacco: Never   Tobacco comments:    10 cigarettes smoked daily [facility] 12/15/20    08/15/2022 pt states she quit 3 weeks ago. kd  Vaping Use   Vaping status: Never Used  Substance and Sexual Activity   Alcohol use: Yes    Alcohol/week: 4.0 standard drinks of alcohol    Types: 1 Glasses of wine, 3 Shots of liquor per week    Comment: occasional   Drug use: Yes    Frequency: 3.0 times per week    Types: Marijuana    Comment: current use   Sexual activity: Not Currently    Birth control/protection: Post-menopausal  Other Topics Concern   Not on file  Social History Narrative   Not on file    Social Determinants of Health   Financial Resource Strain: Not on file  Food Insecurity: Not on file  Transportation Needs: Not on file  Physical Activity: Not on file  Stress: Not on file  Social Connections: Not on file     Family History: The patient's family history includes Brain cancer in her maternal uncle; Pancreatic cancer (age of onset: 51) in her maternal aunt; Prostate cancer in her half-brother and half-brother; Stomach cancer in her maternal uncle.  ROS:   Please see the history of present illness.    All other systems reviewed and are negative.  EKGs/Labs/Other Studies Reviewed:    The following studies were reviewed today: I discussed my findings with the patient at length.   Recent Labs: 10/17/2022: ALT 13; BUN 13; Creatinine 0.69; Hemoglobin 14.1; Platelet Count 290; Potassium 4.2; Sodium 140; TSH 47.268  Recent Lipid Panel    Component Value Date/Time   CHOL 144 05/16/2022 0000   TRIG 134 05/16/2022 0000   HDL 42 05/16/2022 0000   LDLCALC 78 05/16/2022 0000    Physical Exam:    VS:  BP 110/64   Pulse 70   Ht 4\' 11"  (1.499 m)   Wt 107 lb 6.4 oz (48.7 kg)   LMP  (LMP Unknown)   SpO2 96%   BMI 21.69 kg/m     Wt Readings from Last 3 Encounters:  11/08/22 107 lb 6.4 oz (48.7 kg)  10/17/22 104 lb 12.8 oz (47.5 kg)  10/01/22 108 lb 4 oz (49.1 kg)     GEN: Patient is in no acute distress HEENT: Normal NECK: No JVD; No carotid bruits LYMPHATICS: No lymphadenopathy CARDIAC: Hear sounds regular, 2/6 systolic murmur at the apex. RESPIRATORY:  Clear to auscultation without rales, wheezing or rhonchi  ABDOMEN: Soft, non-tender, non-distended MUSCULOSKELETAL:  No edema; No deformity  SKIN: Warm and dry NEUROLOGIC:  Alert and oriented x 3 PSYCHIATRIC:  Normal affect   Signed, Garwin Brothers, MD  11/08/2022 3:54 PM    [facility]

## 2022-11-08 NOTE — Patient Instructions (Signed)
Medication Instructions:  Your physician recommends that you continue on your current medications as directed. Please refer to the Current Medication list given to you today.  *If you need a refill on your cardiac medications before your next appointment, please call your pharmacy*   Lab Work: None If you have labs (blood work) drawn today and your tests are completely normal, you will receive your results only by: MyChart Message (if you have MyChart) OR A paper copy in the mail If you have any lab test that is abnormal or we need to change your treatment, we will call you to review the results.   Testing/Procedures: None   Follow-Up: At [facility], you and your health needs are our priority.  As part of our continuing mission to provide you with exceptional heart care, we have created designated Provider Care Teams.  These Care Teams include your primary Cardiologist (physician) and Advanced Practice Providers (APPs -  Physician Assistants and Nurse Practitioners) who all work together to provide you with the care you need, when you need it.  We recommend signing up for the patient portal called "MyChart".  Sign up information is provided on this After Visit Summary.  MyChart is used to connect with patients for Virtual Visits (Telemedicine).  Patients are able to view lab/test results, encounter notes, upcoming appointments, etc.  Non-urgent messages can be sent to your provider as well.   To learn more about what you can do with MyChart, go to [email or web address].    Your next appointment:   9 month(s)  Provider:   Rajan Revankar, MD    Other Instructions None  

## 2022-11-28 ENCOUNTER — Inpatient Hospital Stay: Payer: Medicare HMO | Attending: Hematology and Oncology | Admitting: Hematology and Oncology

## 2022-11-28 ENCOUNTER — Inpatient Hospital Stay: Payer: Medicare HMO

## 2022-11-28 ENCOUNTER — Encounter: Payer: Self-pay | Admitting: Hematology and Oncology

## 2022-11-28 VITALS — BP 98/69 | HR 68 | Temp 98.2°F | Resp 18 | Ht 59.0 in | Wt 103.2 lb

## 2022-11-28 DIAGNOSIS — E876 Hypokalemia: Secondary | ICD-10-CM | POA: Insufficient documentation

## 2022-11-28 DIAGNOSIS — E032 Hypothyroidism due to medicaments and other exogenous substances: Secondary | ICD-10-CM

## 2022-11-28 DIAGNOSIS — Z79899 Other long term (current) drug therapy: Secondary | ICD-10-CM | POA: Insufficient documentation

## 2022-11-28 DIAGNOSIS — Z90722 Acquired absence of ovaries, bilateral: Secondary | ICD-10-CM | POA: Diagnosis not present

## 2022-11-28 DIAGNOSIS — Z885 Allergy status to narcotic agent status: Secondary | ICD-10-CM | POA: Diagnosis not present

## 2022-11-28 DIAGNOSIS — R17 Unspecified jaundice: Secondary | ICD-10-CM

## 2022-11-28 DIAGNOSIS — M25551 Pain in right hip: Secondary | ICD-10-CM | POA: Insufficient documentation

## 2022-11-28 DIAGNOSIS — K219 Gastro-esophageal reflux disease without esophagitis: Secondary | ICD-10-CM | POA: Insufficient documentation

## 2022-11-28 DIAGNOSIS — F1290 Cannabis use, unspecified, uncomplicated: Secondary | ICD-10-CM | POA: Insufficient documentation

## 2022-11-28 DIAGNOSIS — C3492 Malignant neoplasm of unspecified part of left bronchus or lung: Secondary | ICD-10-CM | POA: Diagnosis not present

## 2022-11-28 DIAGNOSIS — Z800 Family history of malignant neoplasm of digestive organs: Secondary | ICD-10-CM | POA: Diagnosis not present

## 2022-11-28 DIAGNOSIS — Z8042 Family history of malignant neoplasm of prostate: Secondary | ICD-10-CM | POA: Insufficient documentation

## 2022-11-28 DIAGNOSIS — C3432 Malignant neoplasm of lower lobe, left bronchus or lung: Secondary | ICD-10-CM | POA: Diagnosis present

## 2022-11-28 DIAGNOSIS — D72829 Elevated white blood cell count, unspecified: Secondary | ICD-10-CM | POA: Insufficient documentation

## 2022-11-28 DIAGNOSIS — Z808 Family history of malignant neoplasm of other organs or systems: Secondary | ICD-10-CM | POA: Diagnosis not present

## 2022-11-28 DIAGNOSIS — I10 Essential (primary) hypertension: Secondary | ICD-10-CM | POA: Insufficient documentation

## 2022-11-28 DIAGNOSIS — R110 Nausea: Secondary | ICD-10-CM | POA: Insufficient documentation

## 2022-11-28 DIAGNOSIS — Z90710 Acquired absence of both cervix and uterus: Secondary | ICD-10-CM | POA: Insufficient documentation

## 2022-11-28 DIAGNOSIS — J449 Chronic obstructive pulmonary disease, unspecified: Secondary | ICD-10-CM | POA: Diagnosis not present

## 2022-11-28 DIAGNOSIS — M25552 Pain in left hip: Secondary | ICD-10-CM | POA: Diagnosis not present

## 2022-11-28 DIAGNOSIS — Z87891 Personal history of nicotine dependence: Secondary | ICD-10-CM | POA: Insufficient documentation

## 2022-11-28 LAB — CBC WITH DIFFERENTIAL (CANCER CENTER ONLY)
Abs Immature Granulocytes: 0.02 10*3/uL (ref 0.00–0.07)
Basophils Absolute: 0.1 10*3/uL (ref 0.0–0.1)
Basophils Relative: 1 %
Eosinophils Absolute: 1 10*3/uL — ABNORMAL HIGH (ref 0.0–0.5)
Eosinophils Relative: 11 %
HCT: 40.1 % (ref 36.0–46.0)
Hemoglobin: 13.3 g/dL (ref 12.0–15.0)
Immature Granulocytes: 0 %
Lymphocytes Relative: 23 %
Lymphs Abs: 2 10*3/uL (ref 0.7–4.0)
MCH: 33.5 pg (ref 26.0–34.0)
MCHC: 33.2 g/dL (ref 30.0–36.0)
MCV: 101 fL — ABNORMAL HIGH (ref 80.0–100.0)
Monocytes Absolute: 0.9 10*3/uL (ref 0.1–1.0)
Monocytes Relative: 9 %
Neutro Abs: 5 10*3/uL (ref 1.7–7.7)
Neutrophils Relative %: 56 %
Platelet Count: 268 10*3/uL (ref 150–400)
RBC: 3.97 MIL/uL (ref 3.87–5.11)
RDW: 13.2 % (ref 11.5–15.5)
WBC Count: 9.1 10*3/uL (ref 4.0–10.5)
nRBC: 0 % (ref 0.0–0.2)

## 2022-11-28 LAB — DIRECT ANTIGLOBULIN TEST (NOT AT ARMC)
DAT, IgG: NEGATIVE
DAT, complement: NEGATIVE

## 2022-11-28 LAB — CMP (CANCER CENTER ONLY)
ALT: 11 U/L (ref 0–44)
AST: 15 U/L (ref 15–41)
Albumin: 4.1 g/dL (ref 3.5–5.0)
Alkaline Phosphatase: 58 U/L (ref 38–126)
Anion gap: 11 (ref 5–15)
BUN: 15 mg/dL (ref 8–23)
CO2: 27 mmol/L (ref 22–32)
Calcium: 9.7 mg/dL (ref 8.9–10.3)
Chloride: 102 mmol/L (ref 98–111)
Creatinine: 0.76 mg/dL (ref 0.44–1.00)
GFR, Estimated: >60 mL/min (ref >60)
Glucose, Bld: 111 mg/dL — ABNORMAL HIGH (ref 70–99)
Potassium: 3.4 mmol/L — ABNORMAL LOW (ref 3.5–5.1)
Sodium: 140 mmol/L (ref 135–145)
Total Bilirubin: 2.8 mg/dL — ABNORMAL HIGH (ref 0.3–1.2)
Total Protein: 7.3 g/dL (ref 6.5–8.1)

## 2022-11-28 LAB — TSH: TSH: 2.496 u[IU]/mL (ref 0.350–4.500)

## 2022-11-28 LAB — BILIRUBIN, DIRECT: Bilirubin, Direct: 0.3 mg/dL — ABNORMAL HIGH (ref 0.0–0.2)

## 2022-11-28 LAB — LACTATE DEHYDROGENASE: LDH: 130 U/L (ref 98–192)

## 2022-11-28 MED ORDER — MELOXICAM 7.5 MG PO TABS: 7.5000 mg | ORAL_TABLET | Freq: Every day | ORAL | 0 refills | Status: DC

## 2022-11-28 NOTE — Progress Notes (Signed)
[facility]  [address] 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  She was treated with left lower lobectomy in [place].  Her doctor there recommended chemotherapy with immunotherapy, but she refused chemotherapy.  PD-L1 was positive.  She was only willing to take the immunotherapy for 1 year. She started pembrolizumab in August 2023.  We saw her in October 2023, when she returned to [place] and continued pembrolizumab every 21 days. Routine CT imaging has not shown any evidence of recurrence.  CT chest on August 27 was once again negative for recurrence.  Surgical changes were seen in the left lower lobe.  There were stable scattered small bilateral pulmonary nodules felt to be likely benign emphysema and aortic atherosclerosis were seen.  She has leukocytosis and mild hyperbilirubinemia, both of which have been intermittent.  She has no obvious infection.  We will continue to monitor these.  She completed 17 cycles of adjuvant pembrolizumab in August.  We will see her back in about 2 months imaging with a CBC, comprehensive metabolic panel CEA, TSH and CT chest for repeat clinical assessment.   Hypothyroidism due to medication She had been on levothyroxine 75 mcg daily since May.  TSH was suppressed in July, so levothyroxine was decreased to 50 mcg daily.  Her TSH was elevated again in September, so I increased levothyroxine to 75 mcg daily.    Hypokalemia Resolved with the patient resuming her potassium supplement. She states she continues potassium chloride twice daily, but her potassium is borderline low today, so I encouraged her to continue the potassium as prescribed.  Hyperbilirubinemia Significant increase in her  bilirubin. Will check for hemolysis, although hemoglobin normal. Will obtain liver u/s. Asked her to discontinue acetaminophen and use meloxicam prescribed today for joint pain. Asked her to avoid alcohol. This rarely could be a late effect of the immunotherapy, so may need to consider steroid treatment.  I will plan to repeat a CMP early next week.    The patient understands the plans discussed today and is in agreement with them.  She knows to contact our office if she develops concerns prior to her next appointment.      Sydney Perl, PA-C  [facility] [address] [place] [address] Dept: [phone] Dept Fax: [phone]   Orders Placed This Encounter  Procedures   US Abdomen Limited    Standing Status:   Future    Standing Expiration Date:   11/28/2023    Scheduling Instructions:     RH    Order Specific Question:   Reason for Exam (SYMPTOM  OR DIAGNOSIS REQUIRED)    Answer:   elevated bilirubin    Order Specific Question:   Preferred imaging location?    Answer:   External   Bilirubin, direct    Standing Status:   Future    Number of Occurrences:   1    Standing Expiration Date:   11/28/2023   Lactate dehydrogenase    Standing Status:   Future    Number of Occurrences:   1    Standing Expiration Date:   11/28/2023   Haptoglobin    Standing Status:   Future  Number of Occurrences:   1    Standing Expiration Date:   11/28/2023   CMP (Cancer Center only)    Standing Status:   Future    Standing Expiration Date:   11/28/2023   Direct antiglobulin test (not at [facility])    Standing Status:   Future    Number of Occurrences:   1    Standing Expiration Date:   11/28/2023      CHIEF COMPLAINT:  CC: Stage IA2 adenocarcinoma of the lung  Current Treatment: Observation  HISTORY OF PRESENT ILLNESS:   Oncology History  Non-small cell lung cancer, left (HCC)  01/15/2021 Initial Diagnosis    Non-small cell lung cancer, left (HCC)   02/12/2021 Cancer Staging   Staging form: Lung, AJCC 8th Edition - Clinical stage from 02/12/2021: Stage IA2 (cT1b, cN0, cM0) - Signed by Dellia Beckwith, MD on 02/14/2021 Histopathologic type: Adenocarcinoma, NOS Stage prefix: Initial diagnosis Histologic grade (G): GX Histologic grading system: 4 grade system Laterality: Left Tumor size (mm): 19 Lymph-vascular invasion (LVI): LVI not present (absent)/not identified Diagnostic confirmation: Positive histology Specimen type: Bronchial Biopsy Staged by: Managing physician Stage used in treatment planning: Yes National guidelines used in treatment planning: Yes Type of national guideline used in treatment planning: NCCN Staging comments: Rec surgical resection   05/11/2021 Cancer Staging   Staging form: Lung, AJCC 8th Edition - Pathologic stage from 05/11/2021: Stage IIB (ypT1c, pN1, cM0) - Signed by Dellia Beckwith, MD on 11/12/2021 Histopathologic type: Adenocarcinoma, NOS Stage prefix: Post-therapy Histologic grade (G): G2 Histologic grading system: 4 grade system Residual tumor (R): R0 - None Laterality: Left Tumor size (mm): 25 Lymph-vascular invasion (LVI): LVI not present (absent)/not identified Diagnostic confirmation: Positive histology PLUS positive immunophenotyping and/or positive genetic studies Specimen type: Excision Staged by: Managing physician Type of lung cancer: Surgically resected non-small cell lung cancer ECOG performance status: Grade 1 Perineural invasion (PNI): Absent Pleural/elastic layer invasion: PL0 Pleural lavage cytology: Unknown Weight loss: Absent Adequacy of mediastinal dissection: Adequate Radiotherapy dose: No Adjuvant radiation: No Adjuvant chemotherapy: Yes Stage used in treatment planning: Yes National guidelines used in treatment planning: Yes Type of national guideline used in treatment planning: NCCN Staging comments: Adjuvant  pembrolizumab, PDL 1 90%   10/04/2021 - 10/01/2022 Chemotherapy   Patient is on Treatment Plan : LUNG NSCLC Pembrolizumab Adj q21d x 18 cycles         INTERVAL HISTORY:  [facility] is here today for repeat clinical assessment.  She completed 17 cycles of adjuvant pembrolizumab in August.  She is being seen to manage hypothyroidism due to immunotherapy, as well as hypokalemia.  She reports clear nasal drainage and a dry cough, which she attributes to allergies.  She denies sore throat.  She denies shortness of breath or chest pain.  She denies right upper quadrant pain. She is states she is having less nausea with eating.  She denies difficulty swallowing.  She denies fevers or chills. Her appetite is good. Her weight has decreased 1-2 pounds over last 6 weeks .  Her last weight in our office was 104 pounds 12.8 ounces.  She is working again Clinical research associate.  She is having more joint pain, especially of the knees and hips, since starting working again.  She states acetaminophen is not effective, ibuprofen and Aleve work better. She request a refill of her hydrocodone/APAP.  I do not recommend that for joint pain.  She has previously been on meloxicam 7.5 mg daily, but missed an appointment  her PCP this month, so could not get a refill.  She is seeing Ms. Poe next month.  I will send her in a 30-day supply of meloxicam 7.5 mg daily.  She continues to drink about 3 drinks a week.  REVIEW OF SYSTEMS:  Review of Systems  Constitutional:  Negative for appetite change, chills, fatigue, fever and unexpected weight change.  HENT:   Negative for lump/mass, mouth sores and sore throat.   Respiratory:  Positive for cough. Negative for hemoptysis and shortness of breath.   Cardiovascular:  Negative for chest pain and leg swelling.  Gastrointestinal:  Negative for abdominal pain, constipation, diarrhea, nausea and vomiting.  Genitourinary:  Negative for difficulty urinating, dysuria, frequency and hematuria.    Musculoskeletal:  Positive for arthralgias. Negative for back pain and myalgias.  Skin:  Negative for itching and rash.  Neurological:  Negative for dizziness and headaches.  Hematological:  Negative for adenopathy. Does not bruise/bleed easily.  Psychiatric/Behavioral:  Negative for depression and sleep disturbance. The patient is not nervous/anxious.      VITALS:  Blood pressure 98/69, pulse 68, temperature 98.2 F (36.8 C), temperature source Oral, resp. rate 18, height 4\' 11"  (1.499 m), weight 103 lb 3.2 oz (46.8 kg), SpO2 98%.  Wt Readings from Last 3 Encounters:  11/28/22 103 lb 3.2 oz (46.8 kg)  11/08/22 107 lb 6.4 oz (48.7 kg)  10/17/22 104 lb 12.8 oz (47.5 kg)    Body mass index is 20.84 kg/m.  Performance status (ECOG): 1 - Symptomatic but completely ambulatory  PHYSICAL EXAM:  Physical Exam Vitals and nursing note reviewed.  Constitutional:      General: She is not in acute distress.    Appearance: Normal appearance.  HENT:     Head: Normocephalic and atraumatic.     Mouth/Throat:     Mouth: Mucous membranes are moist.     Pharynx: Oropharynx is clear. No oropharyngeal exudate or posterior oropharyngeal erythema.  Eyes:     General: No scleral icterus.    Extraocular Movements: Extraocular movements intact.     Conjunctiva/sclera: Conjunctivae normal.     Pupils: Pupils are equal, round, and reactive to light.  Cardiovascular:     Rate and Rhythm: Normal rate and regular rhythm.     Heart sounds: Normal heart sounds. No murmur heard.    No friction rub. No gallop.  Pulmonary:     Effort: Pulmonary effort is normal.     Breath sounds: Examination of the left-lower field reveals decreased breath sounds. Decreased breath sounds present. No wheezing, rhonchi or rales.  Abdominal:     General: There is no distension.     Palpations: Abdomen is soft. There is no hepatomegaly, splenomegaly or mass.     Tenderness: There is no abdominal tenderness.   Musculoskeletal:        General: Normal range of motion.     Cervical back: Normal range of motion and neck supple. No tenderness.     Right lower leg: No edema.     Left lower leg: No edema.  Lymphadenopathy:     Cervical: No cervical adenopathy.     Upper Body:     Right upper body: No supraclavicular or axillary adenopathy.     Left upper body: No supraclavicular or axillary adenopathy.     Lower Body: No right inguinal adenopathy. No left inguinal adenopathy.  Skin:    General: Skin is warm and dry.     Coloration: Skin is not jaundiced.  Findings: No rash.  Neurological:     Mental Status: She is alert and oriented to person, place, and time.     Cranial Nerves: No cranial nerve deficit.  Psychiatric:        Mood and Affect: Mood normal.        Behavior: Behavior normal.        Thought Content: Thought content normal.     LABS:      Latest Ref Rng & Units 11/28/2022    9:43 AM 10/17/2022   10:50 AM 09/24/2022   12:00 AM  CBC  WBC 4.0 - 10.5 K/uL 9.1  10.6  12.4      Hemoglobin 12.0 - 15.0 g/dL 60.4  54.0  98.1      Hematocrit 36.0 - 46.0 % 40.1  43.2  41      Platelets 150 - 400 K/uL 268  290  292         This result is from an external source.      Latest Ref Rng & Units 11/28/2022    9:43 AM 10/17/2022   10:50 AM 09/24/2022   12:00 AM  CMP  Glucose 70 - 99 mg/dL 191  478    BUN 8 - 23 mg/dL 15  13  13       Creatinine 0.44 - 1.00 mg/dL 2.95  6.21  0.7      Sodium 135 - 145 mmol/L 140  140  138      Potassium 3.5 - 5.1 mmol/L 3.4  4.2  3.0      Chloride 98 - 111 mmol/L 102  102  97      CO2 22 - 32 mmol/L 27  27  28       Calcium 8.9 - 10.3 mg/dL 9.7  9.6  30.8      Total Protein 6.5 - 8.1 g/dL 7.3  7.3    Total Bilirubin 0.3 - 1.2 mg/dL 2.8  1.2    Alkaline Phos 38 - 126 U/L 58  58  71      AST 15 - 41 U/L 15  18  31       ALT 0 - 44 U/L 11  13  16          This result is from an external source.     Lab Results  Component Value Date   CEA1 5.3  (H) 10/17/2022   /  CEA  Date Value Ref Range Status  10/17/2022 5.3 (H) 0.0 - 4.7 ng/mL Final    Comment:    (NOTE)                             Nonsmokers          <3.9                             Smokers             <5.6 Roche [employer] Electrochemiluminescence Immunoassay (ECLIA) Values obtained with different assay methods or kits cannot be used interchangeably.  Results cannot be interpreted as absolute evidence of the presence or absence of malignant disease. Performed At: [facility] [address] [place], [place] [id number] Jolene Schimke MD XB:[id number]     STUDIES:  No results found.    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 Report date is 12/12/2020.     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   Leukocytosis 01/17/2022   Migraines    Nodule of left lung 11/21/2020   Non-small cell lung cancer, left (HCC) 01/15/2021   Port-A-Cath in place 09/05/2022   Tobacco use  12/07/2020    Past Surgical History:  Procedure Laterality Date   ABDOMINAL HYSTERECTOMY  1989   unsure of vaginal or abdominal   BRONCHIAL BIOPSY  01/15/2021   Procedure: BRONCHIAL BIOPSIES;  Surgeon: Leslye Peer, MD;  Location: [facility] ENDOSCOPY;  Service: Pulmonary;;   BRONCHIAL BRUSHINGS  01/15/2021   Procedure: BRONCHIAL BRUSHINGS;  Surgeon: Leslye Peer, MD;  Location: [facility] ENDOSCOPY;  Service: Pulmonary;;   BRONCHIAL NEEDLE ASPIRATION BIOPSY  01/15/2021   Procedure: BRONCHIAL NEEDLE ASPIRATION BIOPSIES;  Surgeon: Leslye Peer, MD;  Location: [facility] ENDOSCOPY;  Service: Pulmonary;;   COLONOSCOPY  2022   FIDUCIAL MARKER PLACEMENT  01/15/2021   Procedure: FIDUCIAL MARKER PLACEMENT;  Surgeon: Leslye Peer, MD;  Location: [facility] ENDOSCOPY;  Service: Pulmonary;;   ovaries removed  1986   surgery to remove scar tissue     from colon/bladder in patient's late 30's   TUBAL LIGATION  1986   UPPER GI ENDOSCOPY     years ago in patient's late 87s   VIDEO BRONCHOSCOPY WITH RADIAL ENDOBRONCHIAL ULTRASOUND  01/15/2021   Procedure: VIDEO BRONCHOSCOPY WITH RADIAL ENDOBRONCHIAL ULTRASOUND;  Surgeon: Leslye Peer, MD;  Location: [facility] ENDOSCOPY;  Service: Pulmonary;;    Family History  Problem Relation Age of Onset   Pancreatic cancer Maternal Aunt 50   Brain cancer Maternal Uncle    Stomach cancer Maternal Uncle        dx. >50   Prostate cancer Half-Brother        passed away at 11   Prostate cancer Half-Brother     Social History:  reports that she quit smoking about 19 months ago. Her smoking use included cigarettes. She started smoking about 61 years ago. She has a 30.1 pack-year smoking history. She has never used smokeless tobacco. She reports current alcohol use of about 4.0 standard drinks of alcohol per week. She reports current drug use. Frequency: 3.00 times per week. Drug: Marijuana.The patient is alone today.  Allergies:  Allergies  Allergen Reactions   Codeine Rash    Demerol [Meperidine Hcl] Other (See Comments)    coma   Meperidine Other (See Comments)    Several days will pass without patient being aware   Other Dermatitis    Stainless steel / skin weeps   Silver Dermatitis  Sterling silver skin weeps   Gold Bond [Menthol-Zinc Oxide] Dermatitis    Skin weeps   Neosporin [Bacitracin-Polymyxin B] Dermatitis    Skin weeps   Wound Dressing Adhesive Other (See Comments)    CAUSES SKIN TEARS PER PATIENT. INSTEAD USE THE TEGADERM 51M WITH ADHESIVE FREE WINDOW OVER PORT.    Current Medications: Current Outpatient Medications  Medication Sig Dispense Refill   meloxicam (MOBIC) 7.5 MG tablet Take 1 tablet (7.5 mg total) by mouth daily. 30 tablet 0   acetaminophen (TYLENOL) 500 MG tablet Take 500 mg by mouth as needed for moderate pain or mild pain.     albuterol (VENTOLIN HFA) 108 (90 Base) MCG/ACT inhaler Inhale 2 puffs into the lungs every 4 (four) hours as needed for wheezing or shortness of breath.     amitriptyline (ELAVIL) 10 MG tablet Take 10 mg by mouth at bedtime as needed for sleep.     aspirin 81 MG EC tablet Take 81 mg by mouth daily.     atorvastatin (LIPITOR) 40 MG tablet Take 40 mg by mouth daily.     Biotin 5000 MCG CAPS Take 5,000 mcg by mouth daily.     Collagen-Vitamin C (SUPER COLLAGEN PLUS VITAMIN C) 1000-10 MG TABS Take 1 tablet by mouth daily.     fluticasone (FLONASE) 50 MCG/ACT nasal spray Place 1 spray into both nostrils daily.     HYDROcodone-acetaminophen (NORCO/VICODIN) 5-325 MG tablet Take 1 tablet by mouth every 6 (six) hours as needed for moderate pain or severe pain. 30 tablet 0   levothyroxine (SYNTHROID) 75 MCG tablet Take 75 mcg by mouth daily.     lidocaine-prilocaine (EMLA) cream Apply 1 Application topically as needed. 30 g 0   Menaquinone-7 (VITAMIN K2) 100 MCG CAPS Take 1 capsule by mouth daily.     Multiple Vitamins-Minerals (CENTRUM MINIS WOMEN 50+ PO) Take 1 tablet by mouth daily.     ondansetron (ZOFRAN) 4 MG  tablet Take 1 tablet (4 mg total) by mouth every 4 (four) hours as needed for nausea. 90 tablet 3   pantoprazole (PROTONIX) 40 MG tablet Take 1 tablet (40 mg total) by mouth daily. 60 tablet 2   potassium chloride SA (KLOR-CON M) 20 MEQ tablet Take 3 tablets (60 mEq total) by mouth 2 (two) times daily. Taking 2 tablets in the morning and 2 tablets in the afternoon (Patient taking differently: Take 60 mEq by mouth 2 (two) times daily. Patient voiced taking one tablet in morning and two tablets in the evening.) 180 tablet 3   prochlorperazine (COMPAZINE) 10 MG tablet TAKE 1 TABLET(10 MG) BY MOUTH EVERY 6 HOURS AS NEEDED FOR NAUSEA OR VOMITING 360 tablet 0   traZODone (DESYREL) 50 MG tablet Take 25 mg by mouth at bedtime as needed for sleep. (Patient not taking: Reported on 11/28/2022)     vitamin E 180 MG (400 UNITS) capsule Take 400 Units by mouth daily. (Patient not taking: Reported on 11/28/2022)     No current facility-administered medications for this visit.

## 2022-11-28 NOTE — Assessment & Plan Note (Addendum)
Resolved with the patient resuming her potassium supplement. She states she continues potassium chloride twice daily, but her potassium is borderline low today, so I encouraged her to continue the potassium as prescribed.

## 2022-11-28 NOTE — Assessment & Plan Note (Signed)
She had been on levothyroxine 75 mcg daily since May.  TSH was suppressed in July, so levothyroxine was decreased to 50 mcg daily.  Her TSH was elevated again in September, so I increased levothyroxine to 75 mcg daily.

## 2022-11-28 NOTE — Assessment & Plan Note (Addendum)
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  She was treated with left lower lobectomy in [place].  Her doctor there recommended chemotherapy with immunotherapy, but she refused chemotherapy.  PD-L1 was positive.  She was only willing to take the immunotherapy for 1 year. She started pembrolizumab in August 2023.  We saw her in October 2023, when she returned to [place] and continued pembrolizumab every 21 days. Routine CT imaging has not shown any evidence of recurrence.  CT chest on August 27 was once again negative for recurrence.  Surgical changes were seen in the left lower lobe.  There were stable scattered small bilateral pulmonary nodules felt to be likely benign emphysema and aortic atherosclerosis were seen.  She has leukocytosis and mild hyperbilirubinemia, both of which have been intermittent.  She has no obvious infection.  We will continue to monitor these.  She completed 17 cycles of adjuvant pembrolizumab in August.  We will see her back in about 2 months imaging with a CBC, comprehensive metabolic panel CEA, TSH and CT chest for repeat clinical assessment.

## 2022-11-28 NOTE — Assessment & Plan Note (Addendum)
Significant increase in her bilirubin. Will check for hemolysis, although hemoglobin normal. Will obtain liver u/s. Asked her to discontinue acetaminophen and use meloxicam prescribed today for joint pain. Asked her to avoid alcohol. This rarely could be a late effect of the immunotherapy, so may need to consider steroid treatment.  I will plan to repeat a CMP early next week.

## 2022-11-29 ENCOUNTER — Telehealth: Payer: Self-pay

## 2022-11-29 LAB — T4: T4, Total: 10.2 ug/dL (ref 4.5–12.0)

## 2022-11-29 NOTE — Telephone Encounter (Signed)
Patient notified and voiced understanding.

## 2022-11-29 NOTE — Telephone Encounter (Signed)
-----   Message from [name] sent at 11/28/2022  5:18 PM EDT ----- Please let her know her TSH was normal. Continue the same dose of levothyroxine. Thanks

## 2022-12-03 ENCOUNTER — Inpatient Hospital Stay: Payer: Medicare HMO | Attending: Hematology and Oncology

## 2022-12-03 ENCOUNTER — Telehealth: Payer: Self-pay

## 2022-12-03 DIAGNOSIS — C3432 Malignant neoplasm of lower lobe, left bronchus or lung: Secondary | ICD-10-CM | POA: Insufficient documentation

## 2022-12-03 LAB — CMP (CANCER CENTER ONLY)
ALT: 5 U/L (ref 0–44)
AST: 15 U/L (ref 15–41)
Albumin: 4.3 g/dL (ref 3.5–5.0)
Alkaline Phosphatase: 79 U/L (ref 38–126)
Anion gap: 10 (ref 5–15)
BUN: 10 mg/dL (ref 8–23)
CO2: 27 mmol/L (ref 22–32)
Calcium: 9.9 mg/dL (ref 8.9–10.3)
Chloride: 107 mmol/L (ref 98–111)
Creatinine: 0.92 mg/dL (ref 0.44–1.00)
GFR, Estimated: >60 mL/min (ref >60)
Glucose, Bld: 82 mg/dL (ref 70–99)
Potassium: 5 mmol/L (ref 3.5–5.1)
Sodium: 144 mmol/L (ref 135–145)
Total Bilirubin: 1 mg/dL (ref <1.2)
Total Protein: 7 g/dL (ref 6.5–8.1)

## 2022-12-03 NOTE — Telephone Encounter (Signed)
-----   Message from [name] sent at 12/03/2022  1:10 PM EST ----- Please let her know her liver test is back to normal. Her potassium is 5, was only 3.4 last week. If she is taking potassium twice daily, can decrease to once a day. Thanks

## 2022-12-03 NOTE — Telephone Encounter (Signed)
Patient notified and voiced understanding.

## 2023-01-02 ENCOUNTER — Telehealth: Payer: Self-pay | Admitting: Hematology and Oncology

## 2023-01-02 NOTE — Telephone Encounter (Signed)
CT Chest has been scheduled for 01/13/23 @ 11:30; Check in @ 10:30   Notified pt of date,time and instructions.

## 2023-01-13 LAB — BASIC METABOLIC PANEL
BUN: 15 (ref 4–21)
CO2: 25 — AB (ref 13–22)
Chloride: 108 (ref 99–108)
Creatinine: 0.8 (ref 0.5–1.1)
Glucose: 89
Potassium: 4.1 meq/L (ref 3.5–5.1)
Sodium: 139 (ref 137–147)

## 2023-01-13 LAB — TSH: TSH: 3.59 (ref 0.41–5.90)

## 2023-01-13 LAB — HEPATIC FUNCTION PANEL
ALT: 10 U/L (ref 7–35)
AST: 18 (ref 13–35)
Alkaline Phosphatase: 70 (ref 25–125)
Bilirubin, Total: 1.8

## 2023-01-13 LAB — CBC AND DIFFERENTIAL
HCT: 38 (ref 36–46)
Hemoglobin: 13.3 (ref 12.0–16.0)
Neutrophils Absolute: 4.41
Platelets: 259 10*3/uL (ref 150–400)
WBC: 9.8

## 2023-01-13 LAB — CBC: RBC: 3.89 (ref 3.87–5.11)

## 2023-01-13 LAB — COMPREHENSIVE METABOLIC PANEL
Albumin: 4 (ref 3.5–5.0)
Calcium: 9.5 (ref 8.7–10.7)
eGFR: 82

## 2023-01-13 LAB — LAB REPORT - SCANNED: EGFR: 82

## 2023-01-15 NOTE — Progress Notes (Addendum)
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  She was treated with left lower lobectomy in [place].  Her doctor there recommended chemotherapy with immunotherapy, but she refused chemotherapy.  PD-L1 was positive.  She was only willing to take the immunotherapy for 1 year. She started pembrolizumab in August 2023.  We saw her in October 2023, when she returned to [place] and continued pembrolizumab every 21 days for 1 year, completed in early September of 2024. Routine CT imaging in August was negative for recurrence. There were stable scattered small bilateral pulmonary nodules left, felt to be from benign emphysema Surgical changes were seen in the left lower lobe. She had a CT chest done on 01/13/2023 that revealed interval progressive in size and number of multiple bilateral pulmonary nodules most consistent with progression of metastatic disease. We have discussed options of treatment and she wishes not to pursue chemotherapy unless absolutely necessary. I have therefore recommended that she get back on Keytruda and we will monitor her response.   Hypothyroidism due to medication She had been on levothyroxine 75 mcg daily since May.  TSH was suppressed in July, so levothyroxine was decreased to 50 mcg daily.  Her TSH was elevated again in September, so I increased levothyroxine back to 75 mcg daily.    Hypokalemia Resolved, and the patient is not taking her potassium supplement. I will instruct her if she needs to resume it.   Hyperbilirubinemia Significant increase in her bilirubin.  There was no evidence of hemolysis or biliary obstruction and this returned to normal by the following week. We asked her to discontinue  acetaminophen and use meloxicam prescribed for joint pain. We also instructed her to avoid alcohol. This rarely could be a late effect of the immunotherapy, so may need to consider steroid treatment. Repeat was completley normal within 1 week and is just mildly elevated today at 1.8. We will continue to monitor.   Plan: II will refill Synthroid 75 mcg daily. She informed me that she hasn't smoked in over 3 months and continues to abstain. She had a CT chest done on 01/13/2023 that revealed interval progressive in size and number of multiple bilateral pulmonary nodules most consistent with progression of metastatic disease, with the largest up to 7 mm, so not really accessible for biopsy or PET scan. There is one large pleural based plaque in the posteromedial left upper lobe measuring 2.9 cm.. She was previously treated with immunotherapy and I suggested restarting Keytruda, since this has occurred since it was stopped it 3 1/2 months ago. She inquired about how long would she have to be on Keytruda. I informed her the immunotherapy is palliative and not curative, so she will be on it until further notice as long as it keeps this controlled, unless she has unacceptable toxicities. She continues to decline chemotherapy as she feels this is too aggressive for her. She had labs done on 01/13/2023 with a WBC of 9.8, hemoglobin of 13.3, platelet count of 259,000, CEA of 4.2, and a normal CMP besides a elevated bilirubin of 1.8. On 11/28/2022 her bilirubin was elevated at 2.8 and returned to normal on 12/03/2022 at 1.0. She will return every 3 weeks with CBC, CMP, T4 and TSH and I  will see her back in 3 weeks after her first dose of Keytruda. She will be due for repeat scans in 3 months.I have answered her questions. The patient understands the plans discussed today and is in agreement with them.  She knows to contact our office if she develops concerns prior to her next appointment.   I provided 30 minutes of  face-to-face time during this encounter and > 50% was spent counseling as documented under my assessment and plan.    Dellia Beckwith, MD [facility] - A DEPT OF [facility] [address] [place] [address] Dept: [phone] Dept Fax: [phone]   No orders of the defined types were placed in this encounter.   CHIEF COMPLAINT:  CC: Stage IA2 adenocarcinoma of the lung   Current Treatment:  Observation   HISTORY OF PRESENT ILLNESS:  Sydney Hunt is a 62 y.o. female with a history of tobacco abuse who has adenocarcinoma of the left lung. This was originally found on lung cancer screening CT from September 2022. PET imaging was obtained in October which revealed a solitary 1.25 cm left lower lobe pulmonary nodule to be hypermetabolic and consistent with small primary lung neoplasm. No findings for mediastinal or hilar adenopathy or metastatic disease elsewhere was observed. She met with the genetic counselor and [facility] diagnostic testing was negative for any clinically significant mutations. CT chest from December revealed an enlarging left lower lobe nodule, measuring 1.4 x 1.9 cm. The additional smaller pulmonary nodules remained unchanged. She was referred to Dr. 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 Immunohistochemical stains were positive for TTF-1, and negative for p63 and CK5/6. 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 Her FEV1 was 1.98, which is 88% of predicted and her FVC was 2.58, which is 89% of predicted. She was deemed a candidate for resection and met with Dr. Cliffton Asters for consultation. As she is not motivated to quit smoking, he felt that surgery would present an excessive risk for postoperative respiratory complications. He recommended SBRT as a good option if the patient was unable to  quit smoking.  The patient went for a second opinion to Dr. Lucienne Minks and surgical resection was carried out in [place] on April 12 with a left lower lobe lobectomy.  She was hospitalized for 13 days and has not smoked since that time.  Pathology revealed a 2.5 cm adenocarcinoma, grade 2.  There was no evidence of pleural invasion and margins are clear but she tells me 2 of 20 nodes were positive, for a T1c N1 M0, stage IIB.  A port was placed on August 16 and she was started on pembrolizumab on September 7 and had her second dose on September 28. She refused chemotherapy. We recommended a full year of the pembrolizumab, which was completed in early September of 2024.  Oncology History  Non-small cell lung cancer, left (HCC)  01/15/2021 Initial Diagnosis   Non-small cell lung cancer, left (HCC)   02/12/2021 Cancer Staging   Staging form: Lung, AJCC 8th Edition - Clinical stage from 02/12/2021: Stage IA2 (cT1b, cN0, cM0) - Signed by Dellia Beckwith, MD on 02/14/2021 Histopathologic type: Adenocarcinoma, NOS Stage prefix: Initial diagnosis Histologic grade (G): GX Histologic grading system: 4 grade system Laterality: Left Tumor size (mm): 19 Lymph-vascular invasion (LVI): LVI not present (absent)/not identified Diagnostic confirmation: Positive histology Specimen type: Bronchial  Biopsy Staged by: Managing physician Stage used in treatment planning: Yes National guidelines used in treatment planning: Yes Type of national guideline used in treatment planning: NCCN Staging comments: Rec surgical resection   05/11/2021 Cancer Staging   Staging form: Lung, AJCC 8th Edition - Pathologic stage from 05/11/2021: Stage IIB (ypT1c, pN1, cM0) - Signed by Dellia Beckwith, MD on 11/12/2021 Histopathologic type: Adenocarcinoma, NOS Stage prefix: Post-therapy Histologic grade (G): G2 Histologic grading system: 4 grade system Residual tumor (R): R0 - None Laterality: Left Tumor size (mm):  25 Lymph-vascular invasion (LVI): LVI not present (absent)/not identified Diagnostic confirmation: Positive histology PLUS positive immunophenotyping and/or positive genetic studies Specimen type: Excision Staged by: Managing physician Type of lung cancer: Surgically resected non-small cell lung cancer ECOG performance status: Grade 1 Perineural invasion (PNI): Absent Pleural/elastic layer invasion: PL0 Pleural lavage cytology: Unknown Weight loss: Absent Adequacy of mediastinal dissection: Adequate Radiotherapy dose: No Adjuvant radiation: No Adjuvant chemotherapy: Yes Stage used in treatment planning: Yes National guidelines used in treatment planning: Yes Type of national guideline used in treatment planning: NCCN Staging comments: Adjuvant pembrolizumab, PDL 1 90%   10/04/2021 - 10/01/2022 Chemotherapy   Patient is on Treatment Plan : LUNG NSCLC Pembrolizumab Adj q21d x 18 cycles     02/04/2023 -  Chemotherapy   Patient is on Treatment Plan : LUNG NSCLC Pembrolizumab (200) q21d       INTERVAL HISTORY:  Sydney Hunt is here today for repeat clinical assessment for stage IA2 adenocarcinoma of the lung. Patient states that she feels well and continues to have chronic pain. She inquired about coming off her Synthroid medication and I advised she stay on this. I will refill Synthroid 75 mcg daily. She informed me that she hasn't smoked in over 3 months and continues to abstain. She had a CT chest done on 01/13/2023 that revealed interval progressive in size and number of multiple bilateral pulmonary nodules most consistent with progression of metastatic disease, with the largest up to 7 mm, so not really accessible for biopsy or PET scan. There is one large pleural based plaque in the posteromedial left upper lobe measuring 2.9 cm.. She was previously treated with immunotherapy and I suggested restarting Keytruda, since this has occurred since it was stopped it 3 1/2 months ago. She inquired about how  long would she have to be on Keytruda. I informed her the immunotherapy is palliative and not curative, so she will be on it until further notice as long as it keeps this controlled, unless she has unacceptable toxicities. She continues to decline chemotherapy as she feels this is too aggressive for her. She had labs done on 01/13/2023 with a WBC of 9.8, hemoglobin of 13.3, platelet count of 259,000, CEA of 4.2, and a normal CMP besides a elevated bilirubin of 1.8. On 11/28/2022 her bilirubin was elevated at 2.8 and returned to normal on 12/03/2022 at 1.0. She will return every 3 weeks with CBC, CMP, T4 and TSH and I will see her back in 3 weeks after her first dose of Keytruda. She will be due for repeat scans in 3 months.   She denies fevers or chills. She denies pain. Her appetite is not the best. Her weight has decreased 3 pounds over last 1.5 months .  REVIEW OF SYSTEMS:  Review of Systems  Constitutional:  Positive for appetite change (ok).  HENT:  Negative.    Eyes: Negative.   Respiratory: Negative.    Cardiovascular: Negative.   Gastrointestinal: Negative.  Endocrine: Negative.   Genitourinary: Negative.    Musculoskeletal:  Positive for arthralgias and myalgias.  Skin: Negative.   Neurological: Negative.   Hematological: Negative.   Psychiatric/Behavioral: Negative.       VITALS:  Blood pressure 127/86, pulse 75, temperature 98.1 F (36.7 C), temperature source Oral, resp. rate 18, height 4\' 11"  (1.499 m), weight 100 lb 14.4 oz (45.8 kg), SpO2 97%.  Wt Readings from Last 3 Encounters:  01/16/23 100 lb 14.4 oz (45.8 kg)  11/28/22 103 lb 3.2 oz (46.8 kg)  11/08/22 107 lb 6.4 oz (48.7 kg)    Body mass index is 20.38 kg/m.  Performance status (ECOG): 1 - Symptomatic but completely ambulatory  PHYSICAL EXAM:  Physical Exam Vitals and nursing note reviewed.  Constitutional:      General: She is not in acute distress.    Appearance: Normal appearance. She is normal weight.  She is not ill-appearing, toxic-appearing or diaphoretic.  HENT:     Head: Normocephalic and atraumatic.     Right Ear: Tympanic membrane, ear canal and external ear normal. There is no impacted cerumen.     Left Ear: Tympanic membrane, ear canal and external ear normal. There is no impacted cerumen.     Nose: Nose normal. No congestion or rhinorrhea.     Mouth/Throat:     Mouth: Mucous membranes are moist.     Pharynx: Oropharynx is clear. No oropharyngeal exudate or posterior oropharyngeal erythema.  Eyes:     General: No scleral icterus.    Extraocular Movements: Extraocular movements intact.     Conjunctiva/sclera: Conjunctivae normal.     Pupils: Pupils are equal, round, and reactive to light.  Neck:     Vascular: No carotid bruit.  Cardiovascular:     Rate and Rhythm: Normal rate and regular rhythm.     Pulses: Normal pulses.     Heart sounds: Normal heart sounds. No murmur heard.    No friction rub. No gallop.  Pulmonary:     Effort: Pulmonary effort is normal. No respiratory distress.     Breath sounds: Normal breath sounds. No wheezing or rales.  Abdominal:     General: Bowel sounds are normal. There is no distension.     Palpations: Abdomen is soft. There is no hepatomegaly, splenomegaly or mass.     Tenderness: There is no abdominal tenderness.  Musculoskeletal:        General: Normal range of motion.     Cervical back: Normal range of motion and neck supple. No rigidity or tenderness.     Right lower leg: No edema.     Left lower leg: No edema.  Lymphadenopathy:     Cervical: No cervical adenopathy.     Right cervical: No superficial, deep or posterior cervical adenopathy.    Left cervical: No superficial, deep or posterior cervical adenopathy.     Upper Body:     Right upper body: No supraclavicular, axillary or pectoral adenopathy.     Left upper body: No supraclavicular, axillary or pectoral adenopathy.  Skin:    General: Skin is warm and dry.  Neurological:      General: No focal deficit present.     Mental Status: She is alert and oriented to person, place, and time. Mental status is at baseline.  Psychiatric:        Mood and Affect: Mood normal.        Behavior: Behavior normal.        Thought Content: Thought  content normal.        Judgment: Judgment normal.    LABS:      Latest Ref Rng & Units 11/28/2022    9:43 AM 10/17/2022   10:50 AM 09/24/2022   12:00 AM  CBC  WBC 4.0 - 10.5 K/uL 9.1  10.6  12.4      Hemoglobin 12.0 - 15.0 g/dL 69.6  29.5  28.4      Hematocrit 36.0 - 46.0 % 40.1  43.2  41      Platelets 150 - 400 K/uL 268  290  292         This result is from an external source.      Latest Ref Rng & Units 12/03/2022   10:21 AM 11/28/2022    9:43 AM 10/17/2022   10:50 AM  CMP  Glucose 70 - 99 mg/dL 82  132  440   BUN 8 - 23 mg/dL 10  15  13    Creatinine 0.44 - 1.00 mg/dL 1.02  7.25  3.66   Sodium 135 - 145 mmol/L 144  140  140   Potassium 3.5 - 5.1 mmol/L 5.0  3.4  4.2   Chloride 98 - 111 mmol/L 107  102  102   CO2 22 - 32 mmol/L 27  27  27    Calcium 8.9 - 10.3 mg/dL 9.9  9.7  9.6   Total Protein 6.5 - 8.1 g/dL 7.0  7.3  7.3   Total Bilirubin <1.2 mg/dL 1.0  2.8  1.2   Alkaline Phos 38 - 126 U/L 79  58  58   AST 15 - 41 U/L 15  15  18    ALT 0 - 44 U/L 5  11  13       Lab Results  Component Value Date   CEA1 5.3 (H) 10/17/2022   /  CEA  Date Value Ref Range Status  10/17/2022 5.3 (H) 0.0 - 4.7 ng/mL Final    Comment:    (NOTE)                             Nonsmokers          <3.9                             Smokers             <5.6 Roche [employer] Electrochemiluminescence Immunoassay (ECLIA) Values obtained with different assay methods or kits cannot be used interchangeably.  Results cannot be interpreted as absolute evidence of the presence or absence of malignant disease. Performed At: [facility] [address] [place], [place] [id number] Jolene Schimke MD ZD:[id number]    No results  found for: "PSA1" No results found for: "[id number]" No results found for: "CAN125"  No results found for: "TOTALPROTELP", "ALBUMINELP", "A1GS", "A2GS", "BETS", "BETA2SER", "GAMS", "MSPIKE", "SPEI" No results found for: "TIBC", "FERRITIN", "IRONPCTSAT" Lab Results  Component Value Date   LDH 130 11/28/2022    STUDIES:  Exam: 01/13/2023 CT Chest with CM Impression: Interval progressive in size and number of multiple bilateral pulmonary nodules. These findings are most consistent with progression of metastatic disease.     HISTORY:   Past Medical History:  Diagnosis Date   Allergy    Arthritis    Atherosclerotic vascular disease 01/16/2022   Cardiac murmur 01/16/2022   Complication of anesthesia    slow to wake  up   COPD (chronic obstructive pulmonary disease) (HCC)    COPD, mild (HCC) 12/15/2020   DOE (dyspnea on exertion) 01/16/2022   Family history of pancreatic cancer 11/28/2020   Family history of prostate cancer 11/28/2020   Family history of stomach cancer 11/28/2020   Genetic testing 12/18/2020   [facility] Multi-Cancer Panel was Negative. Report date is 12/12/2020.     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   Leukocytosis 01/17/2022   Migraines    Nodule of left lung 11/21/2020   Non-small cell lung cancer, left (HCC) 01/15/2021   Port-A-Cath in place 09/05/2022   Tobacco use 12/07/2020    Past Surgical History:  Procedure Laterality Date   ABDOMINAL HYSTERECTOMY  1989    unsure of vaginal or abdominal   BRONCHIAL BIOPSY  01/15/2021   Procedure: BRONCHIAL BIOPSIES;  Surgeon: Leslye Peer, MD;  Location: [facility] ENDOSCOPY;  Service: Pulmonary;;   BRONCHIAL BRUSHINGS  01/15/2021   Procedure: BRONCHIAL BRUSHINGS;  Surgeon: Leslye Peer, MD;  Location: [facility] ENDOSCOPY;  Service: Pulmonary;;   BRONCHIAL NEEDLE ASPIRATION BIOPSY  01/15/2021   Procedure: BRONCHIAL NEEDLE ASPIRATION BIOPSIES;  Surgeon: Leslye Peer, MD;  Location: [facility] ENDOSCOPY;  Service: Pulmonary;;   COLONOSCOPY  2022   FIDUCIAL MARKER PLACEMENT  01/15/2021   Procedure: FIDUCIAL MARKER PLACEMENT;  Surgeon: Leslye Peer, MD;  Location: [facility] ENDOSCOPY;  Service: Pulmonary;;   ovaries removed  1986   surgery to remove scar tissue     from colon/bladder in patient's late 30's   TUBAL LIGATION  1986   UPPER GI ENDOSCOPY     years ago in patient's late 84s   VIDEO BRONCHOSCOPY WITH RADIAL ENDOBRONCHIAL ULTRASOUND  01/15/2021   Procedure: VIDEO BRONCHOSCOPY WITH RADIAL ENDOBRONCHIAL ULTRASOUND;  Surgeon: Leslye Peer, MD;  Location: [facility] ENDOSCOPY;  Service: Pulmonary;;    Family History  Problem Relation Age of Onset   Pancreatic cancer Maternal Aunt 50   Brain cancer Maternal Uncle    Stomach cancer Maternal Uncle        dx. >50   Prostate cancer Half-Brother        passed away at 24   Prostate cancer Half-Brother     Social History:  reports that she quit smoking about 20 months ago. Her smoking use included cigarettes. She started smoking about 62 years ago. She has a 30.1 pack-year smoking history. She has never used smokeless tobacco. She reports current alcohol use of about 4.0 standard drinks of alcohol per week. She reports current drug use. Frequency: 3.00 times per week. Drug: Marijuana.The patient is alone today.  Allergies:  Allergies  Allergen Reactions   Codeine Rash   Demerol [Meperidine Hcl] Other (See Comments)    coma   Meperidine Other (See Comments)    Several days  will pass without patient being aware   Other Dermatitis    Stainless steel / skin weeps   Silver Dermatitis    Sterling silver skin weeps   Gold Bond [Menthol-Zinc Oxide] Dermatitis    Skin weeps  Neosporin [Bacitracin-Polymyxin B] Dermatitis    Skin weeps   Wound Dressing Adhesive Other (See Comments)    CAUSES SKIN TEARS PER PATIENT. INSTEAD USE THE TEGADERM 60M WITH ADHESIVE FREE WINDOW OVER PORT.    Current Medications: Current Outpatient Medications  Medication Sig Dispense Refill   acetaminophen (TYLENOL) 500 MG tablet Take 500 mg by mouth as needed for moderate pain or mild pain.     albuterol (VENTOLIN HFA) 108 (90 Base) MCG/ACT inhaler Inhale 2 puffs into the lungs every 4 (four) hours as needed for wheezing or shortness of breath.     amitriptyline (ELAVIL) 10 MG tablet Take 10 mg by mouth at bedtime as needed for sleep.     aspirin 81 MG EC tablet Take 81 mg by mouth daily.     atorvastatin (LIPITOR) 40 MG tablet Take 40 mg by mouth daily.     Biotin 5000 MCG CAPS Take 5,000 mcg by mouth daily.     Collagen-Vitamin C (SUPER COLLAGEN PLUS VITAMIN C) 1000-10 MG TABS Take 1 tablet by mouth daily.     fluticasone (FLONASE) 50 MCG/ACT nasal spray Place 1 spray into both nostrils daily.     HYDROcodone-acetaminophen (NORCO/VICODIN) 5-325 MG tablet Take 1 tablet by mouth every 6 (six) hours as needed for moderate pain or severe pain. 30 tablet 0   levothyroxine (SYNTHROID) 75 MCG tablet Take 1 tablet (75 mcg total) by mouth daily. 30 tablet 5   lidocaine-prilocaine (EMLA) cream Apply 1 Application topically as needed. 30 g 0   meloxicam (MOBIC) 7.5 MG tablet Take 1 tablet (7.5 mg total) by mouth daily. 30 tablet 0   Menaquinone-7 (VITAMIN K2) 100 MCG CAPS Take 1 capsule by mouth daily.     Multiple Vitamins-Minerals (CENTRUM MINIS WOMEN 50+ PO) Take 1 tablet by mouth daily.     ondansetron (ZOFRAN) 4 MG tablet Take 1 tablet (4 mg total) by mouth every 4 (four) hours as needed for  nausea. 90 tablet 3   pantoprazole (PROTONIX) 40 MG tablet Take 1 tablet (40 mg total) by mouth daily. 60 tablet 2   potassium chloride SA (KLOR-CON M) 20 MEQ tablet Take 3 tablets (60 mEq total) by mouth 2 (two) times daily. Taking 2 tablets in the morning and 2 tablets in the afternoon (Patient taking differently: Take 60 mEq by mouth 2 (two) times daily. Patient voiced taking one tablet in morning and two tablets in the evening.) 180 tablet 3   prochlorperazine (COMPAZINE) 10 MG tablet TAKE 1 TABLET(10 MG) BY MOUTH EVERY 6 HOURS AS NEEDED FOR NAUSEA OR VOMITING 360 tablet 0   traZODone (DESYREL) 50 MG tablet Take 25 mg by mouth at bedtime as needed for sleep. (Patient not taking: Reported on 11/28/2022)     vitamin E 180 MG (400 UNITS) capsule Take 400 Units by mouth daily. (Patient not taking: Reported on 11/28/2022)     No current facility-administered medications for this visit.     I,Jasmine M Lassiter,acting as a scribe for Dellia Beckwith, MD.,have documented all relevant documentation on the behalf of Dellia Beckwith, MD,as directed by  Dellia Beckwith, MD while in the presence of Dellia Beckwith, MD.

## 2023-01-15 NOTE — Assessment & Plan Note (Signed)
She had been on levothyroxine 75 mcg daily since May.  TSH was suppressed in July, so levothyroxine was decreased to 50 mcg daily.  Her TSH was elevated again in September, so I increased levothyroxine to 75 mcg daily.

## 2023-01-15 NOTE — Assessment & Plan Note (Signed)
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  She was treated with left lower lobectomy in [place].  Her doctor there recommended chemotherapy with immunotherapy, but she refused chemotherapy.  PD-L1 was positive.  She was only willing to take the immunotherapy for 1 year. She started pembrolizumab in August 2023.  We saw her in October 2023, when she returned to [place] and continued pembrolizumab every 21 days. Routine CT imaging has not shown any evidence of recurrence.  CT chest on August 27 was once again negative for recurrence.  Surgical changes were seen in the left lower lobe.  There were stable scattered small bilateral pulmonary nodules felt to be likely benign emphysema and aortic atherosclerosis were seen.  She has leukocytosis and mild hyperbilirubinemia, both of which have been intermittent.  She has no obvious infection.  We will continue to monitor these.  She completed 17 cycles of adjuvant pembrolizumab in August.  We will see her back in about 2 months imaging with a CBC, comprehensive metabolic panel CEA, TSH and CT chest for repeat clinical assessment.

## 2023-01-15 NOTE — Assessment & Plan Note (Signed)
Resolved with the patient resuming her potassium supplement. She states she continues potassium chloride twice daily, but her potassium is borderline low today, so I encouraged her to continue the potassium as prescribed.

## 2023-01-15 NOTE — Assessment & Plan Note (Signed)
Significant increase in her bilirubin. Will check for hemolysis, although hemoglobin normal. Will obtain liver u/s. Asked her to discontinue acetaminophen and use meloxicam prescribed today for joint pain. Asked her to avoid alcohol. This rarely could be a late effect of the immunotherapy, so may need to consider steroid treatment.  I will plan to repeat a CMP early next week.

## 2023-01-16 ENCOUNTER — Inpatient Hospital Stay: Payer: Medicaid Other | Attending: Hematology and Oncology | Admitting: Oncology

## 2023-01-16 ENCOUNTER — Other Ambulatory Visit: Payer: Self-pay | Admitting: Oncology

## 2023-01-16 ENCOUNTER — Encounter: Payer: Self-pay | Admitting: Oncology

## 2023-01-16 VITALS — BP 127/86 | HR 75 | Temp 98.1°F | Resp 18 | Ht 59.0 in | Wt 100.9 lb

## 2023-01-16 DIAGNOSIS — Z87891 Personal history of nicotine dependence: Secondary | ICD-10-CM | POA: Diagnosis not present

## 2023-01-16 DIAGNOSIS — Z90722 Acquired absence of ovaries, bilateral: Secondary | ICD-10-CM | POA: Insufficient documentation

## 2023-01-16 DIAGNOSIS — G8929 Other chronic pain: Secondary | ICD-10-CM | POA: Insufficient documentation

## 2023-01-16 DIAGNOSIS — C3492 Malignant neoplasm of unspecified part of left bronchus or lung: Secondary | ICD-10-CM

## 2023-01-16 DIAGNOSIS — F1290 Cannabis use, unspecified, uncomplicated: Secondary | ICD-10-CM | POA: Insufficient documentation

## 2023-01-16 DIAGNOSIS — Z885 Allergy status to narcotic agent status: Secondary | ICD-10-CM | POA: Diagnosis not present

## 2023-01-16 DIAGNOSIS — E032 Hypothyroidism due to medicaments and other exogenous substances: Secondary | ICD-10-CM | POA: Insufficient documentation

## 2023-01-16 DIAGNOSIS — M791 Myalgia, unspecified site: Secondary | ICD-10-CM | POA: Insufficient documentation

## 2023-01-16 DIAGNOSIS — I10 Essential (primary) hypertension: Secondary | ICD-10-CM | POA: Insufficient documentation

## 2023-01-16 DIAGNOSIS — E876 Hypokalemia: Secondary | ICD-10-CM | POA: Diagnosis not present

## 2023-01-16 DIAGNOSIS — J439 Emphysema, unspecified: Secondary | ICD-10-CM | POA: Insufficient documentation

## 2023-01-16 DIAGNOSIS — M2550 Pain in unspecified joint: Secondary | ICD-10-CM | POA: Insufficient documentation

## 2023-01-16 DIAGNOSIS — Z90710 Acquired absence of both cervix and uterus: Secondary | ICD-10-CM | POA: Insufficient documentation

## 2023-01-16 DIAGNOSIS — Z800 Family history of malignant neoplasm of digestive organs: Secondary | ICD-10-CM | POA: Insufficient documentation

## 2023-01-16 DIAGNOSIS — C3432 Malignant neoplasm of lower lobe, left bronchus or lung: Secondary | ICD-10-CM | POA: Insufficient documentation

## 2023-01-16 DIAGNOSIS — Z79899 Other long term (current) drug therapy: Secondary | ICD-10-CM | POA: Diagnosis not present

## 2023-01-16 DIAGNOSIS — Z8042 Family history of malignant neoplasm of prostate: Secondary | ICD-10-CM | POA: Insufficient documentation

## 2023-01-16 MED ORDER — LEVOTHYROXINE SODIUM 75 MCG PO TABS: 75.0000 ug | ORAL_TABLET | Freq: Every day | ORAL | 5 refills | Status: DC

## 2023-01-17 ENCOUNTER — Other Ambulatory Visit: Payer: Self-pay

## 2023-01-17 DIAGNOSIS — C3492 Malignant neoplasm of unspecified part of left bronchus or lung: Secondary | ICD-10-CM

## 2023-01-20 ENCOUNTER — Encounter: Payer: Self-pay | Admitting: Oncology

## 2023-01-21 ENCOUNTER — Encounter: Payer: Self-pay | Admitting: Hematology and Oncology

## 2023-01-27 LAB — CEA: CEA: 4.2

## 2023-01-29 ENCOUNTER — Encounter: Payer: Self-pay | Admitting: Oncology

## 2023-01-30 ENCOUNTER — Encounter: Payer: Self-pay | Admitting: Oncology

## 2023-01-31 ENCOUNTER — Encounter: Payer: Self-pay | Admitting: Hematology and Oncology

## 2023-02-04 ENCOUNTER — Other Ambulatory Visit: Payer: Self-pay | Admitting: Oncology

## 2023-02-04 ENCOUNTER — Inpatient Hospital Stay: Payer: 59 | Attending: Hematology and Oncology | Admitting: Oncology

## 2023-02-04 ENCOUNTER — Inpatient Hospital Stay: Payer: 59

## 2023-02-04 ENCOUNTER — Encounter: Payer: Self-pay | Admitting: Oncology

## 2023-02-04 VITALS — BP 150/79 | HR 69 | Temp 98.0°F | Resp 19 | Ht 59.0 in | Wt 101.0 lb

## 2023-02-04 DIAGNOSIS — Z5112 Encounter for antineoplastic immunotherapy: Secondary | ICD-10-CM | POA: Diagnosis present

## 2023-02-04 DIAGNOSIS — Z79890 Hormone replacement therapy (postmenopausal): Secondary | ICD-10-CM | POA: Diagnosis not present

## 2023-02-04 DIAGNOSIS — C3492 Malignant neoplasm of unspecified part of left bronchus or lung: Secondary | ICD-10-CM

## 2023-02-04 DIAGNOSIS — I7090 Unspecified atherosclerosis: Secondary | ICD-10-CM | POA: Diagnosis not present

## 2023-02-04 DIAGNOSIS — F17210 Nicotine dependence, cigarettes, uncomplicated: Secondary | ICD-10-CM | POA: Diagnosis not present

## 2023-02-04 DIAGNOSIS — C7801 Secondary malignant neoplasm of right lung: Secondary | ICD-10-CM | POA: Insufficient documentation

## 2023-02-04 DIAGNOSIS — Z79899 Other long term (current) drug therapy: Secondary | ICD-10-CM | POA: Insufficient documentation

## 2023-02-04 DIAGNOSIS — Z885 Allergy status to narcotic agent status: Secondary | ICD-10-CM | POA: Diagnosis not present

## 2023-02-04 DIAGNOSIS — C7802 Secondary malignant neoplasm of left lung: Secondary | ICD-10-CM | POA: Diagnosis not present

## 2023-02-04 DIAGNOSIS — E876 Hypokalemia: Secondary | ICD-10-CM | POA: Diagnosis not present

## 2023-02-04 DIAGNOSIS — C3432 Malignant neoplasm of lower lobe, left bronchus or lung: Secondary | ICD-10-CM | POA: Insufficient documentation

## 2023-02-04 DIAGNOSIS — Z90722 Acquired absence of ovaries, bilateral: Secondary | ICD-10-CM | POA: Insufficient documentation

## 2023-02-04 DIAGNOSIS — Z800 Family history of malignant neoplasm of digestive organs: Secondary | ICD-10-CM | POA: Insufficient documentation

## 2023-02-04 DIAGNOSIS — K5900 Constipation, unspecified: Secondary | ICD-10-CM | POA: Insufficient documentation

## 2023-02-04 DIAGNOSIS — F1290 Cannabis use, unspecified, uncomplicated: Secondary | ICD-10-CM | POA: Insufficient documentation

## 2023-02-04 DIAGNOSIS — Z8042 Family history of malignant neoplasm of prostate: Secondary | ICD-10-CM | POA: Diagnosis not present

## 2023-02-04 DIAGNOSIS — Z90710 Acquired absence of both cervix and uterus: Secondary | ICD-10-CM | POA: Diagnosis not present

## 2023-02-04 DIAGNOSIS — I10 Essential (primary) hypertension: Secondary | ICD-10-CM | POA: Diagnosis not present

## 2023-02-04 DIAGNOSIS — Z79620 Long term (current) use of immunosuppressive biologic: Secondary | ICD-10-CM | POA: Insufficient documentation

## 2023-02-04 DIAGNOSIS — E032 Hypothyroidism due to medicaments and other exogenous substances: Secondary | ICD-10-CM | POA: Diagnosis not present

## 2023-02-04 LAB — CMP (CANCER CENTER ONLY)
ALT: 11 U/L (ref 0–44)
AST: 15 U/L (ref 15–41)
Albumin: 4.5 g/dL (ref 3.5–5.0)
Alkaline Phosphatase: 109 U/L (ref 38–126)
Anion gap: 10 (ref 5–15)
BUN: 15 mg/dL (ref 8–23)
CO2: 26 mmol/L (ref 22–32)
Calcium: 10.1 mg/dL (ref 8.9–10.3)
Chloride: 104 mmol/L (ref 98–111)
Creatinine: 0.87 mg/dL (ref 0.44–1.00)
GFR, Estimated: >60 mL/min (ref >60)
Glucose, Bld: 96 mg/dL (ref 70–99)
Potassium: 4.5 mmol/L (ref 3.5–5.1)
Sodium: 140 mmol/L (ref 135–145)
Total Bilirubin: 1.8 mg/dL — ABNORMAL HIGH (ref 0.0–1.2)
Total Protein: 7.5 g/dL (ref 6.5–8.1)

## 2023-02-04 LAB — CBC WITH DIFFERENTIAL (CANCER CENTER ONLY)
Abs Immature Granulocytes: 0.03 10*3/uL (ref 0.00–0.07)
Basophils Absolute: 0.2 10*3/uL — ABNORMAL HIGH (ref 0.0–0.1)
Basophils Relative: 2 %
Eosinophils Absolute: 1.7 10*3/uL — ABNORMAL HIGH (ref 0.0–0.5)
Eosinophils Relative: 16 %
HCT: 41.3 % (ref 36.0–46.0)
Hemoglobin: 14.2 g/dL (ref 12.0–15.0)
Immature Granulocytes: 0 %
Immature Platelet Fraction: 5.2 % (ref 1.2–8.6)
Lymphocytes Relative: 28 %
Lymphs Abs: 3 10*3/uL (ref 0.7–4.0)
MCH: 32.7 pg (ref 26.0–34.0)
MCHC: 34.4 g/dL (ref 30.0–36.0)
MCV: 95.2 fL (ref 80.0–100.0)
Monocytes Absolute: 0.9 10*3/uL (ref 0.1–1.0)
Monocytes Relative: 9 %
Neutro Abs: 4.8 10*3/uL (ref 1.7–7.7)
Neutrophils Relative %: 45 %
Platelet Count: 286 10*3/uL (ref 150–400)
RBC: 4.34 MIL/uL (ref 3.87–5.11)
RDW: 12.4 % (ref 11.5–15.5)
WBC Count: 10.7 10*3/uL — ABNORMAL HIGH (ref 4.0–10.5)
nRBC: 0 % (ref 0.0–0.2)
nRBC: 0 /100{WBCs}

## 2023-02-04 LAB — TSH: TSH: 10.59 u[IU]/mL — ABNORMAL HIGH (ref 0.350–4.500)

## 2023-02-04 MED ORDER — PEMBROLIZUMAB CHEMO INJECTION 100 MG/4ML
200.0000 mg | Freq: Once | INTRAVENOUS | Status: AC
  Administered 2023-02-04: 200 mg via INTRAVENOUS
  Filled 2023-02-04: qty 8

## 2023-02-04 MED ORDER — SODIUM CHLORIDE 0.9% FLUSH: 10.0000 mL | INTRAVENOUS | Status: DC | PRN

## 2023-02-04 MED ORDER — SODIUM CHLORIDE 0.9 % IV SOLN
INTRAVENOUS | Status: DC
Start: 2023-02-04 — End: 2023-02-04

## 2023-02-04 MED ORDER — PRENATAL VITAMIN 27-0.8 MG PO TABS: 1.0000 | ORAL_TABLET | Freq: Every day | ORAL | 5 refills | Status: AC

## 2023-02-04 MED ORDER — HEPARIN SOD (PORK) LOCK FLUSH 100 UNIT/ML IV SOLN
500.0000 [IU] | Freq: Once | INTRAVENOUS | Status: DC | PRN
Start: 2023-02-04 — End: 2023-02-04

## 2023-02-04 NOTE — Progress Notes (Deleted)
 [facility]  [address] 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  She was treated with left lower lobectomy in [place] .  Her doctor there recommended chemotherapy with immunotherapy, but she refused chemotherapy.  PD-L1 was positive.  She was only willing to take the immunotherapy for 1 year. She started pembrolizumab  in August 2023.  We saw her in October 2023, when she returned to [place]  and continued pembrolizumab  every 21 days for 1 year, completed in early September of 2024. Routine CT imaging in August was negative for recurrence. There were stable scattered small bilateral pulmonary nodules left, felt to be from benign emphysema Surgical changes were seen in the left lower lobe. She had a CT chest done on 01/13/2023 that revealed interval progressive in size and number of multiple bilateral pulmonary nodules most consistent with progression of metastatic disease. We have discussed options of treatment and she wishes not to pursue chemotherapy unless absolutely necessary. I have therefore recommended that she get back on Keytruda  and we will monitor her response.   Hypothyroidism due to medication She had been on levothyroxine  75 mcg daily since May.  TSH was suppressed in July, so levothyroxine  was decreased to 50 mcg daily.  Her TSH was elevated again in September, so I increased levothyroxine  back to 75 mcg daily.    Hypokalemia Resolved, and the patient is not taking her potassium supplement. I will instruct her if she needs to resume it.   Hyperbilirubinemia Significant increase in her bilirubin.  There was no evidence of hemolysis or biliary obstruction and this returned to normal by the following week. We asked her to discontinue  acetaminophen  and use meloxicam  prescribed for joint pain. We also instructed her to avoid alcohol. This rarely could be a late effect of the immunotherapy, so may need to consider steroid treatment. Repeat was completley normal within 1 week and is just mildly elevated today at 1.8. We will continue to monitor.   Plan: II will refill Synthroid  75 mcg daily. She informed me that she hasn't smoked in over 3 months and continues to abstain. She had a CT chest done on 01/13/2023 that revealed interval progressive in size and number of multiple bilateral pulmonary nodules most consistent with progression of metastatic disease, with the largest up to 7 mm, so not really accessible for biopsy or PET scan. There is one large pleural based plaque in the posteromedial left upper lobe measuring 2.9 cm.. She was previously treated with immunotherapy and I suggested restarting Keytruda , since this has occurred since it was stopped it 3 1/2 months ago. She inquired about how long would she have to be on Keytruda . I informed her the immunotherapy is palliative and not curative, so she will be on it until further notice as long as it keeps this controlled, unless she has unacceptable toxicities. She continues to decline chemotherapy as she feels this is too aggressive for her. She had labs done on 01/13/2023 with a WBC of 9.8, hemoglobin of 13.3, platelet count of 259,000, CEA of 4.2, and a normal CMP besides a elevated bilirubin of 1.8. On 11/28/2022 her bilirubin was elevated at 2.8 and returned to normal on 12/03/2022 at 1.0. She will return every 3 weeks with CBC, CMP, T4 and TSH and I  will see her back in 3 weeks after her first dose of Keytruda . She will be due for repeat scans in 3 months.I have answered her questions. The patient understands the plans discussed today and is in agreement with them.  She knows to contact our office if she develops concerns prior to her next appointment.   I provided 30 minutes of  face-to-face time during this encounter and > 50% was spent counseling as documented under my assessment and plan.    [name], MD [facility] A DEPT OF [facility] [address] [place] [address] Dept: [phone] Dept Fax: [phone]   No orders of the defined types were placed in this encounter.   CHIEF COMPLAINT:  CC: Stage IA2 adenocarcinoma of the lung   Current Treatment:  Observation   HISTORY OF PRESENT ILLNESS:  Sydney Hunt is a 63 y.o. female with a history of tobacco abuse who has adenocarcinoma of the left lung. This was originally found on lung cancer screening CT from September 2022. PET imaging was obtained in October which revealed a solitary 1.25 cm left lower lobe pulmonary nodule to be hypermetabolic and consistent with small primary lung neoplasm. No findings for mediastinal or hilar adenopathy or metastatic disease elsewhere was observed. She met with the genetic counselor and [facility] diagnostic testing was negative for any clinically significant mutations. CT chest from December revealed an enlarging left lower lobe nodule, measuring 1.4 x 1.9 cm. The additional smaller pulmonary nodules remained unchanged. She was referred to Dr. 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 Immunohistochemical stains were positive for TTF-1, and negative for p63 and CK5/6. 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 Her FEV1 was 1.98, which is 88% of predicted and her FVC was 2.58, which is 89% of predicted. She was deemed a candidate for resection and met with Dr. Shyrl for consultation. As she is not motivated to quit smoking, he felt that surgery would present an excessive risk for postoperative respiratory complications. He recommended SBRT as a good option if the patient was unable to  quit smoking.  The patient went for a second opinion to Dr. Lyle Pence and surgical resection was carried out in [place]  on April 12 with a left lower lobe lobectomy.  She was hospitalized for 13 days and has not smoked since that time.  Pathology revealed a 2.5 cm adenocarcinoma, grade 2.  There was no evidence of pleural invasion and margins are clear but she tells me 2 of 20 nodes were positive, for a T1c N1 M0, stage IIB.  A port was placed on August 16 and she was started on pembrolizumab  on September 7 and had her second dose on September 28. She refused chemotherapy. We recommended a full year of the pembrolizumab , which was completed in early September of 2024.  Oncology History  Non-small cell lung cancer, left (HCC)  01/15/2021 Initial Diagnosis   Non-small cell lung cancer, left (HCC)   02/12/2021 Cancer Staging   Staging form: Lung, AJCC 8th Edition - Clinical stage from 02/12/2021: Stage IA2 (cT1b, cN0, cM0) - Signed by Cornish Wanda VEAR, MD on 02/14/2021 Histopathologic type: Adenocarcinoma, NOS Stage prefix: Initial diagnosis Histologic grade (G): GX Histologic grading system: 4 grade system Laterality: Left Tumor size (mm): 19 Lymph-vascular invasion (LVI): LVI not present (absent)/not identified Diagnostic confirmation: Positive histology Specimen type: Bronchial  Biopsy Staged by: Managing physician Stage used in treatment planning: Yes National guidelines used in treatment planning: Yes Type of national guideline used in treatment planning: NCCN Staging comments: Rec surgical resection   05/11/2021 Cancer Staging   Staging form: Lung, AJCC 8th Edition - Pathologic stage from 05/11/2021: Stage IIB (ypT1c, pN1, cM0) - Signed by Cornelius Wanda DEL, MD on 11/12/2021 Histopathologic type: Adenocarcinoma, NOS Stage prefix: Post-therapy Histologic grade (G): G2 Histologic grading system: 4 grade system Residual tumor (R): R0 - None Laterality: Left Tumor size (mm):  25 Lymph-vascular invasion (LVI): LVI not present (absent)/not identified Diagnostic confirmation: Positive histology PLUS positive immunophenotyping and/or positive genetic studies Specimen type: Excision Staged by: Managing physician Type of lung cancer: Surgically resected non-small cell lung cancer ECOG performance status: Grade 1 Perineural invasion (PNI): Absent Pleural/elastic layer invasion: PL0 Pleural lavage cytology: Unknown Weight loss: Absent Adequacy of mediastinal dissection: Adequate Radiotherapy dose: No Adjuvant radiation: No Adjuvant chemotherapy: Yes Stage used in treatment planning: Yes National guidelines used in treatment planning: Yes Type of national guideline used in treatment planning: NCCN Staging comments: Adjuvant pembrolizumab , PDL 1 90%   10/04/2021 - 10/01/2022 Chemotherapy   Patient is on Treatment Plan : LUNG NSCLC Pembrolizumab  Adj q21d x 18 cycles     02/04/2023 -  Chemotherapy   Patient is on Treatment Plan : LUNG NSCLC Pembrolizumab  (200) q21d       INTERVAL HISTORY:  Zarielle is here today for repeat clinical assessment for stage IA2 adenocarcinoma of the lung. Patient states that she feels well and continues to have chronic pain. She inquired about coming off her Synthroid  medication and I advised she stay on this. I will refill Synthroid  75 mcg daily. She informed me that she hasn't smoked in over 3 months and continues to abstain. She had a CT chest done on 01/13/2023 that revealed interval progressive in size and number of multiple bilateral pulmonary nodules most consistent with progression of metastatic disease, with the largest up to 7 mm, so not really accessible for biopsy or PET scan. There is one large pleural based plaque in the posteromedial left upper lobe measuring 2.9 cm.. She was previously treated with immunotherapy and I suggested restarting Keytruda , since this has occurred since it was stopped it 3 1/2 months ago. She inquired about how  long would she have to be on Keytruda . I informed her the immunotherapy is palliative and not curative, so she will be on it until further notice as long as it keeps this controlled, unless she has unacceptable toxicities. She continues to decline chemotherapy as she feels this is too aggressive for her. She had labs done on 01/13/2023 with a WBC of 9.8, hemoglobin of 13.3, platelet count of 259,000, CEA of 4.2, and a normal CMP besides a elevated bilirubin of 1.8. On 11/28/2022 her bilirubin was elevated at 2.8 and returned to normal on 12/03/2022 at 1.0. She will return every 3 weeks with CBC, CMP, T4 and TSH and I will see her back in 3 weeks after her first dose of Keytruda . She will be due for repeat scans in 3 months.   She denies fevers or chills. She denies pain. Her appetite is not the best. Her weight has decreased 3 pounds over last 1.5 months .  REVIEW OF SYSTEMS:  Review of Systems  Constitutional:  Positive for appetite change (ok).  HENT:  Negative.    Eyes: Negative.   Respiratory: Negative.    Cardiovascular: Negative.   Gastrointestinal: Negative.  Endocrine: Negative.   Genitourinary: Negative.    Musculoskeletal:  Positive for arthralgias and myalgias.  Skin: Negative.   Neurological: Negative.   Hematological: Negative.   Psychiatric/Behavioral: Negative.       VITALS:  There were no vitals taken for this visit.  Wt Readings from Last 3 Encounters:  01/16/23 100 lb 14.4 oz (45.8 kg)  11/28/22 103 lb 3.2 oz (46.8 kg)  11/08/22 107 lb 6.4 oz (48.7 kg)    There is no height or weight on file to calculate BMI.  Performance status (ECOG): 1 - Symptomatic but completely ambulatory  PHYSICAL EXAM:  Physical Exam Vitals and nursing note reviewed.  Constitutional:      General: She is not in acute distress.    Appearance: Normal appearance. She is normal weight. She is not ill-appearing, toxic-appearing or diaphoretic.  HENT:     Head: Normocephalic and atraumatic.      Right Ear: Tympanic membrane, ear canal and external ear normal. There is no impacted cerumen.     Left Ear: Tympanic membrane, ear canal and external ear normal. There is no impacted cerumen.     Nose: Nose normal. No congestion or rhinorrhea.     Mouth/Throat:     Mouth: Mucous membranes are moist.     Pharynx: Oropharynx is clear. No oropharyngeal exudate or posterior oropharyngeal erythema.  Eyes:     General: No scleral icterus.    Extraocular Movements: Extraocular movements intact.     Conjunctiva/sclera: Conjunctivae normal.     Pupils: Pupils are equal, round, and reactive to light.  Neck:     Vascular: No carotid bruit.  Cardiovascular:     Rate and Rhythm: Normal rate and regular rhythm.     Pulses: Normal pulses.     Heart sounds: Normal heart sounds. No murmur heard.    No friction rub. No gallop.  Pulmonary:     Effort: Pulmonary effort is normal. No respiratory distress.     Breath sounds: Normal breath sounds. No wheezing or rales.  Abdominal:     General: Bowel sounds are normal. There is no distension.     Palpations: Abdomen is soft. There is no hepatomegaly, splenomegaly or mass.     Tenderness: There is no abdominal tenderness.  Musculoskeletal:        General: Normal range of motion.     Cervical back: Normal range of motion and neck supple. No rigidity or tenderness.     Right lower leg: No edema.     Left lower leg: No edema.  Lymphadenopathy:     Cervical: No cervical adenopathy.     Right cervical: No superficial, deep or posterior cervical adenopathy.    Left cervical: No superficial, deep or posterior cervical adenopathy.     Upper Body:     Right upper body: No supraclavicular, axillary or pectoral adenopathy.     Left upper body: No supraclavicular, axillary or pectoral adenopathy.  Skin:    General: Skin is warm and dry.  Neurological:     General: No focal deficit present.     Mental Status: She is alert and oriented to person, place, and  time. Mental status is at baseline.  Psychiatric:        Mood and Affect: Mood normal.        Behavior: Behavior normal.        Thought Content: Thought content normal.        Judgment: Judgment normal.     LABS:  Latest Ref Rng & Units 01/13/2023   12:00 AM 11/28/2022    9:43 AM 10/17/2022   10:50 AM  CBC  WBC  9.8     9.1  10.6   Hemoglobin 12.0 - 16.0 13.3     13.3  14.1   Hematocrit 36 - 46 38     40.1  43.2   Platelets 150 - 400 K/uL 259     268  290      This result is from an external source.      Latest Ref Rng & Units 01/13/2023   12:00 AM 12/03/2022   10:21 AM 11/28/2022    9:43 AM  CMP  Glucose 70 - 99 mg/dL  82  888   BUN 4 - 21 15     10  15    Creatinine 0.5 - 1.1 0.8     0.92  0.76   Sodium 137 - 147 139     144  140   Potassium 3.5 - 5.1 mEq/L 4.1     5.0  3.4   Chloride 99 - 108 108     107  102   CO2 13 - 22 25     27  27    Calcium 8.7 - 10.7 9.5     9.9  9.7   Total Protein 6.5 - 8.1 g/dL  7.0  7.3   Total Bilirubin <1.2 mg/dL  1.0  2.8   Alkaline Phos 25 - 125 70     79  58   AST 13 - 35 18     15  15    ALT 7 - 35 U/L 10     5  11       This result is from an external source.     Lab Results  Component Value Date   CEA1 5.3 (H) 10/17/2022   CEA 4.2 01/13/2023   /  CEA  Date Value Ref Range Status  01/13/2023 4.2  Final  10/17/2022 5.3 (H) 0.0 - 4.7 ng/mL Final    Comment:    (NOTE)                             Nonsmokers          <3.9                             Smokers             <5.6 Roche [employer] Electrochemiluminescence Immunoassay (ECLIA) Values obtained with different assay methods or kits cannot be used interchangeably.  Results cannot be interpreted as absolute evidence of the presence or absence of malignant disease. Performed At: [facility] [address] [place], [place] [id number] Jennette Shorter MD Ey:[phone]    No results found for: PSA1 No results found for: CAN199 No results found for:  CAN125  No results found for: TOTALPROTELP, ALBUMINELP, A1GS, A2GS, BETS, BETA2SER, GAMS, MSPIKE, SPEI No results found for: TIBC, FERRITIN, IRONPCTSAT Lab Results  Component Value Date   LDH 130 11/28/2022    STUDIES:  Exam: 01/13/2023 CT Chest with CM Impression: Interval progressive in size and number of multiple bilateral pulmonary nodules. These findings are most consistent with progression of metastatic disease.     HISTORY:   Past Medical History:  Diagnosis Date   Allergy    Arthritis    Atherosclerotic vascular disease 01/16/2022  Cardiac murmur 01/16/2022   Complication of anesthesia    slow to wake up   COPD (chronic obstructive pulmonary disease) (HCC)    COPD, mild (HCC) 12/15/2020   DOE (dyspnea on exertion) 01/16/2022   Family history of pancreatic cancer 11/28/2020   Family history of prostate cancer 11/28/2020   Family history of stomach cancer 11/28/2020   Genetic testing 12/18/2020   [facility] Multi-Cancer Panel was Negative. Report date is 12/12/2020.     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   Leukocytosis 01/17/2022   Migraines    Nodule of left lung 11/21/2020   Non-small cell lung cancer, left (HCC) 01/15/2021   Port-A-Cath in place 09/05/2022   Tobacco use 12/07/2020    Past Surgical History:  Procedure Laterality Date   ABDOMINAL HYSTERECTOMY  1989   unsure of vaginal or abdominal   BRONCHIAL BIOPSY  01/15/2021    Procedure: BRONCHIAL BIOPSIES;  Surgeon: Shelah Lamar RAMAN, MD;  Location: [facility] END[facility]COPY;  Service: Pulmonary;;   BRONCHIAL BRUSHINGS  01/15/2021   Procedure: BRONCHIAL BRUSHINGS;  Surgeon: Shelah Lamar RAMAN, MD;  Location: [facility] ENDOSCOPY;  Service: Pulmonary;;   BRONCHIAL NEEDLE ASPIRATION BIOPSY  01/15/2021   Procedure: BRONCHIAL NEEDLE ASPIRATION BIOPSIES;  Surgeon: Shelah Lamar RAMAN, MD;  Location: [facility] ENDOSCOPY;  Service: Pulmonary;;   COLONOSCOPY  2022   FIDUCIAL MARKER PLACEMENT  01/15/2021   Procedure: FIDUCIAL MARKER PLACEMENT;  Surgeon: Shelah Lamar RAMAN, MD;  Location: [facility] ENDOSCOPY;  Service: Pulmonary;;   ovaries removed  1986   surgery to remove scar tissue     from colon/bladder in patient's late 30's   TUBAL LIGATION  1986   UPPER GI ENDOSCOPY     years ago in patient's late 61s   VIDEO BRONCHOSCOPY WITH RADIAL ENDOBRONCHIAL ULTRASOUND  01/15/2021   Procedure: VIDEO BRONCHOSCOPY WITH RADIAL ENDOBRONCHIAL ULTRASOUND;  Surgeon: Shelah Lamar RAMAN, MD;  Location: [facility] ENDOSCOPY;  Service: Pulmonary;;    Family History  Problem Relation Age of Onset   Pancreatic cancer Maternal Aunt 50   Brain cancer Maternal Uncle    Stomach cancer Maternal Uncle        dx. >50   Prostate cancer Half-Brother        passed away at 10   Prostate cancer Half-Brother     Social History:  reports that she quit smoking about 21 months ago. Her smoking use included cigarettes. She started smoking about 62 years ago. She has a 30.1 pack-year smoking history. She has never used smokeless tobacco. She reports current alcohol use of about 4.0 standard drinks of alcohol per week. She reports current drug use. Frequency: 3.00 times per week. Drug: Marijuana.The patient is alone today.  Allergies:  Allergies  Allergen Reactions   Codeine Rash   Demerol [Meperidine Hcl] Other (See Comments)    coma   Meperidine Other (See Comments)    Several days will pass without patient being aware   Other Dermatitis     Stainless steel / skin weeps   Silver Dermatitis    Sterling silver skin weeps  Gold Bond [Menthol-Zinc Oxide] Dermatitis    Skin weeps   Neosporin [Bacitracin-Polymyxin B] Dermatitis    Skin weeps   Wound Dressing Adhesive Other (See Comments)    CAUSES SKIN TEARS PER PATIENT. INSTEAD USE THE TEGADERM 60M WITH ADHESIVE FREE WINDOW OVER PORT.    Current Medications: Current Outpatient Medications  Medication Sig Dispense Refill   acetaminophen  (TYLENOL ) 500 MG tablet Take 500 mg by mouth as needed for moderate pain or mild pain.     albuterol  (VENTOLIN  HFA) 108 (90 Base) MCG/ACT inhaler Inhale 2 puffs into the lungs every 4 (four) hours as needed for wheezing or shortness of breath.     amitriptyline (ELAVIL) 10 MG tablet Take 10 mg by mouth at bedtime as needed for sleep.     aspirin 81 MG EC tablet Take 81 mg by mouth daily.     atorvastatin (LIPITOR) 40 MG tablet Take 40 mg by mouth daily.     Biotin 5000 MCG CAPS Take 5,000 mcg by mouth daily.     Collagen-Vitamin C (SUPER COLLAGEN PLUS VITAMIN C) 1000-10 MG TABS Take 1 tablet by mouth daily.     fluticasone (FLONASE) 50 MCG/ACT nasal spray Place 1 spray into both nostrils daily.     HYDROcodone -acetaminophen  (NORCO/VICODIN) 5-325 MG tablet Take 1 tablet by mouth every 6 (six) hours as needed for moderate pain or severe pain. 30 tablet 0   levothyroxine  (SYNTHROID ) 75 MCG tablet Take 1 tablet (75 mcg total) by mouth daily. 30 tablet 5   lidocaine -prilocaine  (EMLA ) cream Apply 1 Application topically as needed. 30 g 0   meloxicam  (MOBIC ) 7.5 MG tablet Take 1 tablet (7.5 mg total) by mouth daily. 30 tablet 0   Menaquinone-7 (VITAMIN K2) 100 MCG CAPS Take 1 capsule by mouth daily.     Multiple Vitamins-Minerals (CENTRUM MINIS WOMEN 50+ PO) Take 1 tablet by mouth daily.     ondansetron  (ZOFRAN ) 4 MG tablet Take 1 tablet (4 mg total) by mouth every 4 (four) hours as needed for nausea. 90 tablet 3   pantoprazole  (PROTONIX ) 40 MG tablet  Take 1 tablet (40 mg total) by mouth daily. 60 tablet 2   potassium chloride  SA (KLOR-CON  M) 20 MEQ tablet Take 3 tablets (60 mEq total) by mouth 2 (two) times daily. Taking 2 tablets in the morning and 2 tablets in the afternoon (Patient taking differently: Take 60 mEq by mouth 2 (two) times daily. Patient voiced taking one tablet in morning and two tablets in the evening.) 180 tablet 3   prochlorperazine  (COMPAZINE ) 10 MG tablet TAKE 1 TABLET(10 MG) BY MOUTH EVERY 6 HOURS AS NEEDED FOR NAUSEA OR VOMITING 360 tablet 0   traZODone (DESYREL) 50 MG tablet Take 25 mg by mouth at bedtime as needed for sleep. (Patient not taking: Reported on 11/28/2022)     vitamin E 180 MG (400 UNITS) capsule Take 400 Units by mouth daily. (Patient not taking: Reported on 11/28/2022)     No current facility-administered medications for this visit.     I,Jasmine M Lassiter,acting as a scribe for [name], MD.,have documented all relevant documentation on the behalf of [name], MD,as directed by  [name], MD while in the presence of [name], MD.

## 2023-02-04 NOTE — Progress Notes (Signed)
 Verbal order per Dr Gilman Buttner, [facility] to treat today with total bilirubin of 1.8

## 2023-02-04 NOTE — Patient Instructions (Signed)

## 2023-02-04 NOTE — Progress Notes (Addendum)
 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  She was treated with left lower lobectomy in [place] .  Her doctor there recommended chemotherapy with immunotherapy, but she refused chemotherapy.  PD-L1 was positive.  She was only willing to take the immunotherapy for 1 year. She started pembrolizumab  in August 2023.  We saw her in October 2023, when she returned to [place]  and continued pembrolizumab  every 21 days for 1 year, completed in early September of 2024. Routine CT imaging in August was negative for recurrence. There were stable scattered small bilateral pulmonary nodules left, felt to be from benign emphysema Surgical changes were seen in the left lower lobe. She had a CT chest done on 01/13/2023 that revealed interval progressive in size and number of multiple bilateral pulmonary nodules most consistent with progression of metastatic disease. We have discussed options of treatment and she wishes not to pursue chemotherapy unless absolutely necessary.  I will request her Tempus testing from [place]  that was done at the time of her left lower lobectomy.  I have therefore recommended that she get back on Keytruda  and we will start that January 7th, 2025.  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  These measure up to 7 mm in diameter and she does have an area of pleural plaque in the left upper lobe, posterior medial, measuring 2.9 cm.  We know that her prior PD-L1 was positive at 90% and so we had started her back on Keytruda  as of January 2025.  Hypothyroidism due to  medication She had been on levothyroxine  75 mcg daily since May.  TSH was suppressed in July, so levothyroxine  was decreased to 50 mcg daily.  Her TSH was elevated again in September, 2024, so I increased levothyroxine  back to 75 mcg daily.    Hypokalemia Resolved, and the patient is not taking her potassium supplement. She has resumed this and her level is great at 4.5.   Hyperbilirubinemia Significant increase in her bilirubin.  There was no evidence of hemolysis or biliary obstruction and this returned to normal by the following week. We asked her to discontinue acetaminophen  and use meloxicam  prescribed for joint pain. We also instructed her to avoid alcohol. This rarely could be a late effect of the immunotherapy, so may need to consider steroid treatment. Repeat was completley normal within 1 week and is just mildly elevated today at 1.8. We will continue to monitor. 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 If it persists we can check her UGT1A1. I think we are fine to proceed with treatment.   Plan: She informed me that she started smoking again after finding out the results of her last CT scan. We reviewed that information again today. Patient states she will try to stop again and I urged her to do so. Her day 1 cycle 1 of Pembrolizumab  is scheduled on 02/04/2023. I informed her of the Tempus test as another option after immunotherapy. She has already had that done so we will track down the results from [place] . She has a mildly elevated WBC of 10.7 with 1.7% eosinophils, hemoglobin of 14.2,  and platelet count of 286,000. Her CMP is normal other than a slight elevated total bilirubin of 1.8. Her TSH and T4 levels today are pending.  She continues oral potassium 20meq once daily and her level is great at 4.5. I will order prenatal vitamins to take once daily. I will see her back in 3 weeks with CBC, CMP, TSH, and T4. I called her daughter at her request to review the situation.   Sydney Hunt [phone])  She has offered to assist this in obtaining the Tempus testing. I have answered her questions. The patient understands the plans discussed today and is in agreement with them.  She knows to contact our office if she develops concerns prior to her next appointment.  I provided 23 minutes of face-to-face time during this encounter and > 50% was spent counseling as documented under my assessment and plan.   Sydney VEAR Cornish, MD [facility] DE[facility] OF [facility] [address] [place] [address] Dept: [phone] Dept Fax: [phone]   No orders of the defined types were placed in this encounter.   CHIEF COMPLAINT:  CC: Stage IA2 adenocarcinoma of the lung   Current Treatment:  Observation   HISTORY OF PRESENT ILLNESS:  Sydney Hunt is a 63 y.o. female with a history of tobacco abuse who has adenocarcinoma of the left lung. This was originally found on lung cancer screening CT from September 2022. PET imaging was obtained in October which revealed a solitary 1.25 cm left lower lobe pulmonary nodule to be hypermetabolic and consistent with small primary lung neoplasm. No findings for mediastinal or hilar adenopathy or metastatic disease elsewhere was observed. She met with the genetic counselor and [facility] diagnostic testing was negative for any clinically significant mutations. CT chest from December revealed an enlarging left lower lobe nodule, measuring 1.4 x 1.9 cm. The additional smaller pulmonary nodules remained unchanged. She was referred to Dr. 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 Immunohistochemical stains were positive for TTF-1, and negative for p63 and CK5/6. 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 Her FEV1 was 1.98, which is 88% of predicted and her FVC was  2.58, which is 89% of predicted. She was deemed a candidate for resection and met with Dr. Shyrl for consultation. As she is not motivated to quit smoking, he felt that surgery would present an excessive risk for postoperative respiratory complications. He recommended SBRT as a good option if the patient was unable to quit smoking.  The patient went for a second opinion to Dr. Lyle Pence and surgical resection was carried out in [place]  on April 12 with a left lower lobe lobectomy.  She was hospitalized for 13 days and has not smoked since that time.  Pathology revealed a 2.5 cm adenocarcinoma, grade 2.  There was no evidence of pleural invasion and margins are clear but she tells me 2 of 20 nodes were positive, for a T1c N1 M0, stage IIB.  A port was placed on August 16 and she was started on pembrolizumab  on September 7 and had her second dose on September 28. She refused chemotherapy. We recommended a full year of the pembrolizumab , which was completed in early September of 2024.  Oncology History  Non-small cell lung cancer, left (HCC)  01/15/2021 Initial Diagnosis   Non-small cell lung cancer, left (HCC)   02/12/2021  Cancer Staging   Staging form: Lung, AJCC 8th Edition - Clinical stage from 02/12/2021: Stage IA2 (cT1b, cN0, cM0) - Signed by Cornelius Sydney DEL, MD on 02/14/2021 Histopathologic type: Adenocarcinoma, NOS Stage prefix: Initial diagnosis Histologic grade (G): GX Histologic grading system: 4 grade system Laterality: Left Tumor size (mm): 19 Lymph-vascular invasion (LVI): LVI not present (absent)/not identified Diagnostic confirmation: Positive histology Specimen type: Bronchial Biopsy Staged by: Managing physician Stage used in treatment planning: Yes National guidelines used in treatment planning: Yes Type of national guideline used in treatment planning: NCCN Staging comments: Rec surgical resection   05/11/2021 Cancer Staging   Staging form: Lung, AJCC 8th  Edition - Pathologic stage from 05/11/2021: Stage IIB (ypT1c, pN1, cM0) - Signed by Cornelius Sydney DEL, MD on 11/12/2021 Histopathologic type: Adenocarcinoma, NOS Stage prefix: Post-therapy Histologic grade (G): G2 Histologic grading system: 4 grade system Residual tumor (R): R0 - None Laterality: Left Tumor size (mm): 25 Lymph-vascular invasion (LVI): LVI not present (absent)/not identified Diagnostic confirmation: Positive histology PLUS positive immunophenotyping and/or positive genetic studies Specimen type: Excision Staged by: Managing physician Type of lung cancer: Surgically resected non-small cell lung cancer ECOG performance status: Grade 1 Perineural invasion (PNI): Absent Pleural/elastic layer invasion: PL0 Pleural lavage cytology: Unknown Weight loss: Absent Adequacy of mediastinal dissection: Adequate Radiotherapy dose: No Adjuvant radiation: No Adjuvant chemotherapy: Yes Stage used in treatment planning: Yes National guidelines used in treatment planning: Yes Type of national guideline used in treatment planning: NCCN Staging comments: Adjuvant pembrolizumab , PDL 1 90%   10/04/2021 - 10/01/2022 Chemotherapy   Patient is on Treatment Plan : LUNG NSCLC Pembrolizumab  Adj q21d x 18 cycles     02/04/2023 -  Chemotherapy   Patient is on Treatment Plan : LUNG NSCLC Pembrolizumab  (200) q21d       INTERVAL HISTORY:  Sydney Hunt is here today for repeat clinical assessment for stage IA2 adenocarcinoma of the lung. Patient states that she feels ok and complains of a cough. She tries not to over exert herself and tends to stay in the house. She informed me that she started smoking again after finding out the results of her last CT scan, which revealed multiple bilateral pulmonary nodules measuring up to 7 mm in addition to a pleural-based plaque in the left upper lobe measuring 2.9 cm. We reviewed that information again today and I answered her questions. Patient states she will try to  stop smoking again and I urged her to do so. Her day 1 cycle 1 of Pembrolizumab  is scheduled on 02/04/2023. I tracked down her Tempus test which was done in [place]  after her left lower lobectomy.  I know the PD-L1 was 90% but I would like to review the report myself to see if she will have any other options after immunotherapy. She has already had that done so we will track down the results from [place] . She has a mildly elevated WBC of 10.7 with 1.7% eosinophils, hemoglobin of 14.2, and platelet count of 286,000. Her CMP is normal other than a slight elevated total bilirubin of 1.8. Her TSH and T4 levels today are pending.  She continues oral potassium 20meq once daily and her level is great at 4.5. I will order prenatal vitamins to take once daily. I will see her back in 3 weeks with CBC, CMP, TSH, and T4. I called her daughter at her request to review this information and answer her questions. She denies signs of infection such as sore throat, sinus drainage, or urinary symptoms.  She denies fevers or recurrent chills. She denies pain. She denies nausea, vomiting, chest pain, dyspnea. Her appetite is ok and her weight has increased 1 pounds over last 2.5 weeks .  REVIEW OF SYSTEMS:  Review of Systems  Constitutional:  Positive for appetite change (ok). Negative for chills, diaphoresis, fatigue, fever and unexpected weight change.  HENT:  Negative.  Negative for hearing loss, lump/mass, mouth sores, nosebleeds, sore throat, tinnitus, trouble swallowing and voice change.   Eyes: Negative.  Negative for eye problems and icterus.  Respiratory:  Positive for cough. Negative for chest tightness, hemoptysis, shortness of breath and wheezing.   Cardiovascular: Negative.  Negative for chest pain, leg swelling and palpitations.  Gastrointestinal: Negative.  Negative for abdominal distention, abdominal pain, blood in stool, constipation, diarrhea, nausea, rectal pain and vomiting.  Endocrine: Negative.    Genitourinary: Negative.  Negative for bladder incontinence, difficulty urinating, dyspareunia, dysuria, frequency, hematuria, menstrual problem, nocturia, pelvic pain, vaginal bleeding and vaginal discharge.   Musculoskeletal:  Positive for arthralgias and myalgias. Negative for back pain, flank pain, gait problem, neck pain and neck stiffness.  Skin: Negative.   Neurological: Negative.  Negative for dizziness, extremity weakness, gait problem, headaches, light-headedness, numbness, seizures and speech difficulty.  Hematological: Negative.  Negative for adenopathy. Does not bruise/bleed easily.  Psychiatric/Behavioral: Negative.  Negative for confusion, decreased concentration, depression, sleep disturbance and suicidal ideas. The patient is not nervous/anxious.      VITALS:  Blood pressure (!) 150/79, pulse 69, temperature 98 F (36.7 C), temperature source Oral, resp. rate 19, height 4' 11 (1.499 m), weight 101 lb (45.8 kg), SpO2 100%.  Wt Readings from Last 3 Encounters:  02/04/23 101 lb (45.8 kg)  01/16/23 100 lb 14.4 oz (45.8 kg)  11/28/22 103 lb 3.2 oz (46.8 kg)    Body mass index is 20.4 kg/m.  Performance status (ECOG): 1 - Symptomatic but completely ambulatory  PHYSICAL EXAM:  Physical Exam Vitals and nursing note reviewed.  Constitutional:      General: She is not in acute distress.    Appearance: Normal appearance. She is normal weight. She is not ill-appearing, toxic-appearing or diaphoretic.  HENT:     Head: Normocephalic and atraumatic.     Right Ear: Tympanic membrane, ear canal and external ear normal. There is no impacted cerumen.     Left Ear: Tympanic membrane, ear canal and external ear normal. There is no impacted cerumen.     Nose: Nose normal. No congestion or rhinorrhea.     Mouth/Throat:     Mouth: Mucous membranes are moist.     Pharynx: Oropharynx is clear. No oropharyngeal exudate or posterior oropharyngeal erythema.  Eyes:     General: No scleral  icterus.       Right eye: No discharge.        Left eye: No discharge.     Extraocular Movements: Extraocular movements intact.     Conjunctiva/sclera: Conjunctivae normal.     Pupils: Pupils are equal, round, and reactive to light.  Neck:     Vascular: No carotid bruit.  Cardiovascular:     Rate and Rhythm: Normal rate and regular rhythm.     Pulses: Normal pulses.     Heart sounds: Normal heart sounds. No murmur heard.    No friction rub. No gallop.  Pulmonary:     Effort: Pulmonary effort is normal. No respiratory distress.     Breath sounds: Normal breath sounds. No stridor. No wheezing, rhonchi or rales.  Chest:     Chest wall: No tenderness.  Abdominal:     General: Bowel sounds are normal. There is no distension.     Palpations: Abdomen is soft. There is no hepatomegaly, splenomegaly or mass.     Tenderness: There is no abdominal tenderness. There is no right CVA tenderness, left CVA tenderness, guarding or rebound.     Hernia: No hernia is present.  Musculoskeletal:        General: No swelling, tenderness, deformity or signs of injury. Normal range of motion.     Cervical back: Normal range of motion and neck supple. No rigidity or tenderness.     Right lower leg: No edema.     Left lower leg: No edema.  Lymphadenopathy:     Cervical: No cervical adenopathy.     Right cervical: No superficial, deep or posterior cervical adenopathy.    Left cervical: No superficial, deep or posterior cervical adenopathy.     Upper Body:     Right upper body: No supraclavicular, axillary or pectoral adenopathy.     Left upper body: No supraclavicular, axillary or pectoral adenopathy.  Skin:    General: Skin is warm and dry.     Coloration: Skin is not jaundiced or pale.     Findings: No bruising, erythema, lesion or rash.  Neurological:     General: No focal deficit present.     Mental Status: She is alert and oriented to person, place, and time. Mental status is at baseline.      Cranial Nerves: No cranial nerve deficit.     Sensory: No sensory deficit.     Motor: No weakness.     Coordination: Coordination normal.     Gait: Gait normal.     Deep Tendon Reflexes: Reflexes normal.  Psychiatric:        Mood and Affect: Mood normal.        Behavior: Behavior normal.        Thought Content: Thought content normal.        Judgment: Judgment normal.     LABS:      Latest Ref Rng & Units 02/04/2023    9:07 AM 01/13/2023   12:00 AM 11/28/2022    9:43 AM  CBC  WBC 4.0 - 10.5 K/uL 10.7  9.8     9.1   Hemoglobin 12.0 - 15.0 g/dL 85.7  86.6     86.6   Hematocrit 36.0 - 46.0 % 41.3  38     40.1   Platelets 150 - 400 K/uL 286  259     268      This result is from an external source.      Latest Ref Rng & Units 02/04/2023    9:07 AM 01/13/2023   12:00 AM 12/03/2022   10:21 AM  CMP  Glucose 70 - 99 mg/dL 96   82   BUN 8 - 23 mg/dL 15  15     10    Creatinine 0.44 - 1.00 mg/dL 9.12  0.8     9.07   Sodium 135 - 145 mmol/L 140  139     144   Potassium 3.5 - 5.1 mmol/L 4.5  4.1     5.0   Chloride 98 - 111 mmol/L 104  108     107   CO2 22 - 32 mmol/L 26  25     27    Calcium 8.9 - 10.3 mg/dL 89.8  9.5  9.9   Total Protein 6.5 - 8.1 g/dL 7.5   7.0   Total Bilirubin 0.0 - 1.2 mg/dL 1.8   1.0   Alkaline Phos 38 - 126 U/L 109  70     79   AST 15 - 41 U/L 15  18     15    ALT 0 - 44 U/L 11  10     5       This result is from an external source.   Lab Results  Component Value Date   CEA1 5.3 (H) 10/17/2022   CEA 4.2 01/13/2023   /  CEA  Date Value Ref Range Status  01/13/2023 4.2  Final  10/17/2022 5.3 (H) 0.0 - 4.7 ng/mL Final    Comment:    (NOTE)                             Nonsmokers          <3.9                             Smokers             <5.6 Roche [employer] Electrochemiluminescence Immunoassay (ECLIA) Values obtained with different assay methods or kits cannot be used interchangeably.  Results cannot be interpreted as absolute evidence of the  presence or absence of malignant disease. Performed At: [facility] [address] [place], [place] [id number] Jennette Shorter MD Ey:[phone]    Lab Results  Component Value Date   TSH 10.590 (H) 02/04/2023   T4TOTAL 7.4 02/04/2023   No results found for: PSA1 No results found for: CAN199 No results found for: CAN125  No results found for: TOTALPROTELP, ALBUMINELP, A1GS, A2GS, BETS, BETA2SER, GAMS, MSPIKE, SPEI No results found for: TIBC, FERRITIN, IRONPCTSAT Lab Results  Component Value Date   LDH 130 11/28/2022    STUDIES:  Exam: 01/13/2023 CT Chest with CM Impression: Interval progressive in size and number of multiple bilateral pulmonary nodules. These findings are most consistent with progression of metastatic disease.  The nodules have increased in number and size when compared to the previous report of September 24, 2022.  The nodules are up to 7 mm in diameter. She also has a pleural-based plaque of the left upper lobe posterior medial measuring 2.9 cm. There are postsurgical changes consistent with a left lower lobectomy. Left Port-A-Cath terminates in the distal superior vena cava.    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 Report date is 12/12/2020.     The Multi-Cancer + RNA Panel offered by [facility] includes sequencing and/or deletion/duplication analysis of the following 84 genes:  AIP*, ALK, APC*, ATM*, AXIN2*, BAP1*, BARD1*, BLM*,  BMPR1A*, BRCA1*, BRCA2*, BRIP1*, CASR, CDC73*, CDH1*, CDK4, CDKN1B*, CDKN1C*, CDKN2A, CEBPA, CHEK2*, CTNNA1*, DICER1*, DIS3L2*, EGFR, EPCAM, FH   GERD (gastroesophageal reflux disease)    Heartburn 12/27/2021  Hormone disorder 11/20/2020   Hyperbilirubinemia 09/26/2022   Hypertension    Hypokalemia 12/27/2021   Hypothyroidism due to medication 01/22/2022   Leukocytosis 01/17/2022   Migraines    Nodule of left lung 11/21/2020   Non-small cell lung cancer, left (HCC) 01/15/2021   Port-A-Cath in place 09/05/2022   Tobacco use 12/07/2020    Past Surgical History:  Procedure Laterality Date   ABDOMINAL HYSTERECTOMY  1989   unsure of vaginal or abdominal   BRONCHIAL BIOPSY  01/15/2021   Procedure: BRONCHIAL BIOPSIES;  Surgeon: Shelah Lamar RAMAN, MD;  Location: [facility] ENDOSCOPY;  Service: Pulmonary;;   BRONCHIAL BRUSHINGS  01/15/2021   Procedure: BRONCHIAL BRUSHINGS;  Surgeon: Shelah Lamar RAMAN, MD;  Location: [facility] ENDOSCOPY;  Service: Pulmonary;;   BRONCHIAL NEEDLE ASPIRATION BIOPSY  01/15/2021   Procedure: BRONCHIAL NEEDLE ASPIRATION BIOPSIES;  Surgeon: Shelah Lamar RAMAN, MD;  Location: [facility] ENDOSCOPY;  Service: Pulmonary;;   COLONOSCOPY  2022   FIDUCIAL MARKER PLACEMENT  01/15/2021   Procedure: FIDUCIAL MARKER PLACEMENT;  Surgeon: Shelah Lamar RAMAN, MD;  Location: [facility] ENDOSCOPY;  Service: Pulmonary;;   ovaries removed  1986   surgery to remove scar tissue     from colon/bladder in patient's late 30's   TUBAL LIGATION  1986   UPPER GI ENDOSCOPY     years ago in patient's late 67s   VIDEO BRONCHOSCOPY WITH RADIAL ENDOBRONCHIAL ULTRASOUND  01/15/2021   Procedure: VIDEO BRONCHOSCOPY WITH RADIAL ENDOBRONCHIAL ULTRASOUND;  Surgeon: Shelah Lamar RAMAN, MD;  Location: [facility] ENDOSCOPY;  Service: Pulmonary;;    Family History  Problem Relation Age of Onset   Pancreatic cancer Maternal Aunt 50   Brain cancer Maternal Uncle    Stomach cancer Maternal Uncle        dx. >50   Prostate cancer Half-Brother         passed away at 44   Prostate cancer Half-Brother     Social History:  reports that she quit smoking about 21 months ago. Her smoking use included cigarettes. She started smoking about 62 years ago. She has a 30.1 pack-year smoking history. She has never used smokeless tobacco. She reports current alcohol use of about 4.0 standard drinks of alcohol per week. She reports current drug use. Frequency: 3.00 times per week. Drug: Marijuana.The patient is alone today.  Allergies:  Allergies  Allergen Reactions   Codeine Rash   Demerol [Meperidine Hcl] Other (See Comments)    coma   Meperidine Other (See Comments)    Several days will pass without patient being aware   Other Dermatitis    Stainless steel / skin weeps   Silver Dermatitis    Sterling silver skin weeps   Gold Bond [Menthol-Zinc Oxide] Dermatitis    Skin weeps   Neosporin [Bacitracin-Polymyxin B] Dermatitis    Skin weeps   Wound Dressing Adhesive Other (See Comments)    CAUSES SKIN TEARS PER PATIENT. INSTEAD USE THE TEGADERM 84M WITH ADHESIVE FREE WINDOW OVER PORT.    Current Medications: Current Outpatient Medications  Medication Sig Dispense Refill   acetaminophen  (TYLENOL ) 500 MG tablet Take 500 mg by mouth as needed for moderate pain or mild pain.     albuterol  (VENTOLIN  HFA) 108 (90 Base) MCG/ACT inhaler Inhale 2 puffs into the lungs every 4 (four) hours as needed for wheezing or shortness of breath.     amitriptyline (ELAVIL) 10 MG tablet Take 10 mg by mouth at bedtime as needed for sleep.     aspirin 81 MG EC  tablet Take 81 mg by mouth daily.     atorvastatin (LIPITOR) 40 MG tablet Take 40 mg by mouth daily.     Biotin 5000 MCG CAPS Take 5,000 mcg by mouth daily.     Collagen-Vitamin C (SUPER COLLAGEN PLUS VITAMIN C) 1000-10 MG TABS Take 1 tablet by mouth daily.     fluticasone (FLONASE) 50 MCG/ACT nasal spray Place 1 spray into both nostrils daily.     HYDROcodone -acetaminophen  (NORCO/VICODIN) 5-325 MG tablet Take 1  tablet by mouth every 6 (six) hours as needed for moderate pain or severe pain. 30 tablet 0   levothyroxine  (SYNTHROID ) 75 MCG tablet Take 1 tablet (75 mcg total) by mouth daily. 30 tablet 5   lidocaine -prilocaine  (EMLA ) cream Apply 1 Application topically as needed. 30 g 0   meloxicam  (MOBIC ) 7.5 MG tablet Take 1 tablet (7.5 mg total) by mouth daily. 30 tablet 0   Menaquinone-7 (VITAMIN K2) 100 MCG CAPS Take 1 capsule by mouth daily.     ondansetron  (ZOFRAN ) 4 MG tablet Take 1 tablet (4 mg total) by mouth every 4 (four) hours as needed for nausea. 90 tablet 3   pantoprazole  (PROTONIX ) 40 MG tablet Take 1 tablet (40 mg total) by mouth daily. 60 tablet 2   potassium chloride  SA (KLOR-CON  M) 20 MEQ tablet Take 3 tablets (60 mEq total) by mouth 2 (two) times daily. Taking 2 tablets in the morning and 2 tablets in the afternoon (Patient taking differently: Take 60 mEq by mouth 2 (two) times daily. Patient voiced taking one tablet in morning and two tablets in the evening.) 180 tablet 3   Prenatal Vit-Fe Fumarate-FA (PRENATAL VITAMIN) 27-0.8 MG TABS Take 1 tablet by mouth daily. 30 tablet 5   prochlorperazine  (COMPAZINE ) 10 MG tablet TAKE 1 TABLET(10 MG) BY MOUTH EVERY 6 HOURS AS NEEDED FOR NAUSEA OR VOMITING 360 tablet 0   traZODone (DESYREL) 50 MG tablet Take 25 mg by mouth at bedtime as needed for sleep. (Patient not taking: Reported on 11/28/2022)     vitamin E 180 MG (400 UNITS) capsule Take 400 Units by mouth daily. (Patient not taking: Reported on 11/28/2022)     No current facility-administered medications for this visit.     I,Jasmine M Lassiter,acting as a scribe for Sydney VEAR Cornish, MD.,have documented all relevant documentation on the behalf of Sydney VEAR Cornish, MD,as directed by  Sydney VEAR Cornish, MD while in the presence of Sydney VEAR Cornish, MD.

## 2023-02-05 ENCOUNTER — Other Ambulatory Visit: Payer: Self-pay

## 2023-02-05 LAB — T4: T4, Total: 7.4 ug/dL (ref 4.5–12.0)

## 2023-02-06 ENCOUNTER — Encounter: Payer: Self-pay | Admitting: Oncology

## 2023-02-11 ENCOUNTER — Encounter: Payer: Self-pay | Admitting: Oncology

## 2023-02-11 DIAGNOSIS — C7800 Secondary malignant neoplasm of unspecified lung: Secondary | ICD-10-CM | POA: Insufficient documentation

## 2023-02-11 HISTORY — DX: Secondary malignant neoplasm of unspecified lung: C78.00

## 2023-02-13 ENCOUNTER — Encounter: Payer: Self-pay | Admitting: Oncology

## 2023-02-13 ENCOUNTER — Other Ambulatory Visit: Payer: Self-pay

## 2023-02-19 NOTE — Progress Notes (Signed)
[facility]  [address] 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  She was treated with left lower lobectomy in [place].  Her doctor there recommended chemotherapy with immunotherapy, but she refused chemotherapy.  PD-L1 was positive.  She was only willing to take the immunotherapy for 1 year. She started pembrolizumab in August 2023.  We saw her in October 2023, when she returned to [place] and continued pembrolizumab every 21 days for 1 year, completed in early September of 2024. Routine CT imaging in August was negative for recurrence. There were stable scattered small bilateral pulmonary nodules left, felt to be from benign emphysema Surgical changes were seen in the left lower lobe. She had a CT chest done on 01/13/2023 that revealed interval progressive in size and number of multiple bilateral pulmonary nodules most consistent with progression of metastatic disease. We have discussed options of treatment and she wishes not to pursue chemotherapy unless absolutely necessary and we decided not to pursue a biopsy in view of the difficulty involved. I will request her Tempus testing from [place] that was done at the time of her left lower lobectomy.  I have therefore started her back on Keytruda on January 7th, 2025.  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  These measure up to 7 mm in diameter and she does have an area of pleural plaque in the left upper lobe, posterior medial, measuring 2.9 cm.  We know that her prior PD-L1 was positive at 90% and so we had started her back on Keytruda as of January  2025. We will try to obtain her Tempus testing from [place].   Hypothyroidism due to medication She had been on levothyroxine 75 mcg daily since May.  TSH was suppressed in July, so levothyroxine was decreased to 50 mcg daily.  Her TSH was elevated again in September, 2024, so I increased levothyroxine back to 75 mcg daily.  Her last TSH was up to 10.590 so we may need to increase her dose if this trend continues.   Hyperbilirubinemia Significant increase in her bilirubin.  There was no evidence of hemolysis or biliary obstruction and this returned to normal by the following week. Ultrasound of the right upper quadrant was negative. We asked her to discontinue acetaminophen and use meloxicam prescribed for joint pain. We also instructed her to avoid alcohol. This rarely could be a late effect of the immunotherapy. Repeat was completley normal within 1 week and is just mildly elevated today at 1.8 last visit and back to normal today. We will continue to monitor.This has been intermittent. 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 If it persists we can check her UGT1A1.   Plan: She is now back on immunotherapy to treat bilateral pulmonary metastases seen on CT scan from December, 2024. She also informed me that a couple of weeks ago she quit smoking again. I will place her on a Z-pack 250 mg BID for symptoms of acute bronchitis. I will refill hydrocodone 5-325 mg. She had an ultrasound of the abdomen due to an elevated bilirubin and this was normal. Her bilirubin has also returned back within normal range. She has a  elevated WBC of 11.6 with increased monocytes of 1.1 and increased eosinophils, hemoglobin of 13.5, and platelet count of 274,000. Her CMP is normal and last visit her TSH level increased from 3.59 to 10.590. I will add a TSH to her labs today. I informed her that if her TSH level continues to rise we will need to make adjustments to her levothyroxine. Her day 1 cycle 2 of Keytruda is scheduled on 02/25/2023.  I will see her back in 3 weeks with CBC, CMP, and TSH. I have answered her questions. The patient understands the plans discussed today and is in agreement with them.  She knows to contact our office if she develops concerns prior to her next appointment.  I provided 15 minutes of face-to-face time during this encounter and > 50% was spent counseling as documented under my assessment and plan.   Dellia Beckwith, MD [name] [address] [place] [address] Dept: [phone] Dept Fax: [phone]   No orders of the defined types were placed in this encounter.   CHIEF COMPLAINT:  CC: Stage IA2 adenocarcinoma of the lung   Current Treatment:  Observation   HISTORY OF PRESENT ILLNESS:  Sydney Hunt is a 63 y.o. female with a history of tobacco abuse who has adenocarcinoma of the left lung. This was originally found on lung cancer screening CT from September 2022. PET imaging was obtained in October which revealed a solitary 1.25 cm left lower lobe pulmonary nodule to be hypermetabolic and consistent with small primary lung neoplasm. No findings for mediastinal or hilar adenopathy or metastatic disease elsewhere was observed. She met with the genetic counselor and [facility] diagnostic testing was negative for any clinically significant mutations. CT chest from December revealed an enlarging left lower lobe nodule, measuring 1.4 x 1.9 cm. The additional smaller pulmonary nodules remained unchanged. She was referred to Dr. 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 Immunohistochemical stains were positive for TTF-1, and negative for p63 and CK5/6. 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 Her FEV1 was 1.98, which is 88% of predicted and her FVC was 2.58, which is 89% of predicted. She was  deemed a candidate for resection and met with Dr. Cliffton Asters for consultation. As she is not motivated to quit smoking, he felt that surgery would present an excessive risk for postoperative respiratory complications. He recommended SBRT as a good option if the patient was unable to quit smoking.  The patient went for a second opinion to Dr. Lucienne Minks and surgical resection was carried out in [place] on April 12 with a left lower lobe lobectomy.  She was hospitalized for 13 days and has not smoked since that time.  Pathology revealed a 2.5 cm adenocarcinoma, grade 2.  There was no evidence of pleural invasion and margins are clear but she tells me 2 of 20 nodes were positive, for a T1c N1 M0, stage IIB.  A port was placed on August 16 and she was started on pembrolizumab on September 7 and had her second dose on September 28. She refused chemotherapy. We recommended a full year of the pembrolizumab, which was completed in early September of 2024.  Oncology History  Non-small cell lung cancer, left (HCC)  01/15/2021 Initial Diagnosis   Non-small cell lung cancer, left (HCC)   02/12/2021 Cancer Staging  Staging form: Lung, AJCC 8th Edition - Clinical stage from 02/12/2021: Stage IA2 (cT1b, cN0, cM0) - Signed by Dellia Beckwith, MD on 02/14/2021 Histopathologic type: Adenocarcinoma, NOS Stage prefix: Initial diagnosis Histologic grade (G): GX Histologic grading system: 4 grade system Laterality: Left Tumor size (mm): 19 Lymph-vascular invasion (LVI): LVI not present (absent)/not identified Diagnostic confirmation: Positive histology Specimen type: Bronchial Biopsy Staged by: Managing physician Stage used in treatment planning: Yes National guidelines used in treatment planning: Yes Type of national guideline used in treatment planning: NCCN Staging comments: Rec surgical resection   05/11/2021 Cancer Staging   Staging form: Lung, AJCC 8th Edition - Pathologic stage from 05/11/2021:  Stage IIB (ypT1c, pN1, cM0) - Signed by Dellia Beckwith, MD on 11/12/2021 Histopathologic type: Adenocarcinoma, NOS Stage prefix: Post-therapy Histologic grade (G): G2 Histologic grading system: 4 grade system Residual tumor (R): R0 - None Laterality: Left Tumor size (mm): 25 Lymph-vascular invasion (LVI): LVI not present (absent)/not identified Diagnostic confirmation: Positive histology PLUS positive immunophenotyping and/or positive genetic studies Specimen type: Excision Staged by: Managing physician Type of lung cancer: Surgically resected non-small cell lung cancer ECOG performance status: Grade 1 Perineural invasion (PNI): Absent Pleural/elastic layer invasion: PL0 Pleural lavage cytology: Unknown Weight loss: Absent Adequacy of mediastinal dissection: Adequate Radiotherapy dose: No Adjuvant radiation: No Adjuvant chemotherapy: Yes Stage used in treatment planning: Yes National guidelines used in treatment planning: Yes Type of national guideline used in treatment planning: NCCN Staging comments: Adjuvant pembrolizumab, PDL 1 90%   10/04/2021 - 10/01/2022 Chemotherapy   Patient is on Treatment Plan : LUNG NSCLC Pembrolizumab Adj q21d x 18 cycles     02/04/2023 -  Chemotherapy   Patient is on Treatment Plan : LUNG NSCLC Pembrolizumab (200) q21d       INTERVAL HISTORY:  Sydney Hunt is here today for repeat clinical assessment for stage IA2 adenocarcinoma of the lung. She is now back on immunotherapy to treat bilateral pulmonary metastases seen on CT scan from December, 2024. Patient complains of bilateral rib pain rating 6/10, bilateral outer groin pain, constipation, and a cough that worsens at night and has persisted for over a week and a half. She occasionally produces clear mucous and I told her she can use Mucinex. She denies any fever and informed me that she has not been using her inhaler. She also informed me that a couple of weeks ago she quit smoking again. I will place her  on a Z-pack 250 mg BID for the first day and then take it once daily for a total of 5 days. I will refill hydrocodone 5-325 mg. She had an ultrasound of the abdomen due to an elevated bilirubin and this was normal. Her bilirubin has also returned back within normal range. She has a elevated WBC of 11.6 with increased monocytes of 1.1 and increased eosinophils, hemoglobin of 13.5, and platelet count of 274,000. Her CMP is normal and last visit her TSH level increased from 3.59 to 10.590. I will add a TSH to her labs today. I informed her that if her TSH level continues to rise we will need to make adjustments to her levothyroxine. Her day 1 cycle 2 of Keytruda is scheduled on 02/25/2023. I will see her back in 3 weeks with CBC, CMP, and TSH.  She denies signs of infection such as sore throat or urinary symptoms.  She denies fevers or recurrent chills. She denies nausea, vomiting, chest pain, dyspnea. Her appetite is ok and her weight has been stable.  REVIEW OF SYSTEMS:  Review of Systems  Constitutional:  Negative for appetite change (ok), chills, diaphoresis, fatigue, fever and unexpected weight change.  HENT:  Negative.  Negative for hearing loss, lump/mass, mouth sores, nosebleeds, sore throat, tinnitus, trouble swallowing and voice change.   Eyes: Negative.  Negative for eye problems and icterus.  Respiratory:  Positive for cough and shortness of breath. Negative for chest tightness, hemoptysis and wheezing.   Cardiovascular:  Positive for chest pain (rib pain). Negative for leg swelling and palpitations.  Gastrointestinal:  Positive for constipation. Negative for abdominal distention, abdominal pain, blood in stool, diarrhea, nausea, rectal pain and vomiting.  Endocrine: Negative.   Genitourinary: Negative.  Negative for bladder incontinence, difficulty urinating, dyspareunia, dysuria, frequency, hematuria, menstrual problem, nocturia, pelvic pain, vaginal bleeding and vaginal discharge.    Musculoskeletal:  Positive for arthralgias and myalgias. Negative for back pain, flank pain, gait problem, neck pain and neck stiffness.       Hip and bilateral groin pain  Skin: Negative.   Neurological: Negative.  Negative for dizziness, extremity weakness, gait problem, headaches, light-headedness, numbness, seizures and speech difficulty.  Hematological: Negative.  Negative for adenopathy. Does not bruise/bleed easily.  Psychiatric/Behavioral: Negative.  Negative for confusion, decreased concentration, depression, sleep disturbance and suicidal ideas. The patient is not nervous/anxious.      VITALS:  Blood pressure 120/80, pulse 79, temperature 98.2 F (36.8 C), temperature source Oral, resp. rate 17, height 4\' 11"  (1.499 m), weight 101 lb 3.2 oz (45.9 kg), SpO2 99%.  Wt Readings from Last 3 Encounters:  02/25/23 102 lb 0.6 oz (46.3 kg)  02/20/23 101 lb 3.2 oz (45.9 kg)  02/04/23 101 lb (45.8 kg)    Body mass index is 20.44 kg/m.  Performance status (ECOG): 1 - Symptomatic but completely ambulatory  PHYSICAL EXAM:  Physical Exam Vitals and nursing note reviewed.  Constitutional:      General: She is not in acute distress.    Appearance: Normal appearance. She is normal weight. She is not ill-appearing, toxic-appearing or diaphoretic.  HENT:     Head: Normocephalic and atraumatic.     Right Ear: Tympanic membrane, ear canal and external ear normal. There is no impacted cerumen.     Left Ear: Tympanic membrane, ear canal and external ear normal. There is no impacted cerumen.     Nose: Nose normal. No congestion or rhinorrhea.     Mouth/Throat:     Mouth: Mucous membranes are moist.     Pharynx: Oropharynx is clear. No oropharyngeal exudate or posterior oropharyngeal erythema.  Eyes:     General: No scleral icterus.       Right eye: No discharge.        Left eye: No discharge.     Extraocular Movements: Extraocular movements intact.     Conjunctiva/sclera: Conjunctivae  normal.     Pupils: Pupils are equal, round, and reactive to light.  Neck:     Vascular: No carotid bruit.  Cardiovascular:     Rate and Rhythm: Normal rate and regular rhythm.     Pulses: Normal pulses.     Heart sounds: Normal heart sounds. No murmur heard.    No friction rub. No gallop.  Pulmonary:     Effort: Pulmonary effort is normal. No respiratory distress.     Breath sounds: No stridor. Examination of the left-upper field reveals rhonchi. Rhonchi present. No wheezing or rales.     Comments: Expiratory rhonchi in the left upper lobe Chest:  Chest wall: No tenderness.  Abdominal:     General: Bowel sounds are normal. There is no distension.     Palpations: Abdomen is soft. There is no hepatomegaly, splenomegaly or mass.     Tenderness: There is no abdominal tenderness. There is no right CVA tenderness, left CVA tenderness, guarding or rebound.     Hernia: No hernia is present.  Musculoskeletal:        General: No swelling, tenderness, deformity or signs of injury. Normal range of motion.     Cervical back: Normal range of motion and neck supple. No rigidity or tenderness.     Right lower leg: No edema.     Left lower leg: No edema.  Lymphadenopathy:     Cervical: No cervical adenopathy.     Right cervical: No superficial, deep or posterior cervical adenopathy.    Left cervical: No superficial, deep or posterior cervical adenopathy.     Upper Body:     Right upper body: No supraclavicular, axillary or pectoral adenopathy.     Left upper body: No supraclavicular, axillary or pectoral adenopathy.  Skin:    General: Skin is warm and dry.     Coloration: Skin is not jaundiced or pale.     Findings: No bruising, erythema, lesion or rash.  Neurological:     General: No focal deficit present.     Mental Status: She is alert and oriented to person, place, and time. Mental status is at baseline.     Cranial Nerves: No cranial nerve deficit.     Sensory: No sensory deficit.      Motor: No weakness.     Coordination: Coordination normal.     Gait: Gait normal.     Deep Tendon Reflexes: Reflexes normal.  Psychiatric:        Mood and Affect: Mood normal.        Behavior: Behavior normal.        Thought Content: Thought content normal.        Judgment: Judgment normal.     LABS:      Latest Ref Rng & Units 02/20/2023    1:01 PM 02/04/2023    9:07 AM 01/13/2023   12:00 AM  CBC  WBC 4.0 - 10.5 K/uL 11.6  10.7  9.8      Hemoglobin 12.0 - 15.0 g/dL 40.9  81.1  91.4      Hematocrit 36.0 - 46.0 % 38.5  41.3  38      Platelets 150 - 400 K/uL 274  286  259         This result is from an external source.      Latest Ref Rng & Units 02/20/2023    1:01 PM 02/04/2023    9:07 AM 01/13/2023   12:00 AM  CMP  Glucose 70 - 99 mg/dL 782  96    BUN 8 - 23 mg/dL 17  15  15       Creatinine 0.44 - 1.00 mg/dL 9.56  2.13  0.8      Sodium 135 - 145 mmol/L 140  140  139      Potassium 3.5 - 5.1 mmol/L 4.2  4.5  4.1      Chloride 98 - 111 mmol/L 102  104  108      CO2 22 - 32 mmol/L 27  26  25       Calcium 8.9 - 10.3 mg/dL 08.6  57.8  9.5  Total Protein 6.5 - 8.1 g/dL 7.2  7.5    Total Bilirubin 0.0 - 1.2 mg/dL 1.1  1.8    Alkaline Phos 38 - 126 U/L 108  109  70      AST 15 - 41 U/L 17  15  18       ALT 0 - 44 U/L 11  11  10          This result is from an external source.   Lab Results  Component Value Date   CEA1 5.3 (H) 10/17/2022   CEA 4.2 01/13/2023   /  CEA  Date Value Ref Range Status  01/13/2023 4.2  Final  10/17/2022 5.3 (H) 0.0 - 4.7 ng/mL Final    Comment:    (NOTE)                             Nonsmokers          <3.9                             Smokers             <5.6 Roche [employer] Electrochemiluminescence Immunoassay (ECLIA) Values obtained with different assay methods or kits cannot be used interchangeably.  Results cannot be interpreted as absolute evidence of the presence or absence of malignant disease. Performed At: [facility] [address] [place], [place] [id number] Jolene Schimke MD WU:[id number]    Lab Results  Component Value Date   TSH 3.342 02/20/2023   T4TOTAL 7.4 02/04/2023   No results found for: "PSA1" No results found for: "NFA213" No results found for: "CAN125"  No results found for: "TOTALPROTELP", "ALBUMINELP", "A1GS", "A2GS", "BETS", "BETA2SER", "GAMS", "MSPIKE", "SPEI" No results found for: "TIBC", "FERRITIN", "IRONPCTSAT" Lab Results  Component Value Date   LDH 130 11/28/2022    STUDIES:  Exam: 01/13/2023 CT Chest with CM Impression: Interval progressive in size and number of multiple bilateral pulmonary nodules. These findings are most consistent with progression of metastatic disease.  The nodules have increased in number and size when compared to the previous report of September 24, 2022.  The nodules are up to 7 mm in diameter. She also has a pleural-based plaque of the left upper lobe posterior medial measuring 2.9 cm. There are postsurgical changes consistent with a left lower lobectomy. Left Port-A-Cath terminates in the distal superior vena cava.    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 Report date is 12/12/2020.     The Multi-Cancer + RNA Panel offered by [facility] includes sequencing and/or deletion/duplication analysis of the following 84 genes:  AIP*, ALK, APC*, ATM*, AXIN2*, BAP1*, BARD1*, BLM*, BMPR1A*, BRCA1*, BRCA2*, BRIP1*, CASR, CDC73*, CDH1*, CDK4, CDKN1B*,  CDKN1C*, CDKN2A, CEBPA, CHEK2*, CTNNA1*, DICER1*, DIS3L2*, EGFR, EPCAM, FH   GERD (gastroesophageal reflux disease)    Heartburn 12/27/2021   Hormone disorder 11/20/2020  Hyperbilirubinemia 09/26/2022   Hypertension    Hypokalemia 12/27/2021   Hypothyroidism due to medication 01/22/2022   Leukocytosis 01/17/2022   Migraines    Nodule of left lung 11/21/2020   Non-small cell lung cancer, left (HCC) 01/15/2021   Port-A-Cath in place 09/05/2022   Tobacco use 12/07/2020    Past Surgical History:  Procedure Laterality Date   ABDOMINAL HYSTERECTOMY  1989   unsure of vaginal or abdominal   BRONCHIAL BIOPSY  01/15/2021   Procedure: BRONCHIAL BIOPSIES;  Surgeon: Leslye Peer, MD;  Location: [facility] ENDOSCOPY;  Service: Pulmonary;;   BRONCHIAL BRUSHINGS  01/15/2021   Procedure: BRONCHIAL BRUSHINGS;  Surgeon: Leslye Peer, MD;  Location: [facility] ENDOSCOPY;  Service: Pulmonary;;   BRONCHIAL NEEDLE ASPIRATION BIOPSY  01/15/2021   Procedure: BRONCHIAL NEEDLE ASPIRATION BIOPSIES;  Surgeon: Leslye Peer, MD;  Location: [facility] ENDOSCOPY;  Service: Pulmonary;;   COLONOSCOPY  2022   FIDUCIAL MARKER PLACEMENT  01/15/2021   Procedure: FIDUCIAL MARKER PLACEMENT;  Surgeon: Leslye Peer, MD;  Location: [facility] ENDOSCOPY;  Service: Pulmonary;;   ovaries removed  1986   surgery to remove scar tissue     from colon/bladder in patient's late 30's   TUBAL LIGATION  1986   UPPER GI ENDOSCOPY     years ago in patient's late 38s   VIDEO BRONCHOSCOPY WITH RADIAL ENDOBRONCHIAL ULTRASOUND  01/15/2021   Procedure: VIDEO BRONCHOSCOPY WITH RADIAL ENDOBRONCHIAL ULTRASOUND;  Surgeon: Leslye Peer, MD;  Location: [facility] ENDOSCOPY;  Service: Pulmonary;;    Family History  Problem Relation Age of Onset   Pancreatic cancer Maternal Aunt 50   Brain cancer Maternal Uncle    Stomach cancer Maternal Uncle        dx. >50   Prostate cancer Half-Brother        passed away at 47   Prostate cancer Half-Brother     Social  History:  reports that she quit smoking about 22 months ago. Her smoking use included cigarettes. She started smoking about 62 years ago. She has a 30.1 pack-year smoking history. She has never used smokeless tobacco. She reports current alcohol use of about 4.0 standard drinks of alcohol per week. She reports current drug use. Frequency: 3.00 times per week. Drug: Marijuana.The patient is alone today.  Allergies:  Allergies  Allergen Reactions   Codeine Rash   Demerol [Meperidine Hcl] Other (See Comments)    coma   Meperidine Other (See Comments)    Several days will pass without patient being aware   Other Dermatitis    Stainless steel / skin weeps   Silver Dermatitis    Sterling silver skin weeps   Gold Bond [Menthol-Zinc Oxide] Dermatitis    Skin weeps   Neosporin [Bacitracin-Polymyxin B] Dermatitis    Skin weeps   Wound Dressing Adhesive Other (See Comments)    CAUSES SKIN TEARS PER PATIENT. INSTEAD USE THE TEGADERM 19M WITH ADHESIVE FREE WINDOW OVER PORT.    Current Medications: Current Outpatient Medications  Medication Sig Dispense Refill   aspirin 81 MG EC tablet Take 81 mg by mouth daily.     atorvastatin (LIPITOR) 40 MG tablet Take 40 mg by mouth daily.     azithromycin (ZITHROMAX Z-PAK) 250 MG tablet 2 pills today, then 1 pill daily 6 each 0   celecoxib (CELEBREX) 200 MG capsule Take 200 mg by mouth daily.     DAYVIGO 5 MG TABS Take 1 tablet by mouth at bedtime.     levothyroxine (SYNTHROID) 75  MCG tablet Take 1 tablet (75 mcg total) by mouth daily. 30 tablet 5   ondansetron (ZOFRAN) 4 MG tablet Take 1 tablet (4 mg total) by mouth every 4 (four) hours as needed for nausea. 90 tablet 3   potassium chloride SA (KLOR-CON M) 20 MEQ tablet Take 3 tablets (60 mEq total) by mouth 2 (two) times daily. Taking 2 tablets in the morning and 2 tablets in the afternoon (Patient taking differently: Take 60 mEq by mouth 2 (two) times daily. Patient voiced taking one tablet in morning and  two tablets in the evening.) 180 tablet 3   Prenatal Vit-Fe Fumarate-FA (PRENATAL VITAMIN) 27-0.8 MG TABS Take 1 tablet by mouth daily. 30 tablet 5   prochlorperazine (COMPAZINE) 10 MG tablet TAKE 1 TABLET(10 MG) BY MOUTH EVERY 6 HOURS AS NEEDED FOR NAUSEA OR VOMITING 360 tablet 0   HYDROcodone-acetaminophen (NORCO/VICODIN) 5-325 MG tablet Take 1 tablet by mouth every 6 (six) hours as needed for moderate pain (pain score 4-6) or severe pain (pain score 7-10). 30 tablet 0   lidocaine-prilocaine (EMLA) cream Apply 1 Application topically as needed. 30 g 0   No current facility-administered medications for this visit.     I,Jasmine M Lassiter,acting as a scribe for Dellia Beckwith, MD.,have documented all relevant documentation on the behalf of Dellia Beckwith, MD,as directed by  Dellia Beckwith, MD while in the presence of Dellia [id number]

## 2023-02-20 ENCOUNTER — Encounter: Payer: Self-pay | Admitting: Oncology

## 2023-02-20 ENCOUNTER — Other Ambulatory Visit: Payer: Self-pay | Admitting: Oncology

## 2023-02-20 ENCOUNTER — Inpatient Hospital Stay (HOSPITAL_BASED_OUTPATIENT_CLINIC_OR_DEPARTMENT_OTHER): Payer: 59 | Admitting: Oncology

## 2023-02-20 ENCOUNTER — Other Ambulatory Visit: Payer: Self-pay

## 2023-02-20 ENCOUNTER — Inpatient Hospital Stay: Payer: 59

## 2023-02-20 VITALS — BP 120/80 | HR 79 | Temp 98.2°F | Resp 17 | Ht 59.0 in | Wt 101.2 lb

## 2023-02-20 DIAGNOSIS — Z95828 Presence of other vascular implants and grafts: Secondary | ICD-10-CM

## 2023-02-20 DIAGNOSIS — J209 Acute bronchitis, unspecified: Secondary | ICD-10-CM | POA: Diagnosis not present

## 2023-02-20 DIAGNOSIS — C7802 Secondary malignant neoplasm of left lung: Secondary | ICD-10-CM

## 2023-02-20 DIAGNOSIS — E032 Hypothyroidism due to medicaments and other exogenous substances: Secondary | ICD-10-CM

## 2023-02-20 DIAGNOSIS — J219 Acute bronchiolitis, unspecified: Secondary | ICD-10-CM

## 2023-02-20 DIAGNOSIS — C3492 Malignant neoplasm of unspecified part of left bronchus or lung: Secondary | ICD-10-CM

## 2023-02-20 DIAGNOSIS — C7801 Secondary malignant neoplasm of right lung: Secondary | ICD-10-CM | POA: Diagnosis not present

## 2023-02-20 DIAGNOSIS — Z5112 Encounter for antineoplastic immunotherapy: Secondary | ICD-10-CM | POA: Diagnosis not present

## 2023-02-20 LAB — CBC WITH DIFFERENTIAL (CANCER CENTER ONLY)
Abs Immature Granulocytes: 0.02 10*3/uL (ref 0.00–0.07)
Basophils Absolute: 0.3 10*3/uL — ABNORMAL HIGH (ref 0.0–0.1)
Basophils Relative: 2 %
Eosinophils Absolute: 1.6 10*3/uL — ABNORMAL HIGH (ref 0.0–0.5)
Eosinophils Relative: 14 %
HCT: 38.5 % (ref 36.0–46.0)
Hemoglobin: 13.5 g/dL (ref 12.0–15.0)
Immature Granulocytes: 0 %
Lymphocytes Relative: 29 %
Lymphs Abs: 3.3 10*3/uL (ref 0.7–4.0)
MCH: 33.3 pg (ref 26.0–34.0)
MCHC: 35.1 g/dL (ref 30.0–36.0)
MCV: 95.1 fL (ref 80.0–100.0)
Monocytes Absolute: 1.1 10*3/uL — ABNORMAL HIGH (ref 0.1–1.0)
Monocytes Relative: 9 %
Neutro Abs: 5.4 10*3/uL (ref 1.7–7.7)
Neutrophils Relative %: 46 %
Platelet Count: 274 10*3/uL (ref 150–400)
RBC: 4.05 MIL/uL (ref 3.87–5.11)
RDW: 12.3 % (ref 11.5–15.5)
WBC Count: 11.6 10*3/uL — ABNORMAL HIGH (ref 4.0–10.5)
nRBC: 0 % (ref 0.0–0.2)
nRBC: 0 /100{WBCs}

## 2023-02-20 LAB — CMP (CANCER CENTER ONLY)
ALT: 11 U/L (ref 0–44)
AST: 17 U/L (ref 15–41)
Albumin: 4.3 g/dL (ref 3.5–5.0)
Alkaline Phosphatase: 108 U/L (ref 38–126)
Anion gap: 10 (ref 5–15)
BUN: 17 mg/dL (ref 8–23)
CO2: 27 mmol/L (ref 22–32)
Calcium: 10.1 mg/dL (ref 8.9–10.3)
Chloride: 102 mmol/L (ref 98–111)
Creatinine: 0.79 mg/dL (ref 0.44–1.00)
GFR, Estimated: >60 mL/min (ref >60)
Glucose, Bld: 105 mg/dL — ABNORMAL HIGH (ref 70–99)
Potassium: 4.2 mmol/L (ref 3.5–5.1)
Sodium: 140 mmol/L (ref 135–145)
Total Bilirubin: 1.1 mg/dL (ref 0.0–1.2)
Total Protein: 7.2 g/dL (ref 6.5–8.1)

## 2023-02-20 LAB — TSH: TSH: 3.342 u[IU]/mL (ref 0.350–4.500)

## 2023-02-20 MED ORDER — HYDROCODONE-ACETAMINOPHEN 5-325 MG PO TABS: 1.0000 | ORAL_TABLET | Freq: Four times a day (QID) | ORAL | 0 refills | Status: DC | PRN

## 2023-02-20 MED ORDER — AZITHROMYCIN 250 MG PO TABS: ORAL_TABLET | ORAL | 0 refills | Status: DC

## 2023-02-20 MED ORDER — HEPARIN SOD (PORK) LOCK FLUSH 100 UNIT/ML IV SOLN
500.0000 [IU] | Freq: Once | INTRAVENOUS | Status: AC | PRN
  Administered 2023-02-20: 500 [IU]

## 2023-02-20 MED ORDER — SODIUM CHLORIDE 0.9% FLUSH
10.0000 mL | INTRAVENOUS | Status: DC | PRN
  Administered 2023-02-20: 10 mL

## 2023-02-25 ENCOUNTER — Ambulatory Visit: Payer: Medicare HMO | Admitting: Oncology

## 2023-02-25 ENCOUNTER — Inpatient Hospital Stay: Payer: 59

## 2023-02-25 ENCOUNTER — Other Ambulatory Visit: Payer: Medicare HMO

## 2023-02-25 VITALS — BP 123/80 | HR 73 | Temp 97.7°F | Resp 18 | Ht 59.0 in | Wt 102.0 lb

## 2023-02-25 DIAGNOSIS — C3492 Malignant neoplasm of unspecified part of left bronchus or lung: Secondary | ICD-10-CM

## 2023-02-25 DIAGNOSIS — Z5112 Encounter for antineoplastic immunotherapy: Secondary | ICD-10-CM | POA: Diagnosis not present

## 2023-02-25 MED ORDER — SODIUM CHLORIDE 0.9 % IV SOLN: INTRAVENOUS | Status: DC

## 2023-02-25 MED ORDER — HEPARIN SOD (PORK) LOCK FLUSH 100 UNIT/ML IV SOLN
500.0000 [IU] | Freq: Once | INTRAVENOUS | Status: AC | PRN
  Administered 2023-02-25: 500 [IU]

## 2023-02-25 MED ORDER — SODIUM CHLORIDE 0.9 % IV SOLN
200.0000 mg | Freq: Once | INTRAVENOUS | Status: AC
  Administered 2023-02-25: 200 mg via INTRAVENOUS
  Filled 2023-02-25: qty 8

## 2023-02-25 MED ORDER — SODIUM CHLORIDE 0.9% FLUSH
10.0000 mL | INTRAVENOUS | Status: DC | PRN
Start: 2023-02-25 — End: 2023-02-25
  Administered 2023-02-25: 10 mL

## 2023-02-25 NOTE — Patient Instructions (Signed)

## 2023-02-27 ENCOUNTER — Encounter: Payer: Self-pay | Admitting: Oncology

## 2023-03-11 ENCOUNTER — Other Ambulatory Visit: Payer: Self-pay

## 2023-03-11 NOTE — Progress Notes (Incomplete)
[facility]  [address] 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  She was treated with left lower lobectomy in [place].  Her doctor there recommended chemotherapy with immunotherapy, but she refused chemotherapy.  PD-L1 was positive.  She was only willing to take the immunotherapy for 1 year. She started pembrolizumab in August 2023.  We saw her in October 2023, when she returned to [place] and continued pembrolizumab every 21 days for 1 year, completed in early September of 2024. Routine CT imaging in August was negative for recurrence. There were stable scattered small bilateral pulmonary nodules left, felt to be from benign emphysema Surgical changes were seen in the left lower lobe. She had a CT chest done on 01/13/2023 that revealed interval progressive in size and number of multiple bilateral pulmonary nodules most consistent with progression of metastatic disease. We have discussed options of treatment and she wishes not to pursue chemotherapy unless absolutely necessary and we decided not to pursue a biopsy in view of the difficulty involved. I will request her Tempus testing from [place] that was done at the time of her left lower lobectomy.  I have therefore started her back on Keytruda on January 7th, 2025.  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  These measure up to 7 mm in diameter and she does have an area of pleural plaque in the left upper lobe, posterior medial, measuring 2.9 cm.  We know that her prior PD-L1 was positive at 90% and so we had started her back on Keytruda as of January 2025. We  will try to obtain her Tempus testing from [place].   Hypothyroidism due to medication She had been on levothyroxine 75 mcg daily since May.  TSH was suppressed in July, so levothyroxine was decreased to 50 mcg daily.  Her TSH was elevated again in September, 2024, so I increased levothyroxine back to 75 mcg daily.  Her last TSH was up to 10.590 so we may need to increase her dose if this trend continues.   Hyperbilirubinemia Significant increase in her bilirubin.  There was no evidence of hemolysis or biliary obstruction and this returned to normal by the following week. Ultrasound of the right upper quadrant was negative. We asked her to discontinue acetaminophen and use meloxicam prescribed for joint pain. We also instructed her to avoid alcohol. This rarely could be a late effect of the immunotherapy. Repeat was completley normal within 1 week and is just mildly elevated today at 1.8 last visit and back to normal today. We will continue to monitor.This has been intermittent. 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 If it persists we can check her UGT1A1.   Plan: She is now back on immunotherapy to treat bilateral pulmonary metastases seen on CT scan from December, 2024. She also informed me that a couple of weeks ago she quit smoking again. I will place her on a Z-pack 250 mg BID for symptoms of acute bronchitis. I will refill hydrocodone 5-325 mg. She had an ultrasound of the abdomen due to an elevated bilirubin and this was normal. Her bilirubin has also returned back within normal range. She has a elevated  WBC of 11.6 with increased monocytes of 1.1 and increased eosinophils, hemoglobin of 13.5, and platelet count of 274,000. Her CMP is normal and last visit her TSH level increased from 3.59 to 10.590. I will add a TSH to her labs today. I informed her that if her TSH level continues to rise we will need to make adjustments to her levothyroxine. Her day 1 cycle 2 of Keytruda is scheduled on 02/25/2023. I will  see her back in 3 weeks with CBC, CMP, and TSH. I have answered her questions. The patient understands the plans discussed today and is in agreement with them.  She knows to contact our office if she develops concerns prior to her next appointment.  I provided 15 minutes of face-to-face time during this encounter and > 50% was spent counseling as documented under my assessment and plan.   Dellia Beckwith, MD [facility] - [facility] OF [facility] [address] [place] [address] Dept: [phone] Dept Fax: [phone]   No orders of the defined types were placed in this encounter.   CHIEF COMPLAINT:  CC: Stage IA2 adenocarcinoma of the lung   Current Treatment:  Observation   HISTORY OF PRESENT ILLNESS:  Sydney Hunt is a 63 y.o. female with a history of tobacco abuse who has adenocarcinoma of the left lung. This was originally found on lung cancer screening CT from September 2022. PET imaging was obtained in October which revealed a solitary 1.25 cm left lower lobe pulmonary nodule to be hypermetabolic and consistent with small primary lung neoplasm. No findings for mediastinal or hilar adenopathy or metastatic disease elsewhere was observed. She met with the genetic counselor and [facility] diagnostic testing was negative for any clinically significant mutations. CT chest from December revealed an enlarging left lower lobe nodule, measuring 1.4 x 1.9 cm. The additional smaller pulmonary nodules remained unchanged. She was referred to Dr. 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 Immunohistochemical stains were positive for TTF-1, and negative for p63 and CK5/6. 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 Her FEV1 was 1.98, which is 88% of predicted and her FVC was 2.58, which is 89% of predicted. She was deemed a  candidate for resection and met with Dr. Cliffton Asters for consultation. As she is not motivated to quit smoking, he felt that surgery would present an excessive risk for postoperative respiratory complications. He recommended SBRT as a good option if the patient was unable to quit smoking.  The patient went for a second opinion to Dr. Lucienne Minks and surgical resection was carried out in [place] on April 12 with a left lower lobe lobectomy.  She was hospitalized for 13 days and has not smoked since that time.  Pathology revealed a 2.5 cm adenocarcinoma, grade 2.  There was no evidence of pleural invasion and margins are clear but she tells me 2 of 20 nodes were positive, for a T1c N1 M0, stage IIB.  A port was placed on August 16 and she was started on pembrolizumab on September 7 and had her second dose on September 28. She refused chemotherapy. We recommended a full year of the pembrolizumab, which was completed in early September of 2024.  Oncology History  Non-small cell lung cancer, left (HCC)  01/15/2021 Initial Diagnosis   Non-small cell lung cancer, left (HCC)   02/12/2021 Cancer Staging   Staging  form: Lung, AJCC 8th Edition - Clinical stage from 02/12/2021: Stage IA2 (cT1b, cN0, cM0) - Signed by Dellia Beckwith, MD on 02/14/2021 Histopathologic type: Adenocarcinoma, NOS Stage prefix: Initial diagnosis Histologic grade (G): GX Histologic grading system: 4 grade system Laterality: Left Tumor size (mm): 19 Lymph-vascular invasion (LVI): LVI not present (absent)/not identified Diagnostic confirmation: Positive histology Specimen type: Bronchial Biopsy Staged by: Managing physician Stage used in treatment planning: Yes National guidelines used in treatment planning: Yes Type of national guideline used in treatment planning: NCCN Staging comments: Rec surgical resection   05/11/2021 Cancer Staging   Staging form: Lung, AJCC 8th Edition - Pathologic stage from 05/11/2021: Stage IIB  (ypT1c, pN1, cM0) - Signed by Dellia Beckwith, MD on 11/12/2021 Histopathologic type: Adenocarcinoma, NOS Stage prefix: Post-therapy Histologic grade (G): G2 Histologic grading system: 4 grade system Residual tumor (R): R0 - None Laterality: Left Tumor size (mm): 25 Lymph-vascular invasion (LVI): LVI not present (absent)/not identified Diagnostic confirmation: Positive histology PLUS positive immunophenotyping and/or positive genetic studies Specimen type: Excision Staged by: Managing physician Type of lung cancer: Surgically resected non-small cell lung cancer ECOG performance status: Grade 1 Perineural invasion (PNI): Absent Pleural/elastic layer invasion: PL0 Pleural lavage cytology: Unknown Weight loss: Absent Adequacy of mediastinal dissection: Adequate Radiotherapy dose: No Adjuvant radiation: No Adjuvant chemotherapy: Yes Stage used in treatment planning: Yes National guidelines used in treatment planning: Yes Type of national guideline used in treatment planning: NCCN Staging comments: Adjuvant pembrolizumab, PDL 1 90%   10/04/2021 - 10/01/2022 Chemotherapy   Patient is on Treatment Plan : LUNG NSCLC Pembrolizumab Adj q21d x 18 cycles     02/04/2023 -  Chemotherapy   Patient is on Treatment Plan : LUNG NSCLC Pembrolizumab (200) q21d       INTERVAL HISTORY:  Keonda is here today for repeat clinical assessment for stage IA2 adenocarcinoma of the lung. She is now back on immunotherapy to treat bilateral pulmonary metastases seen on CT scan from December, 2024. Patient states that she feels *** and ***.      She denies signs of infection such as sore throat, sinus drainage, cough, or urinary symptoms.  She denies fevers or recurrent chills. She denies pain. She denies nausea, vomiting, chest pain, dyspnea or cough. Her appetite is *** and her weight {Weight change:10426}.  Patient complains of bilateral rib pain rating 6/10, bilateral outer groin pain, constipation, and a  cough that worsens at night and has persisted for over a week and a half. She occasionally produces clear mucous and I told her she can use Mucinex. She denies any fever and informed me that she has not been using her inhaler. She also informed me that a couple of weeks ago she quit smoking again. I will place her on a Z-pack 250 mg BID for the first day and then take it once daily for a total of 5 days. I will refill hydrocodone 5-325 mg. She had an ultrasound of the abdomen due to an elevated bilirubin and this was normal. Her bilirubin has also returned back within normal range. She has a elevated WBC of 11.6 with increased monocytes of 1.1 and increased eosinophils, hemoglobin of 13.5, and platelet count of 274,000. Her CMP is normal and last visit her TSH level increased from 3.59 to 10.590. I will add a TSH to her labs today. I informed her that if her TSH level continues to rise we will need to make adjustments to her levothyroxine. Her day 1 cycle 2  of Rande Lawman is scheduled on 02/25/2023. I will see her back in 3 weeks with CBC, CMP, and TSH.  She denies signs of infection such as sore throat or urinary symptoms.  She denies fevers or recurrent chills. She denies nausea, vomiting, chest pain, dyspnea. Her appetite is ok and her weight has been stable.  REVIEW OF SYSTEMS:  Review of Systems  Constitutional:  Negative for appetite change, chills, diaphoresis, fatigue, fever and unexpected weight change.  HENT:  Negative.  Negative for hearing loss, lump/mass, mouth sores, nosebleeds, sore throat, tinnitus, trouble swallowing and voice change.   Eyes: Negative.  Negative for eye problems and icterus.  Respiratory:  Positive for cough and shortness of breath. Negative for chest tightness, hemoptysis and wheezing.   Cardiovascular:  Positive for chest pain (rib pain). Negative for leg swelling and palpitations.  Gastrointestinal:  Positive for constipation. Negative for abdominal distention, abdominal  pain, blood in stool, diarrhea, nausea, rectal pain and vomiting.  Endocrine: Negative.   Genitourinary: Negative.  Negative for bladder incontinence, difficulty urinating, dyspareunia, dysuria, frequency, hematuria, menstrual problem, nocturia, pelvic pain, vaginal bleeding and vaginal discharge.   Musculoskeletal:  Positive for arthralgias and myalgias. Negative for back pain, flank pain, gait problem, neck pain and neck stiffness.       Hip and bilateral groin pain  Skin: Negative.   Neurological: Negative.  Negative for dizziness, extremity weakness, gait problem, headaches, light-headedness, numbness, seizures and speech difficulty.  Hematological: Negative.  Negative for adenopathy. Does not bruise/bleed easily.  Psychiatric/Behavioral: Negative.  Negative for confusion, decreased concentration, depression, sleep disturbance and suicidal ideas. The patient is not nervous/anxious.      VITALS:  There were no vitals taken for this visit.  Wt Readings from Last 3 Encounters:  02/25/23 102 lb 0.6 oz (46.3 kg)  02/20/23 101 lb 3.2 oz (45.9 kg)  02/04/23 101 lb (45.8 kg)    There is no height or weight on file to calculate BMI.  Performance status (ECOG): 1 - Symptomatic but completely ambulatory  PHYSICAL EXAM:  Physical Exam Vitals and nursing note reviewed.  Constitutional:      General: She is not in acute distress.    Appearance: Normal appearance. She is normal weight. She is not ill-appearing, toxic-appearing or diaphoretic.  HENT:     Head: Normocephalic and atraumatic.     Right Ear: Tympanic membrane, ear canal and external ear normal. There is no impacted cerumen.     Left Ear: Tympanic membrane, ear canal and external ear normal. There is no impacted cerumen.     Nose: Nose normal. No congestion or rhinorrhea.     Mouth/Throat:     Mouth: Mucous membranes are moist.     Pharynx: Oropharynx is clear. No oropharyngeal exudate or posterior oropharyngeal erythema.  Eyes:      General: No scleral icterus.       Right eye: No discharge.        Left eye: No discharge.     Extraocular Movements: Extraocular movements intact.     Conjunctiva/sclera: Conjunctivae normal.     Pupils: Pupils are equal, round, and reactive to light.  Neck:     Vascular: No carotid bruit.  Cardiovascular:     Rate and Rhythm: Normal rate and regular rhythm.     Pulses: Normal pulses.     Heart sounds: Normal heart sounds. No murmur heard.    No friction rub. No gallop.  Pulmonary:     Effort: Pulmonary effort is  normal. No respiratory distress.     Breath sounds: No stridor. Examination of the left-upper field reveals rhonchi. Rhonchi present. No wheezing or rales.     Comments: Expiratory rhonchi in the left upper lobe Chest:     Chest wall: No tenderness.  Abdominal:     General: Bowel sounds are normal. There is no distension.     Palpations: Abdomen is soft. There is no hepatomegaly, splenomegaly or mass.     Tenderness: There is no abdominal tenderness. There is no right CVA tenderness, left CVA tenderness, guarding or rebound.     Hernia: No hernia is present.  Musculoskeletal:        General: No swelling, tenderness, deformity or signs of injury. Normal range of motion.     Cervical back: Normal range of motion and neck supple. No rigidity or tenderness.     Right lower leg: No edema.     Left lower leg: No edema.  Lymphadenopathy:     Cervical: No cervical adenopathy.     Right cervical: No superficial, deep or posterior cervical adenopathy.    Left cervical: No superficial, deep or posterior cervical adenopathy.     Upper Body:     Right upper body: No supraclavicular, axillary or pectoral adenopathy.     Left upper body: No supraclavicular, axillary or pectoral adenopathy.  Skin:    General: Skin is warm and dry.     Coloration: Skin is not jaundiced or pale.     Findings: No bruising, erythema, lesion or rash.  Neurological:     General: No focal deficit  present.     Mental Status: She is alert and oriented to person, place, and time. Mental status is at baseline.     Cranial Nerves: No cranial nerve deficit.     Sensory: No sensory deficit.     Motor: No weakness.     Coordination: Coordination normal.     Gait: Gait normal.     Deep Tendon Reflexes: Reflexes normal.  Psychiatric:        Mood and Affect: Mood normal.        Behavior: Behavior normal.        Thought Content: Thought content normal.        Judgment: Judgment normal.     LABS:      Latest Ref Rng & Units 02/20/2023    1:01 PM 02/04/2023    9:07 AM 01/13/2023   12:00 AM  CBC  WBC 4.0 - 10.5 K/uL 11.6  10.7  9.8      Hemoglobin 12.0 - 15.0 g/dL 40.9  81.1  91.4      Hematocrit 36.0 - 46.0 % 38.5  41.3  38      Platelets 150 - 400 K/uL 274  286  259         This result is from an external source.      Latest Ref Rng & Units 02/20/2023    1:01 PM 02/04/2023    9:07 AM 01/13/2023   12:00 AM  CMP  Glucose 70 - 99 mg/dL 782  96    BUN 8 - 23 mg/dL 17  15  15       Creatinine 0.44 - 1.00 mg/dL 9.56  2.13  0.8      Sodium 135 - 145 mmol/L 140  140  139      Potassium 3.5 - 5.1 mmol/L 4.2  4.5  4.1      Chloride 98 -  111 mmol/L 102  104  108      CO2 22 - 32 mmol/L 27  26  25       Calcium 8.9 - 10.3 mg/dL 16.1  09.6  9.5      Total Protein 6.5 - 8.1 g/dL 7.2  7.5    Total Bilirubin 0.0 - 1.2 mg/dL 1.1  1.8    Alkaline Phos 38 - 126 U/L 108  109  70      AST 15 - 41 U/L 17  15  18       ALT 0 - 44 U/L 11  11  10          This result is from an external source.   Lab Results  Component Value Date   CEA1 5.3 (H) 10/17/2022   CEA 4.2 01/13/2023   /  CEA  Date Value Ref Range Status  01/13/2023 4.2  Final  10/17/2022 5.3 (H) 0.0 - 4.7 ng/mL Final    Comment:    (NOTE)                             Nonsmokers          <3.9                             Smokers             <5.6 Roche [employer] Electrochemiluminescence Immunoassay (ECLIA) Values obtained with  different assay methods or kits cannot be used interchangeably.  Results cannot be interpreted as absolute evidence of the presence or absence of malignant disease. Performed At: [facility] [address] [place], [place] [id number] Jolene Schimke MD BJ:[id number]    Lab Results  Component Value Date   TSH 3.342 02/20/2023   T4TOTAL 7.4 02/04/2023   No results found for: "PSA1" No results found for: "YQM578" No results found for: "CAN125"  No results found for: "TOTALPROTELP", "ALBUMINELP", "A1GS", "A2GS", "BETS", "BETA2SER", "GAMS", "MSPIKE", "SPEI" No results found for: "TIBC", "FERRITIN", "IRONPCTSAT" Lab Results  Component Value Date   LDH 130 11/28/2022    STUDIES:  Exam: 01/13/2023 CT Chest with CM Impression: Interval progressive in size and number of multiple bilateral pulmonary nodules. These findings are most consistent with progression of metastatic disease.  The nodules have increased in number and size when compared to the previous report of September 24, 2022.  The nodules are up to 7 mm in diameter. She also has a pleural-based plaque of the left upper lobe posterior medial measuring 2.9 cm. There are postsurgical changes consistent with a left lower lobectomy. Left Port-A-Cath terminates in the distal superior vena cava.    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 Report date is 12/12/2020.     The Multi-Cancer + RNA Panel offered by [facility] includes  sequencing and/or deletion/duplication analysis of the following 84 genes:  AIP*, ALK,  APC*, ATM*, AXIN2*, BAP1*, BARD1*, BLM*, BMPR1A*, BRCA1*, BRCA2*, BRIP1*, CASR, CDC73*, CDH1*, CDK4, CDKN1B*, CDKN1C*, CDKN2A, CEBPA, CHEK2*, CTNNA1*, DICER1*, DIS3L2*, EGFR, EPCAM, FH   GERD (gastroesophageal reflux disease)    Heartburn 12/27/2021   Hormone disorder 11/20/2020   Hyperbilirubinemia 09/26/2022   Hypertension    Hypokalemia 12/27/2021   Hypothyroidism due to medication 01/22/2022   Leukocytosis 01/17/2022   Migraines    Nodule of left lung 11/21/2020   Non-small cell lung cancer, left (HCC) 01/15/2021   Port-A-Cath in place 09/05/2022   Tobacco use 12/07/2020    Past Surgical History:  Procedure Laterality Date   ABDOMINAL HYSTERECTOMY  1989   unsure of vaginal or abdominal   BRONCHIAL BIOPSY  01/15/2021   Procedure: BRONCHIAL BIOPSIES;  Surgeon: Leslye Peer, MD;  Location: [facility] ENDOSCOPY;  Service: Pulmonary;;   BRONCHIAL BRUSHINGS  01/15/2021   Procedure: BRONCHIAL BRUSHINGS;  Surgeon: Leslye Peer, MD;  Location[facility] ENDOSCOPY;  Service: Pulmonary;;   BRONCHIAL NEEDLE ASPIRATION BIOPSY  01/15/2021   Procedure: BRONCHIAL NEEDLE ASPIRATION BIOPSIES;  Surgeon: Leslye Peer, MD;  Location: [facility] ENDOSCOPY;  Service: Pulmonary;;   COLONOSCOPY  2022   FIDUCIAL MARKER PLACEMENT  01/15/2021   Procedure: FIDUCIAL MARKER PLACEMENT;  Surgeon: Leslye Peer, MD;  Location: [facility] ENDOSCOPY;  Service: Pulmonary;;   ovaries removed  1986   surgery to remove scar tissue     from colon/bladder in patient's late 30's   TUBAL LIGATION  1986   UPPER GI ENDOSCOPY     years ago in patient's late 19s   VIDEO BRONCHOSCOPY WITH RADIAL ENDOBRONCHIAL ULTRASOUND  01/15/2021   Procedure: VIDEO BRONCHOSCOPY WITH RADIAL ENDOBRONCHIAL ULTRASOUND;  Surgeon: Leslye Peer, MD;  Location: [facility] ENDOSCOPY;  Service: Pulmonary;;    Family History  Problem Relation Age of Onset   Pancreatic cancer Maternal  Aunt 50   Brain cancer Maternal Uncle    Stomach cancer Maternal Uncle        dx. >50   Prostate cancer Half-Brother        passed away at 77   Prostate cancer Half-Brother     Social History:  reports that she quit smoking about 22 months ago. Her smoking use included cigarettes. She started smoking about 62 years ago. She has a 30.1 pack-year smoking history. She has never used smokeless tobacco. She reports current alcohol use of about 4.0 standard drinks of alcohol per week. She reports current drug use. Frequency: 3.00 times per week. Drug: Marijuana.The patient is alone today.  Allergies:  Allergies  Allergen Reactions   Codeine Rash   Demerol [Meperidine Hcl] Other (See Comments)    coma   Meperidine Other (See Comments)    Several days will pass without patient being aware   Other Dermatitis    Stainless steel / skin weeps   Silver Dermatitis    Sterling silver skin weeps   Gold Bond [Menthol-Zinc Oxide] Dermatitis    Skin weeps   Neosporin [Bacitracin-Polymyxin B] Dermatitis    Skin weeps   Wound Dressing Adhesive Other (See Comments)    CAUSES SKIN TEARS PER PATIENT. INSTEAD USE THE TEGADERM 82M WITH ADHESIVE FREE WINDOW OVER PORT.    Current Medications: Current Outpatient Medications  Medication Sig Dispense Refill   aspirin 81 MG EC tablet Take 81 mg by mouth daily.     atorvastatin (LIPITOR) 40 MG tablet Take 40 mg by mouth daily.     azithromycin (ZITHROMAX Z-PAK) 250 MG tablet 2 pills  today, then 1 pill daily 6 each 0   celecoxib (CELEBREX) 200 MG capsule Take 200 mg by mouth daily.     DAYVIGO 5 MG TABS Take 1 tablet by mouth at bedtime.     HYDROcodone-acetaminophen (NORCO/VICODIN) 5-325 MG tablet Take 1 tablet by mouth every 6 (six) hours as needed for moderate pain (pain score 4-6) or severe pain (pain score 7-10). 30 tablet 0   levothyroxine (SYNTHROID) 75 MCG tablet Take 1 tablet (75 mcg total) by mouth daily. 30 tablet 5   lidocaine-prilocaine (EMLA)  cream Apply 1 Application topically as needed. 30 g 0   ondansetron (ZOFRAN) 4 MG tablet Take 1 tablet (4 mg total) by mouth every 4 (four) hours as needed for nausea. 90 tablet 3   potassium chloride SA (KLOR-CON M) 20 MEQ tablet Take 3 tablets (60 mEq total) by mouth 2 (two) times daily. Taking 2 tablets in the morning and 2 tablets in the afternoon (Patient taking differently: Take 60 mEq by mouth 2 (two) times daily. Patient voiced taking one tablet in morning and two tablets in the evening.) 180 tablet 3   Prenatal Vit-Fe Fumarate-FA (PRENATAL VITAMIN) 27-0.8 MG TABS Take 1 tablet by mouth daily. 30 tablet 5   prochlorperazine (COMPAZINE) 10 MG tablet TAKE 1 TABLET(10 MG) BY MOUTH EVERY 6 HOURS AS NEEDED FOR NAUSEA OR VOMITING 360 tablet 0   No current facility-administered medications for this visit.     I,Jasmine M Lassiter,acting as a scribe for Dellia Beckwith, MD.,have documented all relevant documentation on the behalf of Dellia Beckwith, MD,as directed by  Dellia Beckwith, MD while in the presence of Dellia Beckwith, MD.

## 2023-03-13 ENCOUNTER — Telehealth: Payer: Self-pay

## 2023-03-13 ENCOUNTER — Inpatient Hospital Stay: Payer: 59 | Attending: Hematology and Oncology

## 2023-03-13 ENCOUNTER — Inpatient Hospital Stay (HOSPITAL_BASED_OUTPATIENT_CLINIC_OR_DEPARTMENT_OTHER): Payer: 59 | Admitting: Hematology and Oncology

## 2023-03-13 ENCOUNTER — Encounter: Payer: Self-pay | Admitting: Hematology and Oncology

## 2023-03-13 ENCOUNTER — Telehealth: Payer: Self-pay | Admitting: Oncology

## 2023-03-13 VITALS — BP 118/71 | HR 78 | Temp 98.6°F | Resp 18 | Ht 59.0 in | Wt 100.6 lb

## 2023-03-13 DIAGNOSIS — Z79890 Hormone replacement therapy (postmenopausal): Secondary | ICD-10-CM | POA: Diagnosis not present

## 2023-03-13 DIAGNOSIS — Z90722 Acquired absence of ovaries, bilateral: Secondary | ICD-10-CM | POA: Insufficient documentation

## 2023-03-13 DIAGNOSIS — Z90710 Acquired absence of both cervix and uterus: Secondary | ICD-10-CM | POA: Diagnosis not present

## 2023-03-13 DIAGNOSIS — E032 Hypothyroidism due to medicaments and other exogenous substances: Secondary | ICD-10-CM

## 2023-03-13 DIAGNOSIS — F1290 Cannabis use, unspecified, uncomplicated: Secondary | ICD-10-CM | POA: Diagnosis not present

## 2023-03-13 DIAGNOSIS — Z808 Family history of malignant neoplasm of other organs or systems: Secondary | ICD-10-CM | POA: Insufficient documentation

## 2023-03-13 DIAGNOSIS — J439 Emphysema, unspecified: Secondary | ICD-10-CM | POA: Diagnosis not present

## 2023-03-13 DIAGNOSIS — C3492 Malignant neoplasm of unspecified part of left bronchus or lung: Secondary | ICD-10-CM | POA: Diagnosis not present

## 2023-03-13 DIAGNOSIS — C7801 Secondary malignant neoplasm of right lung: Secondary | ICD-10-CM | POA: Diagnosis not present

## 2023-03-13 DIAGNOSIS — Z885 Allergy status to narcotic agent status: Secondary | ICD-10-CM | POA: Insufficient documentation

## 2023-03-13 DIAGNOSIS — C7802 Secondary malignant neoplasm of left lung: Secondary | ICD-10-CM

## 2023-03-13 DIAGNOSIS — E876 Hypokalemia: Secondary | ICD-10-CM | POA: Insufficient documentation

## 2023-03-13 DIAGNOSIS — Z800 Family history of malignant neoplasm of digestive organs: Secondary | ICD-10-CM | POA: Insufficient documentation

## 2023-03-13 DIAGNOSIS — Z87891 Personal history of nicotine dependence: Secondary | ICD-10-CM | POA: Diagnosis not present

## 2023-03-13 DIAGNOSIS — I10 Essential (primary) hypertension: Secondary | ICD-10-CM | POA: Insufficient documentation

## 2023-03-13 DIAGNOSIS — Z79899 Other long term (current) drug therapy: Secondary | ICD-10-CM | POA: Insufficient documentation

## 2023-03-13 DIAGNOSIS — Z8042 Family history of malignant neoplasm of prostate: Secondary | ICD-10-CM | POA: Diagnosis not present

## 2023-03-13 DIAGNOSIS — Z5112 Encounter for antineoplastic immunotherapy: Secondary | ICD-10-CM | POA: Diagnosis present

## 2023-03-13 DIAGNOSIS — C3432 Malignant neoplasm of lower lobe, left bronchus or lung: Secondary | ICD-10-CM | POA: Diagnosis present

## 2023-03-13 LAB — CBC WITH DIFFERENTIAL (CANCER CENTER ONLY)
Abs Immature Granulocytes: 0.01 10*3/uL (ref 0.00–0.07)
Basophils Absolute: 0.2 10*3/uL — ABNORMAL HIGH (ref 0.0–0.1)
Basophils Relative: 3 %
Eosinophils Absolute: 2.1 10*3/uL — ABNORMAL HIGH (ref 0.0–0.5)
Eosinophils Relative: 21 %
HCT: 40.3 % (ref 36.0–46.0)
Hemoglobin: 14 g/dL (ref 12.0–15.0)
Immature Granulocytes: 0 %
Lymphocytes Relative: 28 %
Lymphs Abs: 2.7 10*3/uL (ref 0.7–4.0)
MCH: 33 pg (ref 26.0–34.0)
MCHC: 34.7 g/dL (ref 30.0–36.0)
MCV: 95 fL (ref 80.0–100.0)
Monocytes Absolute: 0.9 10*3/uL (ref 0.1–1.0)
Monocytes Relative: 9 %
Neutro Abs: 3.7 10*3/uL (ref 1.7–7.7)
Neutrophils Relative %: 39 %
Platelet Count: 301 10*3/uL (ref 150–400)
RBC: 4.24 MIL/uL (ref 3.87–5.11)
RDW: 13 % (ref 11.5–15.5)
WBC Count: 9.6 10*3/uL (ref 4.0–10.5)
nRBC: 0 % (ref 0.0–0.2)
nRBC: 0 /100{WBCs}

## 2023-03-13 LAB — CMP (CANCER CENTER ONLY)
ALT: 6 U/L (ref 0–44)
AST: 15 U/L (ref 15–41)
Albumin: 4.3 g/dL (ref 3.5–5.0)
Alkaline Phosphatase: 116 U/L (ref 38–126)
Anion gap: 9 (ref 5–15)
BUN: 17 mg/dL (ref 8–23)
CO2: 25 mmol/L (ref 22–32)
Calcium: 10 mg/dL (ref 8.9–10.3)
Chloride: 103 mmol/L (ref 98–111)
Creatinine: 0.79 mg/dL (ref 0.44–1.00)
GFR, Estimated: >60 mL/min (ref >60)
Glucose, Bld: 101 mg/dL — ABNORMAL HIGH (ref 70–99)
Potassium: 4.7 mmol/L (ref 3.5–5.1)
Sodium: 137 mmol/L (ref 135–145)
Total Bilirubin: 1.3 mg/dL — ABNORMAL HIGH (ref 0.0–1.2)
Total Protein: 7.2 g/dL (ref 6.5–8.1)

## 2023-03-13 LAB — TSH: TSH: 5.322 u[IU]/mL — ABNORMAL HIGH (ref 0.350–4.500)

## 2023-03-13 NOTE — Telephone Encounter (Signed)
-----   Message from [name] sent at 03/13/2023 10:39 AM EST ----- Elvina Sidle, do you know if you requested the [facility] report from [facility] in [place]? There are records and a note from Dr. Shyrl Numbers scanned into media saying we have the records they sent, we need the [facility] report. Thanks

## 2023-03-13 NOTE — Assessment & Plan Note (Addendum)
The TSH has fluctuated up and down.  She she is currently on levothyroxine 75 mcg daily.  Her last TSH was normal.  TSH is pending from today.

## 2023-03-13 NOTE — Assessment & Plan Note (Signed)
CT chest in December 2024 revealed multiple bilateral pulmonary nodules which increased in size and number since imaging in August 2024.  These measure up to 7 mm in diameter.  There was an area of pleural plaque in the posteriomedial left upper lobe, measuring 2.9 cm.  Prior PD-L1 was positive at 90%, so she resumed pembrolizumab and January.  She will proceed with a third cycle of pembrolizumab next week.  We will plan to see her back in 3 weeks with a CBC, comprehensive metabolic panel and TSH prior to a fourth cycle.  We have requested her [facility] testing from [place].

## 2023-03-13 NOTE — Assessment & Plan Note (Signed)
Resolved with potassium chloride 20 mEq daily.  As she developed worsening hypokalemia while on pembrolizumab in the past, I will have her continue her potassium supplement.

## 2023-03-13 NOTE — Progress Notes (Signed)
 [facility]  [address] 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  She was treated with left lower lobectomy in [place].  Her doctor there recommended chemotherapy with immunotherapy, but she refused chemotherapy.  PD-L1 was positive.  She was only willing to take the immunotherapy for 1 year. She started pembrolizumab in August 2023.  We saw her in October 2023, when she returned to [place] and continued pembrolizumab every 21 days. Routine CT imaging had not shown any evidence of recurrence.  She completed 17 cycles of adjuvant pembrolizumab in August and CT chest following that was negative for recurrence. There were stable scattered small bilateral pulmonary nodules, felt to be from benign emphysema.  Surgical changes were seen in the left lower lobe.   Unfortunately, she had evidence of recurrence on CT chest in December which revealed interval progression in the size and number of multiple bilateral pulmonary nodules most consistent with progression of metastatic disease. We decided not to pursue a biopsy in view of the difficulty involved. Treatment options were discussed and she wished not to pursue chemotherapy unless absolutely necessary.  She was placed back on pembrolizumab.   Malignant neoplasm metastatic to lung [facility]) CT chest in December 2024 revealed multiple bilateral pulmonary nodules which increased in size and number since imaging in August 2024.  These measure up to 7 mm in diameter.  There was an area of pleural plaque in the posteriomedial left upper lobe, measuring 2.9 cm.  Prior PD-L1 was positive at 90%, so she resumed pembrolizumab and January.  She will proceed with a third cycle of pembrolizumab next week.  We will plan to  see her back in 3 weeks with a CBC, comprehensive metabolic panel and TSH prior to a fourth cycle.  We have requested her [facility] testing from [place].   Hypothyroidism due to medication The TSH has fluctuated up and down.  She she is currently on levothyroxine 75 mcg daily.  Her last TSH was normal.  TSH is pending from today.  Hypokalemia Resolved with potassium chloride 20 mEq daily.  As she developed worsening hypokalemia while on pembrolizumab in the past, I will have her continue her potassium supplement.    The patient understands the plans discussed today and is in agreement with them.  She knows to contact our office if she develops concerns prior to her next appointment.   I provided 30 minutes of face-to-face time during this encounter and > 50% was spent counseling as documented under my assessment and plan.    [name], PA-C  [facility] [place] - [facility] [address] [place] [address] Dept: [phone] Dept Fax: [phone]   No orders of the defined types were placed in this encounter.     CHIEF COMPLAINT:  CC: Recurrent metastatic adenocarcinoma of the lung  Current Treatment: Pembrolizumab every 3 weeks  HISTORY OF PRESENT ILLNESS:   Oncology History  Non-small cell lung cancer, left (HCC)  01/15/2021 Initial Diagnosis   Non-small cell lung cancer, left (HCC)   02/12/2021 Cancer Staging   Staging form: Lung, AJCC 8th Edition - Clinical stage from 02/12/2021: Stage IA2 (cT1b, cN0, cM0) - Signed by Dellia Beckwith,  MD on 02/14/2021 Histopathologic type: Adenocarcinoma, NOS Stage prefix: Initial diagnosis Histologic grade (G): GX Histologic grading system: 4 grade system Laterality: Left Tumor size (mm): 19 Lymph-vascular invasion (LVI): LVI not present (absent)/not identified Diagnostic confirmation: Positive histology Specimen type: Bronchial Biopsy Staged by: Managing  physician Stage used in treatment planning: Yes National guidelines used in treatment planning: Yes Type of national guideline used in treatment planning: NCCN Staging comments: Rec surgical resection   05/11/2021 Cancer Staging   Staging form: Lung, AJCC 8th Edition - Pathologic stage from 05/11/2021: Stage IIB (ypT1c, pN1, cM0) - Signed by Dellia Beckwith, MD on 11/12/2021 Histopathologic type: Adenocarcinoma, NOS Stage prefix: Post-therapy Histologic grade (G): G2 Histologic grading system: 4 grade system Residual tumor (R): R0 - None Laterality: Left Tumor size (mm): 25 Lymph-vascular invasion (LVI): LVI not present (absent)/not identified Diagnostic confirmation: Positive histology PLUS positive immunophenotyping and/or positive genetic studies Specimen type: Excision Staged by: Managing physician Type of lung cancer: Surgically resected non-small cell lung cancer ECOG performance status: Grade 1 Perineural invasion (PNI): Absent Pleural/elastic layer invasion: PL0 Pleural lavage cytology: Unknown Weight loss: Absent Adequacy of mediastinal dissection: Adequate Radiotherapy dose: No Adjuvant radiation: No Adjuvant chemotherapy: Yes Stage used in treatment planning: Yes National guidelines used in treatment planning: Yes Type of national guideline used in treatment planning: NCCN Staging comments: Adjuvant pembrolizumab, PDL 1 90%   10/04/2021 - 10/01/2022 Chemotherapy   Patient is on Treatment Plan : LUNG NSCLC Pembrolizumab Adj q21d x 18 cycles     02/04/2023 -  Chemotherapy   Patient is on Treatment Plan : LUNG NSCLC Pembrolizumab (200) q21d         INTERVAL HISTORY:  Sydney Hunt is here today for repeat clinical assessment prior to third cycle of pembrolizumab.  She is generally feeling well.  She states she quit smoking again 2 weeks ago and has noted increased cough occasionally productive of clear sputum.  She denies hemoptysis.  She denies significant shortness of  breath.  She denies fevers or chills. She denies pain. Her appetite is good. Her weight has decreased 2 pounds over last 3 weeks .  REVIEW OF SYSTEMS:  Review of Systems  Constitutional:  Negative for appetite change, chills, diaphoresis, fatigue, fever and unexpected weight change.  HENT:   Negative for lump/mass, mouth sores, nosebleeds and sore throat.   Respiratory:  Positive for cough (occasional clear sputum). Negative for hemoptysis and shortness of breath.   Cardiovascular:  Negative for chest pain and leg swelling.  Gastrointestinal:  Negative for abdominal pain, constipation, diarrhea, nausea and vomiting.  Endocrine: Negative for hot flashes.  Genitourinary:  Negative for difficulty urinating, dysuria, frequency, hematuria and vaginal bleeding.   Musculoskeletal:  Negative for arthralgias, back pain and myalgias.  Skin:  Negative for rash.  Neurological:  Negative for dizziness and headaches.  Hematological:  Negative for adenopathy. Does not bruise/bleed easily.  Psychiatric/Behavioral:  Negative for depression and sleep disturbance. The patient is not nervous/anxious.      VITALS:  Blood pressure 118/71, pulse 78, temperature 98.6 F (37 C), temperature source Oral, resp. rate 18, height 4\' 11"  (1.499 m), weight 100 lb 9.6 oz (45.6 kg), SpO2 98%.  Wt Readings from Last 3 Encounters:  03/13/23 100 lb 9.6 oz (45.6 kg)  02/25/23 102 lb 0.6 oz (46.3 kg)  02/20/23 101 lb 3.2 oz (45.9 kg)    Body mass index is 20.32 kg/m.  Performance status (ECOG): 1 - Symptomatic but completely ambulatory  PHYSICAL EXAM:  Physical Exam Vitals and nursing note reviewed.  Constitutional:      General: She is not in acute distress.    Appearance: Normal appearance. She is underweight.  HENT:     Head: Normocephalic and atraumatic.     Mouth/Throat:     Mouth: Mucous membranes are moist.     Pharynx: Oropharynx is clear. No oropharyngeal exudate or posterior oropharyngeal erythema.   Eyes:     General: No scleral icterus.    Extraocular Movements: Extraocular movements intact.     Conjunctiva/sclera: Conjunctivae normal.     Pupils: Pupils are equal, round, and reactive to light.  Cardiovascular:     Rate and Rhythm: Normal rate and regular rhythm.     Heart sounds: Normal heart sounds. No murmur heard.    No friction rub. No gallop.  Pulmonary:     Effort: Pulmonary effort is normal.     Breath sounds: Normal breath sounds. No wheezing, rhonchi or rales.  Abdominal:     General: There is no distension.     Palpations: Abdomen is soft. There is no hepatomegaly, splenomegaly or mass.     Tenderness: There is no abdominal tenderness.  Musculoskeletal:        General: Normal range of motion.     Cervical back: Normal range of motion and neck supple. No tenderness.     Right lower leg: No edema.     Left lower leg: No edema.  Lymphadenopathy:     Cervical: No cervical adenopathy.     Upper Body:     Right upper body: No supraclavicular or axillary adenopathy.     Left upper body: No supraclavicular or axillary adenopathy.     Lower Body: No right inguinal adenopathy. No left inguinal adenopathy.  Skin:    General: Skin is warm and dry.     Coloration: Skin is not jaundiced.     Findings: No rash.  Neurological:     Mental Status: She is alert and oriented to person, place, and time.     Cranial Nerves: No cranial nerve deficit.  Psychiatric:        Mood and Affect: Mood normal.        Behavior: Behavior normal.        Thought Content: Thought content normal.     LABS:      Latest Ref Rng & Units 03/13/2023    9:39 AM 02/20/2023    1:01 PM 02/04/2023    9:07 AM  CBC  WBC 4.0 - 10.5 K/uL 9.6  11.6  10.7   Hemoglobin 12.0 - 15.0 g/dL 57.8  46.9  62.9   Hematocrit 36.0 - 46.0 % 40.3  38.5  41.3   Platelets 150 - 400 K/uL 301  274  286       Latest Ref Rng & Units 03/13/2023    9:39 AM 02/20/2023    1:01 PM 02/04/2023    9:07 AM  CMP  Glucose 70 - 99  mg/dL 528  413  96   BUN 8 - 23 mg/dL 17  17  15    Creatinine 0.44 - 1.00 mg/dL 2.44  0.10  2.72   Sodium 135 - 145 mmol/L 137  140  140   Potassium 3.5 - 5.1 mmol/L 4.7  4.2  4.5   Chloride 98 - 111 mmol/L 103  102  104   CO2 22 - 32 mmol/L 25  27  26    Calcium 8.9 - 10.3 mg/dL 10.0  10.1  10.1   Total Protein 6.5 - 8.1 g/dL 7.2  7.2  7.5   Total Bilirubin 0.0 - 1.2 mg/dL 1.3  1.1  1.8   Alkaline Phos 38 - 126 U/L 116  108  109   AST 15 - 41 U/L 15  17  15    ALT 0 - 44 U/L 6  11  11       Lab Results  Component Value Date   CEA1 5.3 (H) 10/17/2022   CEA 4.2 01/13/2023   /  CEA  Date Value Ref Range Status  01/13/2023 4.2  Final  10/17/2022 5.3 (H) 0.0 - 4.7 ng/mL Final    Comment:    (NOTE)                             Nonsmokers          <3.9                             Smokers             <5.6 Roche [employer] Electrochemiluminescence Immunoassay (ECLIA) Values obtained with different assay methods or kits cannot be used interchangeably.  Results cannot be interpreted as absolute evidence of the presence or absence of malignant disease. Performed At: [facility] [address] [place], [place] [id number] Jolene Schimke MD WU:[id number]     Lab Results  Component Value Date   LDH 130 11/28/2022    STUDIES:  No results found.    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 Report date is 12/12/2020.     The Multi-Cancer + RNA Panel offered by [facility] includes  sequencing and/or deletion/duplication analysis of the following 84 genes:  AIP*, ALK, APC*, ATM*, AXIN2*, BAP1*, BARD1*, BLM*, BMPR1A*, BRCA1*, BRCA2*, BRIP1*, CASR, CDC73*, CDH1*, CDK4, CDKN1B*, CDKN1C*, CDKN2A, CEBPA, CHEK2*, CTNNA1*, DICER1*, DIS3L2*, EGFR, EPCAM, FH   GERD (gastroesophageal reflux disease)    Heartburn 12/27/2021   Hormone disorder 11/20/2020   Hyperbilirubinemia 09/26/2022   Hypertension    Hypokalemia 12/27/2021   Hypothyroidism due to medication 01/22/2022   Leukocytosis 01/17/2022   Migraines    Nodule of left lung 11/21/2020   Non-small cell lung cancer, left (HCC) 01/15/2021   Port-A-Cath in place 09/05/2022   Tobacco use 12/07/2020    Past Surgical History:  Procedure Laterality Date   ABDOMINAL HYSTERECTOMY  1989   unsure of vaginal or abdominal   BRONCHIAL BIOPSY  01/15/2021   Procedure: BRONCHIAL BIOPSIES;  Surgeon: Leslye Peer, MD;  Location: [facility] ENDOSCOPY;  Service: Pulmonary;;   BRONCHIAL BRUSHINGS  01/15/2021   Procedure: BRONCHIAL BRUSHINGS;  Surgeon: Leslye Peer, MD;  Location: [facility] ENDOSCOPY;  Service: Pulmonary;;   BRONCHIAL NEEDLE ASPIRATION BIOPSY  01/15/2021   Procedure: BRONCHIAL NEEDLE ASPIRATION BIOPSIES;  Surgeon: Leslye Peer, MD;  Location: [facility] ENDOSCOPY;  Service: Pulmonary;;  COLONOSCOPY  2022   FIDUCIAL MARKER PLACEMENT  01/15/2021   Procedure: FIDUCIAL MARKER PLACEMENT;  Surgeon: Leslye Peer, MD;  Location: [facility] ENDOSCOPY;  Service: Pulmonary;;   ovaries removed  1986   surgery to remove scar tissue     from colon/bladder in patient's late 30's   TUBAL LIGATION  1986   UPPER GI ENDOSCOPY     years ago in patient's late 77s   VIDEO BRONCHOSCOPY WITH RADIAL ENDOBRONCHIAL ULTRASOUND  01/15/2021   Procedure: VIDEO BRONCHOSCOPY WITH RADIAL ENDOBRONCHIAL ULTRASOUND;  Surgeon: Leslye Peer, MD;  Location: [facility] ENDOSCOPY;  Service: Pulmonary;;    Family History  Problem Relation Age of Onset   Pancreatic cancer Maternal  Aunt 50   Brain cancer Maternal Uncle    Stomach cancer Maternal Uncle        dx. >50   Prostate cancer Half-Brother        passed away at 37   Prostate cancer Half-Brother     Social History:  reports that she quit smoking about 22 months ago. Her smoking use included cigarettes. She started smoking about 62 years ago. She has a 30.1 pack-year smoking history. She has never used smokeless tobacco. She reports current alcohol use of about 4.0 standard drinks of alcohol per week. She reports current drug use. Frequency: 3.00 times per week. Drug: Marijuana.The patient is alone today.  Allergies:  Allergies  Allergen Reactions   Codeine Rash   Demerol [Meperidine Hcl] Other (See Comments)    coma   Meperidine Other (See Comments)    Several days will pass without patient being aware   Other Dermatitis    Stainless steel / skin weeps   Silver Dermatitis    Sterling silver skin weeps   Gold Bond [Menthol-Zinc Oxide] Dermatitis    Skin weeps   Neosporin [Bacitracin-Polymyxin B] Dermatitis    Skin weeps   Wound Dressing Adhesive Other (See Comments)    CAUSES SKIN TEARS PER PATIENT. INSTEAD USE THE TEGADERM 28M WITH ADHESIVE FREE WINDOW OVER PORT.    Current Medications: Current Outpatient Medications  Medication Sig Dispense Refill   aspirin 81 MG EC tablet Take 81 mg by mouth daily.     atorvastatin (LIPITOR) 40 MG tablet Take 40 mg by mouth daily.     celecoxib (CELEBREX) 200 MG capsule Take 200 mg by mouth daily.     DAYVIGO 5 MG TABS Take 1 tablet by mouth at bedtime.     HYDROcodone-acetaminophen (NORCO/VICODIN) 5-325 MG tablet Take 1 tablet by mouth every 6 (six) hours as needed for moderate pain (pain score 4-6) or severe pain (pain score 7-10). 30 tablet 0   levothyroxine (SYNTHROID) 75 MCG tablet Take 1 tablet (75 mcg total) by mouth daily. 30 tablet 5   lidocaine-prilocaine (EMLA) cream Apply 1 Application topically as needed. 30 g 0   ondansetron (ZOFRAN) 4 MG tablet  Take 1 tablet (4 mg total) by mouth every 4 (four) hours as needed for nausea. 90 tablet 3   potassium chloride SA (KLOR-CON M) 20 MEQ tablet Take 3 tablets (60 mEq total) by mouth 2 (two) times daily. Taking 2 tablets in the morning and 2 tablets in the afternoon (Patient taking differently: Take 60 mEq by mouth 2 (two) times daily. Taking once a day) 180 tablet 3   Prenatal Vit-Fe Fumarate-FA (PRENATAL VITAMIN) 27-0.8 MG TABS Take 1 tablet by mouth daily. 30 tablet 5   prochlorperazine (COMPAZINE) 10 MG tablet TAKE 1 TABLET(10 MG)  BY MOUTH EVERY 6 HOURS AS NEEDED FOR NAUSEA OR VOMITING 360 tablet 0   No current facility-administered medications for this visit.

## 2023-03-13 NOTE — Assessment & Plan Note (Signed)
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  She was treated with left lower lobectomy in [place].  Her doctor there recommended chemotherapy with immunotherapy, but she refused chemotherapy.  PD-L1 was positive.  She was only willing to take the immunotherapy for 1 year. She started pembrolizumab in August 2023.  We saw her in October 2023, when she returned to [place] and continued pembrolizumab every 21 days. Routine CT imaging had not shown any evidence of recurrence.  She completed 17 cycles of adjuvant pembrolizumab in August and CT chest following that was negative for recurrence. There were stable scattered small bilateral pulmonary nodules, felt to be from benign emphysema.  Surgical changes were seen in the left lower lobe.   Unfortunately, she had evidence of recurrence on CT chest in December which revealed interval progression in the size and number of multiple bilateral pulmonary nodules most consistent with progression of metastatic disease. We decided not to pursue a biopsy in view of the difficulty involved. Treatment options were discussed and she wished not to pursue chemotherapy unless absolutely necessary.  She was placed back on pembrolizumab.

## 2023-03-13 NOTE — Telephone Encounter (Signed)
I have tried to call them to get the reports and they keep sending me the note and not the report. Tried to call daughter to get the report and unable to leave message. Can try daughter again to get report.

## 2023-03-13 NOTE — Telephone Encounter (Signed)
Patient has been scheduled for follow-up visit per 03/13/23 LOS.  Pt given an appt calendar with date and time.

## 2023-03-14 LAB — T4: T4, Total: 9.5 ug/dL (ref 4.5–12.0)

## 2023-03-15 ENCOUNTER — Other Ambulatory Visit: Payer: Self-pay

## 2023-03-18 ENCOUNTER — Other Ambulatory Visit: Payer: Medicare HMO

## 2023-03-18 ENCOUNTER — Ambulatory Visit: Payer: Medicare HMO | Admitting: Oncology

## 2023-03-18 ENCOUNTER — Ambulatory Visit: Payer: Medicare HMO

## 2023-03-18 ENCOUNTER — Inpatient Hospital Stay: Payer: 59

## 2023-03-18 VITALS — BP 103/70 | HR 79 | Temp 97.7°F | Resp 18 | Wt 100.0 lb

## 2023-03-18 DIAGNOSIS — Z5112 Encounter for antineoplastic immunotherapy: Secondary | ICD-10-CM | POA: Diagnosis not present

## 2023-03-18 DIAGNOSIS — C3492 Malignant neoplasm of unspecified part of left bronchus or lung: Secondary | ICD-10-CM

## 2023-03-18 MED ORDER — SODIUM CHLORIDE 0.9 % IV SOLN
200.0000 mg | Freq: Once | INTRAVENOUS | Status: AC
  Administered 2023-03-18: 200 mg via INTRAVENOUS
  Filled 2023-03-18: qty 8

## 2023-03-18 MED ORDER — SODIUM CHLORIDE 0.9% FLUSH
10.0000 mL | INTRAVENOUS | Status: DC | PRN
  Administered 2023-03-18: 10 mL

## 2023-03-18 MED ORDER — SODIUM CHLORIDE 0.9 % IV SOLN
INTRAVENOUS | Status: DC
Start: 2023-03-18 — End: 2023-03-18

## 2023-03-18 MED ORDER — HEPARIN SOD (PORK) LOCK FLUSH 100 UNIT/ML IV SOLN
500.0000 [IU] | Freq: Once | INTRAVENOUS | Status: AC | PRN
  Administered 2023-03-18: 500 [IU]

## 2023-03-18 NOTE — Patient Instructions (Signed)

## 2023-03-24 ENCOUNTER — Encounter: Payer: Self-pay | Admitting: Oncology

## 2023-04-01 NOTE — Progress Notes (Signed)
 [facility]  [address] 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  She was treated with left lower lobectomy in [place].  Her doctor there recommended chemotherapy with immunotherapy, but she refused chemotherapy.  PD-L1 was positive.  She was only willing to take the immunotherapy for 1 year. She started pembrolizumab in August 2023.  We saw her in October 2023, when she returned to [place] and continued pembrolizumab every 21 days. Routine CT imaging had not shown any evidence of recurrence.  She completed 17 cycles of adjuvant pembrolizumab in August and CT chest following that was negative for recurrence. There were stable scattered small bilateral pulmonary nodules, felt to be from benign emphysema.  Surgical changes were seen in the left lower lobe.    Unfortunately, she had evidence of recurrence on CT chest in December which revealed interval progression in the size and number of multiple bilateral pulmonary nodules most consistent with progression of metastatic disease. We decided not to pursue a biopsy in view of the difficulty involved. Treatment options were discussed and she wished not to pursue chemotherapy unless absolutely necessary.  She was placed back on pembrolizumab.     Malignant neoplasm metastatic to lung [facility]) CT chest in December 2024 revealed multiple bilateral pulmonary nodules which increased in size and number since imaging in August 2024.  These measure up to 7 mm in diameter.  There was an area of pleural plaque in the posteriomedial left upper lobe, measuring 2.9 cm.  Prior PD-L1 was positive at 90%, so she resumed pembrolizumab in January.  She will proceed with a third cycle of pembrolizumab next week.  We have requested her [employer]  testing from [place].    Hypothyroidism due to medication The TSH has fluctuated up and down.  She is currently on levothyroxine 75 mcg daily.  Her last TSH was slightly elevated at 5.322.  TSH is pending from today.   Hypokalemia Resolved with potassium chloride 20 mEq daily.  As she developed worsening hypokalemia while on pembrolizumab in the past, I will have her continue her potassium supplement.   Plan: She inquired about MiraLAX for her constipation since the Dulcolax is not controlling this. I recommended she take this every day on top of her Dulcolax pill. She inquired about a 'Spoiled Child', a liquid collagen preparation that her daughter gave her and I ran this by our pharmacist and we feel this is safe as it mainly consists of collagen and amino acids.  Her day 1 cycle 4 of Keytruda is scheduled on 04/07/2023. She has a elevated WBC of 11.5 with elevated eosinophils of 27%, hemoglobin of 14.2, and platelet count of 314,000. Her CMP is completely normal today. I will see her back in 3 weeks with CBC, CMP, and CT chest 1 week before her appointment with me. The patient understands the plans discussed today and is in agreement with them.  She knows to contact our office if she develops concerns prior to her next appointment.  I provided 18 minutes of face-to-face time during this encounter and > 50% was spent counseling as documented under my assessment and plan.   Dellia Beckwith, MD  [facility] [place] - A DEPT OF [facility] [address]  [place] [address] Dept: [phone] Dept Fax: [phone]   No orders of the defined types were placed in this encounter.   CHIEF COMPLAINT:  CC: Recurrent metastatic adenocarcinoma of the lung  Current Treatment: Pembrolizumab every 3 weeks  HISTORY OF PRESENT ILLNESS:   Oncology History  Non-small cell lung cancer, left (HCC)  01/15/2021 Initial Diagnosis   Non-small cell  lung cancer, left (HCC)   02/12/2021 Cancer Staging   Staging form: Lung, AJCC 8th Edition - Clinical stage from 02/12/2021: Stage IA2 (cT1b, cN0, cM0) - Signed by Dellia Beckwith, MD on 02/14/2021 Histopathologic type: Adenocarcinoma, NOS Stage prefix: Initial diagnosis Histologic grade (G): GX Histologic grading system: 4 grade system Laterality: Left Tumor size (mm): 19 Lymph-vascular invasion (LVI): LVI not present (absent)/not identified Diagnostic confirmation: Positive histology Specimen type: Bronchial Biopsy Staged by: Managing physician Stage used in treatment planning: Yes National guidelines used in treatment planning: Yes Type of national guideline used in treatment planning: NCCN Staging comments: Rec surgical resection   05/11/2021 Cancer Staging   Staging form: Lung, AJCC 8th Edition - Pathologic stage from 05/11/2021: Stage IIB (ypT1c, pN1, cM0) - Signed by Dellia Beckwith, MD on 11/12/2021 Histopathologic type: Adenocarcinoma, NOS Stage prefix: Post-therapy Histologic grade (G): G2 Histologic grading system: 4 grade system Residual tumor (R): R0 - None Laterality: Left Tumor size (mm): 25 Lymph-vascular invasion (LVI): LVI not present (absent)/not identified Diagnostic confirmation: Positive histology PLUS positive immunophenotyping and/or positive genetic studies Specimen type: Excision Staged by: Managing physician Type of lung cancer: Surgically resected non-small cell lung cancer ECOG performance status: Grade 1 Perineural invasion (PNI): Absent Pleural/elastic layer invasion: PL0 Pleural lavage cytology: Unknown Weight loss: Absent Adequacy of mediastinal dissection: Adequate Radiotherapy dose: No Adjuvant radiation: No Adjuvant chemotherapy: Yes Stage used in treatment planning: Yes National guidelines used in treatment planning: Yes Type of national guideline used in treatment planning: NCCN Staging comments: Adjuvant pembrolizumab, PDL 1  90%   10/04/2021 - 10/01/2022 Chemotherapy   Patient is on Treatment Plan : LUNG NSCLC Pembrolizumab Adj q21d x 18 cycles     02/04/2023 -  Chemotherapy   Patient is on Treatment Plan : LUNG NSCLC Pembrolizumab (200) q21d       INTERVAL HISTORY:  Sydney Hunt is here today for repeat clinical for her recurrent metastatic adenocarcinoma of the lung. Patient states that she feels ok but complains of allergies and constipation. She has been taking 1 Dulcolax pill about every 6 hours and this has not given her much relief. She inquired about MiraLAX and I recommended she take this everyday on top of her Dulcolax pill. She inquired about "Spoiled Child", a liquid collagen preparation that her daughter gave her and I ran this by our pharmacist and we both feel this is safe to try. This is mainly a combination of collagen and amino acids. Her day 1 cycle 4 of Keytruda is scheduled on 04/07/2023. She has a elevated WBC of 11.5 with elevated eosinophils of 27%, hemoglobin of 14.2, and platelet count of 314,000. Her CMP is completely normal today. I will see her back in 3 weeks with CBC, CMP, and CT chest 1 week before her appointment with me.   She denies signs of infection such as sore throat, sinus drainage, or urinary symptoms.  She denies fevers or recurrent chills. She denies pain. She denies nausea, vomiting, chest pain, dyspnea. Her appetite is ok and her weight has decreased 2 pounds over last 2 weeks .   REVIEW OF SYSTEMS:  Review of Systems  Constitutional: Negative.  Negative for appetite change, chills, diaphoresis, fatigue, fever and unexpected weight change.  HENT:   Positive for hearing loss (hearing aid). Negative for lump/mass, mouth sores, nosebleeds, sore throat, tinnitus, trouble swallowing and voice change.   Eyes: Negative.  Negative for eye problems and icterus.  Respiratory:  Positive for cough (occasional clear sputum). Negative for chest tightness, hemoptysis, shortness of breath and wheezing.    Cardiovascular: Negative.  Negative for chest pain, leg swelling and palpitations.  Gastrointestinal: Negative.  Negative for abdominal distention, abdominal pain, blood in stool, constipation, diarrhea, nausea, rectal pain and vomiting.  Endocrine: Negative.  Negative for hot flashes.  Genitourinary: Negative.  Negative for bladder incontinence, difficulty urinating, dyspareunia, dysuria, frequency, hematuria, menstrual problem, nocturia, pelvic pain, vaginal bleeding and vaginal discharge.   Musculoskeletal: Negative.  Negative for arthralgias, back pain, flank pain, gait problem, myalgias, neck pain and neck stiffness.  Skin: Negative.  Negative for itching, rash and wound.  Neurological:  Negative for dizziness, extremity weakness, gait problem, headaches, light-headedness, numbness, seizures and speech difficulty.  Hematological: Negative.  Negative for adenopathy. Does not bruise/bleed easily.  Psychiatric/Behavioral: Negative.  Negative for confusion, decreased concentration, depression, sleep disturbance and suicidal ideas. The patient is not nervous/anxious.    VITALS:  Blood pressure 138/75, pulse 76, temperature 98.2 F (36.8 C), temperature source Oral, resp. rate 19, height 4\' 11"  (1.499 m), weight 98 lb 12.8 oz (44.8 kg), SpO2 96%.  Wt Readings from Last 3 Encounters:  04/07/23 97 lb 1.9 oz (44.1 kg)  04/03/23 98 lb 12.8 oz (44.8 kg)  03/18/23 100 lb (45.4 kg)    Body mass index is 19.96 kg/m.  Performance status (ECOG): 1 - Symptomatic but completely ambulatory  PHYSICAL EXAM:  Physical Exam Vitals and nursing note reviewed.  Constitutional:      General: She is not in acute distress.    Appearance: Normal appearance. She is normal weight. She is not ill-appearing, toxic-appearing or diaphoretic.  HENT:     Head: Normocephalic and atraumatic.     Right Ear: Tympanic membrane, ear canal and external ear normal. There is no impacted cerumen.     Left Ear: Tympanic  membrane, ear canal and external ear normal. There is no impacted cerumen.     Nose: Nose normal. No congestion or rhinorrhea.     Mouth/Throat:     Mouth: Mucous membranes are moist.     Pharynx: Oropharynx is clear. No oropharyngeal exudate or posterior oropharyngeal erythema.  Eyes:     General: No scleral icterus.       Right eye: No discharge.        Left eye: No discharge.     Extraocular Movements: Extraocular movements intact.     Conjunctiva/sclera: Conjunctivae normal.     Pupils: Pupils are equal, round, and reactive to light.  Neck:     Vascular: No carotid bruit.  Cardiovascular:     Rate and Rhythm: Normal rate and regular rhythm.     Pulses: Normal pulses.     Heart sounds: Normal heart sounds. No murmur heard.    No friction rub. No gallop.  Pulmonary:     Effort: Pulmonary effort is normal. No respiratory distress.     Breath sounds: Normal breath sounds. No stridor. No wheezing, rhonchi or rales.  Chest:     Chest wall: No tenderness.  Abdominal:     General: Bowel sounds are normal. There is no distension.  Palpations: Abdomen is soft. There is no hepatomegaly, splenomegaly or mass.     Tenderness: There is no abdominal tenderness. There is no right CVA tenderness, left CVA tenderness, guarding or rebound.     Hernia: No hernia is present.  Musculoskeletal:        General: No swelling, tenderness, deformity or signs of injury. Normal range of motion.     Cervical back: Normal range of motion and neck supple. No rigidity or tenderness.     Right lower leg: No edema.     Left lower leg: No edema.  Feet:     Right foot:     Skin integrity: No blister.     Left foot:     Skin integrity: No blister.  Lymphadenopathy:     Cervical: No cervical adenopathy.     Right cervical: No superficial, deep or posterior cervical adenopathy.    Left cervical: No superficial, deep or posterior cervical adenopathy.     Upper Body:     Right upper body: No supraclavicular,  axillary or pectoral adenopathy.     Left upper body: No supraclavicular, axillary or pectoral adenopathy.     Lower Body: No right inguinal adenopathy. No left inguinal adenopathy.  Skin:    General: Skin is warm and dry.     Coloration: Skin is not jaundiced or pale.     Findings: No bruising, erythema, lesion or rash.  Neurological:     General: No focal deficit present.     Mental Status: She is alert and oriented to person, place, and time. Mental status is at baseline.     Cranial Nerves: No cranial nerve deficit.     Sensory: No sensory deficit.     Motor: No weakness.     Coordination: Coordination normal.     Gait: Gait normal.     Deep Tendon Reflexes: Reflexes normal.  Psychiatric:        Mood and Affect: Mood normal.        Behavior: Behavior normal.        Thought Content: Thought content normal.        Judgment: Judgment normal.     LABS:      Latest Ref Rng & Units 04/03/2023    8:36 AM 03/13/2023    9:39 AM 02/20/2023    1:01 PM  CBC  WBC 4.0 - 10.5 K/uL 11.5  9.6  11.6   Hemoglobin 12.0 - 15.0 g/dL 95.6  21.3  08.6   Hematocrit 36.0 - 46.0 % 41.0  40.3  38.5   Platelets 150 - 400 K/uL 314  301  274       Latest Ref Rng & Units 04/03/2023    8:36 AM 03/13/2023    9:39 AM 02/20/2023    1:01 PM  CMP  Glucose 70 - 99 mg/dL 99  578  469   BUN 8 - 23 mg/dL 17  17  17    Creatinine 0.44 - 1.00 mg/dL 6.29  5.28  4.13   Sodium 135 - 145 mmol/L 140  137  140   Potassium 3.5 - 5.1 mmol/L 4.5  4.7  4.2   Chloride 98 - 111 mmol/L 105  103  102   CO2 22 - 32 mmol/L 26  25  27    Calcium 8.9 - 10.3 mg/dL 9.7  24.4  01.0   Total Protein 6.5 - 8.1 g/dL 7.7  7.2  7.2   Total Bilirubin 0.0 - 1.2 mg/dL 1.1  1.3  1.1   Alkaline Phos 38 - 126 U/L 105  116  108   AST 15 - 41 U/L 15  15  17    ALT 0 - 44 U/L 6  6  11     Lab Results  Component Value Date   CEA1 5.3 (H) 10/17/2022   CEA 4.2 01/13/2023   /  CEA  Date Value Ref Range Status  01/13/2023 4.2  Final   10/17/2022 5.3 (H) 0.0 - 4.7 ng/mL Final    Comment:    (NOTE)                             Nonsmokers          <3.9                             Smokers             <5.6 Roche [employer] Electrochemiluminescence Immunoassay (ECLIA) Values obtained with different assay methods or kits cannot be used interchangeably.  Results cannot be interpreted as absolute evidence of the presence or absence of malignant disease. 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 Report date is 12/12/2020.     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   Leukocytosis 01/17/2022   Migraines    Nodule of left lung 11/21/2020   Non-small cell lung  cancer, left (HCC) 01/15/2021   Port-A-Cath in place 09/05/2022   Tobacco use 12/07/2020    Past Surgical History:  Procedure Laterality Date   ABDOMINAL HYSTERECTOMY  1989   unsure of vaginal or abdominal   BRONCHIAL BIOPSY  01/15/2021   Procedure: BRONCHIAL BIOPSIES;  Surgeon: Leslye Peer, MD;  Location: [facility] ENDOSCOPY;  Service: Pulmonary;;   BRONCHIAL BRUSHINGS  01/15/2021   Procedure: BRONCHIAL BRUSHINGS;  Surgeon: Leslye Peer, MD;  Location: [facility] ENDOSCOPY;  Service: Pulmonary;;   BRONCHIAL NEEDLE ASPIRATION BIOPSY  01/15/2021   Procedure: BRONCHIAL NEEDLE ASPIRATION BIOPSIES;  Surgeon: Leslye Peer, MD;  Location: [facility] ENDOSCOPY;  Service: Pulmonary;;   COLONOSCOPY  2022   FIDUCIAL MARKER PLACEMENT  01/15/2021  Procedure: FIDUCIAL MARKER PLACEMENT;  Surgeon: Leslye Peer, MD;  Location: [facility] ENDOSCOPY;  Service: Pulmonary;;   ovaries removed  1986   surgery to remove scar tissue     from colon/bladder in patient's late 30's   TUBAL LIGATION  1986   UPPER GI ENDOSCOPY     years ago in patient's late 65s   VIDEO BRONCHOSCOPY WITH RADIAL ENDOBRONCHIAL ULTRASOUND  01/15/2021   Procedure: VIDEO BRONCHOSCOPY WITH RADIAL ENDOBRONCHIAL ULTRASOUND;  Surgeon: Leslye Peer, MD;  Location: [facility] ENDOSCOPY;  Service: Pulmonary;;    Family History  Problem Relation Age of Onset   Pancreatic cancer Maternal Aunt 85   Brain cancer Maternal Uncle    Stomach cancer Maternal Uncle        dx. >50   Prostate cancer Half-Brother        passed away at 78   Prostate cancer Half-Brother     Social History:  reports that she quit smoking about 1 years ago. Her smoking use included cigarettes. She started smoking about 62 years ago. She has a 30.1 pack-year smoking history. She has never used smokeless tobacco. She reports current alcohol use of about 4.0 standard drinks of alcohol per week. She reports current drug use. Frequency: 3.00 times per week. Drug: Marijuana.The patient is alone  today.  Allergies:  Allergies  Allergen Reactions   Codeine Rash   Demerol [Meperidine Hcl] Other (See Comments)    coma   Meperidine Other (See Comments)    Several days will pass without patient being aware   Other Dermatitis    Stainless steel / skin weeps   Silver Dermatitis    Sterling silver skin weeps   Gold Bond [Menthol-Zinc Oxide] Dermatitis    Skin weeps   Neosporin [Bacitracin-Polymyxin B] Dermatitis    Skin weeps   Wound Dressing Adhesive Other (See Comments)    CAUSES SKIN TEARS PER PATIENT. INSTEAD USE THE TEGADERM 76M WITH ADHESIVE FREE WINDOW OVER PORT.    Current Medications: Current Outpatient Medications  Medication Sig Dispense Refill   aspirin 81 MG EC tablet Take 81 mg by mouth daily.     atorvastatin (LIPITOR) 40 MG tablet Take 40 mg by mouth daily.     celecoxib (CELEBREX) 200 MG capsule Take 200 mg by mouth daily.     DAYVIGO 5 MG TABS Take 1 tablet by mouth at bedtime.     HYDROcodone-acetaminophen (NORCO/VICODIN) 5-325 MG tablet Take 1 tablet by mouth every 6 (six) hours as needed for moderate pain (pain score 4-6) or severe pain (pain score 7-10). 30 tablet 0   levothyroxine (SYNTHROID) 75 MCG tablet Take 1 tablet (75 mcg total) by mouth daily. 30 tablet 5   lidocaine-prilocaine (EMLA) cream Apply 1 Application topically as needed. 30 g 0   ondansetron (ZOFRAN) 4 MG tablet Take 1 tablet (4 mg total) by mouth every 4 (four) hours as needed for nausea. 90 tablet 3   potassium chloride SA (KLOR-CON M) 20 MEQ tablet Take 3 tablets (60 mEq total) by mouth 2 (two) times daily. Taking 2 tablets in the morning and 2 tablets in the afternoon (Patient taking differently: Take 60 mEq by mouth 2 (two) times daily. Taking once a day) 180 tablet 3   Prenatal Vit-Fe Fumarate-FA (PRENATAL VITAMIN) 27-0.8 MG TABS Take 1 tablet by mouth daily. 30 tablet 5   prochlorperazine (COMPAZINE) 10 MG tablet TAKE 1 TABLET(10 MG) BY MOUTH EVERY 6 HOURS AS NEEDED FOR NAUSEA OR  VOMITING 360  tablet 0   No current facility-administered medications for this visit.    I,Jasmine M Lassiter,acting as a scribe for Dellia Beckwith, MD.,have documented all relevant documentation on the behalf of Dellia Beckwith, MD,as directed by  Dellia Beckwith, MD while in the presence of Dellia Beckwith, MD.

## 2023-04-03 ENCOUNTER — Inpatient Hospital Stay: Payer: 59 | Attending: Hematology and Oncology

## 2023-04-03 ENCOUNTER — Ambulatory Visit

## 2023-04-03 ENCOUNTER — Telehealth: Payer: Self-pay | Admitting: Oncology

## 2023-04-03 ENCOUNTER — Inpatient Hospital Stay (HOSPITAL_BASED_OUTPATIENT_CLINIC_OR_DEPARTMENT_OTHER): Payer: 59 | Admitting: Oncology

## 2023-04-03 ENCOUNTER — Encounter: Payer: Self-pay | Admitting: Oncology

## 2023-04-03 ENCOUNTER — Other Ambulatory Visit: Payer: Self-pay | Admitting: Oncology

## 2023-04-03 VITALS — BP 138/75 | HR 76 | Temp 98.2°F | Resp 19 | Ht 59.0 in | Wt 98.8 lb

## 2023-04-03 DIAGNOSIS — J439 Emphysema, unspecified: Secondary | ICD-10-CM | POA: Insufficient documentation

## 2023-04-03 DIAGNOSIS — Z5112 Encounter for antineoplastic immunotherapy: Secondary | ICD-10-CM | POA: Insufficient documentation

## 2023-04-03 DIAGNOSIS — F1290 Cannabis use, unspecified, uncomplicated: Secondary | ICD-10-CM | POA: Diagnosis not present

## 2023-04-03 DIAGNOSIS — C3432 Malignant neoplasm of lower lobe, left bronchus or lung: Secondary | ICD-10-CM | POA: Diagnosis present

## 2023-04-03 DIAGNOSIS — I10 Essential (primary) hypertension: Secondary | ICD-10-CM | POA: Diagnosis not present

## 2023-04-03 DIAGNOSIS — Z90722 Acquired absence of ovaries, bilateral: Secondary | ICD-10-CM | POA: Diagnosis not present

## 2023-04-03 DIAGNOSIS — Z800 Family history of malignant neoplasm of digestive organs: Secondary | ICD-10-CM | POA: Insufficient documentation

## 2023-04-03 DIAGNOSIS — R059 Cough, unspecified: Secondary | ICD-10-CM | POA: Diagnosis not present

## 2023-04-03 DIAGNOSIS — E876 Hypokalemia: Secondary | ICD-10-CM | POA: Diagnosis not present

## 2023-04-03 DIAGNOSIS — C3492 Malignant neoplasm of unspecified part of left bronchus or lung: Secondary | ICD-10-CM

## 2023-04-03 DIAGNOSIS — Z90710 Acquired absence of both cervix and uterus: Secondary | ICD-10-CM | POA: Diagnosis not present

## 2023-04-03 DIAGNOSIS — D72829 Elevated white blood cell count, unspecified: Secondary | ICD-10-CM | POA: Diagnosis not present

## 2023-04-03 DIAGNOSIS — Z79899 Other long term (current) drug therapy: Secondary | ICD-10-CM | POA: Insufficient documentation

## 2023-04-03 DIAGNOSIS — C7801 Secondary malignant neoplasm of right lung: Secondary | ICD-10-CM | POA: Diagnosis not present

## 2023-04-03 DIAGNOSIS — H9190 Unspecified hearing loss, unspecified ear: Secondary | ICD-10-CM | POA: Insufficient documentation

## 2023-04-03 DIAGNOSIS — E032 Hypothyroidism due to medicaments and other exogenous substances: Secondary | ICD-10-CM | POA: Insufficient documentation

## 2023-04-03 DIAGNOSIS — K5900 Constipation, unspecified: Secondary | ICD-10-CM | POA: Diagnosis not present

## 2023-04-03 DIAGNOSIS — Z885 Allergy status to narcotic agent status: Secondary | ICD-10-CM | POA: Insufficient documentation

## 2023-04-03 DIAGNOSIS — Z8042 Family history of malignant neoplasm of prostate: Secondary | ICD-10-CM | POA: Diagnosis not present

## 2023-04-03 DIAGNOSIS — Z95828 Presence of other vascular implants and grafts: Secondary | ICD-10-CM

## 2023-04-03 DIAGNOSIS — C7802 Secondary malignant neoplasm of left lung: Secondary | ICD-10-CM

## 2023-04-03 DIAGNOSIS — Z87891 Personal history of nicotine dependence: Secondary | ICD-10-CM | POA: Diagnosis not present

## 2023-04-03 LAB — CBC WITH DIFFERENTIAL (CANCER CENTER ONLY)
Abs Immature Granulocytes: 0 10*3/uL (ref 0.00–0.07)
Basophils Absolute: 0.2 10*3/uL — ABNORMAL HIGH (ref 0.0–0.1)
Basophils Relative: 2 %
Eosinophils Absolute: 3.1 10*3/uL — ABNORMAL HIGH (ref 0.0–0.5)
Eosinophils Relative: 27 %
HCT: 41 % (ref 36.0–46.0)
Hemoglobin: 14.2 g/dL (ref 12.0–15.0)
Immature Granulocytes: 0 %
Lymphocytes Relative: 24 %
Lymphs Abs: 2.8 10*3/uL (ref 0.7–4.0)
MCH: 33 pg (ref 26.0–34.0)
MCHC: 34.6 g/dL (ref 30.0–36.0)
MCV: 95.3 fL (ref 80.0–100.0)
Monocytes Absolute: 1 10*3/uL (ref 0.1–1.0)
Monocytes Relative: 9 %
Neutro Abs: 4.4 10*3/uL (ref 1.7–7.7)
Neutrophils Relative %: 38 %
Platelet Count: 314 10*3/uL (ref 150–400)
RBC: 4.3 MIL/uL (ref 3.87–5.11)
RDW: 13.1 % (ref 11.5–15.5)
Smear Review: NORMAL
WBC Count: 11.5 10*3/uL — ABNORMAL HIGH (ref 4.0–10.5)
nRBC: 0 % (ref 0.0–0.2)
nRBC: 0 /100{WBCs}

## 2023-04-03 LAB — CMP (CANCER CENTER ONLY)
ALT: 6 U/L (ref 0–44)
AST: 15 U/L (ref 15–41)
Albumin: 4.1 g/dL (ref 3.5–5.0)
Alkaline Phosphatase: 105 U/L (ref 38–126)
Anion gap: 9 (ref 5–15)
BUN: 17 mg/dL (ref 8–23)
CO2: 26 mmol/L (ref 22–32)
Calcium: 9.7 mg/dL (ref 8.9–10.3)
Chloride: 105 mmol/L (ref 98–111)
Creatinine: 0.79 mg/dL (ref 0.44–1.00)
GFR, Estimated: >60 mL/min (ref >60)
Glucose, Bld: 99 mg/dL (ref 70–99)
Potassium: 4.5 mmol/L (ref 3.5–5.1)
Sodium: 140 mmol/L (ref 135–145)
Total Bilirubin: 1.1 mg/dL (ref 0.0–1.2)
Total Protein: 7.7 g/dL (ref 6.5–8.1)

## 2023-04-03 MED ORDER — SODIUM CHLORIDE 0.9% FLUSH
10.0000 mL | INTRAVENOUS | Status: DC | PRN
  Administered 2023-04-03: 10 mL

## 2023-04-03 MED ORDER — HEPARIN SOD (PORK) LOCK FLUSH 100 UNIT/ML IV SOLN
500.0000 [IU] | Freq: Once | INTRAVENOUS | Status: AC | PRN
  Administered 2023-04-03: 500 [IU]

## 2023-04-03 NOTE — Telephone Encounter (Signed)
 04/03/23 Spoke with patient and confirmed next appt.

## 2023-04-04 ENCOUNTER — Other Ambulatory Visit: Payer: Self-pay

## 2023-04-07 ENCOUNTER — Inpatient Hospital Stay: Payer: 59

## 2023-04-07 VITALS — BP 151/83 | HR 75 | Temp 98.2°F | Resp 20 | Ht 59.0 in | Wt 97.1 lb

## 2023-04-07 DIAGNOSIS — C3492 Malignant neoplasm of unspecified part of left bronchus or lung: Secondary | ICD-10-CM

## 2023-04-07 DIAGNOSIS — Z5112 Encounter for antineoplastic immunotherapy: Secondary | ICD-10-CM | POA: Diagnosis not present

## 2023-04-07 MED ORDER — SODIUM CHLORIDE 0.9 % IV SOLN: INTRAVENOUS | Status: DC

## 2023-04-07 MED ORDER — SODIUM CHLORIDE 0.9 % IV SOLN
200.0000 mg | Freq: Once | INTRAVENOUS | Status: AC
  Administered 2023-04-07: 200 mg via INTRAVENOUS
  Filled 2023-04-07: qty 8

## 2023-04-07 MED ORDER — HEPARIN SOD (PORK) LOCK FLUSH 100 UNIT/ML IV SOLN
500.0000 [IU] | Freq: Once | INTRAVENOUS | Status: AC | PRN
  Administered 2023-04-07: 500 [IU]

## 2023-04-07 MED ORDER — SODIUM CHLORIDE 0.9% FLUSH
10.0000 mL | INTRAVENOUS | Status: DC | PRN
  Administered 2023-04-07: 10 mL

## 2023-04-07 NOTE — Patient Instructions (Signed)

## 2023-04-15 ENCOUNTER — Encounter: Payer: Self-pay | Admitting: Oncology

## 2023-04-17 NOTE — Progress Notes (Addendum)
 ADDENDUM: I now have the radiology report on Sydney Hunt's CT chest from 04/24/23 and it describes progression of disease.  The mediastinal and hilar nodes are mildly larger and the pulmonary nodules are minimally larger. There is still a subpleural opacity which may be atelectasis or evolving scar tissue. When I review the images, the differences are minimal.  I called Sydney Hunt and discussed the report. In view of only slight change and the fact that she feels strongly against taking chemotherapy, we will continue the immunotherapy and re-scan in 3 months.    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  She was treated with left lower lobectomy in [place].  Her doctor there recommended chemotherapy with immunotherapy, but she refused chemotherapy.  PD-L1 was positive.  She was only willing to take the immunotherapy for 1 year. She started pembrolizumab in August 2023.  We saw her in October 2023, when she returned to [place] and continued pembrolizumab every 21 days. Routine CT imaging had not shown any evidence of recurrence.  She completed 17 cycles of adjuvant pembrolizumab in August and CT chest following that was negative for recurrence. There were stable scattered small bilateral pulmonary nodules, felt to be from benign emphysema.  Surgical changes were seen in the left lower lobe.    Unfortunately, she had evidence of recurrence on CT chest in December which revealed interval progression in the size and number of multiple bilateral small pulmonary nodules most consistent with progression of metastatic disease. We decided not to pursue a biopsy in view of the difficulty involved. Treatment options were discussed and she wished not to pursue chemotherapy  unless absolutely necessary.  She was placed back on pembrolizumab and had a repeat CT this week.   Malignant neoplasm metastatic to lung [facility]) CT chest in December 2024 revealed multiple bilateral pulmonary nodules which increased in size and number since imaging in August 2024.  These measure up to 7 mm in diameter.  There was an area of pleural plaque in the posteriomedial left upper lobe, measuring 2.9 cm.  Prior PD-L1 was positive at 90%, so she resumed pembrolizumab in January.  We don't have the radiologist's report on her current CT but I reviewed the images with her and her son, and the disease seems minimal.   Hypothyroidism due to medication The TSH has fluctuated up and down.  She is currently on levothyroxine 75 mcg daily.  Her last TSH was slightly elevated at 5.322.  TSH is pending from today.   Hypokalemia Resolved with potassium chloride 20 mEq daily.  As she developed worsening hypokalemia while on pembrolizumab in the past, I will have her continue her potassium.   Plan: She had a CT chest done on 04/24/2023 and results are pending. We reviewed the images in detail and I feel this looks good, but will call her with the final results. She appears to have minimal disease. Her day 1 cycle 5 of Keytruda is scheduled on 04/29/2023. She had a elevated WBC of 12.5 with elevated eosinophils of 25% up from 11.5, hemoglobin of 15.3 up from 14.2, and platelet count of 334,000. Her CMP was normal other than a elevated total bilirubin of 1.3 up from 1.1.  She informed me on May the 18th she  will be in [place] and we will plan her treatment accordingly. I will see her back on 3 weeks with CBC and CMP. The patient understands the plans discussed today and is in agreement with them.  She knows to contact our office if she develops concerns prior to her next appointment.  I provided 22 minutes of face-to-face time during this encounter and > 50% was spent counseling as documented under my  assessment and plan.   Dellia Beckwith, MD  [facility] - A DEPT OF [facility] [address] [place] [address] Dept: [phone] Dept Fax: [phone]   No orders of the defined types were placed in this encounter.   CHIEF COMPLAINT:  CC: Recurrent metastatic adenocarcinoma of the lung  Current Treatment: Pembrolizumab every 3 weeks  HISTORY OF PRESENT ILLNESS:   Oncology History  Non-small cell lung cancer, left (HCC)  01/15/2021 Initial Diagnosis   Non-small cell lung cancer, left (HCC)   02/12/2021 Cancer Staging   Staging form: Lung, AJCC 8th Edition - Clinical stage from 02/12/2021: Stage IA2 (cT1b, cN0, cM0) - Signed by Dellia Beckwith, MD on 02/14/2021 Histopathologic type: Adenocarcinoma, NOS Stage prefix: Initial diagnosis Histologic grade (G): GX Histologic grading system: 4 grade system Laterality: Left Tumor size (mm): 19 Lymph-vascular invasion (LVI): LVI not present (absent)/not identified Diagnostic confirmation: Positive histology Specimen type: Bronchial Biopsy Staged by: Managing physician Stage used in treatment planning: Yes National guidelines used in treatment planning: Yes Type of national guideline used in treatment planning: NCCN Staging comments: Rec surgical resection   05/11/2021 Cancer Staging   Staging form: Lung, AJCC 8th Edition - Pathologic stage from 05/11/2021: Stage IIB (ypT1c, pN1, cM0) - Signed by Dellia Beckwith, MD on 11/12/2021 Histopathologic type: Adenocarcinoma, NOS Stage prefix: Post-therapy Histologic grade (G): G2 Histologic grading system: 4 grade system Residual tumor (R): R0 - None Laterality: Left Tumor size (mm): 25 Lymph-vascular invasion (LVI): LVI not present (absent)/not identified Diagnostic confirmation: Positive histology PLUS positive immunophenotyping and/or positive genetic studies Specimen type: Excision Staged by: Managing  physician Type of lung cancer: Surgically resected non-small cell lung cancer ECOG performance status: Grade 1 Perineural invasion (PNI): Absent Pleural/elastic layer invasion: PL0 Pleural lavage cytology: Unknown Weight loss: Absent Adequacy of mediastinal dissection: Adequate Radiotherapy dose: No Adjuvant radiation: No Adjuvant chemotherapy: Yes Stage used in treatment planning: Yes National guidelines used in treatment planning: Yes Type of national guideline used in treatment planning: NCCN Staging comments: Adjuvant pembrolizumab, PDL 1 90%   10/04/2021 - 10/01/2022 Chemotherapy   Patient is on Treatment Plan : LUNG NSCLC Pembrolizumab Adj q21d x 18 cycles     02/04/2023 -  Chemotherapy   Patient is on Treatment Plan : LUNG NSCLC Pembrolizumab (200) q21d       INTERVAL HISTORY:  Sydney Hunt is here today for repeat clinical for her recurrent metastatic adenocarcinoma of the lung. Patient states that she feels ok but complains of cough and nausea after treatment. She had a CT chest done on 04/24/2023 and results are pending. We reviewed the images in detail and I feel this looks good, but will call her with the final results. She appears to have minimal disease. Her day 1 cycle 5 of Keytruda is scheduled on 04/29/2023. She had a elevated WBC of 12.5 with elevated eosinophils of 25% up from 11.5, hemoglobin of 15.3 up from 14.2, and platelet count of 334,000. Her CMP was normal other than a elevated  total bilirubin of 1.3 up from 1.1.  She informed me on May the 18th she will be in [place] and we will plan her treatment accordingly. I will see her back on 3 weeks with CBC and CMP.   She denies signs of infection such as sore throat, sinus drainage, or urinary symptoms.  She denies fevers or recurrent chills. She denies pain. She denies vomiting, chest pain, dyspnea. Her appetite is good and her weight has decreased 2 pounds over last 4 weeks . This patient is accompanied in the office by her son.     REVIEW OF SYSTEMS:  Review of Systems  Constitutional: Negative.  Negative for appetite change, chills, diaphoresis, fatigue, fever and unexpected weight change.  HENT:   Positive for hearing loss (hearing aid). Negative for lump/mass, mouth sores, nosebleeds, sore throat, tinnitus, trouble swallowing and voice change.   Eyes: Negative.  Negative for eye problems and icterus.  Respiratory:  Positive for cough (occasional clear sputum). Negative for chest tightness, hemoptysis, shortness of breath and wheezing.   Cardiovascular: Negative.  Negative for chest pain, leg swelling and palpitations.  Gastrointestinal:  Positive for nausea. Negative for abdominal distention, abdominal pain, blood in stool, constipation, diarrhea, rectal pain and vomiting.  Endocrine: Negative.  Negative for hot flashes.  Genitourinary: Negative.  Negative for bladder incontinence, difficulty urinating, dyspareunia, dysuria, frequency, hematuria, menstrual problem, nocturia, pelvic pain, vaginal bleeding and vaginal discharge.   Musculoskeletal: Negative.  Negative for arthralgias, back pain, flank pain, gait problem, myalgias, neck pain and neck stiffness.  Skin: Negative.  Negative for itching, rash and wound.  Neurological:  Negative for dizziness, extremity weakness, gait problem, headaches, light-headedness, numbness, seizures and speech difficulty.  Hematological: Negative.  Negative for adenopathy. Does not bruise/bleed easily.  Psychiatric/Behavioral: Negative.  Negative for confusion, decreased concentration, depression, sleep disturbance and suicidal ideas. The patient is not nervous/anxious.    VITALS:  Blood pressure (!) 163/98, pulse 72, temperature 98.2 F (36.8 C), temperature source Oral, resp. rate 18, height 4\' 11"  (1.499 m), weight 96 lb 9.6 oz (43.8 kg).  Wt Readings from Last 3 Encounters:  04/29/23 96 lb 9.6 oz (43.8 kg)  04/07/23 97 lb 1.9 oz (44.1 kg)  04/03/23 98 lb 12.8 oz (44.8 kg)     Body mass index is 19.51 kg/m.  Performance status (ECOG): 1 - Symptomatic but completely ambulatory  PHYSICAL EXAM:  Physical Exam Vitals and nursing note reviewed. Exam conducted with a chaperone present.  Constitutional:      General: She is not in acute distress.    Appearance: Normal appearance. She is normal weight. She is not ill-appearing, toxic-appearing or diaphoretic.  HENT:     Head: Normocephalic and atraumatic.     Right Ear: Tympanic membrane, ear canal and external ear normal. There is no impacted cerumen.     Left Ear: Tympanic membrane, ear canal and external ear normal. There is no impacted cerumen.     Nose: Nose normal. No congestion or rhinorrhea.     Mouth/Throat:     Mouth: Mucous membranes are moist.     Pharynx: Oropharynx is clear. No oropharyngeal exudate or posterior oropharyngeal erythema.  Eyes:     General: No scleral icterus.       Right eye: No discharge.        Left eye: No discharge.     Extraocular Movements: Extraocular movements intact.     Conjunctiva/sclera: Conjunctivae normal.     Pupils: Pupils are equal, round, and  reactive to light.  Neck:     Vascular: No carotid bruit.  Cardiovascular:     Rate and Rhythm: Normal rate and regular rhythm.     Pulses: Normal pulses.     Heart sounds: Normal heart sounds. No murmur heard.    No friction rub. No gallop.  Pulmonary:     Effort: Pulmonary effort is normal. No respiratory distress.     Breath sounds: Normal breath sounds. No stridor. No wheezing, rhonchi or rales.  Chest:     Chest wall: No tenderness.  Abdominal:     General: Bowel sounds are normal. There is no distension.     Palpations: Abdomen is soft. There is no hepatomegaly, splenomegaly or mass.     Tenderness: There is no abdominal tenderness. There is no right CVA tenderness, left CVA tenderness, guarding or rebound.     Hernia: No hernia is present.  Musculoskeletal:        General: No swelling, tenderness, deformity  or signs of injury. Normal range of motion.     Cervical back: Normal range of motion and neck supple. No rigidity or tenderness.     Right lower leg: No edema.     Left lower leg: No edema.  Feet:     Right foot:     Skin integrity: No blister.     Left foot:     Skin integrity: No blister.  Lymphadenopathy:     Cervical: No cervical adenopathy.     Right cervical: No superficial, deep or posterior cervical adenopathy.    Left cervical: No superficial, deep or posterior cervical adenopathy.     Upper Body:     Right upper body: No supraclavicular, axillary or pectoral adenopathy.     Left upper body: No supraclavicular, axillary or pectoral adenopathy.     Lower Body: No right inguinal adenopathy. No left inguinal adenopathy.  Skin:    General: Skin is warm and dry.     Coloration: Skin is not jaundiced or pale.     Findings: No bruising, erythema, lesion or rash.  Neurological:     General: No focal deficit present.     Mental Status: She is alert and oriented to person, place, and time. Mental status is at baseline.     Cranial Nerves: No cranial nerve deficit.     Sensory: No sensory deficit.     Motor: No weakness.     Coordination: Coordination normal.     Gait: Gait normal.     Deep Tendon Reflexes: Reflexes normal.  Psychiatric:        Mood and Affect: Mood normal.        Behavior: Behavior normal.        Thought Content: Thought content normal.        Judgment: Judgment normal.   \ LABS:      Latest Ref Rng & Units 04/24/2023    8:50 AM 04/03/2023    8:36 AM 03/13/2023    9:39 AM  CBC  WBC 4.0 - 10.5 K/uL 12.5  11.5  9.6   Hemoglobin 12.0 - 15.0 g/dL 09.8  11.9  14.7   Hematocrit 36.0 - 46.0 % 43.8  41.0  40.3   Platelets 150 - 400 K/uL 334  314  301       Latest Ref Rng & Units 04/24/2023    8:50 AM 04/03/2023    8:36 AM 03/13/2023    9:39 AM  CMP  Glucose 70 - 99  mg/dL 97  99  253   BUN 8 - 23 mg/dL 16  17  17    Creatinine 0.44 - 1.00 mg/dL 6.64  4.03   4.74   Sodium 135 - 145 mmol/L 139  140  137   Potassium 3.5 - 5.1 mmol/L 4.3  4.5  4.7   Chloride 98 - 111 mmol/L 103  105  103   CO2 22 - 32 mmol/L 26  26  25    Calcium 8.9 - 10.3 mg/dL 25.9  9.7  56.3   Total Protein 6.5 - 8.1 g/dL 7.6  7.7  7.2   Total Bilirubin 0.0 - 1.2 mg/dL 1.3  1.1  1.3   Alkaline Phos 38 - 126 U/L 95  105  116   AST 15 - 41 U/L 16  15  15    ALT 0 - 44 U/L 5  6  6     Lab Results  Component Value Date   CEA1 5.3 (H) 10/17/2022   CEA 4.2 01/13/2023   /  CEA  Date Value Ref Range Status  01/13/2023 4.2  Final  10/17/2022 5.3 (H) 0.0 - 4.7 ng/mL Final    Comment:    (NOTE)                             Nonsmokers          <3.9                             Smokers             <5.6 Roche [employer] Electrochemiluminescence Immunoassay (ECLIA) Values obtained with different assay methods or kits cannot be used interchangeably.  Results cannot be interpreted as absolute evidence of the presence or absence of malignant disease. Performed At: [facility] [address] 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 Report date is 12/12/2020.     The Multi-Cancer + RNA Panel offered by [employer] includes sequencing and/or  deletion/duplication analysis of the following 84 genes:  AIP*, ALK, APC*, ATM*, AXIN2*, BAP1*, BARD1*, BLM*, BMPR1A*, BRCA1*, BRCA2*, BRIP1*, CASR, CDC73*, CDH1*, CDK4, CDKN1B*, CDKN1C*, CDKN2A, CEBPA, CHEK2*, CTNNA1*, DICER1*, DIS3L2*, EGFR, EPCAM, FH   GERD (gastroesophageal reflux disease)    Heartburn 12/27/2021   Hormone disorder 11/20/2020   Hyperbilirubinemia 09/26/2022   Hypertension    Hypokalemia 12/27/2021   Hypothyroidism due to medication 01/22/2022   Leukocytosis 01/17/2022   Migraines    Nodule of left lung 11/21/2020   Non-small cell lung cancer, left (HCC) 01/15/2021   Port-A-Cath in place 09/05/2022   Tobacco use 12/07/2020  Past Surgical History:  Procedure Laterality Date   ABDOMINAL HYSTERECTOMY  1989   unsure of vaginal or abdominal   BRONCHIAL BIOPSY  01/15/2021   Procedure: BRONCHIAL BIOPSIES;  Surgeon: Leslye Peer, MD;  Location: [facility] ENDOSCOPY;  Service: Pulmonary;;   BRONCHIAL BRUSHINGS  01/15/2021   Procedure: BRONCHIAL BRUSHINGS;  Surgeon: Leslye Peer, MD;  Location: [facility] ENDOSCOPY;  Service: Pulmonary;;   BRONCHIAL NEEDLE ASPIRATION BIOPSY  01/15/2021   Procedure: BRONCHIAL NEEDLE ASPIRATION BIOPSIES;  Surgeon: Leslye Peer, MD;  Location: [facility] ENDOSCOPY;  Service: Pulmonary;;   COLONOSCOPY  2022   FIDUCIAL MARKER PLACEMENT  01/15/2021   Procedure: FIDUCIAL MARKER PLACEMENT;  Surgeon: Leslye Peer, MD;  Location: [facility] ENDOSCOPY;  Service: Pulmonary;;   ovaries removed  1986   surgery to remove scar tissue     from colon/bladder in patient's late 30's   TUBAL LIGATION  1986   UPPER GI ENDOSCOPY     years ago in patient's late 28s   VIDEO BRONCHOSCOPY WITH RADIAL ENDOBRONCHIAL ULTRASOUND  01/15/2021   Procedure: VIDEO BRONCHOSCOPY WITH RADIAL ENDOBRONCHIAL ULTRASOUND;  Surgeon: Leslye Peer, MD;  Location: [facility] ENDOSCOPY;  Service: Pulmonary;;    Family History  Problem Relation Age of Onset   Pancreatic cancer Maternal Aunt 50   Brain  cancer Maternal Uncle    Stomach cancer Maternal Uncle        dx. >50   Prostate cancer Half-Brother        passed away at 85   Prostate cancer Half-Brother     Social History:  reports that she quit smoking about 2 years ago. Her smoking use included cigarettes. She started smoking about 62 years ago. She has a 30.1 pack-year smoking history. She has never used smokeless tobacco. She reports current alcohol use of about 4.0 standard drinks of alcohol per week. She reports current drug use. Frequency: 3.00 times per week. Drug: Marijuana.The patient is alone today.  Allergies:  Allergies  Allergen Reactions   Codeine Rash   Demerol [Meperidine Hcl] Other (See Comments)    coma   Meperidine Other (See Comments)    Several days will pass without patient being aware   Other Dermatitis    Stainless steel / skin weeps   Silver Dermatitis    Sterling silver skin weeps   Gold Bond [Menthol-Zinc Oxide] Dermatitis    Skin weeps   Neosporin [Bacitracin-Polymyxin B] Dermatitis    Skin weeps   Wound Dressing Adhesive Other (See Comments)    CAUSES SKIN TEARS PER PATIENT. INSTEAD USE THE TEGADERM 77M WITH ADHESIVE FREE WINDOW OVER PORT.    Current Medications: Current Outpatient Medications  Medication Sig Dispense Refill   aspirin 81 MG EC tablet Take 81 mg by mouth daily.     atorvastatin (LIPITOR) 40 MG tablet Take 40 mg by mouth daily.     celecoxib (CELEBREX) 200 MG capsule Take 200 mg by mouth daily.     HYDROcodone-acetaminophen (NORCO/VICODIN) 5-325 MG tablet Take 1 tablet by mouth every 6 (six) hours as needed for moderate pain (pain score 4-6) or severe pain (pain score 7-10). 30 tablet 0   levothyroxine (SYNTHROID) 75 MCG tablet Take 1 tablet (75 mcg total) by mouth daily. 30 tablet 5   lidocaine-prilocaine (EMLA) cream Apply 1 Application topically as needed. 30 g 0   ondansetron (ZOFRAN) 4 MG tablet Take 1 tablet (4 mg total) by mouth every 4 (four) hours as needed for nausea.  90 tablet 3  potassium chloride SA (KLOR-CON M) 20 MEQ tablet Take 3 tablets (60 mEq total) by mouth 2 (two) times daily. Taking 2 tablets in the morning and 2 tablets in the afternoon (Patient taking differently: Take 60 mEq by mouth 2 (two) times daily. Taking once a day) 180 tablet 3   Prenatal Vit-Fe Fumarate-FA (PRENATAL VITAMIN) 27-0.8 MG TABS Take 1 tablet by mouth daily. 30 tablet 5   prochlorperazine (COMPAZINE) 10 MG tablet TAKE 1 TABLET(10 MG) BY MOUTH EVERY 6 HOURS AS NEEDED FOR NAUSEA OR VOMITING 360 tablet 0   No current facility-administered medications for this visit.    I,Jasmine M Lassiter,acting as a scribe for Dellia Beckwith, MD.,have documented all relevant documentation on the behalf of Dellia Beckwith, MD,as directed by  Dellia Beckwith, MD while in the presence of Dellia Beckwith, MD.

## 2023-04-24 ENCOUNTER — Inpatient Hospital Stay

## 2023-04-24 ENCOUNTER — Ambulatory Visit (INDEPENDENT_AMBULATORY_CARE_PROVIDER_SITE_OTHER)
Admission: RE | Admit: 2023-04-24 | Discharge: 2023-04-24 | Disposition: A | Discharge status: Home / Self Care | Source: Ambulatory Visit | Attending: Oncology | Admitting: Oncology

## 2023-04-24 DIAGNOSIS — C3492 Malignant neoplasm of unspecified part of left bronchus or lung: Secondary | ICD-10-CM

## 2023-04-24 DIAGNOSIS — C7801 Secondary malignant neoplasm of right lung: Secondary | ICD-10-CM | POA: Diagnosis not present

## 2023-04-24 DIAGNOSIS — C7802 Secondary malignant neoplasm of left lung: Secondary | ICD-10-CM

## 2023-04-24 DIAGNOSIS — Z5112 Encounter for antineoplastic immunotherapy: Secondary | ICD-10-CM | POA: Diagnosis not present

## 2023-04-24 LAB — CMP (CANCER CENTER ONLY)
ALT: <5 U/L (ref 0–44)
AST: 16 U/L (ref 15–41)
Albumin: 4.4 g/dL (ref 3.5–5.0)
Alkaline Phosphatase: 95 U/L (ref 38–126)
Anion gap: 10 (ref 5–15)
BUN: 16 mg/dL (ref 8–23)
CO2: 26 mmol/L (ref 22–32)
Calcium: 10 mg/dL (ref 8.9–10.3)
Chloride: 103 mmol/L (ref 98–111)
Creatinine: 0.83 mg/dL (ref 0.44–1.00)
GFR, Estimated: >60 mL/min (ref >60)
Glucose, Bld: 97 mg/dL (ref 70–99)
Potassium: 4.3 mmol/L (ref 3.5–5.1)
Sodium: 139 mmol/L (ref 135–145)
Total Bilirubin: 1.3 mg/dL — ABNORMAL HIGH (ref 0.0–1.2)
Total Protein: 7.6 g/dL (ref 6.5–8.1)

## 2023-04-24 LAB — CBC WITH DIFFERENTIAL (CANCER CENTER ONLY)
Abs Immature Granulocytes: 0.02 10*3/uL (ref 0.00–0.07)
Basophils Absolute: 0.3 10*3/uL — ABNORMAL HIGH (ref 0.0–0.1)
Basophils Relative: 2 %
Eosinophils Absolute: 3.1 10*3/uL — ABNORMAL HIGH (ref 0.0–0.5)
Eosinophils Relative: 25 %
HCT: 43.8 % (ref 36.0–46.0)
Hemoglobin: 15.3 g/dL — ABNORMAL HIGH (ref 12.0–15.0)
Immature Granulocytes: 0 %
Lymphocytes Relative: 23 %
Lymphs Abs: 2.9 10*3/uL (ref 0.7–4.0)
MCH: 33.3 pg (ref 26.0–34.0)
MCHC: 34.9 g/dL (ref 30.0–36.0)
MCV: 95.4 fL (ref 80.0–100.0)
Monocytes Absolute: 0.9 10*3/uL (ref 0.1–1.0)
Monocytes Relative: 7 %
Neutro Abs: 5.3 10*3/uL (ref 1.7–7.7)
Neutrophils Relative %: 43 %
Platelet Count: 334 10*3/uL (ref 150–400)
RBC: 4.59 MIL/uL (ref 3.87–5.11)
RDW: 13 % (ref 11.5–15.5)
WBC Count: 12.5 10*3/uL — ABNORMAL HIGH (ref 4.0–10.5)
nRBC: 0 % (ref 0.0–0.2)
nRBC: 0 /100{WBCs}

## 2023-04-24 MED ORDER — IOHEXOL 300 MG/ML  SOLN
100.0000 mL | Freq: Once | INTRAMUSCULAR | Status: AC | PRN
  Administered 2023-04-24: 75 mL via INTRAVENOUS

## 2023-04-24 MED ORDER — HEPARIN SOD (PORK) LOCK FLUSH 100 UNIT/ML IV SOLN
500.0000 [IU] | Freq: Once | INTRAVENOUS | Status: AC
Start: 2023-04-24 — End: 2023-04-24
  Administered 2023-04-24: 500 [IU] via INTRAVENOUS

## 2023-04-29 ENCOUNTER — Inpatient Hospital Stay: Attending: Hematology and Oncology | Admitting: Oncology

## 2023-04-29 ENCOUNTER — Telehealth: Payer: Self-pay | Admitting: Oncology

## 2023-04-29 ENCOUNTER — Encounter: Payer: Self-pay | Admitting: Oncology

## 2023-04-29 ENCOUNTER — Inpatient Hospital Stay

## 2023-04-29 VITALS — BP 163/98 | HR 72 | Temp 98.2°F | Resp 18 | Ht 59.0 in | Wt 96.6 lb

## 2023-04-29 DIAGNOSIS — Z90722 Acquired absence of ovaries, bilateral: Secondary | ICD-10-CM | POA: Diagnosis not present

## 2023-04-29 DIAGNOSIS — H9190 Unspecified hearing loss, unspecified ear: Secondary | ICD-10-CM | POA: Diagnosis not present

## 2023-04-29 DIAGNOSIS — E876 Hypokalemia: Secondary | ICD-10-CM | POA: Diagnosis not present

## 2023-04-29 DIAGNOSIS — J439 Emphysema, unspecified: Secondary | ICD-10-CM | POA: Diagnosis not present

## 2023-04-29 DIAGNOSIS — Z90710 Acquired absence of both cervix and uterus: Secondary | ICD-10-CM | POA: Insufficient documentation

## 2023-04-29 DIAGNOSIS — Z79620 Long term (current) use of immunosuppressive biologic: Secondary | ICD-10-CM | POA: Diagnosis not present

## 2023-04-29 DIAGNOSIS — Z800 Family history of malignant neoplasm of digestive organs: Secondary | ICD-10-CM | POA: Insufficient documentation

## 2023-04-29 DIAGNOSIS — Z885 Allergy status to narcotic agent status: Secondary | ICD-10-CM | POA: Insufficient documentation

## 2023-04-29 DIAGNOSIS — E032 Hypothyroidism due to medicaments and other exogenous substances: Secondary | ICD-10-CM | POA: Diagnosis not present

## 2023-04-29 DIAGNOSIS — Z87891 Personal history of nicotine dependence: Secondary | ICD-10-CM | POA: Diagnosis not present

## 2023-04-29 DIAGNOSIS — F1290 Cannabis use, unspecified, uncomplicated: Secondary | ICD-10-CM | POA: Diagnosis not present

## 2023-04-29 DIAGNOSIS — Z5112 Encounter for antineoplastic immunotherapy: Secondary | ICD-10-CM | POA: Diagnosis present

## 2023-04-29 DIAGNOSIS — Z8042 Family history of malignant neoplasm of prostate: Secondary | ICD-10-CM | POA: Diagnosis not present

## 2023-04-29 DIAGNOSIS — Z79890 Hormone replacement therapy (postmenopausal): Secondary | ICD-10-CM | POA: Diagnosis not present

## 2023-04-29 DIAGNOSIS — C3492 Malignant neoplasm of unspecified part of left bronchus or lung: Secondary | ICD-10-CM | POA: Diagnosis not present

## 2023-04-29 DIAGNOSIS — I10 Essential (primary) hypertension: Secondary | ICD-10-CM | POA: Insufficient documentation

## 2023-04-29 DIAGNOSIS — I700 Atherosclerosis of aorta: Secondary | ICD-10-CM | POA: Diagnosis not present

## 2023-04-29 DIAGNOSIS — C7801 Secondary malignant neoplasm of right lung: Secondary | ICD-10-CM

## 2023-04-29 DIAGNOSIS — Z79899 Other long term (current) drug therapy: Secondary | ICD-10-CM | POA: Insufficient documentation

## 2023-04-29 DIAGNOSIS — C3432 Malignant neoplasm of lower lobe, left bronchus or lung: Secondary | ICD-10-CM | POA: Diagnosis present

## 2023-04-29 DIAGNOSIS — C7802 Secondary malignant neoplasm of left lung: Secondary | ICD-10-CM

## 2023-04-29 DIAGNOSIS — R110 Nausea: Secondary | ICD-10-CM | POA: Diagnosis not present

## 2023-04-29 MED ORDER — HEPARIN SOD (PORK) LOCK FLUSH 100 UNIT/ML IV SOLN
500.0000 [IU] | Freq: Once | INTRAVENOUS | Status: AC | PRN
Start: 2023-04-29 — End: 2023-04-29
  Administered 2023-04-29: 500 [IU]

## 2023-04-29 MED ORDER — SODIUM CHLORIDE 0.9 % IV SOLN
200.0000 mg | Freq: Once | INTRAVENOUS | Status: AC
  Administered 2023-04-29: 200 mg via INTRAVENOUS
  Filled 2023-04-29: qty 8

## 2023-04-29 MED ORDER — SODIUM CHLORIDE 0.9% FLUSH
10.0000 mL | INTRAVENOUS | Status: DC | PRN
  Administered 2023-04-29: 10 mL

## 2023-04-29 MED ORDER — SODIUM CHLORIDE 0.9 % IV SOLN: INTRAVENOUS | Status: DC

## 2023-04-29 NOTE — Patient Instructions (Signed)
 [facility] [place] - [facility]  Discharge Instructions: Thank you for choosing [facility] to provide your oncology and hematology care.  If you have a lab appointment with the Cancer Center, please go directly to the Cancer Center and check in at the registration area.   Wear comfortable clothing and clothing appropriate for easy access to any Portacath or PICC line.   We strive to give you quality time with your provider. You may need to reschedule your appointment if you arrive late (15 or more minutes).  Arriving late affects you and other patients whose appointments are after yours.  Also, if you miss three or more appointments without notifying the office, you may be dismissed from the clinic at the provider's discretion.      For prescription refill requests, have your pharmacy contact our office and allow 72 hours for refills to be completed.    Today you received the following chemotherapy and/or immunotherapy agents Keytruda      To help prevent nausea and vomiting after your treatment, we encourage you to take your nausea medication as directed.  BELOW ARE SYMPTOMS THAT SHOULD BE REPORTED IMMEDIATELY: *FEVER GREATER THAN 100.4 F (38 C) OR HIGHER *CHILLS OR SWEATING *NAUSEA AND VOMITING THAT IS NOT CONTROLLED WITH YOUR NAUSEA MEDICATION *UNUSUAL SHORTNESS OF BREATH *UNUSUAL BRUISING OR BLEEDING *URINARY PROBLEMS (pain or burning when urinating, or frequent urination) *BOWEL PROBLEMS (unusual diarrhea, constipation, pain near the anus) TENDERNESS IN MOUTH AND THROAT WITH OR WITHOUT PRESENCE OF ULCERS (sore throat, sores in mouth, or a toothache) UNUSUAL RASH, SWELLING OR PAIN  UNUSUAL VAGINAL DISCHARGE OR ITCHING   Items with * indicate a potential emergency and should be followed up as soon as possible or go to the Emergency Department if any problems should occur.  Please show the CHEMOTHERAPY ALERT CARD or IMMUNOTHERAPY ALERT  CARD at check-in to the Emergency Department and triage nurse.  Should you have questions after your visit or need to cancel or reschedule your appointment, please contact [facility] [place] [facility]  Dept: [phone]  and follow the prompts.  Office hours are 8:00 a.m. to 4:30 p.m. Monday - Friday. Please note that voicemails left after 4:00 p.m. may not be returned until the following business day.  We are closed weekends and major holidays. You have access to a nurse at all times for urgent questions. Please call the main number to the clinic Dept: [phone] and follow the prompts.  For any non-urgent questions, you may also contact your provider using MyChart. We now offer e-Visits for anyone 72 and older to request care online for non-urgent symptoms. For details visit [email or web address].   Also download the MyChart app! Go to the app store, search "MyChart", open the app, select [place], and log in with your MyChart username and password.

## 2023-04-29 NOTE — Telephone Encounter (Signed)
 04/29/23 Spoke with patient and confirmed next appt.

## 2023-04-30 ENCOUNTER — Other Ambulatory Visit: Payer: Self-pay

## 2023-05-05 ENCOUNTER — Encounter: Payer: Self-pay | Admitting: Oncology

## 2023-05-14 NOTE — Progress Notes (Signed)
 [facility]  [address] 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  She was treated with left lower lobectomy in [place] .  Her doctor there recommended chemotherapy with immunotherapy, but she refused chemotherapy.  PD-L1 was positive.  She was only willing to take the immunotherapy for 1 year. She started pembrolizumab  in August 2023.  We saw her in October 2023, when she returned to [place]  and continued pembrolizumab  every 21 days. Routine CT imaging had not shown any evidence of recurrence.  She completed 17 cycles of adjuvant pembrolizumab  in August and CT chest following that was negative for recurrence. There were stable scattered small bilateral pulmonary nodules, felt to be from benign emphysema.  Surgical changes were seen in the left lower lobe.    Unfortunately, she had evidence of recurrence on CT chest in December which revealed interval progression in the size and number of multiple bilateral small pulmonary nodules most consistent with progression of metastatic disease. We decided not to pursue a biopsy in view of the difficulty involved. Treatment options were discussed and she wished not to pursue chemotherapy unless absolutely necessary.  She was placed back on pembrolizumab  and had a repeat CT this month. CT chest describes progression of disease as the mediastinal and hilar nodes are mildly larger, the pulmonary nodules are minimally larger, and there is still a subpleural opacity which may be atelectasis or evolving scar tissue. An 3mm tiny hypervascular focus in the anterior hepatic dome that was previously present and an 8 mm subcapsular low-density lesion medial right liver that is stable, but both are too small to characterize. However, when I reviewed  the images and measurements, the differences are minimal.  I called Sydney Hunt and discussed the report and in view of only slight change and the fact that she feels strongly against taking chemotherapy, we will continue the immunotherapy and repeat scans in 3 months.   Malignant neoplasm metastatic to lung [facility]) CT chest in December 2024 revealed multiple bilateral pulmonary nodules which increased in size and number since imaging in August 2024.  These measure up to 7 mm in diameter.  There was an area of pleural plaque in the posteriomedial left upper lobe, measuring 2.9 cm.  Prior PD-L1 was positive at 90%, so she resumed pembrolizumab  in January.  We don't have the radiologist's report on her current CT but I reviewed the images with her and her son, and the disease seems minimal.   Hypothyroidism due to medication The TSH has fluctuated up and down.  She is currently on levothyroxine  75 mcg daily.  Her last TSH was slightly elevated at 5.322.  TSH is pending from today.   Hypokalemia Resolved with potassium chloride  20 mEq daily.  As she developed worsening hypokalemia while on pembrolizumab  in the past, I will have her continue her potassium.   Plan: She has seen her PCP for her cough and shortness of breath. She was examined and placed on an albuterol  inhaler and was switched to atorvastatin 40 mg. Her CT chest describes progression of disease as the mediastinal and hilar nodes are mildly larger, the pulmonary nodules are minimally larger, and there is still a subpleural opacity which may be atelectasis or evolving scar tissue. An 3 mm tiny hypervascular focus in the anterior hepatic dome  that was previously present and an 8 mm subcapsular low-density lesion medial right liver that is stable, but both are too small to characterize. However, when I reviewed the images and measurements, the differences are minimal.  I called Sydney Hunt and discussed the report and in view of only slight change and the fact  that she feels strongly against taking chemotherapy, we will continue the immunotherapy and repeat scans in 3 months. Her day 1 cycle 6 of Keytruda  is scheduled on 05/20/2023. She has a WBC of 10.5, hemoglobin of 14.4, and platelet count of 308,000. Her CMP is normal. Her TSH and T4 today are pending. I will see her back in 3 weeks with CBC, CMP, TSH, and T4. The patient understands the plans discussed today and is in agreement with them.  She knows to contact our office if she develops concerns prior to her next appointment.  I provided 20 minutes of face-to-face time during this encounter and > 50% was spent counseling as documented under my assessment and plan.   [name], MD  [facility] - A DEPT OF [facility] [address] [place] [address] Dept: [phone] Dept Fax: [phone]   No orders of the defined types were placed in this encounter.   CHIEF COMPLAINT:  CC: Recurrent metastatic adenocarcinoma of the lung  Current Treatment: Pembrolizumab  every 3 weeks  HISTORY OF PRESENT ILLNESS:   Oncology History  Non-small cell lung cancer, left (HCC)  01/15/2021 Initial Diagnosis   Non-small cell lung cancer, left (HCC)   02/12/2021 Cancer Staging   Staging form: Lung, AJCC 8th Edition - Clinical stage from 02/12/2021: Stage IA2 (cT1b, cN0, cM0) - Signed by [name], MD on 02/14/2021 Histopathologic type: Adenocarcinoma, NOS Stage prefix: Initial diagnosis Histologic grade (G): GX Histologic grading system: 4 grade system Laterality: Left Tumor size (mm): 19 Lymph-vascular invasion (LVI): LVI not present (absent)/not identified Diagnostic confirmation: Positive histology Specimen type: Bronchial Biopsy Staged by: Managing physician Stage used in treatment planning: Yes National guidelines used in treatment planning: Yes Type of national guideline used in treatment planning: NCCN Staging  comments: Rec surgical resection   05/11/2021 Cancer Staging   Staging form: Lung, AJCC 8th Edition - Pathologic stage from 05/11/2021: Stage IIB (ypT1c, pN1, cM0) - Signed by [name], MD on 11/12/2021 Histopathologic type: Adenocarcinoma, NOS Stage prefix: Post-therapy Histologic grade (G): G2 Histologic grading system: 4 grade system Residual tumor (R): R0 - None Laterality: Left Tumor size (mm): 25 Lymph-vascular invasion (LVI): LVI not present (absent)/not identified Diagnostic confirmation: Positive histology PLUS positive immunophenotyping and/or positive genetic studies Specimen type: Excision Staged by: Managing physician Type of lung cancer: Surgically resected non-small cell lung cancer ECOG performance status: Grade 1 Perineural invasion (PNI): Absent Pleural/elastic layer invasion: PL0 Pleural lavage cytology: Unknown Weight loss: Absent Adequacy of mediastinal dissection: Adequate Radiotherapy dose: No Adjuvant radiation: No Adjuvant chemotherapy: Yes Stage used in treatment planning: Yes National guidelines used in treatment planning: Yes Type of national guideline used in treatment planning: NCCN Staging comments: Adjuvant pembrolizumab , PDL 1 90%   10/04/2021 - 10/01/2022 Chemotherapy   Patient is on Treatment Plan : LUNG NSCLC Pembrolizumab  Adj q21d x 18 cycles     02/04/2023 -  Chemotherapy   Patient is on Treatment Plan : LUNG NSCLC Pembrolizumab  (200) q21d       INTERVAL HISTORY:  Sydney Hunt is here today for repeat clinical for her recurrent metastatic adenocarcinoma of  the lung. Patient states that she feels well but complains of cough and watery eyes due to allergies. She has seen her PCP for her cough and shortness of breath. She was examined and placed on an albuterol  inhaler and was switched to atorvastatin 40 mg. Her CT chest describes progression of disease as the mediastinal and hilar nodes are mildly larger, the pulmonary nodules are minimally  larger, and there is still a subpleural opacity which may be atelectasis or evolving scar tissue. An 3 mm tiny hypervascular focus in the anterior hepatic dome that was previously present and an 8 mm subcapsular low-density lesion medial right liver that is stable, but both are too small to characterize. However, when I reviewed the images and measurements, the differences are minimal.  I called Gemma and discussed the report and in view of only slight change and the fact that she feels strongly against taking chemotherapy, we will continue the immunotherapy and repeat scans in 3 months. Her day 1 cycle 6 of Keytruda  is scheduled on 05/20/2023. She has a WBC of 10.5, hemoglobin of 14.4, and platelet count of 308,000. Her CMP is normal. Her TSH and T4 today are pending. I will see her back in 3 weeks with CBC, CMP, TSH, and T4.   She denies signs of infection such as sore throat, sinus drainage, or urinary symptoms.  She denies fevers or recurrent chills. She denies pain. She denies nausea, vomiting, chest pain. Her appetite is good and her weight has been stable.   REVIEW OF SYSTEMS:  Review of Systems  Constitutional: Negative.  Negative for appetite change, chills, diaphoresis, fatigue, fever and unexpected weight change.  HENT:   Positive for hearing loss (hearing aid). Negative for lump/mass, mouth sores, nosebleeds, sore throat, tinnitus, trouble swallowing and voice change.   Eyes:  Positive for eye problems (watery). Negative for icterus.  Respiratory:  Positive for cough (occasional clear sputum). Negative for chest tightness, hemoptysis, shortness of breath and wheezing.   Cardiovascular: Negative.  Negative for chest pain, leg swelling and palpitations.  Gastrointestinal:  Positive for nausea (after treatment). Negative for abdominal distention, abdominal pain, blood in stool, constipation, diarrhea, rectal pain and vomiting.  Endocrine: Negative.  Negative for hot flashes.  Genitourinary:  Negative.  Negative for bladder incontinence, difficulty urinating, dyspareunia, dysuria, frequency, hematuria, menstrual problem, nocturia, pelvic pain, vaginal bleeding and vaginal discharge.   Musculoskeletal: Negative.  Negative for arthralgias, back pain, flank pain, gait problem, myalgias, neck pain and neck stiffness.  Skin: Negative.  Negative for itching, rash and wound.  Neurological:  Negative for dizziness, extremity weakness, gait problem, headaches, light-headedness, numbness, seizures and speech difficulty.  Hematological: Negative.  Negative for adenopathy. Does not bruise/bleed easily.  Psychiatric/Behavioral: Negative.  Negative for confusion, decreased concentration, depression, sleep disturbance and suicidal ideas. The patient is not nervous/anxious.    VITALS:  Blood pressure 124/76, pulse 77, temperature 98.1 F (36.7 C), temperature source Oral, resp. rate 18, height 4\' 11"  (1.499 m), weight 96 lb 4.8 oz (43.7 kg), SpO2 97%.  Wt Readings from Last 3 Encounters:  05/20/23 95 lb 1.9 oz (43.1 kg)  05/15/23 96 lb 4.8 oz (43.7 kg)  04/29/23 96 lb 9.6 oz (43.8 kg)    Body mass index is 19.45 kg/m.  Performance status (ECOG): 1 - Symptomatic but completely ambulatory  PHYSICAL EXAM:  Physical Exam Vitals and nursing note reviewed. Exam conducted with a chaperone present.  Constitutional:      General: She is not in  acute distress.    Appearance: Normal appearance. She is normal weight. She is not ill-appearing, toxic-appearing or diaphoretic.  HENT:     Head: Normocephalic and atraumatic.     Right Ear: Tympanic membrane, ear canal and external ear normal. There is no impacted cerumen.     Left Ear: Tympanic membrane, ear canal and external ear normal. There is no impacted cerumen.     Nose: Nose normal. No congestion or rhinorrhea.     Mouth/Throat:     Mouth: Mucous membranes are moist.     Pharynx: Oropharynx is clear. No oropharyngeal exudate or posterior  oropharyngeal erythema.  Eyes:     General: No scleral icterus.       Right eye: No discharge.        Left eye: No discharge.     Extraocular Movements: Extraocular movements intact.     Conjunctiva/sclera: Conjunctivae normal.     Pupils: Pupils are equal, round, and reactive to light.  Neck:     Vascular: No carotid bruit.  Cardiovascular:     Rate and Rhythm: Normal rate and regular rhythm.     Pulses: Normal pulses.     Heart sounds: Normal heart sounds. No murmur heard.    No friction rub. No gallop.  Pulmonary:     Effort: Pulmonary effort is normal. No respiratory distress.     Breath sounds: No stridor. Examination of the right-lower field reveals rales. Examination of the left-lower field reveals rales. Rales present. No wheezing or rhonchi.     Comments: Faint rales at both bases Chest:     Chest wall: No tenderness.  Abdominal:     General: Bowel sounds are normal. There is no distension.     Palpations: Abdomen is soft. There is no hepatomegaly, splenomegaly or mass.     Tenderness: There is no abdominal tenderness. There is no right CVA tenderness, left CVA tenderness, guarding or rebound.     Hernia: No hernia is present.  Musculoskeletal:        General: No swelling, tenderness, deformity or signs of injury. Normal range of motion.     Cervical back: Normal range of motion and neck supple. No rigidity or tenderness.     Right lower leg: No edema.     Left lower leg: No edema.  Feet:     Right foot:     Skin integrity: No blister.     Left foot:     Skin integrity: No blister.  Lymphadenopathy:     Cervical: No cervical adenopathy.     Right cervical: No superficial, deep or posterior cervical adenopathy.    Left cervical: No superficial, deep or posterior cervical adenopathy.     Upper Body:     Right upper body: No supraclavicular, axillary or pectoral adenopathy.     Left upper body: No supraclavicular, axillary or pectoral adenopathy.     Lower Body: No  right inguinal adenopathy. No left inguinal adenopathy.  Skin:    General: Skin is warm and dry.     Coloration: Skin is not jaundiced or pale.     Findings: No bruising, erythema, lesion or rash.  Neurological:     General: No focal deficit present.     Mental Status: She is alert and oriented to person, place, and time. Mental status is at baseline.     Cranial Nerves: No cranial nerve deficit.     Sensory: No sensory deficit.     Motor: No weakness.  Coordination: Coordination normal.     Gait: Gait normal.     Deep Tendon Reflexes: Reflexes normal.  Psychiatric:        Mood and Affect: Mood normal.        Behavior: Behavior normal.        Thought Content: Thought content normal.        Judgment: Judgment normal.   \ LABS:      Latest Ref Rng & Units 05/15/2023   10:58 AM 04/24/2023    8:50 AM 04/03/2023    8:36 AM  CBC  WBC 4.0 - 10.5 K/uL 10.5  12.5  11.5   Hemoglobin 12.0 - 15.0 g/dL 16.1  09.6  04.5   Hematocrit 36.0 - 46.0 % 42.1  43.8  41.0   Platelets 150 - 400 K/uL 308  334  314       Latest Ref Rng & Units 05/15/2023   10:58 AM 04/24/2023    8:50 AM 04/03/2023    8:36 AM  CMP  Glucose 70 - 99 mg/dL 409  97  99   BUN 8 - 23 mg/dL 15  16  17    Creatinine 0.44 - 1.00 mg/dL 8.11  9.14  7.82   Sodium 135 - 145 mmol/L 138  139  140   Potassium 3.5 - 5.1 mmol/L 4.5  4.3  4.5   Chloride 98 - 111 mmol/L 103  103  105   CO2 22 - 32 mmol/L 27  26  26    Calcium 8.9 - 10.3 mg/dL 9.7  95.6  9.7   Total Protein 6.5 - 8.1 g/dL 7.1  7.6  7.7   Total Bilirubin 0.0 - 1.2 mg/dL 1.0  1.3  1.1   Alkaline Phos 38 - 126 U/L 69  95  105   AST 15 - 41 U/L 14  16  15    ALT 0 - 44 U/L <5  <5  6    Lab Results  Component Value Date   CEA1 5.3 (H) 10/17/2022   CEA 4.2 01/13/2023   /  CEA  Date Value Ref Range Status  01/13/2023 4.2  Final  10/17/2022 5.3 (H) 0.0 - 4.7 ng/mL Final    Comment:    (NOTE)                             Nonsmokers          <3.9                              Smokers             <5.6 [employer] Electrochemiluminescence Immunoassay (ECLIA) Values obtained with different assay methods or kits cannot be used interchangeably.  Results cannot be interpreted as absolute evidence of the presence or absence of malignant disease. Performed At: [facility] [address] [place], [place] [id number] Pearlean Botts MD IO:[id number]    Lab Results  Component Value Date   TSH 5.122 (H) 05/15/2023   T4TOTAL 9.9 05/15/2023   Lab Results  Component Value Date   LDH 130 11/28/2022   STUDIES:  EXAM: 04/24/2023 CT CHEST WITH CONTRAST IMPRESSION: 1. Interval progression of mediastinal and hilar lymphadenopathy, highly suspicious for metastatic disease. 2. Bilateral pulmonary nodules have increased in size in the interval, also worrisome for metastatic progression. 3. Subpleural opacity in the posterior left lung is progressive in  the interval and may be atelectatic or evolving scar. Attention on follow-up recommended. 4. Tiny hypervascular focus in the anterior hepatic dome is too small to characterize but present previously. While likely benign, attention on follow-up recommended. 5.  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 Report date is 12/12/2020.     The Multi-Cancer + RNA Panel offered by [facility] includes sequencing and/or deletion/duplication analysis of the  following 84 genes:  AIP*, ALK, APC*, ATM*, AXIN2*, BAP1*, BARD1*, BLM*, BMPR1A*, BRCA1*, BRCA2*, BRIP1*, CASR, CDC73*, CDH1*, CDK4, CDKN1B*, CDKN1C*, CDKN2A, CEBPA, CHEK2*, CTNNA1*, DICER1*, DIS3L2*, EGFR, EPCAM, FH   GERD (gastroesophageal reflux disease)    Heartburn 12/27/2021   Hormone disorder 11/20/2020   Hyperbilirubinemia 09/26/2022   Hypertension    Hypokalemia 12/27/2021   Hypothyroidism due to medication 01/22/2022   Leukocytosis 01/17/2022   Migraines    Nodule of left lung 11/21/2020   Non-small cell lung cancer, left (HCC) 01/15/2021   Port-A-Cath in place 09/05/2022   Tobacco use 12/07/2020    Past Surgical History:  Procedure Laterality Date   ABDOMINAL HYSTERECTOMY  1989   unsure of vaginal or abdominal   BRONCHIAL BIOPSY  01/15/2021   Procedure: BRONCHIAL BIOPSIES;  Surgeon: Denson Flake, MD;  Location: [facility] ENDOSCOPY;  Service: Pulmonary;;   BRONCHIAL BRUSHINGS  01/15/2021   Procedure: BRONCHIAL BRUSHINGS;  Surgeon: Denson Flake, MD;  [facility] ENDOSCOPY;  Service: Pulmonary;;   BRONCHIAL NEEDLE ASPIRATION BIOPSY  01/15/2021   Procedure: BRONCHIAL NEEDLE ASPIRATION BIOPSIES;  Surgeon: Denson Flake, MD;  Location: [facility] ENDOSCOPY;  Service: Pulmonary;;   COLONOSCOPY  2022   FIDUCIAL MARKER PLACEMENT  01/15/2021   Procedure: FIDUCIAL MARKER PLACEMENT;  Surgeon: Denson Flake, MD;  Location: [facility] ENDOSCOPY;  Service: Pulmonary;;   ovaries removed  1986   surgery to remove scar tissue     from colon/bladder in patient's late 30's   TUBAL LIGATION  1986   UPPER GI ENDOSCOPY     years ago in patient's late 66s   VIDEO BRONCHOSCOPY WITH RADIAL ENDOBRONCHIAL ULTRASOUND  01/15/2021   Procedure: VIDEO BRONCHOSCOPY WITH RADIAL ENDOBRONCHIAL ULTRASOUND;  Surgeon: Denson Flake, MD;  Location: [facility] ENDOSCOPY;  Service: Pulmonary;;    Family History  Problem Relation Age of Onset   Pancreatic cancer Maternal Aunt 50   Brain cancer Maternal Uncle    Stomach  cancer Maternal Uncle        dx. >50   Prostate cancer Half-Brother        passed away at 30   Prostate cancer Half-Brother     Social History:  reports that she quit smoking about 2 years ago. Her smoking use included cigarettes. She started smoking about 62 years ago. She has a 30.1 pack-year smoking history. She has never used smokeless tobacco. She reports current alcohol use of about 4.0 standard drinks of alcohol per week. She reports current drug use. Frequency: 3.00  times per week. Drug: Marijuana.The patient is alone today.  Allergies:  Allergies  Allergen Reactions   Codeine Rash   Demerol [Meperidine Hcl] Other (See Comments)    coma   Meperidine Other (See Comments)    Several days will pass without patient being aware  Several days will pass without patient being aware    coma   Other Dermatitis    Stainless steel / skin weeps   Silver Dermatitis    Sterling silver skin weeps   Gold Bond [Menthol-Zinc Oxide] Dermatitis    Skin weeps   Neosporin [Bacitracin-Polymyxin B] Dermatitis    Skin weeps   Wound Dressing Adhesive Other (See Comments)    CAUSES SKIN TEARS PER PATIENT. INSTEAD USE THE TEGADERM 60M WITH ADHESIVE FREE WINDOW OVER PORT.   Wound Dressings Other (See Comments)    CAUSES SKIN TEARS PER PATIENT. INSTEAD USE THE TEGADERM 60M WITH ADHESIVE FREE WINDOW OVER PORT.    Current Medications: Current Outpatient Medications  Medication Sig Dispense Refill   pantoprazole  (PROTONIX ) 40 MG tablet      VENTOLIN  HFA 108 (90 Base) MCG/ACT inhaler Inhale into the lungs.     aspirin 81 MG EC tablet Take 81 mg by mouth daily.     atorvastatin (LIPITOR) 40 MG tablet Take 40 mg by mouth daily.     celecoxib (CELEBREX) 200 MG capsule Take 200 mg by mouth daily.     HYDROcodone -acetaminophen  (NORCO/VICODIN) 5-325 MG tablet Take 1 tablet by mouth every 6 (six) hours as needed for moderate pain (pain score 4-6) or severe pain (pain score 7-10). 30 tablet 0   levothyroxine   (SYNTHROID ) 75 MCG tablet Take 1 tablet (75 mcg total) by mouth daily. 30 tablet 5   lidocaine -prilocaine  (EMLA ) cream Apply 1 Application topically as needed. 30 g 0   ondansetron  (ZOFRAN ) 4 MG tablet Take 1 tablet (4 mg total) by mouth every 4 (four) hours as needed for nausea. 90 tablet 3   potassium chloride  SA (KLOR-CON  M) 20 MEQ tablet Take 3 tablets (60 mEq total) by mouth 2 (two) times daily. Taking 2 tablets in the morning and 2 tablets in the afternoon (Patient taking differently: Take 20 mEq by mouth once.) 180 tablet 3   Prenatal Vit-Fe Fumarate-FA (PRENATAL VITAMIN) 27-0.8 MG TABS Take 1 tablet by mouth daily. 30 tablet 5   prochlorperazine  (COMPAZINE ) 10 MG tablet TAKE 1 TABLET(10 MG) BY MOUTH EVERY 6 HOURS AS NEEDED FOR NAUSEA OR VOMITING 360 tablet 0   No current facility-administered medications for this visit.    I,Jasmine M Lassiter,acting as a scribe for [name], MD.,have documented all relevant documentation on the behalf of [name], MD,as directed by  [name], MD while in the presence of [name], MD.

## 2023-05-15 ENCOUNTER — Inpatient Hospital Stay (HOSPITAL_BASED_OUTPATIENT_CLINIC_OR_DEPARTMENT_OTHER): Admitting: Oncology

## 2023-05-15 ENCOUNTER — Encounter: Payer: Self-pay | Admitting: Oncology

## 2023-05-15 ENCOUNTER — Telehealth: Payer: Self-pay

## 2023-05-15 ENCOUNTER — Inpatient Hospital Stay

## 2023-05-15 VITALS — BP 124/76 | HR 77 | Temp 98.1°F | Resp 18 | Ht 59.0 in | Wt 96.3 lb

## 2023-05-15 DIAGNOSIS — Z95828 Presence of other vascular implants and grafts: Secondary | ICD-10-CM

## 2023-05-15 DIAGNOSIS — C7801 Secondary malignant neoplasm of right lung: Secondary | ICD-10-CM | POA: Diagnosis not present

## 2023-05-15 DIAGNOSIS — C7802 Secondary malignant neoplasm of left lung: Secondary | ICD-10-CM | POA: Diagnosis not present

## 2023-05-15 DIAGNOSIS — C3492 Malignant neoplasm of unspecified part of left bronchus or lung: Secondary | ICD-10-CM | POA: Diagnosis not present

## 2023-05-15 DIAGNOSIS — Z5112 Encounter for antineoplastic immunotherapy: Secondary | ICD-10-CM | POA: Diagnosis not present

## 2023-05-15 LAB — TSH: TSH: 5.122 u[IU]/mL — ABNORMAL HIGH (ref 0.350–4.500)

## 2023-05-15 LAB — CMP (CANCER CENTER ONLY)
ALT: <5 U/L (ref 0–44)
AST: 14 U/L — ABNORMAL LOW (ref 15–41)
Albumin: 3.9 g/dL (ref 3.5–5.0)
Alkaline Phosphatase: 69 U/L (ref 38–126)
Anion gap: 8 (ref 5–15)
BUN: 15 mg/dL (ref 8–23)
CO2: 27 mmol/L (ref 22–32)
Calcium: 9.7 mg/dL (ref 8.9–10.3)
Chloride: 103 mmol/L (ref 98–111)
Creatinine: 0.83 mg/dL (ref 0.44–1.00)
GFR, Estimated: >60 mL/min (ref >60)
Glucose, Bld: 104 mg/dL — ABNORMAL HIGH (ref 70–99)
Potassium: 4.5 mmol/L (ref 3.5–5.1)
Sodium: 138 mmol/L (ref 135–145)
Total Bilirubin: 1 mg/dL (ref 0.0–1.2)
Total Protein: 7.1 g/dL (ref 6.5–8.1)

## 2023-05-15 LAB — CBC WITH DIFFERENTIAL (CANCER CENTER ONLY)
Abs Immature Granulocytes: 0 10*3/uL (ref 0.00–0.07)
Basophils Absolute: 0.3 10*3/uL — ABNORMAL HIGH (ref 0.0–0.1)
Basophils Relative: 3 %
Eosinophils Absolute: 2.6 10*3/uL — ABNORMAL HIGH (ref 0.0–0.5)
Eosinophils Relative: 25 %
HCT: 42.1 % (ref 36.0–46.0)
Hemoglobin: 14.4 g/dL (ref 12.0–15.0)
Immature Granulocytes: 0 %
Lymphocytes Relative: 31 %
Lymphs Abs: 3.3 10*3/uL (ref 0.7–4.0)
MCH: 32.9 pg (ref 26.0–34.0)
MCHC: 34.2 g/dL (ref 30.0–36.0)
MCV: 96.1 fL (ref 80.0–100.0)
Monocytes Absolute: 0.8 10*3/uL (ref 0.1–1.0)
Monocytes Relative: 8 %
Neutro Abs: 3.5 10*3/uL (ref 1.7–7.7)
Neutrophils Relative %: 33 %
Platelet Count: 308 10*3/uL (ref 150–400)
RBC: 4.38 MIL/uL (ref 3.87–5.11)
RDW: 12.8 % (ref 11.5–15.5)
WBC Count: 10.5 10*3/uL (ref 4.0–10.5)
nRBC: 0 % (ref 0.0–0.2)
nRBC: 0 /100{WBCs}

## 2023-05-15 MED ORDER — HEPARIN SOD (PORK) LOCK FLUSH 100 UNIT/ML IV SOLN
500.0000 [IU] | Freq: Once | INTRAVENOUS | Status: AC | PRN
  Administered 2023-05-15: 500 [IU]

## 2023-05-15 MED ORDER — SODIUM CHLORIDE 0.9% FLUSH
10.0000 mL | INTRAVENOUS | Status: DC | PRN
  Administered 2023-05-15: 10 mL

## 2023-05-15 NOTE — Telephone Encounter (Signed)
-----   Message from [name] sent at 05/15/2023 11:44 AM EDT ----- Regarding: med She is taking 1 potassium daily

## 2023-05-15 NOTE — Telephone Encounter (Signed)
 Medication list updated.

## 2023-05-16 ENCOUNTER — Other Ambulatory Visit: Payer: Self-pay

## 2023-05-16 LAB — T4: T4, Total: 9.9 ug/dL (ref 4.5–12.0)

## 2023-05-20 ENCOUNTER — Ambulatory Visit: Admitting: Hematology and Oncology

## 2023-05-20 ENCOUNTER — Inpatient Hospital Stay

## 2023-05-20 ENCOUNTER — Other Ambulatory Visit

## 2023-05-20 VITALS — BP 151/90 | HR 82 | Temp 98.0°F | Resp 14 | Ht 59.0 in | Wt 95.1 lb

## 2023-05-20 DIAGNOSIS — C3492 Malignant neoplasm of unspecified part of left bronchus or lung: Secondary | ICD-10-CM

## 2023-05-20 DIAGNOSIS — Z5112 Encounter for antineoplastic immunotherapy: Secondary | ICD-10-CM | POA: Diagnosis not present

## 2023-05-20 MED ORDER — SODIUM CHLORIDE 0.9% FLUSH: 10.0000 mL | INTRAVENOUS | Status: DC | PRN

## 2023-05-20 MED ORDER — HEPARIN SOD (PORK) LOCK FLUSH 100 UNIT/ML IV SOLN: 500.0000 [IU] | Freq: Once | INTRAVENOUS | Status: DC | PRN

## 2023-05-20 MED ORDER — SODIUM CHLORIDE 0.9 % IV SOLN
200.0000 mg | Freq: Once | INTRAVENOUS | Status: AC
  Administered 2023-05-20: 200 mg via INTRAVENOUS
  Filled 2023-05-20: qty 8

## 2023-05-20 MED ORDER — SODIUM CHLORIDE 0.9 % IV SOLN: INTRAVENOUS | Status: DC

## 2023-05-20 NOTE — Patient Instructions (Signed)

## 2023-05-28 ENCOUNTER — Encounter: Payer: Self-pay | Admitting: Oncology

## 2023-06-03 ENCOUNTER — Other Ambulatory Visit: Payer: Self-pay | Admitting: Hematology and Oncology

## 2023-06-03 DIAGNOSIS — K219 Gastro-esophageal reflux disease without esophagitis: Secondary | ICD-10-CM

## 2023-06-04 ENCOUNTER — Encounter: Payer: Self-pay | Admitting: Oncology

## 2023-06-04 ENCOUNTER — Other Ambulatory Visit: Payer: Self-pay | Admitting: Oncology

## 2023-06-04 DIAGNOSIS — C3492 Malignant neoplasm of unspecified part of left bronchus or lung: Secondary | ICD-10-CM

## 2023-06-04 NOTE — Progress Notes (Signed)
 [facility]  [address] [place],  [place]  [address] [phone]  Clinic Day: 06/05/23   Referring physician: Alveta Joiner, FNP  ASSESSMENT & PLAN:  Assessment: Non-small cell lung cancer, left (HCC) Stage IIB adenocarcinoma of the left lung diagnosed in December 2022.  Sydney Hunt was treated with left lower lobectomy in [place] .  Her doctor there recommended chemotherapy with immunotherapy, but Sydney Hunt refused chemotherapy.  PD-L1 was positive.  Sydney Hunt was only willing to take the immunotherapy for 1 year. Sydney Hunt started pembrolizumab  in August 2023.  We saw her in October 2023, when Sydney Hunt returned to [place]  and continued pembrolizumab  every 21 days. Routine CT imaging had not shown any evidence of recurrence.  Sydney Hunt completed 17 cycles of adjuvant pembrolizumab  in August and CT chest following that was negative for recurrence. There were stable scattered small bilateral pulmonary nodules, felt to be from benign emphysema.  Surgical changes were seen in the left lower lobe.    Unfortunately, Sydney Hunt had evidence of recurrence on CT chest in December of 2024, which revealed interval progression in the size and number of multiple bilateral small pulmonary nodules most consistent with progression of metastatic disease. We decided not to pursue a biopsy in view of the difficulty involved. Treatment options were discussed and Sydney Hunt wished not to pursue chemotherapy unless absolutely necessary.  Sydney Hunt was placed back on pembrolizumab  and had a repeat CT on 04/24/2023. CT chest describes progression of disease as the mediastinal and hilar nodes are mildly larger, the pulmonary nodules are minimally larger, and there is still a subpleural opacity which may be atelectasis or evolving scar tissue. An 3 mm tiny hypervascular focus in the anterior hepatic dome that was previously present and an 8 mm subcapsular low-density lesion medial right liver that is stable, but both are too small to characterize. However,  when I reviewed the images and measurements, the differences are minimal.  I called Samar to discuss the report and in view of only slight change and the fact that Sydney Hunt feels strongly against taking chemotherapy, we will continue the immunotherapy and repeat scans in 3 months.   Malignant neoplasm metastatic to lung [facility]) CT chest in December 2024 revealed multiple bilateral pulmonary nodules which increased in size and number since imaging in August 2024.  These measure up to 7 mm in diameter.  There was an area of pleural plaque in the posteriomedial left upper lobe, measuring 2.9 cm.  Prior PD-L1 was positive at 90%, so Sydney Hunt resumed pembrolizumab  in January.  We don't have the radiologist's report on her current CT but I reviewed the images with her and her son, and the change in disease seems minimal.   Hypothyroidism due to medication The TSH has fluctuated up and down.  Sydney Hunt is currently on levothyroxine  75 mcg daily.  Her last TSH was slightly elevated at 5.322.  TSH is pending from today.   Hypokalemia Resolved with potassium chloride  20 mEq daily.  Since her potassium is up to 4.8 now Sydney Hunt can stop the supplement.   Mild Hypoglycemia Sydney Hunt is asymptomatic but I discussed what to look for and how to avoid and treat hypoglycemia.    Plan: Sydney Hunt informed me that Sydney Hunt may have missed one of her thyroid  tablets for one day. CT chest done on 04/24/2023 describes progression of disease as the mediastinal and hilar nodes are mildly larger, the pulmonary nodules are minimally larger, and there is still a subpleural opacity which may be atelectasis or evolving  scar tissue. An 3 mm tiny hypervascular focus in the anterior hepatic dome that was previously present and an 8 mm subcapsular low-density lesion medial right liver that is stable, but both are too small to characterize. However, when I reviewed the images and measurements, the differences are minimal. Her day 1 cycle 7 of Keytruda  is scheduled on  06/10/2023. Sydney Hunt has a WBC of 9.1, hemoglobin of 14.4, and platelet count of 291,000. Her CMP is normal. Sydney Hunt has a potassium of 4.8 and Sydney Hunt takes 1 oral potassium supplement daily. I instructed that Sydney Hunt can stop that now. Her glucose was low at 66 so we discussed the symptoms of hypoglycemia and measures to take to avoid this. Sydney Hunt will get something to eat after this appointment. I will see her back at the end of May with CBC, CMP, TSH, and T4. The patient understands the plans discussed today and is in agreement with them.  Sydney Hunt knows to contact our office if Sydney Hunt develops concerns prior to her next appointment.  I provided 10 minutes of face-to-face time during this encounter and > 50% was spent counseling as documented under my assessment and plan.   [name], MD  [facility] - A DEPT OF [facility] [address] [place] [address] Dept: [phone] Dept Fax: [phone]   No orders of the defined types were placed in this encounter.   CHIEF COMPLAINT:  CC: Recurrent metastatic adenocarcinoma of the lung  Current Treatment: Pembrolizumab  every 3 weeks  HISTORY OF PRESENT ILLNESS:   Oncology History  Non-small cell lung cancer, left (HCC)  01/15/2021 Initial Diagnosis   Non-small cell lung cancer, left (HCC)   02/12/2021 Cancer Staging   Staging form: Lung, AJCC 8th Edition - Clinical stage from 02/12/2021: Stage IA2 (cT1b, cN0, cM0) - Signed by [name], MD on 02/14/2021 Histopathologic type: Adenocarcinoma, NOS Stage prefix: Initial diagnosis Histologic grade (G): GX Histologic grading system: 4 grade system Laterality: Left Tumor size (mm): 19 Lymph-vascular invasion (LVI): LVI not present (absent)/not identified Diagnostic confirmation: Positive histology Specimen type: Bronchial Biopsy Staged by: Managing physician Stage used in treatment planning: Yes National guidelines used in treatment  planning: Yes Type of national guideline used in treatment planning: NCCN Staging comments: Rec surgical resection   05/11/2021 Cancer Staging   Staging form: Lung, AJCC 8th Edition - Pathologic stage from 05/11/2021: Stage IIB (ypT1c, pN1, cM0) - Signed by [name], MD on 11/12/2021 Histopathologic type: Adenocarcinoma, NOS Stage prefix: Post-therapy Histologic grade (G): G2 Histologic grading system: 4 grade system Residual tumor (R): R0 - None Laterality: Left Tumor size (mm): 25 Lymph-vascular invasion (LVI): LVI not present (absent)/not identified Diagnostic confirmation: Positive histology PLUS positive immunophenotyping and/or positive genetic studies Specimen type: Excision Staged by: Managing physician Type of lung cancer: Surgically resected non-small cell lung cancer ECOG performance status: Grade 1 Perineural invasion (PNI): Absent Pleural/elastic layer invasion: PL0 Pleural lavage cytology: Unknown Weight loss: Absent Adequacy of mediastinal dissection: Adequate Radiotherapy dose: No Adjuvant radiation: No Adjuvant chemotherapy: Yes Stage used in treatment planning: Yes National guidelines used in treatment planning: Yes Type of national guideline used in treatment planning: NCCN Staging comments: Adjuvant pembrolizumab , PDL 1 90%   10/04/2021 - 10/01/2022 Chemotherapy   Patient is on Treatment Plan : LUNG NSCLC Pembrolizumab  Adj q21d x 18 cycles     02/04/2023 -  Chemotherapy   Patient is on Treatment Plan : LUNG NSCLC Pembrolizumab  (200)  q21d       INTERVAL HISTORY:  Sydney Hunt is here today for repeat clinical for her recurrent metastatic adenocarcinoma of the lung. Patient states that Sydney Hunt feels well and has no complaints of pain. Sydney Hunt informed me that Sydney Hunt may have missed one of her thyroid  tablets for one day. CT chest done on 04/24/2023 describes progression of disease as the mediastinal and hilar nodes are mildly larger, the pulmonary nodules are minimally  larger, and there is still a subpleural opacity which may be atelectasis or evolving scar tissue. An 3 mm tiny hypervascular focus in the anterior hepatic dome that was previously present and an 8 mm subcapsular low-density lesion medial right liver that is stable, but both are too small to characterize. However, when I reviewed the images and measurements, the differences are minimal. Her day 1 cycle 7 of Keytruda  is scheduled on 06/10/2023. Sydney Hunt has a WBC of 9.1, hemoglobin of 14.4, and platelet count of 291,000. Her CMP is normal. Sydney Hunt has a potassium of 4.8 and Sydney Hunt takes 1 oral potassium supplement daily. I instructed that Sydney Hunt can stop that now. Her glucose was low at 66 so we discussed the symptoms of hypoglycemia and measures to take to avoid this. Sydney Hunt will get something to eat after this appointment. I will see her back at the end of May with CBC, CMP, TSH, and T4.   Sydney Hunt denies fever, chills, night sweats, or other signs of infection. Sydney Hunt denies cardiorespiratory and gastrointestinal issues. Sydney Hunt  denies pain. Her appetite is good and her weight has increased 2 pounds over last 3 weeks.    REVIEW OF SYSTEMS:  Review of Systems  Constitutional: Negative.  Negative for appetite change, chills, diaphoresis, fatigue, fever and unexpected weight change.  HENT:   Positive for hearing loss (hearing aid). Negative for lump/mass, mouth sores, nosebleeds, sore throat, tinnitus, trouble swallowing and voice change.   Eyes:  Positive for eye problems (watery). Negative for icterus.  Respiratory:  Positive for cough (occasional clear sputum). Negative for chest tightness, hemoptysis, shortness of breath and wheezing.   Cardiovascular: Negative.  Negative for chest pain, leg swelling and palpitations.  Gastrointestinal:  Positive for nausea (after treatment). Negative for abdominal distention, abdominal pain, blood in stool, constipation, diarrhea, rectal pain and vomiting.  Endocrine: Negative.  Negative for hot  flashes.  Genitourinary: Negative.  Negative for bladder incontinence, difficulty urinating, dyspareunia, dysuria, frequency, hematuria, menstrual problem, nocturia, pelvic pain, vaginal bleeding and vaginal discharge.   Musculoskeletal: Negative.  Negative for arthralgias, back pain, flank pain, gait problem, myalgias, neck pain and neck stiffness.  Skin: Negative.  Negative for itching, rash and wound.  Neurological:  Negative for dizziness, extremity weakness, gait problem, headaches, light-headedness, numbness, seizures and speech difficulty.  Hematological: Negative.  Negative for adenopathy. Does not bruise/bleed easily.  Psychiatric/Behavioral: Negative.  Negative for confusion, decreased concentration, depression, sleep disturbance and suicidal ideas. The patient is not nervous/anxious.    VITALS:  Blood pressure 127/75, pulse (!) 59, temperature 97.9 F (36.6 C), temperature source Oral, resp. rate 18, height 4\' 11"  (1.499 m), weight 98 lb 11.2 oz (44.8 kg), SpO2 100%.  Wt Readings from Last 3 Encounters:  06/05/23 98 lb 11.2 oz (44.8 kg)  05/20/23 95 lb 1.9 oz (43.1 kg)  05/15/23 96 lb 4.8 oz (43.7 kg)    Body mass index is 19.93 kg/m.  Performance status (ECOG): 1 - Symptomatic but completely ambulatory  PHYSICAL EXAM:  Physical Exam Vitals and nursing note reviewed. Exam  conducted with a chaperone present.  Constitutional:      General: Sydney Hunt is not in acute distress.    Appearance: Normal appearance. Sydney Hunt is normal weight. Sydney Hunt is not ill-appearing, toxic-appearing or diaphoretic.  HENT:     Head: Normocephalic and atraumatic.     Right Ear: Tympanic membrane, ear canal and external ear normal. There is no impacted cerumen.     Left Ear: Tympanic membrane, ear canal and external ear normal. There is no impacted cerumen.     Nose: Nose normal. No congestion or rhinorrhea.     Mouth/Throat:     Mouth: Mucous membranes are moist.     Pharynx: Oropharynx is clear. No  oropharyngeal exudate or posterior oropharyngeal erythema.  Eyes:     General: No scleral icterus.       Right eye: No discharge.        Left eye: No discharge.     Extraocular Movements: Extraocular movements intact.     Conjunctiva/sclera: Conjunctivae normal.     Pupils: Pupils are equal, round, and reactive to light.  Neck:     Vascular: No carotid bruit.  Cardiovascular:     Rate and Rhythm: Regular rhythm. Bradycardia present.     Pulses: Normal pulses.     Heart sounds: Normal heart sounds. No murmur heard.    No friction rub. No gallop.  Pulmonary:     Effort: Pulmonary effort is normal. No respiratory distress.     Breath sounds: Normal breath sounds. No stridor. No wheezing, rhonchi or rales.  Chest:     Chest wall: No tenderness.  Abdominal:     General: Bowel sounds are normal. There is no distension.     Palpations: Abdomen is soft. There is no hepatomegaly, splenomegaly or mass.     Tenderness: There is no abdominal tenderness. There is no right CVA tenderness, left CVA tenderness, guarding or rebound.     Hernia: No hernia is present.  Musculoskeletal:        General: No swelling, tenderness, deformity or signs of injury. Normal range of motion.     Cervical back: Normal range of motion and neck supple. No rigidity or tenderness.     Right lower leg: No edema.     Left lower leg: No edema.  Feet:     Right foot:     Skin integrity: No blister.     Left foot:     Skin integrity: No blister.  Lymphadenopathy:     Cervical: No cervical adenopathy.     Right cervical: No superficial, deep or posterior cervical adenopathy.    Left cervical: No superficial, deep or posterior cervical adenopathy.     Upper Body:     Right upper body: No supraclavicular, axillary or pectoral adenopathy.     Left upper body: No supraclavicular, axillary or pectoral adenopathy.     Lower Body: No right inguinal adenopathy. No left inguinal adenopathy.  Skin:    General: Skin is warm  and dry.     Coloration: Skin is not jaundiced or pale.     Findings: No bruising, erythema, lesion or rash.  Neurological:     General: No focal deficit present.     Mental Status: Sydney Hunt is alert and oriented to person, place, and time. Mental status is at baseline.     Cranial Nerves: No cranial nerve deficit.     Sensory: No sensory deficit.     Motor: No weakness.     Coordination:  Coordination normal.     Gait: Gait normal.     Deep Tendon Reflexes: Reflexes normal.  Psychiatric:        Mood and Affect: Mood normal.        Behavior: Behavior normal.        Thought Content: Thought content normal.        Judgment: Judgment normal.    LABS:      Latest Ref Rng & Units 06/05/2023    9:06 AM 05/15/2023   10:58 AM 04/24/2023    8:50 AM  CBC  WBC 4.0 - 10.5 K/uL 9.1  10.5  12.5   Hemoglobin 12.0 - 15.0 g/dL 16.1  09.6  04.5   Hematocrit 36.0 - 46.0 % 42.7  42.1  43.8   Platelets 150 - 400 K/uL 291  308  334       Latest Ref Rng & Units 06/05/2023    9:06 AM 05/15/2023   10:58 AM 04/24/2023    8:50 AM  CMP  Glucose 70 - 99 mg/dL 66  409  97   BUN 8 - 23 mg/dL 12  15  16    Creatinine 0.44 - 1.00 mg/dL 8.11  9.14  7.82   Sodium 135 - 145 mmol/L 140  138  139   Potassium 3.5 - 5.1 mmol/L 4.8  4.5  4.3   Chloride 98 - 111 mmol/L 106  103  103   CO2 22 - 32 mmol/L 26  27  26    Calcium 8.9 - 10.3 mg/dL 9.8  9.7  95.6   Total Protein 6.5 - 8.1 g/dL 6.9  7.1  7.6   Total Bilirubin 0.0 - 1.2 mg/dL 1.1  1.0  1.3   Alkaline Phos 38 - 126 U/L 57  69  95   AST 15 - 41 U/L 18  14  16    ALT 0 - 44 U/L 8  <5  <5    Lab Results  Component Value Date   CEA1 5.3 (H) 10/17/2022   CEA 4.2 01/13/2023   /  CEA  Date Value Ref Range Status  01/13/2023 4.2  Final  10/17/2022 5.3 (H) 0.0 - 4.7 ng/mL Final    Comment:    (NOTE)                             Nonsmokers          <3.9                             Smokers             <5.6 [employer] Electrochemiluminescence  Immunoassay (ECLIA) Values obtained with different assay methods or kits cannot be used interchangeably.  Results cannot be interpreted as absolute evidence of the presence or absence of malignant disease. Performed At: [facility] [address] [place], [place] [id number] Pearlean Botts MD IO:[id number]    Lab Results  Component Value Date   TSH 5.122 (H) 05/15/2023   T4TOTAL 9.9 05/15/2023   Lab Results  Component Value Date   LDH 130 11/28/2022   STUDIES:  EXAM: 04/24/2023 CT CHEST WITH CONTRAST IMPRESSION: 1. Interval progression of mediastinal and hilar lymphadenopathy, highly suspicious for metastatic disease. 2. Bilateral pulmonary nodules have increased in size in the interval, also worrisome for metastatic progression. 3. Subpleural opacity in the posterior left lung is progressive in the  interval and may be atelectatic or evolving scar. Attention on follow-up recommended. 4. Tiny hypervascular focus in the anterior hepatic dome is too small to characterize but present previously. While likely benign, attention on follow-up recommended. 5.  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 Report date is 12/12/2020.     The Multi-Cancer + RNA Panel offered by [employer] includes sequencing and/or deletion/duplication analysis of the following 84 genes:  AIP*, ALK, APC*, ATM*, AXIN2*, BAP1*, BARD1*, BLM*,  BMPR1A*, BRCA1*, BRCA2*, BRIP1*, CASR, CDC73*, CDH1*, CDK4, CDKN1B*, CDKN1C*, CDKN2A, CEBPA, CHEK2*, CTNNA1*, DICER1*, DIS3L2*, EGFR, EPCAM, FH   GERD (gastroesophageal reflux disease)    Heartburn 12/27/2021   Hormone disorder 11/20/2020   Hyperbilirubinemia 09/26/2022   Hypertension    Hypokalemia 12/27/2021   Hypothyroidism due to medication 01/22/2022   Leukocytosis 01/17/2022   Migraines    Nodule of left lung 11/21/2020   Non-small cell lung cancer, left (HCC) 01/15/2021   Port-A-Cath in place 09/05/2022   Tobacco use 12/07/2020    Past Surgical History:  Procedure Laterality Date   ABDOMINAL HYSTERECTOMY  1989   unsure of vaginal or abdominal   BRONCHIAL BIOPSY  01/15/2021   Procedure: BRONCHIAL BIOPSIES;  Surgeon: Denson Flake, MD;  Location: [facility] ENDOSCOPY;  Service: Pulmonary;;   BRONCHIAL BRUSHINGS  01/15/2021   Procedure: BRONCHIAL BRUSHINGS;  Surgeon: Denson Flake, MD;  [facility] END[facility]COPY;  Service: Pulmonary;;   BRONCHIAL NEEDLE ASPIRATION BIOPSY  01/15/2021   Procedure: BRONCHIAL NEEDLE ASPIRATION BIOPSIES;  Surgeon: Denson Flake, MD;  Location: [facility] ENDOSCOPY;  Service: Pulmonary;;   COLONOSCOPY  2022   FIDUCIAL MARKER PLACEMENT  01/15/2021   Procedure: FIDUCIAL MARKER PLACEMENT;  Surgeon: Denson Flake, MD;  Location: [facility] ENDOSCOPY;  Service: Pulmonary;;   ovaries removed  1986   surgery to remove scar tissue     from colon/bladder in patient's late 30's   TUBAL LIGATION  1986   UPPER GI ENDOSCOPY     years ago in patient's late 27s   VIDEO BRONCHOSCOPY WITH RADIAL ENDOBRONCHIAL ULTRASOUND  01/15/2021   Procedure: VIDEO BRONCHOSCOPY WITH RADIAL ENDOBRONCHIAL ULTRASOUND;  Surgeon: Denson Flake, MD;  Location: [facility] ENDOSCOPY;  Service: Pulmonary;;    Family History  Problem Relation Age of Onset   Pancreatic cancer Maternal Aunt 50   Brain cancer Maternal Uncle    Stomach cancer Maternal Uncle        dx. >50   Prostate cancer Half-Brother         passed away at 90   Prostate cancer Half-Brother     Social History:  reports that Sydney Hunt quit smoking about 2 years ago. Her smoking use included cigarettes. Sydney Hunt started smoking about 62 years ago. Sydney Hunt has a 30.1 pack-year smoking history. Sydney Hunt has never used smokeless tobacco. Sydney Hunt reports current alcohol use of about 4.0 standard drinks of alcohol per week. Sydney Hunt reports current drug use. Frequency: 3.00 times  per week. Drug: Marijuana.The patient is alone today.  Allergies:  Allergies  Allergen Reactions   Codeine Rash   Demerol [Meperidine Hcl] Other (See Comments)    coma   Meperidine Other (See Comments)    Several days will pass without patient being aware  Several days will pass without patient being aware    coma   Other Dermatitis    Stainless steel / skin weeps   Silver Dermatitis    Sterling silver skin weeps   Gold Bond [Menthol-Zinc Oxide] Dermatitis    Skin weeps   Neosporin [Bacitracin-Polymyxin B] Dermatitis    Skin weeps   Wound Dressing Adhesive Other (See Comments)    CAUSES SKIN TEARS PER PATIENT. INSTEAD USE THE TEGADERM 72M WITH ADHESIVE FREE WINDOW OVER PORT.   Wound Dressings Other (See Comments)    CAUSES SKIN TEARS PER PATIENT. INSTEAD USE THE TEGADERM 72M WITH ADHESIVE FREE WINDOW OVER PORT.    Current Medications: Current Outpatient Medications  Medication Sig Dispense Refill   aspirin 81 MG EC tablet Take 81 mg by mouth daily.     atorvastatin (LIPITOR) 40 MG tablet Take 40 mg by mouth daily.     celecoxib (CELEBREX) 200 MG capsule Take 200 mg by mouth daily.     HYDROcodone -acetaminophen  (NORCO/VICODIN) 5-325 MG tablet Take 1 tablet by mouth every 6 (six) hours as needed for moderate pain (pain score 4-6) or severe pain (pain score 7-10). 30 tablet 0   levothyroxine  (SYNTHROID ) 75 MCG tablet Take 1 tablet (75 mcg total) by mouth daily. 30 tablet 5   lidocaine -prilocaine  (EMLA ) cream Apply 1 Application topically as needed. 30 g 0   ondansetron  (ZOFRAN )  4 MG tablet Take 1 tablet (4 mg total) by mouth every 4 (four) hours as needed for nausea. 90 tablet 3   pantoprazole  (PROTONIX ) 40 MG tablet TAKE 1 TABLET(40 MG) BY MOUTH DAILY 60 tablet 1   Prenatal Vit-Fe Fumarate-FA (PRENATAL VITAMIN) 27-0.8 MG TABS Take 1 tablet by mouth daily. 30 tablet 5   prochlorperazine  (COMPAZINE ) 10 MG tablet TAKE 1 TABLET(10 MG) BY MOUTH EVERY 6 HOURS AS NEEDED FOR NAUSEA OR VOMITING 360 tablet 0   VENTOLIN  HFA 108 (90 Base) MCG/ACT inhaler Inhale into the lungs.     No current facility-administered medications for this visit.    I,Jasmine M Lassiter,acting as a scribe for [name], MD.,have documented all relevant documentation on the behalf of [name], MD,as directed by  [name], MD while in the presence of [name], MD.

## 2023-06-05 ENCOUNTER — Encounter: Payer: Self-pay | Admitting: Oncology

## 2023-06-05 ENCOUNTER — Inpatient Hospital Stay

## 2023-06-05 ENCOUNTER — Inpatient Hospital Stay: Attending: Hematology and Oncology | Admitting: Oncology

## 2023-06-05 VITALS — BP 127/75 | HR 59 | Temp 97.9°F | Resp 18 | Ht 59.0 in | Wt 98.7 lb

## 2023-06-05 DIAGNOSIS — Z5112 Encounter for antineoplastic immunotherapy: Secondary | ICD-10-CM | POA: Diagnosis present

## 2023-06-05 DIAGNOSIS — Z808 Family history of malignant neoplasm of other organs or systems: Secondary | ICD-10-CM | POA: Insufficient documentation

## 2023-06-05 DIAGNOSIS — Z79899 Other long term (current) drug therapy: Secondary | ICD-10-CM | POA: Diagnosis not present

## 2023-06-05 DIAGNOSIS — C7801 Secondary malignant neoplasm of right lung: Secondary | ICD-10-CM

## 2023-06-05 DIAGNOSIS — R079 Chest pain, unspecified: Secondary | ICD-10-CM | POA: Diagnosis not present

## 2023-06-05 DIAGNOSIS — E162 Hypoglycemia, unspecified: Secondary | ICD-10-CM | POA: Diagnosis not present

## 2023-06-05 DIAGNOSIS — Z8042 Family history of malignant neoplasm of prostate: Secondary | ICD-10-CM | POA: Diagnosis not present

## 2023-06-05 DIAGNOSIS — E032 Hypothyroidism due to medicaments and other exogenous substances: Secondary | ICD-10-CM | POA: Insufficient documentation

## 2023-06-05 DIAGNOSIS — E876 Hypokalemia: Secondary | ICD-10-CM | POA: Diagnosis not present

## 2023-06-05 DIAGNOSIS — Z90710 Acquired absence of both cervix and uterus: Secondary | ICD-10-CM | POA: Diagnosis not present

## 2023-06-05 DIAGNOSIS — M21371 Foot drop, right foot: Secondary | ICD-10-CM | POA: Insufficient documentation

## 2023-06-05 DIAGNOSIS — I700 Atherosclerosis of aorta: Secondary | ICD-10-CM | POA: Insufficient documentation

## 2023-06-05 DIAGNOSIS — H9190 Unspecified hearing loss, unspecified ear: Secondary | ICD-10-CM | POA: Diagnosis not present

## 2023-06-05 DIAGNOSIS — D72829 Elevated white blood cell count, unspecified: Secondary | ICD-10-CM | POA: Insufficient documentation

## 2023-06-05 DIAGNOSIS — Z800 Family history of malignant neoplasm of digestive organs: Secondary | ICD-10-CM | POA: Insufficient documentation

## 2023-06-05 DIAGNOSIS — C3432 Malignant neoplasm of lower lobe, left bronchus or lung: Secondary | ICD-10-CM | POA: Insufficient documentation

## 2023-06-05 DIAGNOSIS — J439 Emphysema, unspecified: Secondary | ICD-10-CM | POA: Diagnosis not present

## 2023-06-05 DIAGNOSIS — Z79890 Hormone replacement therapy (postmenopausal): Secondary | ICD-10-CM | POA: Diagnosis not present

## 2023-06-05 DIAGNOSIS — C3492 Malignant neoplasm of unspecified part of left bronchus or lung: Secondary | ICD-10-CM

## 2023-06-05 DIAGNOSIS — R059 Cough, unspecified: Secondary | ICD-10-CM | POA: Diagnosis not present

## 2023-06-05 DIAGNOSIS — Z90722 Acquired absence of ovaries, bilateral: Secondary | ICD-10-CM | POA: Insufficient documentation

## 2023-06-05 DIAGNOSIS — C7802 Secondary malignant neoplasm of left lung: Secondary | ICD-10-CM | POA: Diagnosis not present

## 2023-06-05 DIAGNOSIS — R110 Nausea: Secondary | ICD-10-CM | POA: Insufficient documentation

## 2023-06-05 DIAGNOSIS — F1290 Cannabis use, unspecified, uncomplicated: Secondary | ICD-10-CM | POA: Insufficient documentation

## 2023-06-05 DIAGNOSIS — Z87891 Personal history of nicotine dependence: Secondary | ICD-10-CM | POA: Insufficient documentation

## 2023-06-05 DIAGNOSIS — Z95828 Presence of other vascular implants and grafts: Secondary | ICD-10-CM

## 2023-06-05 DIAGNOSIS — Z885 Allergy status to narcotic agent status: Secondary | ICD-10-CM | POA: Diagnosis not present

## 2023-06-05 DIAGNOSIS — Z79620 Long term (current) use of immunosuppressive biologic: Secondary | ICD-10-CM | POA: Insufficient documentation

## 2023-06-05 LAB — CBC WITH DIFFERENTIAL (CANCER CENTER ONLY)
Abs Immature Granulocytes: 0.02 10*3/uL (ref 0.00–0.07)
Basophils Absolute: 0.2 10*3/uL — ABNORMAL HIGH (ref 0.0–0.1)
Basophils Relative: 2 %
Eosinophils Absolute: 1.6 10*3/uL — ABNORMAL HIGH (ref 0.0–0.5)
Eosinophils Relative: 18 %
HCT: 42.7 % (ref 36.0–46.0)
Hemoglobin: 14.4 g/dL (ref 12.0–15.0)
Immature Granulocytes: 0 %
Lymphocytes Relative: 31 %
Lymphs Abs: 2.8 10*3/uL (ref 0.7–4.0)
MCH: 32.7 pg (ref 26.0–34.0)
MCHC: 33.7 g/dL (ref 30.0–36.0)
MCV: 97 fL (ref 80.0–100.0)
Monocytes Absolute: 0.7 10*3/uL (ref 0.1–1.0)
Monocytes Relative: 8 %
Neutro Abs: 3.8 10*3/uL (ref 1.7–7.7)
Neutrophils Relative %: 41 %
Platelet Count: 291 10*3/uL (ref 150–400)
RBC: 4.4 MIL/uL (ref 3.87–5.11)
RDW: 12.4 % (ref 11.5–15.5)
WBC Count: 9.1 10*3/uL (ref 4.0–10.5)
nRBC: 0 % (ref 0.0–0.2)

## 2023-06-05 LAB — CMP (CANCER CENTER ONLY)
ALT: 8 U/L (ref 0–44)
AST: 18 U/L (ref 15–41)
Albumin: 3.7 g/dL (ref 3.5–5.0)
Alkaline Phosphatase: 57 U/L (ref 38–126)
Anion gap: 8 (ref 5–15)
BUN: 12 mg/dL (ref 8–23)
CO2: 26 mmol/L (ref 22–32)
Calcium: 9.8 mg/dL (ref 8.9–10.3)
Chloride: 106 mmol/L (ref 98–111)
Creatinine: 0.85 mg/dL (ref 0.44–1.00)
GFR, Estimated: >60 mL/min (ref >60)
Glucose, Bld: 66 mg/dL — ABNORMAL LOW (ref 70–99)
Potassium: 4.8 mmol/L (ref 3.5–5.1)
Sodium: 140 mmol/L (ref 135–145)
Total Bilirubin: 1.1 mg/dL (ref 0.0–1.2)
Total Protein: 6.9 g/dL (ref 6.5–8.1)

## 2023-06-05 MED ORDER — SODIUM CHLORIDE 0.9% FLUSH
10.0000 mL | INTRAVENOUS | Status: DC | PRN
  Administered 2023-06-05: 10 mL

## 2023-06-05 MED ORDER — HEPARIN SOD (PORK) LOCK FLUSH 100 UNIT/ML IV SOLN
500.0000 [IU] | Freq: Once | INTRAVENOUS | Status: AC | PRN
  Administered 2023-06-05: 500 [IU]

## 2023-06-10 ENCOUNTER — Inpatient Hospital Stay

## 2023-06-10 VITALS — BP 124/77 | HR 68 | Temp 98.7°F | Resp 18

## 2023-06-10 DIAGNOSIS — Z5112 Encounter for antineoplastic immunotherapy: Secondary | ICD-10-CM | POA: Diagnosis not present

## 2023-06-10 DIAGNOSIS — C3492 Malignant neoplasm of unspecified part of left bronchus or lung: Secondary | ICD-10-CM

## 2023-06-10 MED ORDER — SODIUM CHLORIDE 0.9% FLUSH
10.0000 mL | INTRAVENOUS | Status: DC | PRN
  Administered 2023-06-10: 10 mL

## 2023-06-10 MED ORDER — HEPARIN SOD (PORK) LOCK FLUSH 100 UNIT/ML IV SOLN
500.0000 [IU] | Freq: Once | INTRAVENOUS | Status: AC | PRN
Start: 2023-06-10 — End: 2023-06-10
  Administered 2023-06-10: 500 [IU]

## 2023-06-10 MED ORDER — SODIUM CHLORIDE 0.9 % IV SOLN: INTRAVENOUS | Status: DC

## 2023-06-10 MED ORDER — SODIUM CHLORIDE 0.9 % IV SOLN
200.0000 mg | Freq: Once | INTRAVENOUS | Status: AC
  Administered 2023-06-10: 200 mg via INTRAVENOUS
  Filled 2023-06-10: qty 8

## 2023-06-10 NOTE — Patient Instructions (Signed)
 [facility] [place] - [facility]  Discharge Instructions: Thank you for choosing [facility] to provide your oncology and hematology care.  If you have a lab appointment with the Cancer Center, please go directly to the Cancer Center and check in at the registration area.   Wear comfortable clothing and clothing appropriate for easy access to any Portacath or PICC line.   We strive to give you quality time with your provider. You may need to reschedule your appointment if you arrive late (15 or more minutes).  Arriving late affects you and other patients whose appointments are after yours.  Also, if you miss three or more appointments without notifying the office, you may be dismissed from the clinic at the provider's discretion.      For prescription refill requests, have your pharmacy contact our office and allow 72 hours for refills to be completed.    Today you received the following chemotherapy and/or immunotherapy agents Keytruda      To help prevent nausea and vomiting after your treatment, we encourage you to take your nausea medication as directed.  BELOW ARE SYMPTOMS THAT SHOULD BE REPORTED IMMEDIATELY: *FEVER GREATER THAN 100.4 F (38 C) OR HIGHER *CHILLS OR SWEATING *NAUSEA AND VOMITING THAT IS NOT CONTROLLED WITH YOUR NAUSEA MEDICATION *UNUSUAL SHORTNESS OF BREATH *UNUSUAL BRUISING OR BLEEDING *URINARY PROBLEMS (pain or burning when urinating, or frequent urination) *BOWEL PROBLEMS (unusual diarrhea, constipation, pain near the anus) TENDERNESS IN MOUTH AND THROAT WITH OR WITHOUT PRESENCE OF ULCERS (sore throat, sores in mouth, or a toothache) UNUSUAL RASH, SWELLING OR PAIN  UNUSUAL VAGINAL DISCHARGE OR ITCHING   Items with * indicate a potential emergency and should be followed up as soon as possible or go to the Emergency Department if any problems should occur.  Please show the CHEMOTHERAPY ALERT CARD or IMMUNOTHERAPY ALERT  CARD at check-in to the Emergency Department and triage nurse.  Should you have questions after your visit or need to cancel or reschedule your appointment, please contact [facility] [place] [facility]  Dept: [phone]  and follow the prompts.  Office hours are 8:00 a.m. to 4:30 p.m. Monday - Friday. Please note that voicemails left after 4:00 p.m. may not be returned until the following business day.  We are closed weekends and major holidays. You have access to a nurse at all times for urgent questions. Please call the main number to the clinic Dept: [phone] and follow the prompts.  For any non-urgent questions, you may also contact your provider using MyChart. We now offer e-Visits for anyone 72 and older to request care online for non-urgent symptoms. For details visit [email or web address].   Also download the MyChart app! Go to the app store, search "MyChart", open the app, select [place], and log in with your MyChart username and password.

## 2023-06-14 ENCOUNTER — Encounter: Payer: Self-pay | Admitting: Oncology

## 2023-06-18 ENCOUNTER — Telehealth: Payer: Self-pay

## 2023-06-18 NOTE — Telephone Encounter (Signed)
 Pt called, states she just left PCP office. He told her to let you know that he is sending her to a neurologist for foot drop.

## 2023-06-19 ENCOUNTER — Encounter: Payer: Self-pay | Admitting: Oncology

## 2023-06-24 NOTE — Progress Notes (Signed)
 [facility]  [address] 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  She was treated with left lower lobectomy in [place] .  Her doctor there recommended chemotherapy with immunotherapy, but she refused chemotherapy.  PD-L1 was positive.  She was only willing to take the immunotherapy for 1 year. She started pembrolizumab  in August 2023.  We saw her in October 2023, when she returned to [place]  and continued pembrolizumab  every 21 days. Routine CT imaging had not shown any evidence of recurrence.  She completed 17 cycles of adjuvant pembrolizumab  in August and CT chest following that was negative for recurrence. There were stable scattered small bilateral pulmonary nodules, felt to be from benign emphysema.  Surgical changes were seen in the left lower lobe.    Unfortunately, she had evidence of recurrence on CT chest in December of 2024, which revealed interval progression in the size and number of multiple bilateral small pulmonary nodules most consistent with progression of metastatic disease. We decided not to pursue a biopsy in view of the difficulty involved. Treatment options were discussed and she wished not to pursue chemotherapy unless absolutely necessary.  She was placed back on pembrolizumab  and had a repeat CT on 04/24/2023. CT chest describes progression of disease as the mediastinal and hilar nodes are mildly larger, the pulmonary nodules are minimally larger, and there is still a subpleural opacity which may be atelectasis or evolving scar tissue. An 3 mm tiny hypervascular focus in the anterior hepatic dome that was previously present and an 8 mm subcapsular low-density lesion medial right liver that is stable, but both are too small to characterize. However,  when I reviewed the images and measurements, the differences are minimal. So we will continue the immunotherapy and repeat scans in 3 months.   Malignant neoplasm metastatic to lung [facility]) CT chest in December 2024 revealed multiple bilateral pulmonary nodules which increased in size and number since imaging in August 2024.  These measure up to 7 mm in diameter.  There was an area of pleural plaque in the posteriomedial left upper lobe, measuring 2.9 cm.  Prior PD-L1 was positive at 90%, so she resumed pembrolizumab  in January.  We don't have the radiologist's report on her current CT but I reviewed the images with her and her son, and the change in disease seems minimal.   Hypothyroidism due to medication The TSH has fluctuated up and down.  She is currently on levothyroxine  75 mcg daily.  Her last TSH was slightly elevated at 5.322.  TSH is pending from today.   Hypokalemia Resolved with potassium chloride  20 mEq daily.  Since her potassium was up to 4.8 and I stopped the supplement but now her potassium has dropped back down to 3.5 and she will resume it. .   Plan: Patient informed me that she developed right foot drop when out of town. She has an appointment with neurology but it is not until September. Patient states that she is doing foot exercises and I informed her of a foot brace that can help her with walking. I will refer her to physical therapy for this and will order a MRI of the brain for further evaluation. She states that her increased shortness of breath with center chest pain developed when she went to  [place]  as the weather was hot and humid, this also caused her to wheeze but this has stopped since returning.  I will order a chest x-ray for further evaluation in view of the dyspnea and physical findings. I think she has acute bronchitis and I will prescribe a Z-pack 250 mg to take BID today and then 1 daily for 5 days. Her day 1 cycle 8 of Keytruda  is scheduled on 07/01/2023 and I  think she can proceed unless she has worsening next week. She has a elevated WBC of 11.3, hemoglobin of 15.7, and platelet count of 298,000. Her CMP is normal other than a mildly elevated total bilirubin of 1.5. 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 Her potassium did drop from 4.8 to 3.5 so I advised her to restart her oral potassium supplement one daily. I will see her back in 3 weeks with CBC, CMP, TSH, T4, UGT1A1, and CT chest.  The patient understands the plans discussed today and is in agreement with them.  She knows to contact our office if she develops concerns prior to her next appointment.  I provided 25 minutes of face-to-face time during this encounter and > 50% was spent counseling as documented under my assessment and plan.   [name], MD  [facility] [address] [place] [address] Dept: [phone] Dept Fax: [phone]   No orders of the defined types were placed in this encounter.   CHIEF COMPLAINT:  CC: Recurrent metastatic adenocarcinoma of the lung  Current Treatment: Pembrolizumab  every 3 weeks  HISTORY OF PRESENT ILLNESS:   Oncology History  Non-small cell lung cancer, left (HCC)  01/15/2021 Initial Diagnosis   Non-small cell lung cancer, left (HCC)   02/12/2021 Cancer Staging   Staging form: Lung, AJCC 8th Edition - Clinical stage from 02/12/2021: Stage IA2 (cT1b, cN0, cM0) - Signed by [name], MD on 02/14/2021 Histopathologic type: Adenocarcinoma, NOS Stage prefix: Initial diagnosis Histologic grade (G): GX Histologic grading system: 4 grade system Laterality: Left Tumor size (mm): 19 Lymph-vascular invasion (LVI): LVI not present (absent)/not identified Diagnostic confirmation: Positive histology Specimen type: Bronchial Biopsy Staged by: Managing physician Stage used in treatment planning:  Yes National guidelines used in treatment planning: Yes Type of national guideline used in treatment planning: NCCN Staging comments: Rec surgical resection   05/11/2021 Cancer Staging   Staging form: Lung, AJCC 8th Edition - Pathologic stage from 05/11/2021: Stage IIB (ypT1c, pN1, cM0) - Signed by [name], MD on 11/12/2021 Histopathologic type: Adenocarcinoma, NOS Stage prefix: Post-therapy Histologic grade (G): G2 Histologic grading system: 4 grade system Residual tumor (R): R0 - None Laterality: Left Tumor size (mm): 25 Lymph-vascular invasion (LVI): LVI not present (absent)/not identified Diagnostic confirmation: Positive histology PLUS positive immunophenotyping and/or positive genetic studies Specimen type: Excision Staged by: Managing physician Type of lung cancer: Surgically resected non-small cell lung cancer ECOG performance status: Grade 1 Perineural invasion (PNI): Absent Pleural/elastic layer invasion: PL0 Pleural lavage cytology: Unknown Weight loss: Absent Adequacy of mediastinal dissection: Adequate Radiotherapy dose: No Adjuvant radiation: No Adjuvant chemotherapy: Yes Stage used in treatment planning: Yes National guidelines used in treatment planning: Yes Type of national guideline used in treatment planning: NCCN Staging comments: Adjuvant pembrolizumab , PDL 1 90%   10/04/2021 - 10/01/2022 Chemotherapy   Patient is on Treatment Plan : LUNG NSCLC Pembrolizumab  Adj  q21d x 18 cycles     02/04/2023 -  Chemotherapy   Patient is on Treatment Plan : LUNG NSCLC Pembrolizumab  (200) q21d       INTERVAL HISTORY:  Sydney Hunt is here today for repeat clinical for her recurrent metastatic adenocarcinoma of the lung. Patient states that she feels ok but complains of increased shortness of breath, mild cough, and developed right foot drop. She has an appointment with neurology but it is not until September. Patient states that she is doing foot exercises and I informed  her of a foot brace that can help her with walking. I will refer her to physical therapy for this and will order a MRI of the brain for further evaluation. She states that her increased shortness of breath with center chest pain developed when she went to [place]  as the weather was hot and humid, this also caused her to wheeze but this has stopped since returning.  I will order a chest x-ray for further evaluation in view of the dyspnea and physical findings. I think she has acute bronchitis and will prescribe a Z-pack 250 mg to take BID today and then 1 daily for 5 days. Her day 1 cycle 8 of Keytruda  is scheduled on 07/01/2023 and I think we can proceed unless she has worsening by next week. She has a elevated WBC of 11.3, hemoglobin of 15.7, and platelet count of 298,000. Her CMP is normal other than a mildly elevated total bilirubin of 1.5. 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 Her potassium did drop from 4.8 to 3.5 so I advised her to restart her oral potassium supplement one daily. I will see her back in 3 weeks with CBC, CMP, TSH, T4, UGT1A1, and CT chest.   She denies fever, chills, night sweats, or other signs of infection. She denies gastrointestinal issues. Her appetite is good and Her weight has decreased 1 pounds over last 3 weeks.    REVIEW OF SYSTEMS:  Review of Systems  Constitutional: Negative.  Negative for appetite change, chills, diaphoresis, fatigue, fever and unexpected weight change.  HENT:   Positive for hearing loss (hearing aid). Negative for lump/mass, mouth sores, nosebleeds, sore throat, tinnitus, trouble swallowing and voice change.   Eyes:  Positive for eye problems (watery). Negative for icterus.  Respiratory:  Positive for cough (with congestion, clear sputum) and shortness of breath (increased). Negative for chest tightness, hemoptysis and wheezing.   Cardiovascular:  Positive for chest pain (in the center). Negative for  leg swelling and palpitations.  Gastrointestinal:  Positive for nausea (after treatment). Negative for abdominal distention, abdominal pain, blood in stool, constipation, diarrhea, rectal pain and vomiting.  Endocrine: Negative.  Negative for hot flashes.  Genitourinary: Negative.  Negative for bladder incontinence, difficulty urinating, dyspareunia, dysuria, frequency, hematuria, menstrual problem, nocturia, pelvic pain, vaginal bleeding and vaginal discharge.   Musculoskeletal: Negative.  Negative for arthralgias, back pain, flank pain, gait problem, myalgias, neck pain and neck stiffness.  Skin: Negative.  Negative for itching, rash and wound.  Neurological:  Negative for dizziness, extremity weakness, gait problem, headaches, light-headedness, numbness, seizures and speech difficulty.  Hematological: Negative.  Negative for adenopathy. Does not bruise/bleed easily.  Psychiatric/Behavioral: Negative.  Negative for confusion, decreased concentration, depression, sleep disturbance and suicidal ideas. The patient is not nervous/anxious.    VITALS:  Blood pressure (!) 144/88, pulse 91, temperature 98 F (36.7 C), temperature source Oral, resp. rate 20, height 4'  11" (1.499 m), weight 97 lb 3.2 oz (44.1 kg), SpO2 95%.  Wt Readings from Last 3 Encounters:  06/26/23 97 lb 3.2 oz (44.1 kg)  06/05/23 98 lb 11.2 oz (44.8 kg)  05/20/23 95 lb 1.9 oz (43.1 kg)    Body mass index is 19.63 kg/m.  Performance status (ECOG): 1 - Symptomatic but completely ambulatory  PHYSICAL EXAM:  Physical Exam Vitals and nursing note reviewed. Exam conducted with a chaperone present.  Constitutional:      General: She is not in acute distress.    Appearance: Normal appearance. She is normal weight. She is not ill-appearing, toxic-appearing or diaphoretic.  HENT:     Head: Normocephalic and atraumatic.     Right Ear: Tympanic membrane, ear canal and external ear normal. There is no impacted cerumen.     Left Ear:  Tympanic membrane, ear canal and external ear normal. There is no impacted cerumen.     Nose: Nose normal. No congestion or rhinorrhea.     Mouth/Throat:     Mouth: Mucous membranes are moist.     Pharynx: Oropharynx is clear. No oropharyngeal exudate or posterior oropharyngeal erythema.  Eyes:     General: No scleral icterus.       Right eye: No discharge.        Left eye: No discharge.     Extraocular Movements: Extraocular movements intact.     Conjunctiva/sclera: Conjunctivae normal.     Pupils: Pupils are equal, round, and reactive to light.  Neck:     Vascular: No carotid bruit.  Cardiovascular:     Rate and Rhythm: Normal rate and regular rhythm.     Pulses: Normal pulses.     Heart sounds: Normal heart sounds. No murmur heard.    No friction rub. No gallop.  Pulmonary:     Effort: Pulmonary effort is normal. No respiratory distress.     Breath sounds: No stridor. Examination of the left-upper field reveals wheezing. Examination of the right-lower field reveals rales. Examination of the left-lower field reveals rales. Wheezing (expiratory) and rales (L>R) present. No rhonchi.  Chest:     Chest wall: No tenderness.  Abdominal:     General: Bowel sounds are normal. There is no distension.     Palpations: Abdomen is soft. There is no hepatomegaly, splenomegaly or mass.     Tenderness: There is no abdominal tenderness. There is no right CVA tenderness, left CVA tenderness, guarding or rebound.     Hernia: No hernia is present.  Musculoskeletal:        General: No swelling, tenderness, deformity or signs of injury. Normal range of motion.     Cervical back: Normal range of motion and neck supple. No rigidity or tenderness.     Right lower leg: No edema.     Left lower leg: No edema.  Feet:     Right foot:     Skin integrity: No blister.     Left foot:     Skin integrity: No blister.  Lymphadenopathy:     Cervical: No cervical adenopathy.     Right cervical: No superficial,  deep or posterior cervical adenopathy.    Left cervical: No superficial, deep or posterior cervical adenopathy.     Upper Body:     Right upper body: No supraclavicular, axillary or pectoral adenopathy.     Left upper body: No supraclavicular, axillary or pectoral adenopathy.     Lower Body: No right inguinal adenopathy. No left inguinal  adenopathy.  Skin:    General: Skin is warm and dry.     Coloration: Skin is not jaundiced or pale.     Findings: No bruising, erythema, lesion or rash.  Neurological:     General: No focal deficit present.     Mental Status: She is alert and oriented to person, place, and time. Mental status is at baseline.     Cranial Nerves: No cranial nerve deficit.     Sensory: No sensory deficit.     Motor: No weakness.     Coordination: Coordination normal.     Gait: Gait normal.     Deep Tendon Reflexes: Reflexes normal.  Psychiatric:        Mood and Affect: Mood normal.        Behavior: Behavior normal.        Thought Content: Thought content normal.        Judgment: Judgment normal.    LABS:      Latest Ref Rng & Units 06/26/2023    9:40 AM 06/05/2023    9:06 AM 05/15/2023   10:58 AM  CBC  WBC 4.0 - 10.5 K/uL 11.3  9.1  10.5   Hemoglobin 12.0 - 15.0 g/dL 16.1  09.6  04.5   Hematocrit 36.0 - 46.0 % 46.1  42.7  42.1   Platelets 150 - 400 K/uL 298  291  308       Latest Ref Rng & Units 06/26/2023    9:40 AM 06/05/2023    9:06 AM 05/15/2023   10:58 AM  CMP  Glucose 70 - 99 mg/dL 409  66  811   BUN 8 - 23 mg/dL 11  12  15    Creatinine 0.44 - 1.00 mg/dL 9.14  7.82  9.56   Sodium 135 - 145 mmol/L 145  140  138   Potassium 3.5 - 5.1 mmol/L 3.5  4.8  4.5   Chloride 98 - 111 mmol/L 106  106  103   CO2 22 - 32 mmol/L 26  26  27    Calcium 8.9 - 10.3 mg/dL 21.3  9.8  9.7   Total Protein 6.5 - 8.1 g/dL 7.4  6.9  7.1   Total Bilirubin 0.0 - 1.2 mg/dL 1.5  1.1  1.0   Alkaline Phos 38 - 126 U/L 112  57  69   AST 15 - 41 U/L 16  18  14    ALT 0 - 44 U/L 8  8   <5    Lab Results  Component Value Date   CEA1 5.3 (H) 10/17/2022   CEA 4.2 01/13/2023   /  CEA  Date Value Ref Range Status  01/13/2023 4.2  Final  10/17/2022 5.3 (H) 0.0 - 4.7 ng/mL Final    Comment:    (NOTE)                             Nonsmokers          <3.9                             Smokers             <5.6 [employer] Electrochemiluminescence Immunoassay (ECLIA) Values obtained with different assay methods or kits cannot be used interchangeably.  Results cannot be interpreted as absolute evidence of the presence or absence of malignant disease. Performed At: [facility]  [place] [address] [place], [place] [id number] Pearlean Botts MD WU:[id number]    Lab Results  Component Value Date   TSH 5.122 (H) 05/15/2023   T4TOTAL 9.9 05/15/2023   Lab Results  Component Value Date   LDH 130 11/28/2022   STUDIES:  EXAM: 04/24/2023 CT CHEST WITH CONTRAST IMPRESSION: 1. Interval progression of mediastinal and hilar lymphadenopathy, highly suspicious for metastatic disease. 2. Bilateral pulmonary nodules have increased in size in the interval, also worrisome for metastatic progression. 3. Subpleural opacity in the posterior left lung is progressive in the interval and may be atelectatic or evolving scar. Attention on follow-up recommended. 4. Tiny hypervascular focus in the anterior hepatic dome is too small to characterize but present previously. While likely benign, attention on follow-up recommended. 5.  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 Report date is 12/12/2020.     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   Leukocytosis 01/17/2022   Migraines    Nodule of left lung 11/21/2020   Non-small cell lung cancer, left (HCC) 01/15/2021   Port-A-Cath in place 09/05/2022   Tobacco use 12/07/2020    Past Surgical History:  Procedure Laterality Date   ABDOMINAL HYSTERECTOMY  1989   unsure of vaginal or abdominal   BRONCHIAL BIOPSY  01/15/2021   Procedure: BRONCHIAL BIOPSIES;  Surgeon: Denson Flake, MD;  Location: [facility] ENDOSCOPY;  Service: Pulmonary;;   BRONCHIAL BRUSHINGS  01/15/2021   Procedure: BRONCHIAL BRUSHINGS;  Surgeon: Denson Flake, MD;  Location[facility]COPY;  Service: Pulmonary;;   BRONCHIAL NEEDLE ASPIRATION BIOPSY  01/15/2021   Procedure: BRONCHIAL NEEDLE ASPIRATION BIOPSIES;  Surgeon: Denson Flake, MD;  Location: [facility] ENDOSCOPY;  Service: Pulmonary;;   COLONOSCOPY  2022   FIDUCIAL MARKER PLACEMENT  01/15/2021   Procedure: FIDUCIAL MARKER PLACEMENT;  Surgeon: Denson Flake, MD;  Location: [facility] ENDOSCOPY;  Service: Pulmonary;;   ovaries removed  1986   surgery to remove scar tissue     from colon/bladder in patient's late 30's   TUBAL LIGATION  1986   UPPER GI ENDOSCOPY     years ago in patient's late 29s   VIDEO BRONCHOSCOPY WITH RADIAL ENDOBRONCHIAL  ULTRASOUND  01/15/2021   Procedure: VIDEO BRONCHOSCOPY WITH RADIAL ENDOBRONCHIAL ULTRASOUND;  Surgeon: Denson Flake, MD;  Location: [facility] ENDOSCOPY;  Service: Pulmonary;;    Family History  Problem Relation Age of Onset   Pancreatic cancer Maternal Aunt 60   Brain cancer Maternal Uncle    Stomach  cancer Maternal Uncle        dx. >50   Prostate cancer Half-Brother        passed away at 29   Prostate cancer Half-Brother     Social History:  reports that she quit smoking about 2 years ago. Her smoking use included cigarettes. She started smoking about 62 years ago. She has a 30.1 pack-year smoking history. She has never used smokeless tobacco. She reports current alcohol use of about 4.0 standard drinks of alcohol per week. She reports current drug use. Frequency: 3.00 times per week. Drug: Marijuana.The patient is alone today.  Allergies:  Allergies  Allergen Reactions   Codeine Rash   Demerol [Meperidine Hcl] Other (See Comments)    coma   Meperidine Other (See Comments)    Several days will pass without patient being aware  Several days will pass without patient being aware    coma   Other Dermatitis    Stainless steel / skin weeps   Silver Dermatitis    Sterling silver skin weeps   Gold Bond [Menthol-Zinc Oxide] Dermatitis    Skin weeps   Neosporin [Bacitracin-Polymyxin B] Dermatitis    Skin weeps   Wound Dressing Adhesive Other (See Comments)    CAUSES SKIN TEARS PER PATIENT. INSTEAD USE THE TEGADERM 76M WITH ADHESIVE FREE WINDOW OVER PORT.   Wound Dressings Other (See Comments)    CAUSES SKIN TEARS PER PATIENT. INSTEAD USE THE TEGADERM 76M WITH ADHESIVE FREE WINDOW OVER PORT.    Current Medications: Current Outpatient Medications  Medication Sig Dispense Refill   aspirin 81 MG EC tablet Take 81 mg by mouth daily.     atorvastatin (LIPITOR) 40 MG tablet Take 40 mg by mouth daily.     azithromycin  (ZITHROMAX  Z-PAK) 250 MG tablet 2 pills today, then 1 pill daily 6 each 0    celecoxib (CELEBREX) 200 MG capsule Take 200 mg by mouth daily.     HYDROcodone -acetaminophen  (NORCO/VICODIN) 5-325 MG tablet Take 1 tablet by mouth every 6 (six) hours as needed for moderate pain (pain score 4-6) or severe pain (pain score 7-10). 30 tablet 0   levothyroxine  (SYNTHROID ) 75 MCG tablet Take 1 tablet (75 mcg total) by mouth daily. 30 tablet 5   lidocaine -prilocaine  (EMLA ) cream Apply 1 Application topically as needed. 30 g 0   ondansetron  (ZOFRAN ) 4 MG tablet Take 1 tablet (4 mg total) by mouth every 4 (four) hours as needed for nausea. 90 tablet 3   pantoprazole  (PROTONIX ) 40 MG tablet TAKE 1 TABLET(40 MG) BY MOUTH DAILY 60 tablet 1   Prenatal Vit-Fe Fumarate-FA (PRENATAL VITAMIN) 27-0.8 MG TABS Take 1 tablet by mouth daily. 30 tablet 5   prochlorperazine  (COMPAZINE ) 10 MG tablet TAKE 1 TABLET(10 MG) BY MOUTH EVERY 6 HOURS AS NEEDED FOR NAUSEA OR VOMITING 360 tablet 0   VENTOLIN  HFA 108 (90 Base) MCG/ACT inhaler Inhale into the lungs.     No current facility-administered medications for this visit.    I,Jasmine M Lassiter,acting as a scribe for [name], MD.,have documented all relevant documentation on the behalf of [name], MD,as directed by  [name], MD while in the presence of [name], MD.

## 2023-06-25 ENCOUNTER — Other Ambulatory Visit: Payer: Self-pay

## 2023-06-26 ENCOUNTER — Ambulatory Visit (HOSPITAL_BASED_OUTPATIENT_CLINIC_OR_DEPARTMENT_OTHER)
Admission: RE | Admit: 2023-06-26 | Discharge: 2023-06-26 | Disposition: A | Discharge status: Home / Self Care | Source: Ambulatory Visit | Attending: Oncology | Admitting: Oncology

## 2023-06-26 ENCOUNTER — Other Ambulatory Visit: Payer: Self-pay | Admitting: Oncology

## 2023-06-26 ENCOUNTER — Encounter: Payer: Self-pay | Admitting: Oncology

## 2023-06-26 ENCOUNTER — Telehealth: Payer: Self-pay | Admitting: Oncology

## 2023-06-26 ENCOUNTER — Ambulatory Visit

## 2023-06-26 ENCOUNTER — Inpatient Hospital Stay

## 2023-06-26 ENCOUNTER — Inpatient Hospital Stay (HOSPITAL_BASED_OUTPATIENT_CLINIC_OR_DEPARTMENT_OTHER): Admitting: Oncology

## 2023-06-26 VITALS — BP 144/88 | HR 91 | Temp 98.0°F | Resp 20 | Ht 59.0 in | Wt 97.2 lb

## 2023-06-26 DIAGNOSIS — R059 Cough, unspecified: Secondary | ICD-10-CM | POA: Diagnosis not present

## 2023-06-26 DIAGNOSIS — C3492 Malignant neoplasm of unspecified part of left bronchus or lung: Secondary | ICD-10-CM | POA: Diagnosis not present

## 2023-06-26 DIAGNOSIS — C7802 Secondary malignant neoplasm of left lung: Secondary | ICD-10-CM | POA: Diagnosis not present

## 2023-06-26 DIAGNOSIS — R0600 Dyspnea, unspecified: Secondary | ICD-10-CM

## 2023-06-26 DIAGNOSIS — C3490 Malignant neoplasm of unspecified part of unspecified bronchus or lung: Secondary | ICD-10-CM

## 2023-06-26 DIAGNOSIS — C7801 Secondary malignant neoplasm of right lung: Secondary | ICD-10-CM | POA: Diagnosis not present

## 2023-06-26 DIAGNOSIS — R0602 Shortness of breath: Secondary | ICD-10-CM | POA: Diagnosis not present

## 2023-06-26 DIAGNOSIS — J209 Acute bronchitis, unspecified: Secondary | ICD-10-CM

## 2023-06-26 DIAGNOSIS — Z5112 Encounter for antineoplastic immunotherapy: Secondary | ICD-10-CM | POA: Diagnosis not present

## 2023-06-26 LAB — CMP (CANCER CENTER ONLY)
ALT: 8 U/L (ref 0–44)
AST: 16 U/L (ref 15–41)
Albumin: 4.2 g/dL (ref 3.5–5.0)
Alkaline Phosphatase: 112 U/L (ref 38–126)
Anion gap: 13 (ref 5–15)
BUN: 11 mg/dL (ref 8–23)
CO2: 26 mmol/L (ref 22–32)
Calcium: 10.1 mg/dL (ref 8.9–10.3)
Chloride: 106 mmol/L (ref 98–111)
Creatinine: 0.87 mg/dL (ref 0.44–1.00)
GFR, Estimated: >60 mL/min (ref >60)
Glucose, Bld: 104 mg/dL — ABNORMAL HIGH (ref 70–99)
Potassium: 3.5 mmol/L (ref 3.5–5.1)
Sodium: 145 mmol/L (ref 135–145)
Total Bilirubin: 1.5 mg/dL — ABNORMAL HIGH (ref 0.0–1.2)
Total Protein: 7.4 g/dL (ref 6.5–8.1)

## 2023-06-26 LAB — CBC WITH DIFFERENTIAL (CANCER CENTER ONLY)
Abs Immature Granulocytes: 0.03 10*3/uL (ref 0.00–0.07)
Basophils Absolute: 0.2 10*3/uL — ABNORMAL HIGH (ref 0.0–0.1)
Basophils Relative: 2 %
Eosinophils Absolute: 2.6 10*3/uL — ABNORMAL HIGH (ref 0.0–0.5)
Eosinophils Relative: 23 %
HCT: 46.1 % — ABNORMAL HIGH (ref 36.0–46.0)
Hemoglobin: 15.7 g/dL — ABNORMAL HIGH (ref 12.0–15.0)
Immature Granulocytes: 0 %
Lymphocytes Relative: 21 %
Lymphs Abs: 2.3 10*3/uL (ref 0.7–4.0)
MCH: 32.4 pg (ref 26.0–34.0)
MCHC: 34.1 g/dL (ref 30.0–36.0)
MCV: 95.1 fL (ref 80.0–100.0)
Monocytes Absolute: 1 10*3/uL (ref 0.1–1.0)
Monocytes Relative: 9 %
Neutro Abs: 5.2 10*3/uL (ref 1.7–7.7)
Neutrophils Relative %: 45 %
Platelet Count: 298 10*3/uL (ref 150–400)
RBC: 4.85 MIL/uL (ref 3.87–5.11)
RDW: 12.6 % (ref 11.5–15.5)
WBC Count: 11.3 10*3/uL — ABNORMAL HIGH (ref 4.0–10.5)
nRBC: 0 % (ref 0.0–0.2)

## 2023-06-26 MED ORDER — AZITHROMYCIN 250 MG PO TABS
ORAL_TABLET | ORAL | 0 refills | Status: DC
Start: 2023-06-26 — End: 2023-07-08

## 2023-06-26 NOTE — Telephone Encounter (Signed)
 Patient has been scheduled for follow-up visit per 06/26/23 LOS.  Pt given an appt calendar with date and time.

## 2023-06-27 ENCOUNTER — Telehealth: Payer: Self-pay

## 2023-06-27 ENCOUNTER — Encounter: Payer: Self-pay | Admitting: Oncology

## 2023-06-27 NOTE — Telephone Encounter (Signed)
 MRI is scheduled for July 17. Will need something for nerves since patient is claustrophobic. Patient made aware of message.

## 2023-06-27 NOTE — Telephone Encounter (Signed)
-----   Message from [name] sent at 06/26/2023  3:30 PM EDT ----- Regarding: call Tell her I don't have official report but it looks clear.  The sounds I am hearing are prob from her bronchitis

## 2023-06-30 ENCOUNTER — Other Ambulatory Visit: Payer: Self-pay | Admitting: Oncology

## 2023-06-30 ENCOUNTER — Telehealth: Payer: Self-pay | Admitting: Cardiology

## 2023-06-30 DIAGNOSIS — C7801 Secondary malignant neoplasm of right lung: Secondary | ICD-10-CM

## 2023-06-30 MED ORDER — LORAZEPAM 1 MG PO TABS: 1.0000 mg | ORAL_TABLET | Freq: Three times a day (TID) | ORAL | 0 refills | Status: AC

## 2023-06-30 NOTE — Telephone Encounter (Signed)
 New Message:      Patient wants Dr Lafayette Pierre to know that  since she last saw him, she now had right drop foot.

## 2023-07-01 ENCOUNTER — Encounter: Payer: Self-pay | Admitting: Oncology

## 2023-07-01 ENCOUNTER — Inpatient Hospital Stay: Attending: Hematology and Oncology

## 2023-07-01 VITALS — BP 138/77 | HR 78 | Temp 98.0°F | Resp 18

## 2023-07-01 DIAGNOSIS — Z79899 Other long term (current) drug therapy: Secondary | ICD-10-CM | POA: Insufficient documentation

## 2023-07-01 DIAGNOSIS — C3492 Malignant neoplasm of unspecified part of left bronchus or lung: Secondary | ICD-10-CM

## 2023-07-01 DIAGNOSIS — Z5112 Encounter for antineoplastic immunotherapy: Secondary | ICD-10-CM | POA: Insufficient documentation

## 2023-07-01 DIAGNOSIS — Z79620 Long term (current) use of immunosuppressive biologic: Secondary | ICD-10-CM | POA: Diagnosis not present

## 2023-07-01 DIAGNOSIS — C3432 Malignant neoplasm of lower lobe, left bronchus or lung: Secondary | ICD-10-CM | POA: Insufficient documentation

## 2023-07-01 MED ORDER — SODIUM CHLORIDE 0.9 % IV SOLN
200.0000 mg | Freq: Once | INTRAVENOUS | Status: AC
  Administered 2023-07-01: 200 mg via INTRAVENOUS
  Filled 2023-07-01: qty 8

## 2023-07-01 MED ORDER — SODIUM CHLORIDE 0.9% FLUSH: 10.0000 mL | INTRAVENOUS | Status: DC | PRN

## 2023-07-01 MED ORDER — HEPARIN SOD (PORK) LOCK FLUSH 100 UNIT/ML IV SOLN
500.0000 [IU] | Freq: Once | INTRAVENOUS | Status: DC | PRN
Start: 2023-07-01 — End: 2023-07-01

## 2023-07-01 MED ORDER — SODIUM CHLORIDE 0.9 % IV SOLN: INTRAVENOUS | Status: DC

## 2023-07-01 NOTE — Patient Instructions (Signed)

## 2023-07-08 ENCOUNTER — Telehealth: Payer: Self-pay

## 2023-07-08 ENCOUNTER — Other Ambulatory Visit: Payer: Self-pay | Admitting: Hematology and Oncology

## 2023-07-08 DIAGNOSIS — J209 Acute bronchitis, unspecified: Secondary | ICD-10-CM

## 2023-07-08 MED ORDER — AZITHROMYCIN 250 MG PO TABS: ORAL_TABLET | ORAL | 0 refills | Status: DC

## 2023-07-08 NOTE — Telephone Encounter (Signed)
 Pt saw Dr Almer Jacobson about a week ago she said. She gave her a Zpack for cough. She told her to call her back if it didn't take care of it. Pt reports she is still coughing frequently-productive @ times (sputum is clear to yellow in color). Afebrile.

## 2023-07-10 ENCOUNTER — Encounter: Payer: Self-pay | Admitting: Oncology

## 2023-07-10 ENCOUNTER — Inpatient Hospital Stay

## 2023-07-10 ENCOUNTER — Other Ambulatory Visit: Payer: Self-pay | Admitting: Hematology and Oncology

## 2023-07-10 ENCOUNTER — Ambulatory Visit (HOSPITAL_BASED_OUTPATIENT_CLINIC_OR_DEPARTMENT_OTHER)
Admission: RE | Admit: 2023-07-10 | Discharge: 2023-07-10 | Disposition: A | Discharge status: Home / Self Care | Source: Ambulatory Visit | Attending: Oncology | Admitting: Oncology

## 2023-07-10 DIAGNOSIS — Z5112 Encounter for antineoplastic immunotherapy: Secondary | ICD-10-CM | POA: Diagnosis not present

## 2023-07-10 DIAGNOSIS — R17 Unspecified jaundice: Secondary | ICD-10-CM

## 2023-07-10 DIAGNOSIS — C3492 Malignant neoplasm of unspecified part of left bronchus or lung: Secondary | ICD-10-CM | POA: Diagnosis not present

## 2023-07-10 MED ORDER — IOHEXOL 300 MG/ML  SOLN
100.0000 mL | Freq: Once | INTRAMUSCULAR | Status: AC | PRN
  Administered 2023-07-10: 100 mL via INTRAVENOUS

## 2023-07-10 MED ORDER — HEPARIN SOD (PORK) LOCK FLUSH 100 UNIT/ML IV SOLN
500.0000 [IU] | Freq: Once | INTRAVENOUS | Status: AC
  Administered 2023-07-10: 500 [IU] via INTRAVENOUS

## 2023-07-15 ENCOUNTER — Encounter: Payer: Self-pay | Admitting: Oncology

## 2023-07-17 ENCOUNTER — Ambulatory Visit: Admitting: Oncology

## 2023-07-17 LAB — MISC LABCORP TEST (SEND OUT): Labcorp test code: 511200

## 2023-07-20 NOTE — Progress Notes (Signed)
 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  She was treated with left lower lobectomy in [place] .  Her doctor there recommended chemotherapy with immunotherapy, but she refused chemotherapy.  PD-L1 was positive.  She was only willing to take the immunotherapy for 1 year. She started pembrolizumab  in August 2023.  We saw her in October 2023, when she returned to [place]  and continued pembrolizumab  every 21 days. Routine CT imaging had not shown any evidence of recurrence.  She completed 17 cycles of adjuvant pembrolizumab  in August and CT chest following that was negative for recurrence. There were stable scattered small bilateral pulmonary nodules, felt to be from benign emphysema.  Surgical changes were seen in the left lower lobe.    Unfortunately, she had evidence of recurrence on CT chest in December of 2024, which revealed interval progression in the size and number of multiple bilateral small pulmonary nodules most consistent with progression of metastatic disease. We decided not to pursue a biopsy in view of the difficulty involved. Treatment options were discussed and she wished not to pursue chemotherapy unless absolutely necessary.  She was placed back on pembrolizumab  and had a repeat CT on 04/24/2023. CT chest describes progression of disease as the mediastinal and hilar nodes are mildly larger, the pulmonary nodules are minimally larger, and there is still a subpleural opacity which may be atelectasis or evolving scar tissue.  She also had a 3 mm tiny hypervascular focus in the anterior hepatic dome that was previously present and an 8 mm subcapsular low-density lesion medial right liver that is stable, but both are too small to characterize.  However, when I reviewed the images and measurements, the differences were minimal, so we continued the immunotherapy.  Now she has a repeat CT scan from 07/10/2023 which again shows very mild progression with some increased mediastinal and bilateral hilar lymphadenopathy but minimal increases.  She still has multiple bilateral pulmonary nodules but only up to 8 mm in size and so just mildly progressive.  She has a small left pleural effusion with mild pleural thickening.  I reviewed this information with her and once again we have opted to continue immunotherapy.  Our other alternative would be IV chemotherapy and she wishes to avoid that if possible.  I explained that eventually it probably will be necessary but I see no urgency at this time in view of the extremely slow progression of her disease.   Malignant neoplasm metastatic to lung [facility]) CT chest in December 2024 revealed multiple bilateral pulmonary nodules which increased in size and number since imaging in August 2024.  These measure up to 7 mm in diameter.  There was an area of pleural plaque in the posteriomedial left upper lobe, measuring 2.9 cm.  Prior PD-L1 was positive at 90%, so she resumed pembrolizumab  in January.  We don't have the radiologist's report on her current CT but I reviewed the images with her and her son, and the change in disease seems minimal.  Now she has a repeat CT scan from 07/10/2023 which again shows very mild progression with some increased mediastinal and bilateral hilar lymphadenopathy but minimal increases.  She still has multiple bilateral pulmonary nodules but only up to 8 mm in size and so just mildly progressive.  She has a small left pleural effusion with mild pleural thickening.  I reviewed this information with her and once again we have opted to continue immunotherapy.  Our other alternative would be IV chemotherapy and she wishes to avoid that if possible.  I explained that eventually it probably will be  necessary but I see no urgency at this time in view of the extremely slow progression of her disease.   Hypothyroidism due to medication The TSH has fluctuated up and down.  She is currently on levothyroxine  75 mcg daily.  Her last TSH was slightly elevated at 5.322.  TSH is pending from today.  .   Plan: She has now finished a Z-Pak for acute bronchitis and so we will observe her for now rather than order further antibiotics.  I think we can proceed with her Keytruda  infusion today.  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  I explained this diagnosis and the fact that she has 2 genes so her children would have inherited at least one.  This means we need to avoid irinotecan  as she would have excessive toxicities with this.  She has a elevated WBC of 12.1, hemoglobin and platelet count are normal. Her CMP is normal other than a mildly elevated total bilirubin of 1.7.  Now she has a repeat CT scan from 07/10/2023 which again shows very mild progression with some increased mediastinal and bilateral hilar lymphadenopathy but minimal increases.  She still has multiple bilateral pulmonary nodules but only up to 8 mm in size and so just mildly progressive.  She has a small left pleural effusion with mild pleural thickening.  I reviewed this information with her and once again we have opted to continue immunotherapy.  Our other alternative would be IV chemotherapy and she wishes to avoid that if possible.  I explained that eventually it probably will be necessary but I see no urgency at this time in view of the extremely slow progression of her disease.  I will see her back in 3 weeks with CBC, CMP, TSH, and T4.  The patient understands the plans discussed today and is in agreement with them.  She knows to contact our office if she develops concerns prior to her next  appointment.  I provided 35 minutes of face-to-face time during this encounter and > 50% was spent counseling as documented under my assessment and plan.   [name], MD  [facility] - A DEPT OF [facility] [address] [place] [address] Dept: [phone] Dept Fax: [phone]   No orders of the defined types were placed in this encounter.   CHIEF COMPLAINT:  CC: Recurrent metastatic adenocarcinoma of the lung  Current Treatment: Pembrolizumab  every 3 weeks  HISTORY OF PRESENT ILLNESS:   Oncology History  Non-small cell lung cancer, left (HCC)  01/15/2021 Initial Diagnosis   Non-small cell lung cancer, left (HCC)   02/12/2021 Cancer Staging   Staging form: Lung, AJCC 8th Edition - Clinical stage from 02/12/2021: Stage IA2 (cT1b, cN0, cM0) - Signed by Cornish Wanda VEAR, MD on 02/14/2021 Histopathologic type: Adenocarcinoma, NOS Stage prefix: Initial diagnosis Histologic grade (G): GX Histologic grading system: 4 grade system Laterality: Left Tumor size (mm): 19 Lymph-vascular invasion (LVI): LVI not present (absent)/not identified Diagnostic confirmation: Positive histology Specimen  type: Bronchial Biopsy Staged by: Managing physician Stage used in treatment planning: Yes National guidelines used in treatment planning: Yes Type of national guideline used in treatment planning: NCCN Staging comments: Rec surgical resection   05/11/2021 Cancer Staging   Staging form: Lung, AJCC 8th Edition - Pathologic stage from 05/11/2021: Stage IIB (ypT1c, pN1, cM0) - Signed by Cornelius Wanda DEL, MD on 11/12/2021 Histopathologic type: Adenocarcinoma, NOS Stage prefix: Post-therapy Histologic grade (G): G2 Histologic grading system: 4 grade system Residual tumor (R): R0 - None Laterality: Left Tumor size (mm): 25 Lymph-vascular invasion (LVI): LVI not present (absent)/not identified Diagnostic confirmation:  Positive histology PLUS positive immunophenotyping and/or positive genetic studies Specimen type: Excision Staged by: Managing physician Type of lung cancer: Surgically resected non-small cell lung cancer ECOG performance status: Grade 1 Perineural invasion (PNI): Absent Pleural/elastic layer invasion: PL0 Pleural lavage cytology: Unknown Weight loss: Absent Adequacy of mediastinal dissection: Adequate Radiotherapy dose: No Adjuvant radiation: No Adjuvant chemotherapy: Yes Stage used in treatment planning: Yes National guidelines used in treatment planning: Yes Type of national guideline used in treatment planning: NCCN Staging comments: Adjuvant pembrolizumab , PDL 1 90%   10/04/2021 - 10/01/2022 Chemotherapy   Patient is on Treatment Plan : LUNG NSCLC Pembrolizumab  Adj q21d x 18 cycles     02/04/2023 -  Chemotherapy   Patient is on Treatment Plan : LUNG NSCLC Pembrolizumab  (200) q21d       INTERVAL HISTORY:  Sydney Hunt is here today for repeat clinical for her recurrent metastatic adenocarcinoma of the lung. Patient states that she feels ok but complains of increased shortness of breath and cough especially over the last week, but admits she has started back smoking.  She has an appointment with neurology to evaluate her right foot drop, but it is not until September. I referred her to physical therapy for this and will order a MRI of the brain for further evaluation, which will be done in July.  A chest x-ray last time revealed bilateral interstitial opacities with mild worsening.  She has now finished a Z-Pak for acute bronchitis and so we will observe her for now rather than order further antibiotics.  I think we can proceed with her Keytruda  infusion today.  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  I explained this diagnosis and the fact that she has 2  genes so her children would have inherited at least one.  This means we need to avoid irinotecan  as she would have excessive toxicities with this.  She has a elevated WBC of 12.1, hemoglobin and platelet count are normal. Her CMP is normal other than a mildly elevated total bilirubin of 1.7.  Now she has a repeat CT scan from 07/10/2023 which again shows very mild progression with some increased mediastinal and bilateral hilar lymphadenopathy but minimal increases.  She still has multiple bilateral pulmonary nodules but only up to 8 mm in size and so just mildly progressive.  She has a small left pleural effusion with mild pleural thickening.  I reviewed this information with her and once again we have opted to continue immunotherapy.  Our other alternative would be IV chemotherapy and she wishes to avoid that if possible.  I explained that eventually it probably will be necessary but I see no urgency at this time in view of the extremely slow progression of her disease.  I will see her back in 3 weeks with CBC, CMP, TSH, and T4.   She denies fever, chills, night sweats, or other signs of infection. She denies gastrointestinal issues. Her appetite is good and Her weight remains stable.   REVIEW OF SYSTEMS:  Review of Systems  Constitutional: Negative.  Negative for appetite change, chills, diaphoresis, fatigue, fever and unexpected weight change.  HENT:   Positive for hearing loss (hearing aid). Negative for lump/mass, mouth sores, nosebleeds, sore throat, tinnitus, trouble swallowing and voice change.   Eyes:  Positive for eye problems (watery). Negative for icterus.  Respiratory:  Positive for cough (with congestion, clear sputum) and shortness of breath (increased). Negative for chest tightness, hemoptysis and wheezing.   Cardiovascular:  Negative for leg swelling and palpitations. Chest pain: in the center. Gastrointestinal:  Positive for nausea (after treatment). Negative for abdominal distention,  abdominal pain, blood in stool, constipation, diarrhea, rectal pain and vomiting.  Endocrine: Negative.  Negative for hot flashes.  Genitourinary: Negative.  Negative for bladder incontinence, difficulty urinating, dyspareunia, dysuria, frequency, hematuria, menstrual problem, nocturia, pelvic pain, vaginal bleeding and vaginal discharge.   Musculoskeletal: Negative.  Negative for arthralgias, back pain, flank pain, gait problem, myalgias, neck pain and neck stiffness.  Skin: Negative.  Negative for itching, rash and wound.  Neurological:  Negative for dizziness, extremity weakness, gait problem, headaches, light-headedness, numbness, seizures and speech difficulty.  Hematological: Negative.  Negative for adenopathy. Does not bruise/bleed easily.  Psychiatric/Behavioral: Negative.  Negative for confusion, decreased concentration, depression, sleep disturbance and suicidal ideas. The patient is not nervous/anxious.    VITALS:  Blood pressure (!) 156/80, pulse 66, temperature 98.2 F (36.8 C), temperature source Oral, resp. rate 20, height 4' 11 (1.499 m), weight 97 lb 1.6 oz (44 kg), SpO2 96%.  Wt Readings from Last 3 Encounters:  07/22/23 97 lb 1.6 oz (44 kg)  06/26/23 97 lb 3.2 oz (44.1 kg)  06/05/23 98 lb 11.2 oz (44.8 kg)    Body mass index is 19.61 kg/m.  Performance status (ECOG): 1 - Symptomatic but completely ambulatory  PHYSICAL EXAM:  Physical Exam Vitals and nursing note reviewed. Exam conducted with a chaperone present.  Constitutional:      General: She is not in acute distress.    Appearance: Normal appearance. She is normal weight. She is not ill-appearing, toxic-appearing or diaphoretic.  HENT:     Head: Normocephalic and atraumatic.     Right Ear: Tympanic membrane, ear canal and external ear normal. There is no impacted cerumen.     Left Ear: Tympanic membrane, ear canal and external ear normal. There is no impacted cerumen.     Nose: Nose normal. No congestion or  rhinorrhea.     Mouth/Throat:     Mouth: Mucous membranes are moist.     Pharynx: Oropharynx is clear. No oropharyngeal exudate or posterior oropharyngeal erythema.  Eyes:     General: No scleral icterus.       Right eye: No discharge.        Left eye: No discharge.     Extraocular Movements: Extraocular movements intact.     Conjunctiva/sclera: Conjunctivae normal.     Pupils: Pupils are equal, round, and reactive to light.  Neck:     Vascular: No carotid bruit.  Cardiovascular:     Rate and Rhythm: Normal rate and regular rhythm.     Pulses: Normal pulses.     Heart sounds: Normal heart sounds. No murmur heard.  No friction rub. No gallop.  Pulmonary:     Effort: Pulmonary effort is normal. No respiratory distress.     Breath sounds: No stridor. Examination of the left-upper field reveals wheezing. Examination of the right-lower field reveals rales. Examination of the left-lower field reveals rales. Wheezing (expiratory) and rales (L>R) present. No rhonchi.  Chest:     Chest wall: No tenderness.  Abdominal:     General: Bowel sounds are normal. There is no distension.     Palpations: Abdomen is soft. There is no hepatomegaly, splenomegaly or mass.     Tenderness: There is no abdominal tenderness. There is no right CVA tenderness, left CVA tenderness, guarding or rebound.     Hernia: No hernia is present.  Musculoskeletal:        General: No swelling, tenderness, deformity or signs of injury. Normal range of motion.     Cervical back: Normal range of motion and neck supple. No rigidity or tenderness.     Right lower leg: No edema.     Left lower leg: No edema.  Feet:     Right foot:     Skin integrity: No blister.     Left foot:     Skin integrity: No blister.  Lymphadenopathy:     Cervical: No cervical adenopathy.     Right cervical: No superficial, deep or posterior cervical adenopathy.    Left cervical: No superficial, deep or posterior cervical adenopathy.     Upper  Body:     Right upper body: No supraclavicular, axillary or pectoral adenopathy.     Left upper body: No supraclavicular, axillary or pectoral adenopathy.     Lower Body: No right inguinal adenopathy. No left inguinal adenopathy.  Skin:    General: Skin is warm and dry.     Coloration: Skin is not jaundiced or pale.     Findings: No bruising, erythema, lesion or rash.  Neurological:     General: No focal deficit present.     Mental Status: She is alert and oriented to person, place, and time. Mental status is at baseline.     Cranial Nerves: No cranial nerve deficit.     Sensory: No sensory deficit.     Motor: No weakness.     Coordination: Coordination normal.     Gait: Gait normal.     Deep Tendon Reflexes: Reflexes normal.  Psychiatric:        Mood and Affect: Mood normal.        Behavior: Behavior normal.        Thought Content: Thought content normal.        Judgment: Judgment normal.    LABS:      Latest Ref Rng & Units 07/22/2023   10:30 AM 06/26/2023    9:40 AM 06/05/2023    9:06 AM  CBC  WBC 4.0 - 10.5 K/uL 12.1  11.3  9.1   Hemoglobin 12.0 - 15.0 g/dL 85.4  84.2  85.5   Hematocrit 36.0 - 46.0 % 42.8  46.1  42.7   Platelets 150 - 400 K/uL 306  298  291       Latest Ref Rng & Units 07/22/2023   10:30 AM 06/26/2023    9:40 AM 06/05/2023    9:06 AM  CMP  Glucose 70 - 99 mg/dL 86  895  66   BUN 8 - 23 mg/dL 15  11  12    Creatinine 0.44 - 1.00 mg/dL 9.17  9.12  9.14  Sodium 135 - 145 mmol/L 141  145  140   Potassium 3.5 - 5.1 mmol/L 3.8  3.5  4.8   Chloride 98 - 111 mmol/L 106  106  106   CO2 22 - 32 mmol/L 27  26  26    Calcium 8.9 - 10.3 mg/dL 9.3  89.8  9.8   Total Protein 6.5 - 8.1 g/dL 7.0  7.4  6.9   Total Bilirubin 0.0 - 1.2 mg/dL 1.7  1.5  1.1   Alkaline Phos 38 - 126 U/L 74  112  57   AST 15 - 41 U/L 13  16  18    ALT 0 - 44 U/L 7  8  8     Lab Results  Component Value Date   CEA1 5.3 (H) 10/17/2022   CEA 4.2 01/13/2023   /  CEA  Date Value Ref Range  Status  01/13/2023 4.2  Final  10/17/2022 5.3 (H) 0.0 - 4.7 ng/mL Final    Comment:    (NOTE)                             Nonsmokers          <3.9                             Smokers             <5.6 [employer] Electrochemiluminescence Immunoassay (ECLIA) Values obtained with different assay methods or kits cannot be used interchangeably.  Results cannot be interpreted as absolute evidence of the presence or absence of malignant disease. Performed At: [facility] [address] [place], [place] [id number] Jennette Shorter MD Ey:[id number]    Lab Results  Component Value Date   TSH 15.449 (H) 07/22/2023   T4TOTAL 9.1 07/22/2023   Lab Results  Component Value Date   LDH 130 11/28/2022   STUDIES:  EXAM: 04/24/2023 CT CHEST WITH CONTRAST IMPRESSION: 1. Interval progression of mediastinal and hilar lymphadenopathy, highly suspicious for metastatic disease. 2. Bilateral pulmonary nodules have increased in size in the interval, also worrisome for metastatic progression. 3. Subpleural opacity in the posterior left lung is progressive in the interval and may be atelectatic or evolving scar. Attention on follow-up recommended. 4. Tiny hypervascular focus in the anterior hepatic dome is too small to characterize but present previously. While likely benign, attention on follow-up recommended. 5.  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 Report date is 12/12/2020.     The Multi-Cancer + RNA Panel offered by [facility] includes sequencing and/or deletion/duplication analysis of the following 84 genes:  AIP*, ALK, APC*, ATM*, AXIN2*, BAP1*, BARD1*, BLM*, BMPR1A*, BRCA1*, BRCA2*, BRIP1*, CASR, CDC73*, CDH1*, CDK4, CDKN1B*, CDKN1C*, CDKN2A, CEBPA, CHEK2*, CTNNA1*, DICER1*, DIS3L2*, EGFR, EPCAM, FH   GERD (gastroesophageal reflux disease)    Heartburn 12/27/2021  Hormone disorder 11/20/2020   Hyperbilirubinemia 09/26/2022   Hypertension    Hypokalemia 12/27/2021   Hypothyroidism due to medication 01/22/2022   Leukocytosis 01/17/2022   Migraines    Nodule of left lung 11/21/2020   Non-small cell lung cancer, left (HCC) 01/15/2021   Port-A-Cath in place 09/05/2022   Tobacco use 12/07/2020    Past Surgical History:  Procedure Laterality Date   ABDOMINAL HYSTERECTOMY  1989   unsure of vaginal or abdominal   BRONCHIAL BIOPSY  01/15/2021   Procedure: BRONCHIAL BIOPSIES;  Surgeon: Shelah Lamar RAMAN, MD;  Location: [facility] ENDOSCOPY;  Service: Pulmonary;;   BRONCHIAL BRUSHINGS  01/15/2021   Procedure: BRONCHIAL BRUSHINGS;  Surgeon: Shelah Lamar RAMAN, MD;  Location: [facility] ENDOSCOPY;  Service: Pulmonary;;   BRONCHIAL NEEDLE ASPIRATION BIOPSY  01/15/2021   Procedure: BRONCHIAL NEEDLE ASPIRATION BIOPSIES;  Surgeon: Shelah Lamar RAMAN, MD;  Location: [facility] ENDOSCOPY;  Service: Pulmonary;;   COLONOSCOPY  2022   FIDUCIAL MARKER PLACEMENT  01/15/2021   Procedure: FIDUCIAL MARKER PLACEMENT;  Surgeon: Shelah Lamar RAMAN, MD;  Location: [facility] ENDOSCOPY;  Service: Pulmonary;;   ovaries removed  1986   surgery to remove scar tissue     from colon/bladder in patient's late 30's   TUBAL LIGATION  1986   UPPER GI ENDOSCOPY     years ago in patient's late 71s   VIDEO BRONCHOSCOPY WITH RADIAL ENDOBRONCHIAL ULTRASOUND  01/15/2021   Procedure: VIDEO BRONCHOSCOPY WITH RADIAL ENDOBRONCHIAL ULTRASOUND;  Surgeon: Shelah Lamar RAMAN, MD;   Location: [facility] ENDOSCOPY;  Service: Pulmonary;;    Family History  Problem Relation Age of Onset   Pancreatic cancer Maternal Aunt 50   Brain cancer Maternal Uncle    Stomach cancer Maternal Uncle        dx. >50   Prostate cancer Half-Brother        passed away at 70   Prostate cancer Half-Brother     Social History:  reports that she quit smoking about 2 years ago. Her smoking use included cigarettes. She started smoking about 62 years ago. She has a 30.2 pack-year smoking history. She has never used smokeless tobacco. She reports current alcohol use of about 4.0 standard drinks of alcohol per week. She reports current drug use. Frequency: 3.00 times per week. Drug: Marijuana.The patient is alone today.  Allergies:  Allergies  Allergen Reactions   Codeine Rash   Demerol [Meperidine Hcl] Other (See Comments)    coma   Meperidine Other (See Comments)    Several days will pass without patient being aware  Several days will pass without patient being aware    coma   Other Dermatitis    Stainless steel / skin weeps   Silver Dermatitis    Sterling silver skin weeps   Gold Bond [Menthol-Zinc Oxide] Dermatitis    Skin weeps   Neosporin [Bacitracin-Polymyxin B] Dermatitis    Skin weeps   Wound Dressing Adhesive Other (See Comments)    CAUSES SKIN TEARS PER PATIENT. INSTEAD USE THE TEGADERM 81M WITH ADHESIVE FREE WINDOW OVER PORT.   Wound Dressings Other (See Comments)    CAUSES SKIN TEARS PER PATIENT. INSTEAD USE THE TEGADERM 81M WITH ADHESIVE FREE WINDOW OVER PORT.    Current Medications: Current Outpatient Medications  Medication Sig Dispense Refill   aspirin 81 MG EC tablet Take 81 mg by mouth daily.     atorvastatin (LIPITOR) 40 MG tablet Take 40 mg by mouth daily.     azithromycin  (ZITHROMAX  Z-PAK) 250 MG tablet 2  pills today, then 1 pill daily 6 each 0   celecoxib (CELEBREX) 200 MG capsule Take 200 mg by mouth daily.     HYDROcodone -acetaminophen  (NORCO/VICODIN) 5-325 MG  tablet Take 1 tablet by mouth every 6 (six) hours as needed for moderate pain (pain score 4-6) or severe pain (pain score 7-10). 30 tablet 0   levothyroxine  (SYNTHROID ) 75 MCG tablet Take 1 tablet (75 mcg total) by mouth daily. 30 tablet 5   lidocaine -prilocaine  (EMLA ) cream Apply 1 Application topically as needed. 30 g 0   LORazepam  (ATIVAN ) 1 MG tablet Take 1 tablet (1 mg total) by mouth every 8 (eight) hours. Use 1-2 tablets by mouth prior to MRI scan 30 tablet 0   ondansetron  (ZOFRAN ) 4 MG tablet Take 1 tablet (4 mg total) by mouth every 4 (four) hours as needed for nausea. 90 tablet 3   pantoprazole  (PROTONIX ) 40 MG tablet TAKE 1 TABLET(40 MG) BY MOUTH DAILY 60 tablet 1   Prenatal Vit-Fe Fumarate-FA (PRENATAL VITAMIN) 27-0.8 MG TABS Take 1 tablet by mouth daily. 30 tablet 5   prochlorperazine  (COMPAZINE ) 10 MG tablet TAKE 1 TABLET(10 MG) BY MOUTH EVERY 6 HOURS AS NEEDED FOR NAUSEA OR VOMITING 360 tablet 0   VENTOLIN  HFA 108 (90 Base) MCG/ACT inhaler Inhale into the lungs.     No current facility-administered medications for this visit.    I,Jasmine M Lassiter,acting as a scribe for [name], MD.,have documented all relevant documentation on the behalf of [name], MD,as directed by  [name], MD while in the presence of [name], MD.

## 2023-07-22 ENCOUNTER — Encounter: Payer: Self-pay | Admitting: Oncology

## 2023-07-22 ENCOUNTER — Inpatient Hospital Stay

## 2023-07-22 ENCOUNTER — Inpatient Hospital Stay: Admitting: Oncology

## 2023-07-22 ENCOUNTER — Other Ambulatory Visit: Payer: Self-pay | Admitting: Oncology

## 2023-07-22 VITALS — BP 156/80 | HR 66 | Temp 98.2°F | Resp 20 | Ht 59.0 in | Wt 97.1 lb

## 2023-07-22 DIAGNOSIS — C3492 Malignant neoplasm of unspecified part of left bronchus or lung: Secondary | ICD-10-CM

## 2023-07-22 DIAGNOSIS — C7801 Secondary malignant neoplasm of right lung: Secondary | ICD-10-CM | POA: Diagnosis not present

## 2023-07-22 DIAGNOSIS — R17 Unspecified jaundice: Secondary | ICD-10-CM

## 2023-07-22 DIAGNOSIS — C7802 Secondary malignant neoplasm of left lung: Secondary | ICD-10-CM

## 2023-07-22 DIAGNOSIS — Z5112 Encounter for antineoplastic immunotherapy: Secondary | ICD-10-CM | POA: Diagnosis not present

## 2023-07-22 LAB — CMP (CANCER CENTER ONLY)
ALT: 7 U/L (ref 0–44)
AST: 13 U/L — ABNORMAL LOW (ref 15–41)
Albumin: 3.9 g/dL (ref 3.5–5.0)
Alkaline Phosphatase: 74 U/L (ref 38–126)
Anion gap: 8 (ref 5–15)
BUN: 15 mg/dL (ref 8–23)
CO2: 27 mmol/L (ref 22–32)
Calcium: 9.3 mg/dL (ref 8.9–10.3)
Chloride: 106 mmol/L (ref 98–111)
Creatinine: 0.82 mg/dL (ref 0.44–1.00)
GFR, Estimated: >60 mL/min (ref >60)
Glucose, Bld: 86 mg/dL (ref 70–99)
Potassium: 3.8 mmol/L (ref 3.5–5.1)
Sodium: 141 mmol/L (ref 135–145)
Total Bilirubin: 1.7 mg/dL — ABNORMAL HIGH (ref 0.0–1.2)
Total Protein: 7 g/dL (ref 6.5–8.1)

## 2023-07-22 LAB — CBC WITH DIFFERENTIAL (CANCER CENTER ONLY)
Abs Immature Granulocytes: 0.01 10*3/uL (ref 0.00–0.07)
Basophils Absolute: 0.3 10*3/uL — ABNORMAL HIGH (ref 0.0–0.1)
Basophils Relative: 3 %
Eosinophils Absolute: 4.7 10*3/uL — ABNORMAL HIGH (ref 0.0–0.5)
Eosinophils Relative: 38 %
HCT: 42.8 % (ref 36.0–46.0)
Hemoglobin: 14.5 g/dL (ref 12.0–15.0)
Immature Granulocytes: 0 %
Lymphocytes Relative: 27 %
Lymphs Abs: 3.2 10*3/uL (ref 0.7–4.0)
MCH: 32.4 pg (ref 26.0–34.0)
MCHC: 33.9 g/dL (ref 30.0–36.0)
MCV: 95.5 fL (ref 80.0–100.0)
Monocytes Absolute: 0.8 10*3/uL (ref 0.1–1.0)
Monocytes Relative: 7 %
Neutro Abs: 3.1 10*3/uL (ref 1.7–7.7)
Neutrophils Relative %: 25 %
Platelet Count: 306 10*3/uL (ref 150–400)
RBC: 4.48 MIL/uL (ref 3.87–5.11)
RDW: 13.1 % (ref 11.5–15.5)
WBC Count: 12.1 10*3/uL — ABNORMAL HIGH (ref 4.0–10.5)
nRBC: 0 % (ref 0.0–0.2)

## 2023-07-22 LAB — TSH: TSH: 15.449 u[IU]/mL — ABNORMAL HIGH (ref 0.350–4.500)

## 2023-07-22 MED ORDER — SODIUM CHLORIDE 0.9 % IV SOLN
200.0000 mg | Freq: Once | INTRAVENOUS | Status: AC
  Administered 2023-07-22: 200 mg via INTRAVENOUS
  Filled 2023-07-22: qty 8

## 2023-07-22 MED ORDER — SODIUM CHLORIDE 0.9 % IV SOLN: INTRAVENOUS | Status: DC

## 2023-07-22 MED ORDER — HEPARIN SOD (PORK) LOCK FLUSH 100 UNIT/ML IV SOLN
500.0000 [IU] | Freq: Once | INTRAVENOUS | Status: AC | PRN
  Administered 2023-07-22: 500 [IU]

## 2023-07-22 MED ORDER — SODIUM CHLORIDE 0.9% FLUSH
10.0000 mL | INTRAVENOUS | Status: DC | PRN
  Administered 2023-07-22: 10 mL

## 2023-07-22 NOTE — Patient Instructions (Signed)
 Pembrolizumab Injection What is this medication? PEMBROLIZUMAB (PEM broe LIZ ue mab) treats some types of cancer. It works by helping your immune system slow or stop the spread of cancer cells. It is a monoclonal antibody. This medicine may be used for other purposes; ask your health care provider or pharmacist if you have questions. COMMON BRAND NAME(S): Keytruda What should I tell my care team before I take this medication? They need to know if you have any of these conditions: Allogeneic stem cell transplant (uses someone else's stem cells) Autoimmune diseases, such as Crohn disease, ulcerative colitis, lupus History of chest radiation Nervous system problems, such as Guillain-Barre syndrome, myasthenia gravis Organ transplant An unusual or allergic reaction to pembrolizumab, other medications, foods, dyes, or preservatives Pregnant or trying to get pregnant Breast-feeding How should I use this medication? This medication is injected into a vein. It is given by your care team in a hospital or clinic setting. A special MedGuide will be given to you before each treatment. Be sure to read this information carefully each time. Talk to your care team about the use of this medication in children. While it may be prescribed for children as young as 6 months for selected conditions, precautions do apply. Overdosage: If you think you have taken too much of this medicine contact a poison control center or emergency room at once. NOTE: This medicine is only for you. Do not share this medicine with others. What if I miss a dose? Keep appointments for follow-up doses. It is important not to miss your dose. Call your care team if you are unable to keep an appointment. What may interact with this medication? Interactions have not been studied. This list may not describe all possible interactions. Give your health care provider a list of all the medicines, herbs, non-prescription drugs, or dietary  supplements you use. Also tell them if you smoke, drink alcohol, or use illegal drugs. Some items may interact with your medicine. What should I watch for while using this medication? Your condition will be monitored carefully while you are receiving this medication. You may need blood work while taking this medication. This medication may cause serious skin reactions. They can happen weeks to months after starting the medication. Contact your care team right away if you notice fevers or flu-like symptoms with a rash. The rash may be red or purple and then turn into blisters or peeling of the skin. You may also notice a red rash with swelling of the face, lips, or lymph nodes in your neck or under your arms. Tell your care team right away if you have any change in your eyesight. Talk to your care team if you may be pregnant. Serious birth defects can occur if you take this medication during pregnancy and for 4 months after the last dose. You will need a negative pregnancy test before starting this medication. Contraception is recommended while taking this medication and for 4 months after the last dose. Your care team can help you find the option that works for you. Do not breastfeed while taking this medication and for 4 months after the last dose. What side effects may I notice from receiving this medication? Side effects that you should report to your care team as soon as possible: Allergic reactions--skin rash, itching, hives, swelling of the face, lips, tongue, or throat Dry cough, shortness of breath or trouble breathing Eye pain, redness, irritation, or discharge with blurry or decreased vision Heart muscle inflammation--unusual weakness or  fatigue, shortness of breath, chest pain, fast or irregular heartbeat, dizziness, swelling of the ankles, feet, or hands Hormone gland problems--headache, sensitivity to light, unusual weakness or fatigue, dizziness, fast or irregular heartbeat, increased  sensitivity to cold or heat, excessive sweating, constipation, hair loss, increased thirst or amount of urine, tremors or shaking, irritability Infusion reactions--chest pain, shortness of breath or trouble breathing, feeling faint or lightheaded Kidney injury (glomerulonephritis)--decrease in the amount of urine, red or dark brown urine, foamy or bubbly urine, swelling of the ankles, hands, or feet Liver injury--right upper belly pain, loss of appetite, nausea, light-colored stool, dark yellow or brown urine, yellowing skin or eyes, unusual weakness or fatigue Pain, tingling, or numbness in the hands or feet, muscle weakness, change in vision, confusion or trouble speaking, loss of balance or coordination, trouble walking, seizures Rash, fever, and swollen lymph nodes Redness, blistering, peeling, or loosening of the skin, including inside the mouth Sudden or severe stomach pain, bloody diarrhea, fever, nausea, vomiting Side effects that usually do not require medical attention (report to your care team if they continue or are bothersome): Bone, joint, or muscle pain Diarrhea Fatigue Loss of appetite Nausea Skin rash This list may not describe all possible side effects. Call your doctor for medical advice about side effects. You may report side effects to FDA at 1-800-FDA-1088. Where should I keep my medication? This medication is given in a hospital or clinic. It will not be stored at home. NOTE: This sheet is a summary. It may not cover all possible information. If you have questions about this medicine, talk to your doctor, pharmacist, or health care provider.  2024 Elsevier/Gold Standard (2021-05-29 00:00:00)  [facility] [place] [facility]  Discharge Instructions: Thank you for choosing [facility] to provide your oncology and hematology care.  If you have a lab appointment with the Cancer Center, please go directly to the Cancer Center  and check in at the registration area.   Wear comfortable clothing and clothing appropriate for easy access to any Portacath or PICC line.   We strive to give you quality time with your provider. You may need to reschedule your appointment if you arrive late (15 or more minutes).  Arriving late affects you and other patients whose appointments are after yours.  Also, if you miss three or more appointments without notifying the office, you may be dismissed from the clinic at the provider's discretion.      For prescription refill requests, have your pharmacy contact our office and allow 72 hours for refills to be completed.    Today you received the following chemotherapy and/or immunotherapy agents PEMBROLIZUMAB      To help prevent nausea and vomiting after your treatment, we encourage you to take your nausea medication as directed.  BELOW ARE SYMPTOMS THAT SHOULD BE REPORTED IMMEDIATELY: *FEVER GREATER THAN 100.4 F (38 C) OR HIGHER *CHILLS OR SWEATING *NAUSEA AND VOMITING THAT IS NOT CONTROLLED WITH YOUR NAUSEA MEDICATION *UNUSUAL SHORTNESS OF BREATH *UNUSUAL BRUISING OR BLEEDING *URINARY PROBLEMS (pain or burning when urinating, or frequent urination) *BOWEL PROBLEMS (unusual diarrhea, constipation, pain near the anus) TENDERNESS IN MOUTH AND THROAT WITH OR WITHOUT PRESENCE OF ULCERS (sore throat, sores in mouth, or a toothache) UNUSUAL RASH, SWELLING OR PAIN  UNUSUAL VAGINAL DISCHARGE OR ITCHING   Items with * indicate a potential emergency and should be followed up as soon as possible or go to the Emergency Department  if any problems should occur.  Please show the CHEMOTHERAPY ALERT CARD or IMMUNOTHERAPY ALERT CARD at check-in to the Emergency Department and triage nurse.  Should you have questions after your visit or need to cancel or reschedule your appointment, please contact [facility] [place] [facility]  Dept: [phone]  and follow the  prompts.  Office hours are 8:00 a.m. to 4:30 p.m. Monday - Friday. Please note that voicemails left after 4:00 p.m. may not be returned until the following business day.  We are closed weekends and major holidays. You have access to a nurse at all times for urgent questions. Please call the main number to the clinic Dept: [phone] and follow the prompts.  For any non-urgent questions, you may also contact your provider using MyChart. We now offer e-Visits for anyone 78 and older to request care online for non-urgent symptoms. For details visit [email or web address].   Also download the MyChart app! Go to the app store, search "MyChart", open the app, select [facility], and log in with your MyChart username and password.

## 2023-07-22 NOTE — Progress Notes (Signed)
 1035 OKAY TO PROCEED WITH TREATMENT TODAY WITHOUT THE LABS PER DR. MCCARTY. LABS WERE DRAWN BUT WE DO NOT HAVE TO WAIT FOR RESULTS. GINNA, PHARMD AWARE OF THE ABOVE.

## 2023-07-23 LAB — T4: T4, Total: 9.1 ug/dL (ref 4.5–12.0)

## 2023-08-07 ENCOUNTER — Encounter: Payer: Self-pay | Admitting: Oncology

## 2023-08-08 ENCOUNTER — Other Ambulatory Visit: Payer: Self-pay | Admitting: Oncology

## 2023-08-08 DIAGNOSIS — T50905A Adverse effect of unspecified drugs, medicaments and biological substances, initial encounter: Secondary | ICD-10-CM

## 2023-08-08 NOTE — Progress Notes (Signed)
 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  She was treated with left lower lobectomy in [place] .  Her doctor there recommended chemotherapy with immunotherapy, but she refused chemotherapy.  PD-L1 was positive.  She was only willing to take the immunotherapy for 1 year. She started pembrolizumab  in August 2023.  We saw her in October 2023, when she returned to [place]  and continued pembrolizumab  every 21 days. Routine CT imaging had not shown any evidence of recurrence.  She completed 17 cycles of adjuvant pembrolizumab  in August and CT chest following that was negative for recurrence. There were stable scattered small bilateral pulmonary nodules, felt to be from benign emphysema.  Surgical changes were seen in the left lower lobe.    Unfortunately, she had evidence of recurrence on CT chest in December of 2024, which revealed interval progression in the size and number of multiple bilateral small pulmonary nodules most consistent with progression of metastatic disease. We decided not to pursue a biopsy in view of the difficulty involved. Treatment options were discussed and she wished not to pursue chemotherapy unless absolutely necessary.  She was placed back on pembrolizumab  and had a repeat CT on 04/24/2023. CT chest describes progression of disease as the mediastinal and hilar nodes are mildly larger, the pulmonary nodules are minimally larger, and there is still a subpleural opacity which may be atelectasis or evolving scar tissue.  She also had a 3 mm tiny hypervascular focus in the anterior hepatic dome that was previously present and an 8 mm subcapsular low-density lesion medial right liver that is stable, but both are too small to characterize.  However, when I reviewed the images and measurements, the differences were minimal, so we continued the immunotherapy.  Now she has a repeat CT scan from 07/10/2023 which again shows very mild progression with some increased mediastinal and bilateral hilar lymphadenopathy but minimal increases.  She still has multiple bilateral pulmonary nodules but only up to 8 mm in size and so just mildly progressive.  She has a small left pleural effusion with mild pleural thickening.  I reviewed this information with her and once again we have opted to continue immunotherapy.  Our other alternative would be IV chemotherapy and she wishes to avoid that if possible.  I explained that eventually it probably will be necessary but I see no urgency at this time in view of the extremely slow progression of her disease.   Malignant neoplasm metastatic to lung [facility]) CT chest in December 2024 revealed multiple bilateral pulmonary nodules which increased in size and number since imaging in August 2024.  These measure up to 7 mm in diameter.  There was an area of pleural plaque in the posteriomedial left upper lobe, measuring 2.9 cm.  Prior PD-L1 was positive at 90%, so she resumed pembrolizumab  in January.  We don't have the radiologist's report on her current CT but I reviewed the images with her and her son, and the change in disease seems minimal.  Now she has a repeat CT scan from 07/10/2023 which again shows very mild progression with some increased mediastinal and bilateral hilar lymphadenopathy but minimal increases.  She still has multiple bilateral pulmonary nodules but only up to 8 mm in size and so just mildly progressive.  She has a small left pleural effusion with mild pleural thickening.  I reviewed this information with her and once again we have opted to continue immunotherapy.  Our other alternative would be IV chemotherapy and she wishes to avoid that if possible.  I explained that eventually it probably will be  necessary but I see no urgency at this time in view of the extremely slow progression of her disease.   Hypothyroidism due to medication The TSH has fluctuated up and down.  She is currently on levothyroxine  75 mcg daily.  Her last TSH was slightly elevated at 5.322.  TSH is pending from today.   Plan: I once again discussed her diagnosis of Gilbert's syndrome and reviewed this with her and her brother today. She informed me that she has an upcoming appointment with her cardiologist on 08/20/2023. She is scheduled to have her MRI of the brain done today and I will look for that report. Her day 1 cycle 10 of Keytruda  is scheduled on 08/15/2023. She has a WBC of 8.9, hemoglobin of 14.4, and platelet count of 263,000. Her eosinophil is elevated at 1.4 but improved from 4.7. This is typically high and fluctuates. Her CMP is normal other than a elevated total bilirubin of 1.8. I will see her back in 3 weeks with CBC and CMP. The patient understands the plans discussed today and is in agreement with them.  She knows to contact our office if she develops concerns prior to her next appointment.  I provided 15 minutes of face-to-face time during this encounter and > 50% was spent counseling as documented under my assessment and plan.   [name], MD  [facility] OF [facility] [address] [place] [address] Dept: [phone] Dept Fax: [phone]   No orders of the defined types were placed in this encounter.   CHIEF COMPLAINT:  CC: Recurrent metastatic adenocarcinoma of the lung  Current Treatment: Pembrolizumab  every 3 weeks  HISTORY OF PRESENT ILLNESS:   Oncology History  Non-small cell lung cancer, left (HCC)  01/15/2021 Initial Diagnosis   Non-small cell lung cancer, left (HCC)   02/12/2021 Cancer Staging   Staging form: Lung, AJCC 8th Edition - Clinical stage from 02/12/2021: Stage IA2 (cT1b, cN0, cM0) -  Signed by Cornish Wanda VEAR, MD on 02/14/2021 Histopathologic type: Adenocarcinoma, NOS Stage prefix: Initial diagnosis Histologic grade (G): GX Histologic grading system: 4 grade system Laterality: Left Tumor size (mm): 19 Lymph-vascular invasion (LVI): LVI not present (absent)/not identified Diagnostic confirmation: Positive histology Specimen type: Bronchial Biopsy Staged by: Managing physician Stage used in treatment planning: Yes National guidelines used in treatment planning: Yes Type of national guideline used in treatment planning: NCCN Staging comments: Rec surgical resection   05/11/2021 Cancer Staging   Staging form: Lung, AJCC 8th Edition - Pathologic stage from 05/11/2021: Stage IIB (ypT1c, pN1, cM0) - Signed by Cornish Wanda VEAR, MD on 11/12/2021 Histopathologic type: Adenocarcinoma, NOS Stage prefix: Post-therapy Histologic grade (G): G2 Histologic grading system: 4 grade system Residual tumor (R): R0 - None Laterality: Left Tumor size (mm): 25 Lymph-vascular invasion (LVI): LVI not present (absent)/not identified Diagnostic confirmation: Positive histology PLUS positive immunophenotyping and/or positive genetic studies Specimen type: Excision Staged by: Managing physician Type of lung cancer: Surgically resected non-small cell lung cancer ECOG performance status: Grade 1 Perineural invasion (PNI): Absent Pleural/elastic layer invasion: PL0 Pleural lavage cytology: Unknown Weight loss: Absent Adequacy of mediastinal  dissection: Adequate Radiotherapy dose: No Adjuvant radiation: No Adjuvant chemotherapy: Yes Stage used in treatment planning: Yes National guidelines used in treatment planning: Yes Type of national guideline used in treatment planning: NCCN Staging comments: Adjuvant pembrolizumab , PDL 1 90%   10/04/2021 - 10/01/2022 Chemotherapy   Patient is on Treatment Plan : LUNG NSCLC Pembrolizumab  Adj q21d x 18 cycles     02/04/2023 -  Chemotherapy    Patient is on Treatment Plan : LUNG NSCLC Pembrolizumab  (200) q21d       INTERVAL HISTORY:  Othella is here today for repeat clinical for her recurrent metastatic adenocarcinoma of the lung, with a high PDL-1 score. Repeat CT scan from 07/10/2023 which again shows very mild progression with some increased mediastinal and bilateral hilar lymphadenopathy but minimal increases.  She still has multiple bilateral pulmonary nodules but only up to 8 mm in size and so just mildly progressive.  She has a small left pleural effusion with mild pleural thickening.  I reviewed this information with her and once again we have opted to continue immunotherapy. She had taken sedatives in order to tolerate the MRI scan so is somewhat lethargic. I once again discussed her diagnosis of Gilbert's syndrome and reviewed this with her and her brother today.  Patient states that she feels ok but complains of chronic cough with clear phlegm, shortness of breath, and fatigue. She informed me that she has an upcoming appointment with her cardiologist on 08/20/2023. She is scheduled to have her MRI of the brain done today and I will look for that report. Her day 1 cycle 10 of Keytruda  is scheduled on 08/15/2023. She has a WBC of 8.9, hemoglobin of 14.4, and platelet count of 263,000. Her eosinophil is elevated at 1.4 but improved from 4.7. This is typically high and fluctuates. Her CMP is normal other than a elevated total bilirubin of 1.8. I will see her back in 3 weeks with CBC and CMP.  She denies fever, chills, night sweats, or other signs of infection. She denies gastrointestinal issues. She  denies pain. Her appetite is good and Her weight has decreased 1 pounds over last 3 weeks. This patient is accompanied in the office by her brother.    REVIEW OF SYSTEMS:  Review of Systems  Constitutional:  Positive for fatigue. Negative for appetite change, chills, diaphoresis, fever and unexpected weight change.  HENT:   Positive for hearing  loss (hearing aid). Negative for lump/mass, mouth sores, nosebleeds, sore throat, tinnitus, trouble swallowing and voice change.   Eyes:  Positive for eye problems (watery). Negative for icterus.  Respiratory:  Positive for cough (with congestion, clear sputum) and shortness of breath (increased). Negative for chest tightness, hemoptysis and wheezing.   Cardiovascular:  Negative for chest pain, leg swelling and palpitations.  Gastrointestinal:  Positive for nausea (after treatment). Negative for abdominal distention, abdominal pain, blood in stool, constipation, diarrhea, rectal pain and vomiting.  Endocrine: Negative.  Negative for hot flashes.  Genitourinary: Negative.  Negative for bladder incontinence, difficulty urinating, dyspareunia, dysuria, frequency, hematuria, menstrual problem, nocturia, pelvic pain, vaginal bleeding and vaginal discharge.   Musculoskeletal: Negative.  Negative for arthralgias, back pain, flank pain, gait problem, myalgias, neck pain and neck stiffness.  Skin: Negative.  Negative for itching, rash and wound.  Neurological:  Negative for dizziness, extremity weakness, gait problem, headaches, light-headedness, numbness, seizures and speech difficulty.  Hematological: Negative.  Negative for adenopathy. Does not bruise/bleed easily.  Psychiatric/Behavioral: Negative.  Negative for confusion, decreased concentration,  depression, sleep disturbance and suicidal ideas. The patient is not nervous/anxious.    VITALS:  Blood pressure (!) 138/92, pulse 73, temperature 97.8 F (36.6 C), temperature source Oral, resp. rate 18, height 4' 11 (1.499 m), weight 96 lb 11.2 oz (43.9 kg), SpO2 96%.  Wt Readings from Last 3 Encounters:  08/20/23 93 lb 9.6 oz (42.5 kg)  08/14/23 96 lb 11.2 oz (43.9 kg)  07/22/23 97 lb 1.6 oz (44 kg)    Body mass index is 19.53 kg/m.  Performance status (ECOG): 1 - Symptomatic but completely ambulatory  PHYSICAL EXAM:  Physical Exam Vitals and  nursing note reviewed. Exam conducted with a chaperone present.  Constitutional:      General: She is not in acute distress.    Appearance: Normal appearance. She is normal weight. She is not ill-appearing, toxic-appearing or diaphoretic.  HENT:     Head: Normocephalic and atraumatic.     Right Ear: Tympanic membrane, ear canal and external ear normal. There is no impacted cerumen.     Left Ear: Tympanic membrane, ear canal and external ear normal. There is no impacted cerumen.     Nose: Nose normal. No congestion or rhinorrhea.     Mouth/Throat:     Mouth: Mucous membranes are moist.     Pharynx: Oropharynx is clear. No oropharyngeal exudate or posterior oropharyngeal erythema.  Eyes:     General: No scleral icterus.       Right eye: No discharge.        Left eye: No discharge.     Extraocular Movements: Extraocular movements intact.     Conjunctiva/sclera: Conjunctivae normal.     Pupils: Pupils are equal, round, and reactive to light.  Neck:     Vascular: No carotid bruit.  Cardiovascular:     Rate and Rhythm: Normal rate and regular rhythm.     Pulses: Normal pulses.     Heart sounds: Normal heart sounds. No murmur heard.    No friction rub. No gallop.  Pulmonary:     Effort: Pulmonary effort is normal. No respiratory distress.     Breath sounds: No stridor. Examination of the right-upper field reveals wheezing. Examination of the left-upper field reveals wheezing. Examination of the right-middle field reveals wheezing. Examination of the left-middle field reveals wheezing. Examination of the left-lower field reveals wheezing. Wheezing (mild) present. No rhonchi or rales.  Chest:     Chest wall: No tenderness.  Abdominal:     General: Bowel sounds are normal. There is no distension.     Palpations: Abdomen is soft. There is no hepatomegaly, splenomegaly or mass.     Tenderness: There is no abdominal tenderness. There is no right CVA tenderness, left CVA tenderness, guarding or  rebound.     Hernia: No hernia is present.  Musculoskeletal:        General: No swelling, tenderness, deformity or signs of injury. Normal range of motion.     Cervical back: Normal range of motion and neck supple. No rigidity or tenderness.     Right lower leg: No edema.     Left lower leg: No edema.  Feet:     Right foot:     Skin integrity: No blister.     Left foot:     Skin integrity: No blister.  Lymphadenopathy:     Cervical: No cervical adenopathy.     Right cervical: No superficial, deep or posterior cervical adenopathy.    Left cervical: No superficial, deep or  posterior cervical adenopathy.     Upper Body:     Right upper body: No supraclavicular, axillary or pectoral adenopathy.     Left upper body: No supraclavicular, axillary or pectoral adenopathy.     Lower Body: No right inguinal adenopathy. No left inguinal adenopathy.  Skin:    General: Skin is warm and dry.     Coloration: Skin is not jaundiced or pale.     Findings: No bruising, erythema, lesion or rash.  Neurological:     General: No focal deficit present.     Mental Status: She is alert and oriented to person, place, and time. Mental status is at baseline.     Cranial Nerves: No cranial nerve deficit.     Sensory: No sensory deficit.     Motor: No weakness.     Coordination: Coordination normal.     Gait: Gait normal.     Deep Tendon Reflexes: Reflexes normal.  Psychiatric:        Mood and Affect: Mood normal.        Behavior: Behavior normal.        Thought Content: Thought content normal.        Judgment: Judgment normal.    LABS:      Latest Ref Rng & Units 08/14/2023    9:07 AM 07/22/2023   10:30 AM 06/26/2023    9:40 AM  CBC  WBC 4.0 - 10.5 K/uL 8.9  12.1  11.3   Hemoglobin 12.0 - 15.0 g/dL 85.5  85.4  84.2   Hematocrit 36.0 - 46.0 % 42.6  42.8  46.1   Platelets 150 - 400 K/uL 263  306  298       Latest Ref Rng & Units 08/14/2023    9:07 AM 07/22/2023   10:30 AM 06/26/2023    9:40 AM   CMP  Glucose 70 - 99 mg/dL 895  86  895   BUN 8 - 23 mg/dL 18  15  11    Creatinine 0.44 - 1.00 mg/dL 9.09  9.17  9.12   Sodium 135 - 145 mmol/L 140  141  145   Potassium 3.5 - 5.1 mmol/L 4.2  3.8  3.5   Chloride 98 - 111 mmol/L 103  106  106   CO2 22 - 32 mmol/L 26  27  26    Calcium 8.9 - 10.3 mg/dL 9.6  9.3  89.8   Total Protein 6.5 - 8.1 g/dL 7.7  7.0  7.4   Total Bilirubin 0.0 - 1.2 mg/dL 1.8  1.7  1.5   Alkaline Phos 38 - 126 U/L 79  74  112   AST 15 - 41 U/L 17  13  16    ALT 0 - 44 U/L 10  7  8     Lab Results  Component Value Date   CEA1 5.3 (H) 10/17/2022   CEA 4.2 01/13/2023   /  CEA  Date Value Ref Range Status  01/13/2023 4.2  Final  10/17/2022 5.3 (H) 0.0 - 4.7 ng/mL Final    Comment:    (NOTE)                             Nonsmokers          <3.9                             Smokers             <  5.6 [employer] Electrochemiluminescence Immunoassay (ECLIA) Values obtained with different assay methods or kits cannot be used interchangeably.  Results cannot be interpreted as absolute evidence of the presence or absence of malignant disease. Performed At: [facility] [address] [place], [place] [id number] Jennette Shorter MD Ey:[id number]    Lab Results  Component Value Date   TSH 15.449 (H) 07/22/2023   T4TOTAL 9.1 07/22/2023   Lab Results  Component Value Date   LDH 130 11/28/2022   STUDIES:   EXAM: 07/10/2023  CT CHEST WITH CONTRAST IMPRESSION: 1. Status post right lower lobectomy. 2. Progressive thoracic nodal metastases, as above. 3. Multiple bilateral pulmonary nodules, mildly progressive, suggesting pulmonary metastases. 4. Small left pleural effusion with mild pleural thickening. Pleural malignancy is possible.  EXAM: 06/26/2023 CHEST - 2 VIEW IMPRESSION: Interval worsening of BILATERAL interstitial opacities which may be due to pulmonary vascular congestion or lymphangitic spread of malignancy.  EXAM: 04/24/2023 CT  CHEST WITH CONTRAST IMPRESSION: 1. Interval progression of mediastinal and hilar lymphadenopathy, highly suspicious for metastatic disease. 2. Bilateral pulmonary nodules have increased in size in the interval, also worrisome for metastatic progression. 3. Subpleural opacity in the posterior left lung is progressive in the interval and may be atelectatic or evolving scar. Attention on follow-up recommended. 4. Tiny hypervascular focus in the anterior hepatic dome is too small to characterize but present previously. While likely benign, attention on follow-up recommended. 5.  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 Report date is 12/12/2020.     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   Leukocytosis 01/17/2022   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) 02/11/2023   Multiple  bilateral pulmonary nodules up to 7 mm in diameter, increased in size and number from prior study of August 2024     Migraines    Nodule of left lung 11/21/2020   Non-small cell lung cancer, left (HCC) 01/15/2021   Port-A-Cath in place 09/05/2022   Tobacco use 12/07/2020    Past Surgical History:  Procedure Laterality Date   ABDOMINAL HYSTERECTOMY  1989   unsure of vaginal or abdominal   BRONCHIAL BIOPSY  01/15/2021   Procedure: BRONCHIAL BIOPSIES;  Surgeon: Shelah Lamar RAMAN, MD;  Location: [facility] ENDOSCOPY;  Service: Pulmonary;;   BRONCHIAL BRUSHINGS  01/15/2021   Procedure: BRONCHIAL BRUSHINGS;  Surgeon: Shelah Lamar RAMAN, MD;  Location: [facility] ENDOSCOPY;  Service: Pulmonary;;   BRONCHIAL NEEDLE ASPIRATION  BIOPSY  01/15/2021   Procedure: BRONCHIAL NEEDLE ASPIRATION BIOPSIES;  Surgeon: Shelah Lamar RAMAN, MD;  Location: [facility] ENDOSCOPY;  Service: Pulmonary;;   COLONOSCOPY  2022   FIDUCIAL MARKER PLACEMENT  01/15/2021   Procedure: FIDUCIAL MARKER PLACEMENT;  Surgeon: Shelah Lamar RAMAN, MD;  Location: [facility] ENDOSCOPY;  Service: Pulmonary;;   ovaries removed  1986   surgery to remove scar tissue     from colon/bladder in patient's late 30's   TUBAL LIGATION  1986   UPPER GI ENDOSCOPY     years ago in patient's late 40s   VIDEO BRONCHOSCOPY WITH RADIAL ENDOBRONCHIAL ULTRASOUND  01/15/2021   Procedure: VIDEO BRONCHOSCOPY WITH RADIAL ENDOBRONCHIAL ULTRASOUND;  Surgeon: Shelah Lamar RAMAN, MD;  Location: [facility] ENDOSCOPY;  Service: Pulmonary;;    Family History  Problem Relation Age of Onset   Pancreatic cancer Maternal Aunt 50   Brain cancer Maternal Uncle    Stomach cancer Maternal Uncle        dx. >50   Prostate cancer Half-Brother        passed away at 56   Prostate cancer Half-Brother     Social History:  reports that she quit smoking about 2 years ago. Her smoking use included cigarettes. She started smoking about 62 years ago. She has a 30.2 pack-year smoking history. She has never used smokeless  tobacco. She reports current alcohol use of about 4.0 standard drinks of alcohol per week. She reports current drug use. Frequency: 3.00 times per week. Drug: Marijuana.The patient is alone today.  Allergies:  Allergies  Allergen Reactions   Codeine Rash   Demerol [Meperidine Hcl] Other (See Comments)    coma   Meperidine Other (See Comments)    Several days will pass without patient being aware  Several days will pass without patient being aware    coma   Other Dermatitis    Stainless steel / skin weeps   Silver Dermatitis    Sterling silver skin weeps   Gold Bond [Menthol-Zinc Oxide] Dermatitis    Skin weeps   Neosporin [Bacitracin-Polymyxin B] Dermatitis    Skin weeps   Wound Dressing Adhesive Other (See Comments)    CAUSES SKIN TEARS PER PATIENT. INSTEAD USE THE TEGADERM 47M WITH ADHESIVE FREE WINDOW OVER PORT.   Wound Dressings Other (See Comments)    CAUSES SKIN TEARS PER PATIENT. INSTEAD USE THE TEGADERM 47M WITH ADHESIVE FREE WINDOW OVER PORT.    Current Medications: Current Outpatient Medications  Medication Sig Dispense Refill   aspirin 81 MG EC tablet Take 81 mg by mouth daily.     atorvastatin (LIPITOR) 40 MG tablet Take 40 mg by mouth daily.     celecoxib (CELEBREX) 200 MG capsule Take 200 mg by mouth daily.     HYDROcodone -acetaminophen  (NORCO/VICODIN) 5-325 MG tablet Take 1 tablet by mouth every 6 (six) hours as needed for moderate pain (pain score 4-6) or severe pain (pain score 7-10). 30 tablet 0   levothyroxine  (SYNTHROID ) 75 MCG tablet TAKE 1 TABLET(75 MCG) BY MOUTH DAILY 30 tablet 5   lidocaine -prilocaine  (EMLA ) cream Apply 1 Application topically as needed. 30 g 0   LORazepam  (ATIVAN ) 1 MG tablet Take 1 tablet (1 mg total) by mouth every 8 (eight) hours. Use 1-2 tablets by mouth prior to MRI scan 30 tablet 0   ondansetron  (ZOFRAN ) 4 MG tablet Take 1 tablet (4 mg total) by mouth every 4 (four) hours as needed for nausea. 90 tablet 3   pantoprazole  (PROTONIX )  40  MG tablet TAKE 1 TABLET(40 MG) BY MOUTH DAILY 60 tablet 1   Prenatal Vit-Fe Fumarate-FA (PRENATAL VITAMIN) 27-0.8 MG TABS Take 1 tablet by mouth daily. 30 tablet 5   prochlorperazine  (COMPAZINE ) 10 MG tablet TAKE 1 TABLET(10 MG) BY MOUTH EVERY 6 HOURS AS NEEDED FOR NAUSEA OR VOMITING 360 tablet 0   VENTOLIN  HFA 108 (90 Base) MCG/ACT inhaler Inhale 1-2 puffs into the lungs as needed for wheezing or shortness of breath.     No current facility-administered medications for this visit.   Facility-Administered Medications Ordered in Other Visits  Medication Dose Route Frequency Provider Last Rate Last Admin   sodium chloride  flush (NS) 0.9 % injection 10 mL  10 mL Intracatheter PRN Cornelius Wanda DEL, MD   10 mL at 08/21/23 1042    I,Jasmine M Lassiter,acting as a Neurosurgeon for Wanda DEL Cornelius, MD.,have documented all relevant documentation on the behalf of Wanda DEL Cornelius, MD,as directed by  Wanda DEL Cornelius, MD while in the presence of Wanda DEL Cornelius, MD.

## 2023-08-11 ENCOUNTER — Other Ambulatory Visit: Payer: Self-pay

## 2023-08-13 ENCOUNTER — Ambulatory Visit: Admitting: Cardiology

## 2023-08-14 ENCOUNTER — Encounter (HOSPITAL_BASED_OUTPATIENT_CLINIC_OR_DEPARTMENT_OTHER): Payer: Self-pay

## 2023-08-14 ENCOUNTER — Inpatient Hospital Stay (HOSPITAL_BASED_OUTPATIENT_CLINIC_OR_DEPARTMENT_OTHER): Admitting: Oncology

## 2023-08-14 ENCOUNTER — Inpatient Hospital Stay: Attending: Hematology and Oncology

## 2023-08-14 ENCOUNTER — Telehealth: Payer: Self-pay | Admitting: Oncology

## 2023-08-14 ENCOUNTER — Encounter: Payer: Self-pay | Admitting: Oncology

## 2023-08-14 ENCOUNTER — Ambulatory Visit (HOSPITAL_BASED_OUTPATIENT_CLINIC_OR_DEPARTMENT_OTHER)
Admission: RE | Admit: 2023-08-14 | Discharge: 2023-08-14 | Disposition: A | Discharge status: Home / Self Care | Source: Ambulatory Visit | Attending: Oncology | Admitting: Oncology

## 2023-08-14 VITALS — BP 138/92 | HR 73 | Temp 97.8°F | Resp 18 | Ht 59.0 in | Wt 96.7 lb

## 2023-08-14 DIAGNOSIS — I10 Essential (primary) hypertension: Secondary | ICD-10-CM | POA: Diagnosis not present

## 2023-08-14 DIAGNOSIS — Z902 Acquired absence of lung [part of]: Secondary | ICD-10-CM | POA: Diagnosis not present

## 2023-08-14 DIAGNOSIS — Z885 Allergy status to narcotic agent status: Secondary | ICD-10-CM | POA: Insufficient documentation

## 2023-08-14 DIAGNOSIS — R590 Localized enlarged lymph nodes: Secondary | ICD-10-CM | POA: Insufficient documentation

## 2023-08-14 DIAGNOSIS — F1290 Cannabis use, unspecified, uncomplicated: Secondary | ICD-10-CM | POA: Insufficient documentation

## 2023-08-14 DIAGNOSIS — C3492 Malignant neoplasm of unspecified part of left bronchus or lung: Secondary | ICD-10-CM

## 2023-08-14 DIAGNOSIS — R0602 Shortness of breath: Secondary | ICD-10-CM | POA: Diagnosis not present

## 2023-08-14 DIAGNOSIS — C7801 Secondary malignant neoplasm of right lung: Secondary | ICD-10-CM | POA: Diagnosis not present

## 2023-08-14 DIAGNOSIS — Z90722 Acquired absence of ovaries, bilateral: Secondary | ICD-10-CM | POA: Insufficient documentation

## 2023-08-14 DIAGNOSIS — C3432 Malignant neoplasm of lower lobe, left bronchus or lung: Secondary | ICD-10-CM | POA: Insufficient documentation

## 2023-08-14 DIAGNOSIS — J439 Emphysema, unspecified: Secondary | ICD-10-CM | POA: Insufficient documentation

## 2023-08-14 DIAGNOSIS — R5383 Other fatigue: Secondary | ICD-10-CM | POA: Diagnosis not present

## 2023-08-14 DIAGNOSIS — Z79899 Other long term (current) drug therapy: Secondary | ICD-10-CM | POA: Diagnosis not present

## 2023-08-14 DIAGNOSIS — C7802 Secondary malignant neoplasm of left lung: Secondary | ICD-10-CM | POA: Diagnosis not present

## 2023-08-14 DIAGNOSIS — Z87891 Personal history of nicotine dependence: Secondary | ICD-10-CM | POA: Insufficient documentation

## 2023-08-14 DIAGNOSIS — Z7982 Long term (current) use of aspirin: Secondary | ICD-10-CM | POA: Insufficient documentation

## 2023-08-14 DIAGNOSIS — Z800 Family history of malignant neoplasm of digestive organs: Secondary | ICD-10-CM | POA: Insufficient documentation

## 2023-08-14 DIAGNOSIS — Z8042 Family history of malignant neoplasm of prostate: Secondary | ICD-10-CM | POA: Diagnosis not present

## 2023-08-14 DIAGNOSIS — Z808 Family history of malignant neoplasm of other organs or systems: Secondary | ICD-10-CM | POA: Insufficient documentation

## 2023-08-14 DIAGNOSIS — C3490 Malignant neoplasm of unspecified part of unspecified bronchus or lung: Secondary | ICD-10-CM

## 2023-08-14 DIAGNOSIS — E032 Hypothyroidism due to medicaments and other exogenous substances: Secondary | ICD-10-CM | POA: Insufficient documentation

## 2023-08-14 DIAGNOSIS — Z90710 Acquired absence of both cervix and uterus: Secondary | ICD-10-CM | POA: Diagnosis not present

## 2023-08-14 DIAGNOSIS — R059 Cough, unspecified: Secondary | ICD-10-CM | POA: Diagnosis not present

## 2023-08-14 DIAGNOSIS — H9190 Unspecified hearing loss, unspecified ear: Secondary | ICD-10-CM | POA: Insufficient documentation

## 2023-08-14 DIAGNOSIS — Z79890 Hormone replacement therapy (postmenopausal): Secondary | ICD-10-CM | POA: Insufficient documentation

## 2023-08-14 DIAGNOSIS — J90 Pleural effusion, not elsewhere classified: Secondary | ICD-10-CM | POA: Insufficient documentation

## 2023-08-14 DIAGNOSIS — Z5112 Encounter for antineoplastic immunotherapy: Secondary | ICD-10-CM | POA: Insufficient documentation

## 2023-08-14 DIAGNOSIS — I700 Atherosclerosis of aorta: Secondary | ICD-10-CM | POA: Diagnosis not present

## 2023-08-14 LAB — CBC WITH DIFFERENTIAL (CANCER CENTER ONLY)
Abs Immature Granulocytes: 0.02 K/uL (ref 0.00–0.07)
Basophils Absolute: 0.2 K/uL — ABNORMAL HIGH (ref 0.0–0.1)
Basophils Relative: 2 %
Eosinophils Absolute: 1.4 K/uL — ABNORMAL HIGH (ref 0.0–0.5)
Eosinophils Relative: 16 %
HCT: 42.6 % (ref 36.0–46.0)
Hemoglobin: 14.4 g/dL (ref 12.0–15.0)
Immature Granulocytes: 0 %
Lymphocytes Relative: 23 %
Lymphs Abs: 2.1 K/uL (ref 0.7–4.0)
MCH: 32.4 pg (ref 26.0–34.0)
MCHC: 33.8 g/dL (ref 30.0–36.0)
MCV: 95.9 fL (ref 80.0–100.0)
Monocytes Absolute: 0.7 K/uL (ref 0.1–1.0)
Monocytes Relative: 8 %
Neutro Abs: 4.5 K/uL (ref 1.7–7.7)
Neutrophils Relative %: 51 %
Platelet Count: 263 K/uL (ref 150–400)
RBC: 4.44 MIL/uL (ref 3.87–5.11)
RDW: 12.8 % (ref 11.5–15.5)
WBC Count: 8.9 K/uL (ref 4.0–10.5)
nRBC: 0 % (ref 0.0–0.2)

## 2023-08-14 LAB — CMP (CANCER CENTER ONLY)
ALT: 10 U/L (ref 0–44)
AST: 17 U/L (ref 15–41)
Albumin: 3.8 g/dL (ref 3.5–5.0)
Alkaline Phosphatase: 79 U/L (ref 38–126)
Anion gap: 11 (ref 5–15)
BUN: 18 mg/dL (ref 8–23)
CO2: 26 mmol/L (ref 22–32)
Calcium: 9.6 mg/dL (ref 8.9–10.3)
Chloride: 103 mmol/L (ref 98–111)
Creatinine: 0.9 mg/dL (ref 0.44–1.00)
GFR, Estimated: >60 mL/min (ref >60)
Glucose, Bld: 104 mg/dL — ABNORMAL HIGH (ref 70–99)
Potassium: 4.2 mmol/L (ref 3.5–5.1)
Sodium: 140 mmol/L (ref 135–145)
Total Bilirubin: 1.8 mg/dL — ABNORMAL HIGH (ref 0.0–1.2)
Total Protein: 7.7 g/dL (ref 6.5–8.1)

## 2023-08-14 NOTE — Telephone Encounter (Signed)
 Patient has been scheduled for follow-up visit per 08/14/23 LOS.  Pt given an appt calendar with date and time.

## 2023-08-15 ENCOUNTER — Other Ambulatory Visit: Payer: Self-pay | Admitting: Pharmacist

## 2023-08-15 ENCOUNTER — Inpatient Hospital Stay

## 2023-08-15 ENCOUNTER — Encounter: Payer: Self-pay | Admitting: Oncology

## 2023-08-15 ENCOUNTER — Other Ambulatory Visit: Payer: Self-pay | Admitting: Oncology

## 2023-08-15 VITALS — BP 153/66 | HR 78 | Temp 98.2°F | Resp 18

## 2023-08-15 DIAGNOSIS — C3492 Malignant neoplasm of unspecified part of left bronchus or lung: Secondary | ICD-10-CM

## 2023-08-15 DIAGNOSIS — R112 Nausea with vomiting, unspecified: Secondary | ICD-10-CM

## 2023-08-15 DIAGNOSIS — Z5112 Encounter for antineoplastic immunotherapy: Secondary | ICD-10-CM | POA: Diagnosis not present

## 2023-08-15 MED ORDER — SODIUM CHLORIDE 0.9 % IV SOLN: INTRAVENOUS | Status: DC

## 2023-08-15 MED ORDER — SODIUM CHLORIDE 0.9% FLUSH
10.0000 mL | INTRAVENOUS | Status: DC | PRN
Start: 2023-08-15 — End: 2023-08-15
  Administered 2023-08-15: 10 mL

## 2023-08-15 MED ORDER — SODIUM CHLORIDE 0.9 % IV SOLN
200.0000 mg | Freq: Once | INTRAVENOUS | Status: AC
  Administered 2023-08-15: 200 mg via INTRAVENOUS
  Filled 2023-08-15: qty 8

## 2023-08-15 MED ORDER — HEPARIN SOD (PORK) LOCK FLUSH 100 UNIT/ML IV SOLN
500.0000 [IU] | Freq: Once | INTRAVENOUS | Status: AC | PRN
  Administered 2023-08-15: 500 [IU]

## 2023-08-15 NOTE — Patient Instructions (Signed)
 [facility] [place] - [facility]  Discharge Instructions: Thank you for choosing [facility] to provide your oncology and hematology care.  If you have a lab appointment with the Cancer Center, please go directly to the Cancer Center and check in at the registration area.   Wear comfortable clothing and clothing appropriate for easy access to any Portacath or PICC line.   We strive to give you quality time with your provider. You may need to reschedule your appointment if you arrive late (15 or more minutes).  Arriving late affects you and other patients whose appointments are after yours.  Also, if you miss three or more appointments without notifying the office, you may be dismissed from the clinic at the provider's discretion.      For prescription refill requests, have your pharmacy contact our office and allow 72 hours for refills to be completed.    Today you received the following chemotherapy and/or immunotherapy agents Keytruda      To help prevent nausea and vomiting after your treatment, we encourage you to take your nausea medication as directed.  BELOW ARE SYMPTOMS THAT SHOULD BE REPORTED IMMEDIATELY: *FEVER GREATER THAN 100.4 F (38 C) OR HIGHER *CHILLS OR SWEATING *NAUSEA AND VOMITING THAT IS NOT CONTROLLED WITH YOUR NAUSEA MEDICATION *UNUSUAL SHORTNESS OF BREATH *UNUSUAL BRUISING OR BLEEDING *URINARY PROBLEMS (pain or burning when urinating, or frequent urination) *BOWEL PROBLEMS (unusual diarrhea, constipation, pain near the anus) TENDERNESS IN MOUTH AND THROAT WITH OR WITHOUT PRESENCE OF ULCERS (sore throat, sores in mouth, or a toothache) UNUSUAL RASH, SWELLING OR PAIN  UNUSUAL VAGINAL DISCHARGE OR ITCHING   Items with * indicate a potential emergency and should be followed up as soon as possible or go to the Emergency Department if any problems should occur.  Please show the CHEMOTHERAPY ALERT CARD or IMMUNOTHERAPY ALERT  CARD at check-in to the Emergency Department and triage nurse.  Should you have questions after your visit or need to cancel or reschedule your appointment, please contact [facility] [place] [facility]  Dept: [phone]  and follow the prompts.  Office hours are 8:00 a.m. to 4:30 p.m. Monday - Friday. Please note that voicemails left after 4:00 p.m. may not be returned until the following business day.  We are closed weekends and major holidays. You have access to a nurse at all times for urgent questions. Please call the main number to the clinic Dept: [phone] and follow the prompts.  For any non-urgent questions, you may also contact your provider using MyChart. We now offer e-Visits for anyone 72 and older to request care online for non-urgent symptoms. For details visit [email or web address].   Also download the MyChart app! Go to the app store, search "MyChart", open the app, select [place], and log in with your MyChart username and password.

## 2023-08-15 NOTE — Progress Notes (Signed)
 Ok to proceed with pembrolizumab  despite bili=1.8 per Dr. Cornelius.   She is also aware of TSH=15.449.

## 2023-08-15 NOTE — Telephone Encounter (Signed)
Already sent 1 week ago

## 2023-08-19 ENCOUNTER — Other Ambulatory Visit: Payer: Self-pay | Admitting: Oncology

## 2023-08-19 DIAGNOSIS — E032 Hypothyroidism due to medicaments and other exogenous substances: Secondary | ICD-10-CM

## 2023-08-20 ENCOUNTER — Ambulatory Visit: Attending: Cardiology | Admitting: Cardiology

## 2023-08-20 ENCOUNTER — Encounter: Payer: Self-pay | Admitting: Cardiology

## 2023-08-20 VITALS — BP 138/78 | HR 68 | Ht 59.0 in | Wt 93.6 lb

## 2023-08-20 DIAGNOSIS — F17200 Nicotine dependence, unspecified, uncomplicated: Secondary | ICD-10-CM | POA: Diagnosis not present

## 2023-08-20 DIAGNOSIS — C7800 Secondary malignant neoplasm of unspecified lung: Secondary | ICD-10-CM

## 2023-08-20 DIAGNOSIS — J431 Panlobular emphysema: Secondary | ICD-10-CM | POA: Diagnosis not present

## 2023-08-20 DIAGNOSIS — F17210 Nicotine dependence, cigarettes, uncomplicated: Secondary | ICD-10-CM

## 2023-08-20 DIAGNOSIS — I7090 Unspecified atherosclerosis: Secondary | ICD-10-CM | POA: Diagnosis not present

## 2023-08-20 DIAGNOSIS — Z720 Tobacco use: Secondary | ICD-10-CM

## 2023-08-20 DIAGNOSIS — E782 Mixed hyperlipidemia: Secondary | ICD-10-CM

## 2023-08-20 DIAGNOSIS — I10 Essential (primary) hypertension: Secondary | ICD-10-CM

## 2023-08-20 NOTE — Progress Notes (Signed)
 Cardiology Office Note:    Date:  08/20/2023   ID:  Sydney Hunt, DOB 09-04-1960, MRN [id number]  PCP:  Jama Chow, MD  Cardiologist:  Jennifer JONELLE Crape, MD   Referring MD: Jama Chow, MD    ASSESSMENT:    1. Primary hypertension   2. Atherosclerotic vascular disease   3. Panlobular emphysema (HCC)   4. Malignant neoplasm metastatic to lung, unspecified laterality (HCC)   5. Tobacco use   6. Mixed dyslipidemia    PLAN:    In order of problems listed above:  Coronary artery disease: Secondary prevention stressed with the patient.  Importance of compliance with diet medication stressed and she vocalized understanding. Cigarette smoker: I spent 5 minutes with the patient discussing solely about smoking. Smoking cessation was counseled. I suggested to the patient also different medications and pharmacological interventions. Patient is keen to try stopping on its own at this time. He will get back to me if he needs any further assistance in this matter. Essential hypertension: Blood pressure stable and diet was emphasized. Mixed dyslipidemia: On lipid-lowering medicine followed by primary care.  Goal LDL must be less than 60. History of cancer: Followed by oncology colleagues. Patient will be seen in follow-up appointment in 6 months or earlier if the patient has any concerns.    Medication Adjustments/Labs and Tests Ordered: Current medicines are reviewed at length with the patient today.  Concerns regarding medicines are outlined above.  Orders Placed This Encounter  Procedures   EKG 12-Lead   No orders of the defined types were placed in this encounter.    No chief complaint on file.    History of Present Illness:    Sydney Hunt is a 63 y.o. female.  Patient has past medical history of coronary artery disease, essential hypertension, mixed dyslipidemia, COPD, cancer with advanced age and also unfortunately continues to smoke.  No chest pain orthopnea or PND.  She leads a  sedentary lifestyle.  At the time of my evaluation, the patient is alert awake oriented and in no distress.  Past Medical History:  Diagnosis Date   Allergy    Arthritis    Atherosclerotic vascular disease 01/16/2022   Cardiac murmur 01/16/2022   Complication of anesthesia    slow to wake up   COPD (chronic obstructive pulmonary disease) (HCC)    COPD, mild (HCC) 12/15/2020   DOE (dyspnea on exertion) 01/16/2022   Family history of pancreatic cancer 11/28/2020   Family history of prostate cancer 11/28/2020   Family history of stomach cancer 11/28/2020   Genetic testing 12/18/2020   [facility] Multi-Cancer Panel was Negative. Report date is 12/12/2020.     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   Leukocytosis 01/17/2022   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) 02/11/2023   Multiple bilateral pulmonary nodules up to 7 mm in diameter, increased in size and number from prior study of August 2024     Migraines    Nodule of left lung 11/21/2020  Non-small cell lung cancer, left (HCC) 01/15/2021   Port-A-Cath in place 09/05/2022   Tobacco use 12/07/2020    Past Surgical History:  Procedure Laterality Date   ABDOMINAL HYSTERECTOMY  1989   unsure of vaginal or abdominal   BRONCHIAL BIOPSY  01/15/2021   Procedure: BRONCHIAL BIOPSIES;  Surgeon: Shelah Lamar RAMAN, MD;  Location: [facility] ENDOSCOPY;  Service: Pulmonary;;   BRONCHIAL BRUSHINGS   01/15/2021   Procedure: BRONCHIAL BRUSHINGS;  Surgeon: Shelah Lamar RAMAN, MD;  Location: [facility] ENDOSCOPY;  Service: Pulmonary;;   BRONCHIAL NEEDLE ASPIRATION BIOPSY  01/15/2021   Procedure: BRONCHIAL NEEDLE ASPIRATION BIOPSIES;  Surgeon: Shelah Lamar RAMAN, MD;  Location: [facility] ENDOSCOPY;  Service: Pulmonary;;   COLONOSCOPY  2022   FIDUCIAL MARKER PLACEMENT  01/15/2021   Procedure: FIDUCIAL MARKER PLACEMENT;  Surgeon: Shelah Lamar RAMAN, MD;  Location: [facility] ENDOSCOPY;  Service: Pulmonary;;   ovaries removed  1986   surgery to remove scar tissue     from colon/bladder in patient's late 30's   TUBAL LIGATION  1986   UPPER GI ENDOSCOPY     years ago in patient's late 83s   VIDEO BRONCHOSCOPY WITH RADIAL ENDOBRONCHIAL ULTRASOUND  01/15/2021   Procedure: VIDEO BRONCHOSCOPY WITH RADIAL ENDOBRONCHIAL ULTRASOUND;  Surgeon: Shelah Lamar RAMAN, MD;  Location: [facility] ENDOSCOPY;  Service: Pulmonary;;    Current Medications: Current Meds  Medication Sig   aspirin 81 MG EC tablet Take 81 mg by mouth daily.   atorvastatin (LIPITOR) 40 MG tablet Take 40 mg by mouth daily.   celecoxib (CELEBREX) 200 MG capsule Take 200 mg by mouth daily.   HYDROcodone -acetaminophen  (NORCO/VICODIN) 5-325 MG tablet Take 1 tablet by mouth every 6 (six) hours as needed for moderate pain (pain score 4-6) or severe pain (pain score 7-10).   levothyroxine  (SYNTHROID ) 75 MCG tablet Take 1 tablet (75 mcg total) by mouth daily.   lidocaine -prilocaine  (EMLA ) cream Apply 1 Application topically as needed.   LORazepam  (ATIVAN ) 1 MG tablet Take 1 tablet (1 mg total) by mouth every 8 (eight) hours. Use 1-2 tablets by mouth prior to MRI scan   ondansetron  (ZOFRAN ) 4 MG tablet Take 1 tablet (4 mg total) by mouth every 4 (four) hours as needed for nausea.   pantoprazole  (PROTONIX ) 40 MG tablet TAKE 1 TABLET(40 MG) BY MOUTH DAILY   Prenatal Vit-Fe Fumarate-FA (PRENATAL VITAMIN) 27-0.8 MG TABS Take 1 tablet by mouth daily.   prochlorperazine  (COMPAZINE ) 10 MG  tablet TAKE 1 TABLET(10 MG) BY MOUTH EVERY 6 HOURS AS NEEDED FOR NAUSEA OR VOMITING   VENTOLIN  HFA 108 (90 Base) MCG/ACT inhaler Inhale 1-2 puffs into the lungs as needed for wheezing or shortness of breath.   [DISCONTINUED] azithromycin  (ZITHROMAX  Z-PAK) 250 MG tablet 2 pills today, then 1 pill daily     Allergies:   Codeine, Demerol [meperidine hcl], Meperidine, Other, Silver, Gold bond [menthol-zinc oxide], Neosporin [bacitracin-polymyxin b], Wound dressing adhesive, and Wound dressings   Social History   Socioeconomic History   Marital status: Widowed    Spouse name: Not on file   Number of children: 2   Years of education: Not on file   Highest education level: Not on file  Occupational History   Occupation: unemployed  Tobacco Use   Smoking status: Former    Current packs/day: 0.50    Average packs/day: 0.5 packs/day for 60.4 years (30.2 ttl pk-yrs)    Types: Cigarettes    Start date: 62    Quit date: 04/2021   Smokeless tobacco: Never  Tobacco comments:    10 cigarettes smoked daily ARJ 12/15/20    08/15/2022 pt states she quit 3 weeks ago. kd  Vaping Use   Vaping status: Never Used  Substance and Sexual Activity   Alcohol use: Yes    Alcohol/week: 4.0 standard drinks of alcohol    Types: 1 Glasses of wine, 3 Shots of liquor per week    Comment: occasional   Drug use: Yes    Frequency: 3.0 times per week    Types: Marijuana    Comment: current use   Sexual activity: Not Currently    Birth control/protection: Post-menopausal  Other Topics Concern   Not on file  Social History Narrative   Not on file   Social Drivers of Health   Financial Resource Strain: Not on file  Food Insecurity: Not on file  Transportation Needs: Not on file  Physical Activity: Not on file  Stress: Not on file  Social Connections: Not on file     Family History: The patient's family history includes Brain cancer in her maternal uncle; Pancreatic cancer (age of onset: 28) in her  maternal aunt; Prostate cancer in her half-brother and half-brother; Stomach cancer in her maternal uncle.  ROS:   Please see the history of present illness.    All other systems reviewed and are negative.  EKGs/Labs/Other Studies Reviewed:    The following studies were reviewed today: .SABRAEKG Interpretation Date/Time:  Wednesday August 20 2023 10:30:10 EDT Ventricular Rate:  68 PR Interval:  142 QRS Duration:  92 QT Interval:  452 QTC Calculation: 480 R Axis:   108  Text Interpretation: Normal sinus rhythm Biatrial enlargement Rightward axis Pulmonary disease pattern T wave abnormality, consider anterior ischemia Abnormal ECG When compared with ECG of 15-Jan-2021 08:28, Left posterior fascicular block is no longer Present T wave inversion less evident in Inferior leads Confirmed by Edwyna Backers [phone]) on 08/20/2023 10:41:41 AM     Recent Labs: 07/22/2023: TSH 15.449 08/14/2023: ALT 10; BUN 18; Creatinine 0.90; Hemoglobin 14.4; Platelet Count 263; Potassium 4.2; Sodium 140  Recent Lipid Panel    Component Value Date/Time   CHOL 144 05/16/2022 0000   TRIG 134 05/16/2022 0000   HDL 42 05/16/2022 0000   LDLCALC 78 05/16/2022 0000    Physical Exam:    VS:  BP 138/78   Pulse 68   Ht 4' 11 (1.499 m)   Wt 93 lb 9.6 oz (42.5 kg)   LMP  (LMP Unknown)   SpO2 91%   BMI 18.90 kg/m     Wt Readings from Last 3 Encounters:  08/20/23 93 lb 9.6 oz (42.5 kg)  08/14/23 96 lb 11.2 oz (43.9 kg)  07/22/23 97 lb 1.6 oz (44 kg)     GEN: Patient is in no acute distress HEENT: Normal NECK: No JVD; No carotid bruits LYMPHATICS: No lymphadenopathy CARDIAC: Hear sounds regular, 2/6 systolic murmur at the apex. RESPIRATORY:  Clear to auscultation without rales, wheezing or rhonchi  ABDOMEN: Soft, non-tender, non-distended MUSCULOSKELETAL:  No edema; No deformity  SKIN: Warm and dry NEUROLOGIC:  Alert and oriented x 3 PSYCHIATRIC:  Normal affect   Signed, Backers JONELLE Edwyna, MD   08/20/2023 10:50 AM    [facility]

## 2023-08-20 NOTE — Patient Instructions (Signed)
 Medication Instructions:  Continue same medications *If you need a refill on your cardiac medications before your next appointment, please call your pharmacy*  Lab Work: None ordered  Testing/Procedures: None ordered  Follow-Up: At [facility], you and your health needs are our priority.  As part of our continuing mission to provide you with exceptional heart care, our providers are all part of one team.  This team includes your primary Cardiologist (physician) and Advanced Practice Providers or APPs (Physician Assistants and Nurse Practitioners) who all work together to provide you with the care you need, when you need it.  Your next appointment:  12 months    Provider:  Dr.Revankar   We recommend signing up for the patient portal called MyChart.  Sign up information is provided on this After Visit Summary.  MyChart is used to connect with patients for Virtual Visits (Telemedicine).  Patients are able to view lab/test results, encounter notes, upcoming appointments, etc.  Non-urgent messages can be sent to your provider as well.   To learn more about what you can do with MyChart, go to [email or web address].

## 2023-08-21 ENCOUNTER — Inpatient Hospital Stay

## 2023-08-21 ENCOUNTER — Ambulatory Visit (INDEPENDENT_AMBULATORY_CARE_PROVIDER_SITE_OTHER)
Admission: RE | Admit: 2023-08-21 | Discharge: 2023-08-21 | Disposition: A | Discharge status: Home / Self Care | Source: Ambulatory Visit | Attending: Oncology | Admitting: Oncology

## 2023-08-21 ENCOUNTER — Other Ambulatory Visit: Payer: Self-pay | Admitting: Oncology

## 2023-08-21 DIAGNOSIS — Z5112 Encounter for antineoplastic immunotherapy: Secondary | ICD-10-CM | POA: Diagnosis not present

## 2023-08-21 DIAGNOSIS — C3490 Malignant neoplasm of unspecified part of unspecified bronchus or lung: Secondary | ICD-10-CM

## 2023-08-21 DIAGNOSIS — Z95828 Presence of other vascular implants and grafts: Secondary | ICD-10-CM

## 2023-08-21 DIAGNOSIS — E032 Hypothyroidism due to medicaments and other exogenous substances: Secondary | ICD-10-CM

## 2023-08-21 MED ORDER — HEPARIN SOD (PORK) LOCK FLUSH 100 UNIT/ML IV SOLN
500.0000 [IU] | Freq: Once | INTRAVENOUS | Status: AC | PRN
  Administered 2023-08-21: 500 [IU]

## 2023-08-21 MED ORDER — GADOBUTROL 1 MMOL/ML IV SOLN
4.0000 mL | Freq: Once | INTRAVENOUS | Status: AC | PRN
  Administered 2023-08-21: 4 mL via INTRAVENOUS

## 2023-08-21 MED ORDER — SODIUM CHLORIDE 0.9% FLUSH
10.0000 mL | INTRAVENOUS | Status: AC | PRN
  Administered 2023-08-21: 10 mL

## 2023-08-21 NOTE — Addendum Note (Signed)
 Addended by: WYNN PFEIFFER E on: 08/21/2023 10:44 AM   Modules accepted: Orders

## 2023-08-27 ENCOUNTER — Encounter: Payer: Self-pay | Admitting: Oncology

## 2023-08-28 ENCOUNTER — Other Ambulatory Visit (HOSPITAL_BASED_OUTPATIENT_CLINIC_OR_DEPARTMENT_OTHER): Admitting: Radiology

## 2023-08-28 ENCOUNTER — Telehealth: Payer: Self-pay

## 2023-08-28 NOTE — Telephone Encounter (Signed)
Called patient and notified her of the message

## 2023-08-28 NOTE — Telephone Encounter (Signed)
-----   Message from [name] sent at 08/27/2023  7:54 PM EDT ----- Regarding: call Tell her MRI brain is all clear

## 2023-09-04 ENCOUNTER — Inpatient Hospital Stay: Attending: Hematology and Oncology | Admitting: Hematology and Oncology

## 2023-09-04 ENCOUNTER — Other Ambulatory Visit: Payer: Self-pay

## 2023-09-04 ENCOUNTER — Encounter: Payer: Self-pay | Admitting: Oncology

## 2023-09-04 ENCOUNTER — Inpatient Hospital Stay

## 2023-09-04 VITALS — BP 142/82 | HR 78 | Temp 98.3°F | Ht 59.0 in | Wt 84.3 lb

## 2023-09-04 DIAGNOSIS — Z7982 Long term (current) use of aspirin: Secondary | ICD-10-CM | POA: Diagnosis not present

## 2023-09-04 DIAGNOSIS — C7801 Secondary malignant neoplasm of right lung: Secondary | ICD-10-CM

## 2023-09-04 DIAGNOSIS — Z808 Family history of malignant neoplasm of other organs or systems: Secondary | ICD-10-CM | POA: Insufficient documentation

## 2023-09-04 DIAGNOSIS — R0602 Shortness of breath: Secondary | ICD-10-CM | POA: Insufficient documentation

## 2023-09-04 DIAGNOSIS — C3492 Malignant neoplasm of unspecified part of left bronchus or lung: Secondary | ICD-10-CM

## 2023-09-04 DIAGNOSIS — Z885 Allergy status to narcotic agent status: Secondary | ICD-10-CM | POA: Diagnosis not present

## 2023-09-04 DIAGNOSIS — Z90710 Acquired absence of both cervix and uterus: Secondary | ICD-10-CM | POA: Diagnosis not present

## 2023-09-04 DIAGNOSIS — J90 Pleural effusion, not elsewhere classified: Secondary | ICD-10-CM | POA: Diagnosis not present

## 2023-09-04 DIAGNOSIS — Z5112 Encounter for antineoplastic immunotherapy: Secondary | ICD-10-CM | POA: Diagnosis present

## 2023-09-04 DIAGNOSIS — M21379 Foot drop, unspecified foot: Secondary | ICD-10-CM | POA: Diagnosis not present

## 2023-09-04 DIAGNOSIS — I10 Essential (primary) hypertension: Secondary | ICD-10-CM | POA: Diagnosis not present

## 2023-09-04 DIAGNOSIS — R058 Other specified cough: Secondary | ICD-10-CM | POA: Diagnosis not present

## 2023-09-04 DIAGNOSIS — C3432 Malignant neoplasm of lower lobe, left bronchus or lung: Secondary | ICD-10-CM | POA: Diagnosis present

## 2023-09-04 DIAGNOSIS — Z79890 Hormone replacement therapy (postmenopausal): Secondary | ICD-10-CM | POA: Diagnosis not present

## 2023-09-04 DIAGNOSIS — E032 Hypothyroidism due to medicaments and other exogenous substances: Secondary | ICD-10-CM | POA: Diagnosis not present

## 2023-09-04 DIAGNOSIS — R634 Abnormal weight loss: Secondary | ICD-10-CM | POA: Diagnosis not present

## 2023-09-04 DIAGNOSIS — F1290 Cannabis use, unspecified, uncomplicated: Secondary | ICD-10-CM | POA: Diagnosis not present

## 2023-09-04 DIAGNOSIS — Z79899 Other long term (current) drug therapy: Secondary | ICD-10-CM | POA: Diagnosis not present

## 2023-09-04 DIAGNOSIS — Z8042 Family history of malignant neoplasm of prostate: Secondary | ICD-10-CM | POA: Insufficient documentation

## 2023-09-04 DIAGNOSIS — J439 Emphysema, unspecified: Secondary | ICD-10-CM | POA: Insufficient documentation

## 2023-09-04 DIAGNOSIS — C7802 Secondary malignant neoplasm of left lung: Secondary | ICD-10-CM

## 2023-09-04 DIAGNOSIS — Z90722 Acquired absence of ovaries, bilateral: Secondary | ICD-10-CM | POA: Diagnosis not present

## 2023-09-04 DIAGNOSIS — Z87891 Personal history of nicotine dependence: Secondary | ICD-10-CM | POA: Insufficient documentation

## 2023-09-04 DIAGNOSIS — Z800 Family history of malignant neoplasm of digestive organs: Secondary | ICD-10-CM | POA: Diagnosis not present

## 2023-09-04 LAB — CMP (CANCER CENTER ONLY)
ALT: 8 U/L (ref 0–44)
AST: 15 U/L (ref 15–41)
Albumin: 4 g/dL (ref 3.5–5.0)
Alkaline Phosphatase: 90 U/L (ref 38–126)
Anion gap: 11 (ref 5–15)
BUN: 17 mg/dL (ref 8–23)
CO2: 25 mmol/L (ref 22–32)
Calcium: 9.7 mg/dL (ref 8.9–10.3)
Chloride: 103 mmol/L (ref 98–111)
Creatinine: 0.84 mg/dL (ref 0.44–1.00)
GFR, Estimated: >60 mL/min (ref >60)
Glucose, Bld: 95 mg/dL (ref 70–99)
Potassium: 3.9 mmol/L (ref 3.5–5.1)
Sodium: 138 mmol/L (ref 135–145)
Total Bilirubin: 2.1 mg/dL — ABNORMAL HIGH (ref 0.0–1.2)
Total Protein: 7.9 g/dL (ref 6.5–8.1)

## 2023-09-04 LAB — CBC WITH DIFFERENTIAL (CANCER CENTER ONLY)
Abs Immature Granulocytes: 0.01 K/uL (ref 0.00–0.07)
Basophils Absolute: 0.2 K/uL — ABNORMAL HIGH (ref 0.0–0.1)
Basophils Relative: 2 %
Eosinophils Absolute: 1.8 K/uL — ABNORMAL HIGH (ref 0.0–0.5)
Eosinophils Relative: 20 %
HCT: 43 % (ref 36.0–46.0)
Hemoglobin: 15 g/dL (ref 12.0–15.0)
Immature Granulocytes: 0 %
Lymphocytes Relative: 28 %
Lymphs Abs: 2.5 K/uL (ref 0.7–4.0)
MCH: 32.6 pg (ref 26.0–34.0)
MCHC: 34.9 g/dL (ref 30.0–36.0)
MCV: 93.5 fL (ref 80.0–100.0)
Monocytes Absolute: 0.8 K/uL (ref 0.1–1.0)
Monocytes Relative: 9 %
Neutro Abs: 3.6 K/uL (ref 1.7–7.7)
Neutrophils Relative %: 41 %
Platelet Count: 275 K/uL (ref 150–400)
RBC: 4.6 MIL/uL (ref 3.87–5.11)
RDW: 12.6 % (ref 11.5–15.5)
WBC Count: 8.8 K/uL (ref 4.0–10.5)
nRBC: 0 % (ref 0.0–0.2)

## 2023-09-04 LAB — TSH: TSH: 9.63 u[IU]/mL — ABNORMAL HIGH (ref 0.350–4.500)

## 2023-09-04 MED ORDER — AZITHROMYCIN 250 MG PO TABS: ORAL_TABLET | ORAL | 0 refills | Status: DC

## 2023-09-04 NOTE — Progress Notes (Signed)
 [facility]  [address] [place],  [place]  [phone]  Clinic Day:  09/04/2023  Referring physician: Jama Chow, MD  ASSESSMENT & PLAN:   Assessment & Plan: Non-small cell lung cancer, left (HCC) Stage IIB adenocarcinoma of the left lung diagnosed in December 2022.  She was treated with left lower lobectomy in [place] .  Her doctor there recommended chemotherapy with immunotherapy, but she refused chemotherapy.  PD-L1 was positive.  She was only willing to take the immunotherapy for 1 year. She started pembrolizumab  in August 2023.  We saw her in October 2023, when she returned to [place]  and continued pembrolizumab  every 21 days. Routine CT imaging had not shown any evidence of recurrence.  She completed 17 cycles of adjuvant pembrolizumab  in August and CT chest following that was negative for recurrence. There were stable scattered small bilateral pulmonary nodules, felt to be from benign emphysema.  Surgical changes were seen in the left lower lobe.   Unfortunately, she had evidence of recurrence on CT chest in December 2024 with interval progression in the size and number of multiple bilateral pulmonary nodules most consistent with progression of metastatic disease. We decided not to pursue a biopsy in view of the difficulty involved. Treatment options were discussed and she wished not to pursue chemotherapy unless absolutely necessary.  She was placed back on pembrolizumab .  Repeat CT imaging in June revealed very mild progression with some increased mediastinal and bilateral hilar lymphadenopathy. There were multiple bilateral pulmonary nodules measuring up to 8 mm in size, so just mildly progressive. There was a small left pleural effusion with mild pleural thickening. After discussion with the patient, we opted to continue immunotherapy due to the extremely slow progression of her disease.  She wishes to avoid IV chemotherapy if possible.  She  understands chemotherapy probably will be necessary in the future.   She has a congested cough and has lost weight.  I will place her on azithromycin .  I encouraged her to use nutritional supplements.  I will ask our dietician to touch base with her.  She will proceed with her 11th cycle of pembrolizumab  tomorrow.  We will plan to see her back in 3 weeks with a CBC, comprehensive metabolic panel and TSH prior to her a 12th cycle of pembrolizumab .  Malignant neoplasm metastatic to lung [facility]) CT chest in December 2024 revealed multiple bilateral pulmonary nodules which increased in size and number since imaging in August 2024.  These measure up to 7 mm in diameter.  There was an area of pleural plaque in the posteriomedial left upper lobe, measuring 2.9 cm.  Prior PD-L1 was positive at 90%, so she resumed pembrolizumab  in January.    She will proceed with a 11th cycle of pembrolizumab  tomorrow.  We will plan to see her back in 3 weeks with a CBC, comprehensive metabolic panel and TSH prior to a 12th cycle.   Hypothyroidism due to medication The TSH has fluctuated up and down.  She she is currently on levothyroxine  75 mcg daily.  Her last TSH was had increased to 15, no adjustments in her medication were made.  TSH is pending from today.  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  We will proceed with pembrolizumab  and continue to monitor this.    The patient understands the plans discussed today and  is in agreement with them.  She knows to contact our office if she develops concerns prior to her next appointment.   I provided 20 minutes of face-to-face time during this encounter and > 50% was spent counseling as documented under my assessment and plan.    Nocole Zammit A Alyria Krack, PA-C  [facility] [place] - A DEPT OF [facility] [address] [place] [address] Dept: [phone] Dept Fax: [phone]   No orders of the defined types were placed in this encounter.     CHIEF COMPLAINT:  CC: Metastatic non-small cell lung cancer  Current Treatment: Pembrolizumab  every 3 weeks  HISTORY OF PRESENT ILLNESS:   Oncology History  Non-small cell lung cancer, left (HCC)  01/15/2021 Initial Diagnosis   Non-small cell lung cancer, left (HCC)   02/12/2021 Cancer Staging   Staging form: Lung, AJCC 8th Edition - Clinical stage from 02/12/2021: Stage IA2 (cT1b, cN0, cM0) - Signed by Cornelius Wanda VEAR, MD on 02/14/2021 Histopathologic type: Adenocarcinoma, NOS Stage prefix: Initial diagnosis Histologic grade (G): GX Histologic grading system: 4 grade system Laterality: Left Tumor size (mm): 19 Lymph-vascular invasion (LVI): LVI not present (absent)/not identified Diagnostic confirmation: Positive histology Specimen type: Bronchial Biopsy Staged by: Managing physician Stage used in treatment planning: Yes National guidelines used in treatment planning: Yes Type of national guideline used in treatment planning: NCCN Staging comments: Rec surgical resection   05/11/2021 Cancer Staging   Staging form: Lung, AJCC 8th Edition - Pathologic stage from 05/11/2021: Stage IIB (ypT1c, pN1, cM0) - Signed by Cornelius Wanda VEAR, MD on 11/12/2021 Histopathologic type: Adenocarcinoma, NOS Stage prefix: Post-therapy Histologic grade (G): G2 Histologic grading system: 4 grade system Residual tumor (R): R0 - None Laterality: Left Tumor size (mm): 25 Lymph-vascular invasion (LVI): LVI not present (absent)/not identified Diagnostic confirmation: Positive histology PLUS positive immunophenotyping and/or positive genetic studies Specimen type: Excision Staged by: Managing physician Type of lung cancer: Surgically resected non-small cell lung cancer ECOG performance status: Grade 1 Perineural invasion (PNI):  Absent Pleural/elastic layer invasion: PL0 Pleural lavage cytology: Unknown Weight loss: Absent Adequacy of mediastinal dissection: Adequate Radiotherapy dose: No Adjuvant radiation: No Adjuvant chemotherapy: Yes Stage used in treatment planning: Yes National guidelines used in treatment planning: Yes Type of national guideline used in treatment planning: NCCN Staging comments: Adjuvant pembrolizumab , PDL 1 90%   10/04/2021 - 10/01/2022 Chemotherapy   Patient is on Treatment Plan : LUNG NSCLC Pembrolizumab  Adj q21d x 18 cycles     02/04/2023 -  Chemotherapy   Patient is on Treatment Plan : LUNG NSCLC Pembrolizumab  (200) q21d         INTERVAL HISTORY:   Daenerys is here today for repeat clinical assessment prior to continuing pembrolizumab .  She reports a congested cough productive of clear sputum.  She denies progressive shortness of breath.  She denies fevers or chills. She denies pain. Her appetite is decreased. Her weight has decreased 9 pounds over last 3 weeks.  She quit smoking again last week.  REVIEW OF SYSTEMS:   Review of Systems  Constitutional:  Positive for appetite change and unexpected weight change. Negative for chills, fatigue and fever.  HENT:   Negative for lump/mass, mouth sores and sore throat.   Respiratory:  Positive for cough and shortness of breath (with exertion). Negative for hemoptysis.   Cardiovascular:  Negative for chest pain and leg swelling.  Gastrointestinal:  Negative for abdominal pain, constipation, diarrhea, nausea and vomiting.  Endocrine: Negative for hot flashes.  Genitourinary:  Negative for difficulty urinating, dysuria, frequency and hematuria.   Musculoskeletal:  Negative for arthralgias, back pain, gait problem and myalgias.  Skin:  Negative for rash.  Neurological:  Negative for dizziness, extremity weakness, gait problem, headaches and light-headedness.  Hematological:  Negative for adenopathy. Does not bruise/bleed easily.   Psychiatric/Behavioral:  Negative for depression and sleep disturbance. The patient is not nervous/anxious.      VITALS:   Blood pressure (!) 142/82, pulse 78, temperature 98.3 F (36.8 C), temperature source Oral, height 4' 11 (1.499 m), weight 84 lb 4.8 oz (38.2 kg), SpO2 93%.  Wt Readings from Last 3 Encounters:  09/05/23 94 lb 6.4 oz (42.8 kg)  09/04/23 84 lb 4.8 oz (38.2 kg)  08/20/23 93 lb 9.6 oz (42.5 kg)    Body mass index is 17.03 kg/m.  Performance status (ECOG): 1 - Symptomatic but completely ambulatory    PHYSICAL EXAM:   Physical Exam Vitals and nursing note reviewed.  Constitutional:      General: She is not in acute distress.    Appearance: Normal appearance.  HENT:     Head: Normocephalic and atraumatic.     Mouth/Throat:     Mouth: Mucous membranes are moist.     Pharynx: Oropharynx is clear. No oropharyngeal exudate or posterior oropharyngeal erythema.  Eyes:     General: No scleral icterus.    Extraocular Movements: Extraocular movements intact.     Conjunctiva/sclera: Conjunctivae normal.     Pupils: Pupils are equal, round, and reactive to light.  Cardiovascular:     Rate and Rhythm: Normal rate and regular rhythm.     Heart sounds: Normal heart sounds. No murmur heard.    No friction rub. No gallop.  Pulmonary:     Effort: Pulmonary effort is normal.     Breath sounds: Normal breath sounds. Transmitted upper airway sounds present. No wheezing, rhonchi or rales.  Abdominal:     General: There is no distension.     Palpations: Abdomen is soft. There is no mass.     Tenderness: There is no abdominal tenderness.  Musculoskeletal:        General: Normal range of motion.     Cervical back: Normal range of motion and neck supple. No tenderness.     Right lower leg: No edema.     Left lower leg: No edema.  Lymphadenopathy:     Cervical: No cervical adenopathy.  Skin:    General: Skin is warm and dry.     Coloration: Skin is not jaundiced.      Findings: No rash.  Neurological:     Mental Status: She is alert and oriented to person, place, and time.     Cranial Nerves: No cranial nerve deficit.  Psychiatric:        Mood and Affect: Mood normal.        Behavior: Behavior normal.        Thought Content: Thought content normal.     LABS:      Latest Ref Rng & Units 09/04/2023    9:39 AM 08/14/2023    9:07 AM 07/22/2023   10:30 AM  CBC  WBC 4.0 - 10.5 K/uL 8.8  8.9  12.1   Hemoglobin 12.0 - 15.0 g/dL 84.9  85.5  85.4   Hematocrit 36.0 - 46.0 % 43.0  42.6  42.8   Platelets 150 - 400 K/uL 275  263  306  Latest Ref Rng & Units 09/04/2023    9:39 AM 08/14/2023    9:07 AM 07/22/2023   10:30 AM  CMP  Glucose 70 - 99 mg/dL 95  895  86   BUN 8 - 23 mg/dL 17  18  15    Creatinine 0.44 - 1.00 mg/dL 9.15  9.09  9.17   Sodium 135 - 145 mmol/L 138  140  141   Potassium 3.5 - 5.1 mmol/L 3.9  4.2  3.8   Chloride 98 - 111 mmol/L 103  103  106   CO2 22 - 32 mmol/L 25  26  27    Calcium 8.9 - 10.3 mg/dL 9.7  9.6  9.3   Total Protein 6.5 - 8.1 g/dL 7.9  7.7  7.0   Total Bilirubin 0.0 - 1.2 mg/dL 2.1  1.8  1.7   Alkaline Phos 38 - 126 U/L 90  79  74   AST 15 - 41 U/L 15  17  13    ALT 0 - 44 U/L 8  10  7       Lab Results  Component Value Date   CEA1 5.3 (H) 10/17/2022   CEA 4.2 01/13/2023   /  CEA  Date Value Ref Range Status  01/13/2023 4.2  Final  10/17/2022 5.3 (H) 0.0 - 4.7 ng/mL Final    Comment:    (NOTE)                             Nonsmokers          <3.9                             Smokers             <5.6 [employer] Electrochemiluminescence Immunoassay (ECLIA) Values obtained with different assay methods or kits cannot be used interchangeably.  Results cannot be interpreted as absolute evidence of the presence or absence of malignant disease. Performed At: [facility] [address] [place], [place] [id number] Jennette Shorter MD Ey:[phone]     Lab Results  Component Value Date   LDH 130  11/28/2022    STUDIES:   MR Brain W Wo Contrast Result Date: 08/21/2023 CLINICAL DATA:  Provided history: Malignant neoplasm of unspecified part of unspecified bronchus or lung. Non-small cell lung cancer, staging. Foot drop. EXAM: MRI HEAD WITHOUT AND WITH CONTRAST TECHNIQUE: Multiplanar, multiecho pulse sequences of the brain and surrounding structures were obtained without and with intravenous contrast. CONTRAST:  4mL GADAVIST  GADOBUTROL  1 MMOL/ML IV SOLN COMPARISON:  Brain MRI 04/06/2020. FINDINGS: Brain: No age-advanced or lobar predominant cerebral atrophy. Few tiny nonspecific foci of T2 FLAIR hyperintense signal abnormality scattered within the cerebral white matter, not unexpected for age. No cortical encephalomalacia is identified. There is no acute infarct. No evidence of an intracranial mass. No chronic intracranial blood products. No extra-axial fluid collection. No midline shift. No pathologic intracranial enhancement identified. Vascular: The non-dominant left vertebral artery is developmentally diminutive intracranially. Flow voids preserved elsewhere within the proximal large arterial vessels. Skull and upper cervical spine: No focal worrisome marrow lesion. Sinuses/Orbits: No mass or acute finding within the imaged orbits. Tiny mucous retention cysts within the bilateral maxillary sinuses. Mild mucosal thickening within the left sphenoid and bilateral ethmoid sinuses. IMPRESSION: 1. No evidence of intracranial metastatic disease. 2. Unremarkable MRI appearance of the brain for age. 3. Mild paranasal sinus  disease as described. Electronically Signed   By: Rockey Childs D.O.   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 Report date is 12/12/2020.     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   Leukocytosis 01/17/2022   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) 02/11/2023   Multiple bilateral pulmonary nodules up to 7 mm in diameter, increased in size and number from prior study of August 2024     Migraines    Nodule of left lung 11/21/2020   Non-small cell lung cancer, left (HCC) 01/15/2021   Port-A-Cath in place 09/05/2022   Tobacco use 12/07/2020    Past Surgical History:  Procedure Laterality Date   ABDOMINAL HYSTERECTOMY  1989   unsure of vaginal or abdominal   BRONCHIAL BIOPSY  01/15/2021   Procedure: BRONCHIAL BIOPSIES;  Surgeon: Shelah Lamar RAMAN, MD;  Location: [facility] ENDOSCOPY;  Service: Pulmonary;;   BRONCHIAL BRUSHINGS  01/15/2021   Procedure: BRONCHIAL BRUSHINGS;  Surgeon: Shelah Lamar RAMAN, MD;  Location: [facility] ENDOSCOPY;  Service: Pulmonary;;   BRONCHIAL NEEDLE ASPIRATION BIOPSY  01/15/2021   Procedure: BRONCHIAL NEEDLE ASPIRATION BIOPSIES;  Surgeon: Shelah Lamar RAMAN, MD;  Location: [facility] ENDOSCOPY;  Service: Pulmonary;;   COLONOSCOPY  2022   FIDUCIAL MARKER PLACEMENT  01/15/2021   Procedure: FIDUCIAL  MARKER PLACEMENT;  Surgeon: Shelah Lamar RAMAN, MD;  Location: [facility] ENDOSCOPY;  Service: Pulmonary;;   ovaries removed  1986   surgery to remove scar tissue     from colon/bladder in patient's late 30's   TUBAL LIGATION  1986   UPPER GI ENDOSCOPY     years ago in patient's late 51s   VIDEO BRONCHOSCOPY WITH RADIAL ENDOBRONCHIAL ULTRASOUND  01/15/2021   Procedure: VIDEO BRONCHOSCOPY WITH RADIAL ENDOBRONCHIAL ULTRASOUND;  Surgeon: Shelah Lamar RAMAN, MD;  Location: [facility] ENDOSCOPY;  Service: Pulmonary;;    Family History  Problem Relation Age of Onset   Pancreatic cancer Maternal Aunt 50   Brain cancer Maternal Uncle    Stomach cancer Maternal Uncle        dx. >50   Prostate cancer Half-Brother        passed away at 35   Prostate cancer Half-Brother     Social History:  reports that she quit smoking about 2 years ago. Her smoking use included cigarettes. She started smoking about 62 years ago. She has a 30.2 pack-year smoking history. She has never used smokeless  tobacco. She reports current alcohol use of about 4.0 standard drinks of alcohol per week. She reports current drug use. Frequency: 3.00 times per week. Drug: Marijuana.The patient is alone today.  Allergies:  Allergies  Allergen Reactions   Codeine Rash   Demerol [Meperidine Hcl] Other (See Comments)    coma   Meperidine Other (See Comments)    Several days will pass without patient being aware  Several days will pass without patient being aware    coma   Other Dermatitis    Stainless steel / skin weeps   Silver Dermatitis    Sterling silver skin weeps   Gold Bond [Menthol-Zinc Oxide] Dermatitis    Skin weeps   Neosporin [Bacitracin-Polymyxin B] Dermatitis    Skin weeps   Wound Dressing Adhesive Other (See Comments)    CAUSES SKIN TEARS PER PATIENT. INSTEAD USE THE TEGADERM 67M WITH ADHESIVE FREE WINDOW OVER PORT.   Wound Dressings Other (See Comments)    CAUSES SKIN TEARS PER PATIENT. INSTEAD USE THE TEGADERM 67M WITH  ADHESIVE FREE WINDOW OVER PORT.    Current Medications: Current Outpatient Medications  Medication Sig Dispense Refill   aspirin 81 MG EC tablet Take 81 mg by mouth daily.     atorvastatin (LIPITOR) 40 MG tablet Take 40 mg by mouth daily.     azithromycin  (ZITHROMAX ) 250 MG tablet Take 2 tabs today, then 1 tab daily until finished 6 each 0   celecoxib (CELEBREX) 200 MG capsule Take 200 mg by mouth daily.     HYDROcodone -acetaminophen  (NORCO/VICODIN) 5-325 MG tablet Take 1 tablet by mouth every 6 (six) hours as needed for moderate pain (pain score 4-6) or severe pain (pain score 7-10). 30 tablet 0   levothyroxine  (SYNTHROID ) 75 MCG tablet TAKE 1 TABLET(75 MCG) BY MOUTH DAILY 30 tablet 5   lidocaine -prilocaine  (EMLA ) cream Apply 1 Application topically as needed. 30 g 0   LORazepam  (ATIVAN ) 1 MG tablet Take 1 tablet (1 mg total) by mouth every 8 (eight) hours. Use 1-2 tablets by mouth prior to MRI scan 30 tablet 0   ondansetron  (ZOFRAN ) 4 MG tablet Take 1 tablet (4 mg total) by mouth every 4 (four) hours as needed for nausea. 90 tablet 3   pantoprazole  (PROTONIX ) 40 MG tablet TAKE 1 TABLET(40 MG) BY MOUTH DAILY 60 tablet 1   Prenatal Vit-Fe Fumarate-FA (PRENATAL VITAMIN) 27-0.8 MG TABS Take 1 tablet by mouth daily. 30 tablet 5   prochlorperazine  (COMPAZINE ) 10 MG tablet TAKE 1 TABLET(10 MG) BY MOUTH EVERY 6 HOURS AS NEEDED FOR NAUSEA OR VOMITING 360 tablet 0   VENTOLIN  HFA 108 (90 Base) MCG/ACT inhaler Inhale 1-2 puffs into the lungs as needed for wheezing or shortness of breath.     No current facility-administered medications for this visit.   Facility-Administered Medications Ordered in Other Visits  Medication Dose Route Frequency Provider Last Rate Last Admin   sodium chloride  flush (NS) 0.9 % injection 10 mL  10 mL Intracatheter PRN Cornelius Wanda DEL, MD   10 mL at 08/21/23 1042

## 2023-09-05 ENCOUNTER — Inpatient Hospital Stay

## 2023-09-05 VITALS — BP 171/88 | HR 61 | Temp 98.7°F | Resp 18 | Ht 59.0 in | Wt 94.4 lb

## 2023-09-05 DIAGNOSIS — Z5112 Encounter for antineoplastic immunotherapy: Secondary | ICD-10-CM | POA: Diagnosis not present

## 2023-09-05 DIAGNOSIS — C3492 Malignant neoplasm of unspecified part of left bronchus or lung: Secondary | ICD-10-CM

## 2023-09-05 LAB — T4: T4, Total: 9.6 ug/dL (ref 4.5–12.0)

## 2023-09-05 MED ORDER — SODIUM CHLORIDE 0.9 % IV SOLN: INTRAVENOUS | Status: DC

## 2023-09-05 MED ORDER — SODIUM CHLORIDE 0.9 % IV SOLN
200.0000 mg | Freq: Once | INTRAVENOUS | Status: AC
  Administered 2023-09-05: 200 mg via INTRAVENOUS
  Filled 2023-09-05: qty 8

## 2023-09-05 NOTE — Patient Instructions (Signed)
 Pembrolizumab  Injection What is this medication? PEMBROLIZUMAB  (PEM broe LIZ ue mab) treats some types of cancer. It works by helping your immune system slow or stop the spread of cancer cells. It is a monoclonal antibody. This medicine may be used for other purposes; ask your health care provider or pharmacist if you have questions. COMMON BRAND NAME(S): Keytruda  What should I tell my care team before I take this medication? They need to know if you have any of these conditions: Allogeneic stem cell transplant (uses someone else's stem cells) Autoimmune diseases, such as Crohn disease, ulcerative colitis, lupus History of chest radiation Nervous system problems, such as Guillain-Barre syndrome, myasthenia gravis Organ transplant An unusual or allergic reaction to pembrolizumab , other medications, foods, dyes, or preservatives Pregnant or trying to get pregnant Breast-feeding How should I use this medication? This medication is injected into a vein. It is given by your care team in a hospital or clinic setting. A special MedGuide will be given to you before each treatment. Be sure to read this information carefully each time. Talk to your care team about the use of this medication in children. While it may be prescribed for children as young as 6 months for selected conditions, precautions do apply. Overdosage: If you think you have taken too much of this medicine contact a poison control center or emergency room at once. NOTE: This medicine is only for you. Do not share this medicine with others. What if I miss a dose? Keep appointments for follow-up doses. It is important not to miss your dose. Call your care team if you are unable to keep an appointment. What may interact with this medication? Interactions have not been studied. This list may not describe all possible interactions. Give your health care provider a list of all the medicines, herbs, non-prescription drugs, or dietary  supplements you use. Also tell them if you smoke, drink alcohol, or use illegal drugs. Some items may interact with your medicine. What should I watch for while using this medication? Your condition will be monitored carefully while you are receiving this medication. You may need blood work while taking this medication. This medication may cause serious skin reactions. They can happen weeks to months after starting the medication. Contact your care team right away if you notice fevers or flu-like symptoms with a rash. The rash may be red or purple and then turn into blisters or peeling of the skin. You may also notice a red rash with swelling of the face, lips, or lymph nodes in your neck or under your arms. Tell your care team right away if you have any change in your eyesight. Talk to your care team if you may be pregnant. Serious birth defects can occur if you take this medication during pregnancy and for 4 months after the last dose. You will need a negative pregnancy test before starting this medication. Contraception is recommended while taking this medication and for 4 months after the last dose. Your care team can help you find the option that works for you. Do not breastfeed while taking this medication and for 4 months after the last dose. What side effects may I notice from receiving this medication? Side effects that you should report to your care team as soon as possible: Allergic reactions--skin rash, itching, hives, swelling of the face, lips, tongue, or throat Dry cough, shortness of breath or trouble breathing Eye pain, redness, irritation, or discharge with blurry or decreased vision Heart muscle inflammation--unusual weakness or  fatigue, shortness of breath, chest pain, fast or irregular heartbeat, dizziness, swelling of the ankles, feet, or hands Hormone gland problems--headache, sensitivity to light, unusual weakness or fatigue, dizziness, fast or irregular heartbeat, increased  sensitivity to cold or heat, excessive sweating, constipation, hair loss, increased thirst or amount of urine, tremors or shaking, irritability Infusion reactions--chest pain, shortness of breath or trouble breathing, feeling faint or lightheaded Kidney injury (glomerulonephritis)--decrease in the amount of urine, red or dark brown urine, foamy or bubbly urine, swelling of the ankles, hands, or feet Liver injury--right upper belly pain, loss of appetite, nausea, light-colored stool, dark yellow or brown urine, yellowing skin or eyes, unusual weakness or fatigue Pain, tingling, or numbness in the hands or feet, muscle weakness, change in vision, confusion or trouble speaking, loss of balance or coordination, trouble walking, seizures Rash, fever, and swollen lymph nodes Redness, blistering, peeling, or loosening of the skin, including inside the mouth Sudden or severe stomach pain, bloody diarrhea, fever, nausea, vomiting Side effects that usually do not require medical attention (report to your care team if they continue or are bothersome): Bone, joint, or muscle pain Diarrhea Fatigue Loss of appetite Nausea Skin rash This list may not describe all possible side effects. Call your doctor for medical advice about side effects. You may report side effects to FDA at 1-800-FDA-1088. Where should I keep my medication? This medication is given in a hospital or clinic. It will not be stored at home. NOTE: This sheet is a summary. It may not cover all possible information. If you have questions about this medicine, talk to your doctor, pharmacist, or health care provider.  2024 Elsevier/Gold Standard (2021-05-29 00:00:00)[facility]  - A DEPT OF [facility]  Discharge Instructions: Thank you for choosing [facility] to provide your oncology and hematology care.  If you have a lab appointment with the Cancer Center, please go directly to the Cancer Center and  check in at the registration area.   Wear comfortable clothing and clothing appropriate for easy access to any Portacath or PICC line.   We strive to give you quality time with your provider. You may need to reschedule your appointment if you arrive late (15 or more minutes).  Arriving late affects you and other patients whose appointments are after yours.  Also, if you miss three or more appointments without notifying the office, you may be dismissed from the clinic at the provider's discretion.      For prescription refill requests, have your pharmacy contact our office and allow 72 hours for refills to be completed.    Today you received the following chemotherapy and/or immunotherapy agents pembrolizumab       To help prevent nausea and vomiting after your treatment, we encourage you to take your nausea medication as directed.  BELOW ARE SYMPTOMS THAT SHOULD BE REPORTED IMMEDIATELY: *FEVER GREATER THAN 100.4 F (38 C) OR HIGHER *CHILLS OR SWEATING *NAUSEA AND VOMITING THAT IS NOT CONTROLLED WITH YOUR NAUSEA MEDICATION *UNUSUAL SHORTNESS OF BREATH *UNUSUAL BRUISING OR BLEEDING *URINARY PROBLEMS (pain or burning when urinating, or frequent urination) *BOWEL PROBLEMS (unusual diarrhea, constipation, pain near the anus) TENDERNESS IN MOUTH AND THROAT WITH OR WITHOUT PRESENCE OF ULCERS (sore throat, sores in mouth, or a toothache) UNUSUAL RASH, SWELLING OR PAIN  UNUSUAL VAGINAL DISCHARGE OR ITCHING   Items with * indicate a potential emergency and should be followed up as soon as possible or go to the Emergency Department if any  problems should occur.  Please show the CHEMOTHERAPY ALERT CARD or IMMUNOTHERAPY ALERT CARD at check-in to the Emergency Department and triage nurse.  Should you have questions after your visit or need to cancel or reschedule your appointment, please contact [facility] [place] - A DEPT OF [facility]  Dept: [phone]  and follow the  prompts.  Office hours are 8:00 a.m. to 4:30 p.m. Monday - Friday. Please note that voicemails left after 4:00 p.m. may not be returned until the following business day.  We are closed weekends and major holidays. You have access to a nurse at all times for urgent questions. Please call the main number to the clinic Dept: [phone] and follow the prompts.  For any non-urgent questions, you may also contact your provider using MyChart. We now offer e-Visits for anyone 71 and older to request care online for non-urgent symptoms. For details visit [email or web address].   Also download the MyChart app! Go to the app store, search MyChart, open the app, select [place], and log in with your MyChart username and password.

## 2023-09-05 NOTE — Progress Notes (Signed)
 Ok to proceed with pembrolizumab  despite cough, fatigue and bili = 1.8 per Kelli.

## 2023-09-08 ENCOUNTER — Encounter: Payer: Self-pay | Admitting: Hematology and Oncology

## 2023-09-08 ENCOUNTER — Encounter: Payer: Self-pay | Admitting: Oncology

## 2023-09-08 NOTE — Assessment & Plan Note (Signed)
 CT chest in December 2024 revealed multiple bilateral pulmonary nodules which increased in size and number since imaging in August 2024.  These measure up to 7 mm in diameter.  There was an area of pleural plaque in the posteriomedial left upper lobe, measuring 2.9 cm.  Prior PD-L1 was positive at 90%, so she resumed pembrolizumab  in January.    She will proceed with a 11th cycle of pembrolizumab  tomorrow.  We will plan to see her back in 3 weeks with a CBC, comprehensive metabolic panel and TSH prior to a 12th cycle.

## 2023-09-08 NOTE — Assessment & Plan Note (Signed)
 The TSH has fluctuated up and down.  She she is currently on levothyroxine  75 mcg daily.  Her last TSH was had increased to 15, no adjustments in her medication were made.  TSH is pending from today.

## 2023-09-08 NOTE — Assessment & Plan Note (Signed)
 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  We will proceed with pembrolizumab  and continue to monitor this.

## 2023-09-08 NOTE — Assessment & Plan Note (Addendum)
 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  She was treated with left lower lobectomy in [place] .  Her doctor there recommended chemotherapy with immunotherapy, but she refused chemotherapy.  PD-L1 was positive.  She was only willing to take the immunotherapy for 1 year. She started pembrolizumab  in August 2023.  We saw her in October 2023, when she returned to [place]  and continued pembrolizumab  every 21 days. Routine CT imaging had not shown any evidence of recurrence.  She completed 17 cycles of adjuvant pembrolizumab  in August and CT chest following that was negative for recurrence. There were stable scattered small bilateral pulmonary nodules, felt to be from benign emphysema.  Surgical changes were seen in the left lower lobe.   Unfortunately, she had evidence of recurrence on CT chest in December 2024 with interval progression in the size and number of multiple bilateral pulmonary nodules most consistent with progression of metastatic disease. We decided not to pursue a biopsy in view of the difficulty involved. Treatment options were discussed and she wished not to pursue chemotherapy unless absolutely necessary.  She was placed back on pembrolizumab .  Repeat CT imaging in June revealed very mild progression with some increased mediastinal and bilateral hilar lymphadenopathy. There were multiple bilateral pulmonary nodules measuring up to 8 mm in size, so just mildly progressive. There was a small left pleural effusion with mild pleural thickening. After discussion with the patient, we opted to continue immunotherapy due to the extremely slow progression of her disease.  She wishes to avoid IV chemotherapy if possible.  She understands chemotherapy probably will be necessary in the future.   She has a congested cough and has lost weight.  I will place her on azithromycin .  I encouraged her to use nutritional supplements.  I will ask our dietician to touch base with  her.  She will proceed with her 11th cycle of pembrolizumab  tomorrow.  We will plan to see her back in 3 weeks with a CBC, comprehensive metabolic panel and TSH prior to her a 12th cycle of pembrolizumab .

## 2023-09-10 ENCOUNTER — Inpatient Hospital Stay: Admitting: Dietician

## 2023-09-10 NOTE — Progress Notes (Signed)
 Nutrition Assessment  Reached out to patient at home telephone#.  Reason for Assessment: PA-C referral for weight loss.    ASSESSMENT: Patient is a 63 year old female with stage IIB adenocarcinoma of the left lung.  She has refused nutrition services in past as she desired to lose weight.  Patient states she was always thin in her youth with UBW 89-96# in younger years. She is size she wants to be and clothes that she couldn't fit into for years are fitting now.  She states she's eating the way she should. Made herself egg and grits this morning, and she take a prenatal vitamin daily to aid in nutrient intake.  Previous MD relayed that as long as she stays above 90# not to be concerned.  Anthropometrics: weight loss last month rebounded.  Height: 59 Weight: 94.4# UBW: intentionally trying to lose lately. BMI: 19.07    NUTRITION DIAGNOSIS:Food and Nutrition Related Knowledge Deficit related to cancer and associated treatments as evidenced by no prior need for nutrition related information.    INTERVENTION:  Relayed that nutrition services are wrap around service provided at no charge and encouraged continued communication if experiencing continued weight loss or any nutritional impact symptoms (NIS).  Encouraged varied meals and protein pacing.  Stressed importance of maintaining LBM and weight.  Encouraged patient to seek ONS samples if she can not maintain weight.  MONITORING, EVALUATION, GOAL: weight, PO intake, Nutrition Impact Symptoms, labs Goal is weight maintenance  Next Visit: PRN at patient or provider request  Micheline Craven, RDN, LDN Registered Dietitian, [facility] Part Time Remote (Usual office hours: Tuesday-Thursday) Cell: [phone]

## 2023-09-25 ENCOUNTER — Encounter: Payer: Self-pay | Admitting: Hematology and Oncology

## 2023-09-25 ENCOUNTER — Encounter: Payer: Self-pay | Admitting: Oncology

## 2023-09-25 ENCOUNTER — Inpatient Hospital Stay

## 2023-09-25 ENCOUNTER — Telehealth: Payer: Self-pay | Admitting: Hematology and Oncology

## 2023-09-25 ENCOUNTER — Inpatient Hospital Stay (HOSPITAL_BASED_OUTPATIENT_CLINIC_OR_DEPARTMENT_OTHER): Admitting: Hematology and Oncology

## 2023-09-25 ENCOUNTER — Other Ambulatory Visit: Payer: Self-pay | Admitting: Hematology and Oncology

## 2023-09-25 VITALS — BP 130/97 | HR 65 | Temp 97.8°F | Resp 18 | Ht 59.0 in | Wt 93.2 lb

## 2023-09-25 DIAGNOSIS — C3492 Malignant neoplasm of unspecified part of left bronchus or lung: Secondary | ICD-10-CM

## 2023-09-25 DIAGNOSIS — Z5112 Encounter for antineoplastic immunotherapy: Secondary | ICD-10-CM | POA: Diagnosis not present

## 2023-09-25 LAB — CBC WITH DIFFERENTIAL (CANCER CENTER ONLY)
Abs Immature Granulocytes: 0.01 K/uL (ref 0.00–0.07)
Basophils Absolute: 0.3 K/uL — ABNORMAL HIGH (ref 0.0–0.1)
Basophils Relative: 3 %
Eosinophils Absolute: 1.8 K/uL — ABNORMAL HIGH (ref 0.0–0.5)
Eosinophils Relative: 21 %
HCT: 42.6 % (ref 36.0–46.0)
Hemoglobin: 14.1 g/dL (ref 12.0–15.0)
Immature Granulocytes: 0 %
Lymphocytes Relative: 31 %
Lymphs Abs: 2.7 K/uL (ref 0.7–4.0)
MCH: 31.5 pg (ref 26.0–34.0)
MCHC: 33.1 g/dL (ref 30.0–36.0)
MCV: 95.3 fL (ref 80.0–100.0)
Monocytes Absolute: 0.8 K/uL (ref 0.1–1.0)
Monocytes Relative: 9 %
Neutro Abs: 3.1 K/uL (ref 1.7–7.7)
Neutrophils Relative %: 36 %
Platelet Count: 232 K/uL (ref 150–400)
RBC: 4.47 MIL/uL (ref 3.87–5.11)
RDW: 12.8 % (ref 11.5–15.5)
WBC Count: 8.6 K/uL (ref 4.0–10.5)
nRBC: 0 % (ref 0.0–0.2)

## 2023-09-25 LAB — CMP (CANCER CENTER ONLY)
ALT: 7 U/L (ref 0–44)
AST: 14 U/L — ABNORMAL LOW (ref 15–41)
Albumin: 3.9 g/dL (ref 3.5–5.0)
Alkaline Phosphatase: 68 U/L (ref 38–126)
Anion gap: 11 (ref 5–15)
BUN: 14 mg/dL (ref 8–23)
CO2: 26 mmol/L (ref 22–32)
Calcium: 9.7 mg/dL (ref 8.9–10.3)
Chloride: 101 mmol/L (ref 98–111)
Creatinine: 0.73 mg/dL (ref 0.44–1.00)
GFR, Estimated: >60 mL/min (ref >60)
Glucose, Bld: 89 mg/dL (ref 70–99)
Potassium: 4.1 mmol/L (ref 3.5–5.1)
Sodium: 138 mmol/L (ref 135–145)
Total Bilirubin: 1.7 mg/dL — ABNORMAL HIGH (ref 0.0–1.2)
Total Protein: 7.5 g/dL (ref 6.5–8.1)

## 2023-09-25 LAB — TSH: TSH: 1.68 u[IU]/mL (ref 0.350–4.500)

## 2023-09-25 NOTE — Progress Notes (Signed)
 [facility]  [address] [place],  [place]  [address] [phone]  Clinic Day:  09/04/2023  Referring physician: Jama Chow, MD  ASSESSMENT & PLAN:   Assessment & Plan: Non-small cell lung cancer, left (HCC) Stage IIB adenocarcinoma of the left lung diagnosed in December 2022.  She was treated with left lower lobectomy in [place] .  Her doctor there recommended chemotherapy with immunotherapy, but she refused chemotherapy.  PD-L1 was positive.  She was only willing to take the immunotherapy for 1 year. She started pembrolizumab  in August 2023.  We saw her in October 2023, when she returned to [place]  and continued pembrolizumab  every 21 days. Routine CT imaging had not shown any evidence of recurrence.  She completed 17 cycles of adjuvant pembrolizumab  in August and CT chest following that was negative for recurrence. There were stable scattered small bilateral pulmonary nodules, felt to be from benign emphysema.  Surgical changes were seen in the left lower lobe.    Unfortunately, she had evidence of recurrence on CT chest in December 2024 with interval progression in the size and number of multiple bilateral pulmonary nodules most consistent with progression of metastatic disease. We decided not to pursue a biopsy in view of the difficulty involved. Treatment options were discussed and she wished not to pursue chemotherapy unless absolutely necessary.  She was placed back on pembrolizumab .   Repeat CT imaging in June revealed very mild progression with some increased mediastinal and bilateral hilar lymphadenopathy. There were multiple bilateral pulmonary nodules measuring up to 8 mm in size, so just mildly progressive. There was a small left pleural effusion with mild pleural thickening. After discussion with the patient, we opted to continue immunotherapy due to the extremely slow progression of her disease.  She wishes to avoid IV chemotherapy if possible.  She  understands chemotherapy probably will be necessary in the future.    She has a congested cough and has lost weight.  I will place her on azithromycin .  I encouraged her to use nutritional supplements.  I will ask our dietician to touch base with her.  She will proceed with her 12th cycle of pembrolizumab  tomorrow.  We will plan to see her back in 3 weeks with a CBC, comprehensive metabolic panel and TSH prior to her a 13th cycle of pembrolizumab .   Malignant neoplasm metastatic to lung [facility]) CT chest in December 2024 revealed multiple bilateral pulmonary nodules which increased in size and number since imaging in August 2024.  These measure up to 7 mm in diameter.  There was an area of pleural plaque in the posteriomedial left upper lobe, measuring 2.9 cm.  Prior PD-L1 was positive at 90%, so she resumed pembrolizumab  in January.     She will proceed with a 11th cycle of pembrolizumab  tomorrow.  We will plan to see her back in 3 weeks with a CBC, comprehensive metabolic panel and TSH prior to a 12th cycle.    Hypothyroidism due to medication The TSH has fluctuated up and down.  She she is currently on levothyroxine  75 mcg daily.  Her last TSH was had increased to 15, no adjustments in her medication were made.  TSH is pending from today.   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  We will proceed with pembrolizumab  and continue to monitor this.  The patient understands the plans discussed today and is in agreement with them.  She knows to contact our office if she develops concerns prior to her next appointment.   I provided 20 minutes of face-to-face time during this encounter and > 50% was spent counseling as documented under my assessment and plan.    Sydney DELENA Bach, NP  [facility] - A DEPT OF . [facility] [address] [place] [address] Dept: [phone] Dept Fax: [phone]   Orders Placed This Encounter  Procedures   CT Chest W Contrast    Standing Status:   Future    Number of Occurrences:   1    Expected Date:   10/16/2023    Expiration Date:   09/24/2024    If indicated for the ordered procedure, I authorize the administration of contrast media per Radiology protocol:   Yes    Does the patient have a contrast media/X-ray dye allergy?:   No    Preferred imaging location?:   [facility]      CHIEF COMPLAINT:  CC: Metastatic non-small cell lung cancer  Current Treatment: Pembrolizumab  every 3 weeks  HISTORY OF PRESENT ILLNESS:   Oncology History  Non-small cell lung cancer, left (HCC)  01/15/2021 Initial Diagnosis   Non-small cell lung cancer, left (HCC)   02/12/2021 Cancer Staging   Staging form: Lung, AJCC 8th Edition - Clinical stage from 02/12/2021: Stage IA2 (cT1b, cN0, cM0) - Signed by Cornelius Wanda DEL, MD on 02/14/2021 Histopathologic type: Adenocarcinoma, NOS Stage prefix: Initial diagnosis Histologic grade (G): GX Histologic grading system: 4 grade system Laterality: Left Tumor size (mm): 19 Lymph-vascular invasion (LVI): LVI not present (absent)/not identified Diagnostic confirmation: Positive histology Specimen type: Bronchial Biopsy Staged by: Managing physician Stage used in treatment planning: Yes National guidelines used in treatment planning: Yes Type of national guideline used in treatment planning: NCCN Staging comments: Rec surgical resection   05/11/2021 Cancer Staging   Staging form: Lung, AJCC 8th Edition - Pathologic stage from 05/11/2021: Stage IIB (ypT1c, pN1, cM0) - Signed by Cornelius Wanda DEL, MD on 11/12/2021 Histopathologic type: Adenocarcinoma, NOS Stage prefix: Post-therapy Histologic grade (G): G2 Histologic grading system: 4 grade system Residual tumor (R): R0 - None Laterality: Left Tumor size  (mm): 25 Lymph-vascular invasion (LVI): LVI not present (absent)/not identified Diagnostic confirmation: Positive histology PLUS positive immunophenotyping and/or positive genetic studies Specimen type: Excision Staged by: Managing physician Type of lung cancer: Surgically resected non-small cell lung cancer ECOG performance status: Grade 1 Perineural invasion (PNI): Absent Pleural/elastic layer invasion: PL0 Pleural lavage cytology: Unknown Weight loss: Absent Adequacy of mediastinal dissection: Adequate Radiotherapy dose: No Adjuvant radiation: No Adjuvant chemotherapy: Yes Stage used in treatment planning: Yes National guidelines used in treatment planning: Yes Type of national guideline used in treatment planning: NCCN Staging comments: Adjuvant pembrolizumab , PDL 1 90%   10/04/2021 - 10/01/2022 Chemotherapy   Patient is on Treatment Plan : LUNG NSCLC Pembrolizumab  Adj q21d x 18 cycles     02/04/2023 -  Chemotherapy   Patient is on Treatment Plan : LUNG NSCLC Pembrolizumab  (200) q21d         INTERVAL HISTORY:   Sydney Hunt is here today for repeat clinical assessment prior to continuing pembrolizumab .  She reports a congested cough productive of clear sputum.  She denies progressive shortness of breath.  She denies fevers or chills. She denies pain. Her appetite is decreased. Her weight has decreased 9  pounds over last 3 weeks.  She quit smoking again last week.  REVIEW OF SYSTEMS:   Review of Systems  Constitutional:  Positive for appetite change and unexpected weight change. Negative for chills, fatigue and fever.  HENT:   Negative for lump/mass, mouth sores and sore throat.   Respiratory:  Positive for cough and shortness of breath (with exertion). Negative for hemoptysis.   Cardiovascular:  Negative for chest pain and leg swelling.  Gastrointestinal:  Negative for abdominal pain, constipation, diarrhea, nausea and vomiting.  Endocrine: Negative for hot flashes.  Genitourinary:   Negative for difficulty urinating, dysuria, frequency and hematuria.   Musculoskeletal:  Negative for arthralgias, back pain, gait problem and myalgias.  Skin:  Negative for rash.  Neurological:  Negative for dizziness, extremity weakness, gait problem, headaches and light-headedness.  Hematological:  Negative for adenopathy. Does not bruise/bleed easily.  Psychiatric/Behavioral:  Negative for depression and sleep disturbance. The patient is not nervous/anxious.      VITALS:   Blood pressure (!) 130/97, pulse 65, temperature 97.8 F (36.6 C), temperature source Oral, resp. rate 18, height 4' 11 (1.499 m), weight 93 lb 3.2 oz (42.3 kg), SpO2 96%.  Wt Readings from Last 3 Encounters:  11/10/23 93 lb (42.2 kg)  11/06/23 92 lb 12.8 oz (42.1 kg)  10/17/23 94 lb 8 oz (42.9 kg)    Body mass index is 18.82 kg/m.  Performance status (ECOG): 1 - Symptomatic but completely ambulatory    PHYSICAL EXAM:   Physical Exam Vitals and nursing note reviewed.  Constitutional:      General: She is not in acute distress.    Appearance: Normal appearance.  HENT:     Head: Normocephalic and atraumatic.     Mouth/Throat:     Mouth: Mucous membranes are moist.     Pharynx: Oropharynx is clear. No oropharyngeal exudate or posterior oropharyngeal erythema.  Eyes:     General: No scleral icterus.    Extraocular Movements: Extraocular movements intact.     Conjunctiva/sclera: Conjunctivae normal.     Pupils: Pupils are equal, round, and reactive to light.  Cardiovascular:     Rate and Rhythm: Normal rate and regular rhythm.     Heart sounds: Normal heart sounds. No murmur heard.    No friction rub. No gallop.  Pulmonary:     Effort: Pulmonary effort is normal.     Breath sounds: Normal breath sounds. Transmitted upper airway sounds present. No wheezing, rhonchi or rales.  Abdominal:     General: There is no distension.     Palpations: Abdomen is soft. There is no mass.     Tenderness: There is  no abdominal tenderness.  Musculoskeletal:        General: Normal range of motion.     Cervical back: Normal range of motion and neck supple. No tenderness.     Right lower leg: No edema.     Left lower leg: No edema.  Lymphadenopathy:     Cervical: No cervical adenopathy.  Skin:    General: Skin is warm and dry.     Coloration: Skin is not jaundiced.     Findings: No rash.  Neurological:     Mental Status: She is alert and oriented to person, place, and time.     Cranial Nerves: No cranial nerve deficit.  Psychiatric:        Mood and Affect: Mood normal.        Behavior: Behavior normal.  Thought Content: Thought content normal.     LABS:      Latest Ref Rng & Units 11/06/2023    2:16 PM 10/16/2023    1:40 PM 09/25/2023    9:29 AM  CBC  WBC 4.0 - 10.5 K/uL 9.7  10.2  8.6   Hemoglobin 12.0 - 15.0 g/dL 84.8  86.0  85.8   Hematocrit 36.0 - 46.0 % 44.7  40.9  42.6   Platelets 150 - 400 K/uL 236  278  232       Latest Ref Rng & Units 11/06/2023    2:16 PM 10/16/2023    1:40 PM 09/25/2023    9:29 AM  CMP  Glucose 70 - 99 mg/dL 862  846  89   BUN 8 - 23 mg/dL 13  14  14    Creatinine 0.44 - 1.00 mg/dL 9.19  9.17  9.26   Sodium 135 - 145 mmol/L 137  138  138   Potassium 3.5 - 5.1 mmol/L 3.7  3.5  4.1   Chloride 98 - 111 mmol/L 102  101  101   CO2 22 - 32 mmol/L 27  27  26    Calcium 8.9 - 10.3 mg/dL 9.5  9.2  9.7   Total Protein 6.5 - 8.1 g/dL 8.0  7.2  7.5   Total Bilirubin 0.0 - 1.2 mg/dL 1.4  1.2  1.7   Alkaline Phos 38 - 126 U/L 101  80  68   AST 15 - 41 U/L 17  16  14    ALT 0 - 44 U/L 11  8  7       Lab Results  Component Value Date   CEA1 5.3 (H) 10/17/2022   CEA 4.2 01/13/2023   /  CEA  Date Value Ref Range Status  01/13/2023 4.2  Final  10/17/2022 5.3 (H) 0.0 - 4.7 ng/mL Final    Comment:    (NOTE)                             Nonsmokers          <3.9                             Smokers             <5.6 [employer] Electrochemiluminescence  Immunoassay (ECLIA) Values obtained with different assay methods or kits cannot be used interchangeably.  Results cannot be interpreted as absolute evidence of the presence or absence of malignant disease. Performed At: [facility] [address] [place], [place] [id number] Jennette Shorter MD Ey:[phone]     Lab Results  Component Value Date   LDH 130 11/28/2022    STUDIES:   CT Chest W Contrast Result Date: 10/16/2023 CLINICAL DATA:  Lung cancer. Non-small cell lung cancer. Ongoing treatment. * Tracking Code: BO * EXAM: CT CHEST WITH CONTRAST TECHNIQUE: Multidetector CT imaging of the chest was performed during intravenous contrast administration. RADIATION DOSE REDUCTION: This exam was performed according to the departmental dose-optimization program which includes automated exposure control, adjustment of the mA and/or kV according to patient size and/or use of iterative reconstruction technique. CONTRAST:  75mL OMNIPAQUE  IOHEXOL  300 MG/ML  SOLN COMPARISON:  CT scan chest from 07/10/2023. FINDINGS: Cardiovascular: Normal cardiac size. No pericardial effusion. No aortic aneurysm. There are coronary artery calcifications, in keeping with coronary artery disease. There are also mild peripheral  atherosclerotic vascular calcifications of thoracic aorta and its major branches. Mediastinum/Nodes: Visualized thyroid  gland appears grossly unremarkable. No solid / cystic mediastinal masses. The esophagus is nondistended precluding optimal assessment. There are multiple enlarged, heterogeneous mediastinal and bilateral hilar lymph nodes with largest in the subcarinal region measuring up to 17 x 28 mm. These are essentially similar to prior study and favored metastatic in etiology. There are few pericardial lymph nodes, which are stable. There are few bilateral axillary lymph nodes with short axis less than 5 mm, which are indeterminate. Lungs/Pleura: The central tracheo-bronchial tree is  patent. Postsurgical changes from prior left lower lobectomy noted. Redemonstration of multiple (more than 10), nodules throughout bilateral lungs (marked with electronic arrow sign on series 302) which appear essentially similar to the prior study. Several of these nodules are along the bronchovascular bundles and favored to represent intrapulmonary lymph nodes. There is stable focal irregular pleural thickening along the left lower posterior hemithorax. No new mass or consolidation. No pleural effusion or pneumothorax. Moderate upper lobe predominant emphysematous changes noted. There is smooth interlobular and intralobular septal thickening throughout bilateral lungs, suggesting congestive heart failure/pulmonary edema. Upper Abdomen: There is a stable subcentimeter subcapsular hypoattenuating focus in the right hepatic lobe, segment 7, which is too small to adequately characterize. Remaining visualized upper abdominal viscera within normal limits. Musculoskeletal: The visualized soft tissues of the chest wall are grossly unremarkable. No suspicious osseous lesions. Schmorl's node noted along the inferior endplate of L1 vertebrae. There are mild multilevel degenerative changes in the visualized spine. IMPRESSION: 1. Redemonstration of multiple bilateral lung nodules, essentially similar to the prior study. No new lung mass or consolidation. 2. Redemonstration of multiple stable, heterogeneous mediastinal, bilateral hilar and pericardial lymph nodes, favored metastatic. 3. There is smooth interlobular and intralobular septal thickening throughout bilateral lungs, suggesting congestive heart failure/pulmonary edema. 4. Multiple other nonacute observations, as described above. Aortic Atherosclerosis (ICD10-I70.0) and Emphysema (ICD10-J43.9). Electronically Signed   By: Ree Molt M.D.   On: 10/16/2023 15:23       HISTORY:   Past Medical History:  Diagnosis Date   Allergy    Arthritis     Atherosclerotic vascular disease 01/16/2022   Cardiac murmur 01/16/2022   Complication of anesthesia    slow to wake up   COPD (chronic obstructive pulmonary disease) (HCC)    COPD, mild (HCC) 12/15/2020   DOE (dyspnea on exertion) 01/16/2022   Family history of pancreatic cancer 11/28/2020   Family history of prostate cancer 11/28/2020   Family history of stomach cancer 11/28/2020   Genetic testing 12/18/2020   [facility] Multi-Cancer Panel was Negative. Report date is 12/12/2020.     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   Leukocytosis 01/17/2022   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) 02/11/2023   Multiple bilateral pulmonary nodules up to 7 mm in diameter, increased in size and number from prior study of August 2024     Migraines    Nodule of left lung 11/21/2020  Non-small cell lung cancer, left (HCC) 01/15/2021   Port-A-Cath in place 09/05/2022   Tobacco use 12/07/2020    Past Surgical History:  Procedure Laterality Date   ABDOMINAL HYSTERECTOMY  1989   unsure of vaginal or abdominal   BRONCHIAL BIOPSY  01/15/2021   Procedure: BRONCHIAL BIOPSIES;  Surgeon: Shelah Lamar RAMAN, MD;  Location: [facility] ENDOSCOPY;  Service: Pulmonary;;   BRONCHIAL BRUSHINGS  01/15/2021   Procedure: BRONCHIAL BRUSHINGS;  Surgeon: Shelah Lamar RAMAN, MD;  Location: [facility] ENDOSCOPY;  Service: Pulmonary;;   BRONCHIAL NEEDLE ASPIRATION BIOPSY  01/15/2021    Procedure: BRONCHIAL NEEDLE ASPIRATION BIOPSIES;  Surgeon: Shelah Lamar RAMAN, MD;  Location: [facility] ENDOSCOPY;  Service: Pulmonary;;   COLONOSCOPY  2022   FIDUCIAL MARKER PLACEMENT  01/15/2021   Procedure: FIDUCIAL MARKER PLACEMENT;  Surgeon: Shelah Lamar RAMAN, MD;  Location: [facility] ENDOSCOPY;  Service: Pulmonary;;   ovaries removed  1986   surgery to remove scar tissue     from colon/bladder in patient's late 30's   TUBAL LIGATION  1986   UPPER GI ENDOSCOPY     years ago in patient's late 88s   VIDEO BRONCHOSCOPY WITH RADIAL ENDOBRONCHIAL ULTRASOUND  01/15/2021   Procedure: VIDEO BRONCHOSCOPY WITH RADIAL ENDOBRONCHIAL ULTRASOUND;  Surgeon: Shelah Lamar RAMAN, MD;  Location: [facility] ENDOSCOPY;  Service: Pulmonary;;    Family History  Problem Relation Age of Onset   Pancreatic cancer Maternal Aunt 50   Brain cancer Maternal Uncle    Stomach cancer Maternal Uncle        dx. >50   Prostate cancer Half-Brother        passed away at 38   Prostate cancer Half-Brother     Social History:  reports that she quit smoking about 2 years ago. Her smoking use included cigarettes. She started smoking about 62 years ago. She has a 30.3 pack-year smoking history. She has never used smokeless tobacco. She reports current alcohol use of about 4.0 standard drinks of alcohol per week. She reports current drug use. Frequency: 3.00 times per week. Drug: Marijuana.The patient is alone today.  Allergies:  Allergies  Allergen Reactions   Codeine Rash   Demerol [Meperidine Hcl] Other (See Comments)    coma   Meperidine Other (See Comments)    Several days will pass without patient being aware  Several days will pass without patient being aware    coma   Other Dermatitis    Stainless steel / skin weeps   Silver Dermatitis    Sterling silver skin weeps   Gold Bond [Menthol-Zinc Oxide] Dermatitis    Skin weeps   Neosporin [Bacitracin-Polymyxin B] Dermatitis    Skin weeps   Wound Dressing Adhesive Other (See Comments)     CAUSES SKIN TEARS PER PATIENT. INSTEAD USE THE TEGADERM 37M WITH ADHESIVE FREE WINDOW OVER PORT.   Wound Dressings Other (See Comments)    CAUSES SKIN TEARS PER PATIENT. INSTEAD USE THE TEGADERM 37M WITH ADHESIVE FREE WINDOW OVER PORT.    Current Medications: Current Outpatient Medications  Medication Sig Dispense Refill   aspirin 81 MG EC tablet Take 81 mg by mouth daily.     atorvastatin (LIPITOR) 40 MG tablet Take 40 mg by mouth daily.     celecoxib (CELEBREX) 200 MG capsule Take 200 mg by mouth daily.     HYDROcodone -acetaminophen  (NORCO/VICODIN) 5-325 MG tablet Take 1 tablet by mouth every 6 (six) hours as needed for moderate pain (pain score 4-6) or severe pain (pain score 7-10). 30 tablet  0   levothyroxine  (SYNTHROID ) 75 MCG tablet TAKE 1 TABLET(75 MCG) BY MOUTH DAILY 30 tablet 5   lidocaine -prilocaine  (EMLA ) cream Apply 1 Application topically as needed. 30 g 0   LORazepam  (ATIVAN ) 1 MG tablet Take 1 tablet (1 mg total) by mouth every 8 (eight) hours. Use 1-2 tablets by mouth prior to MRI scan 30 tablet 0   ondansetron  (ZOFRAN ) 4 MG tablet Take 1 tablet (4 mg total) by mouth every 4 (four) hours as needed for nausea. 90 tablet 3   pantoprazole  (PROTONIX ) 40 MG tablet TAKE 1 TABLET(40 MG) BY MOUTH DAILY 60 tablet 1   Prenatal Vit-Fe Fumarate-FA (PRENATAL VITAMIN) 27-0.8 MG TABS Take 1 tablet by mouth daily. 30 tablet 5   prochlorperazine  (COMPAZINE ) 10 MG tablet TAKE 1 TABLET(10 MG) BY MOUTH EVERY 6 HOURS AS NEEDED FOR NAUSEA OR VOMITING 360 tablet 0   VENTOLIN  HFA 108 (90 Base) MCG/ACT inhaler Inhale 1-2 puffs into the lungs as needed for wheezing or shortness of breath.     No current facility-administered medications for this visit.   Facility-Administered Medications Ordered in Other Visits  Medication Dose Route Frequency Provider Last Rate Last Admin   sodium chloride  flush (NS) 0.9 % injection 10 mL  10 mL Intracatheter PRN Cornelius Wanda DEL, MD   10 mL at 08/21/23 1042

## 2023-09-25 NOTE — Telephone Encounter (Signed)
 Patient has been scheduled for follow-up visit per 09/25/23 LOS.  Pt given an appt calendar with date and time.

## 2023-09-26 ENCOUNTER — Inpatient Hospital Stay

## 2023-09-26 VITALS — BP 161/74 | HR 70 | Temp 98.0°F | Resp 18

## 2023-09-26 DIAGNOSIS — C3492 Malignant neoplasm of unspecified part of left bronchus or lung: Secondary | ICD-10-CM

## 2023-09-26 DIAGNOSIS — Z5112 Encounter for antineoplastic immunotherapy: Secondary | ICD-10-CM | POA: Diagnosis not present

## 2023-09-26 LAB — T4: T4, Total: 9.4 ug/dL (ref 4.5–12.0)

## 2023-09-26 MED ORDER — SODIUM CHLORIDE 0.9 % IV SOLN
200.0000 mg | Freq: Once | INTRAVENOUS | Status: AC
  Administered 2023-09-26: 200 mg via INTRAVENOUS
  Filled 2023-09-26: qty 8

## 2023-09-26 MED ORDER — SODIUM CHLORIDE 0.9 % IV SOLN: INTRAVENOUS | Status: DC

## 2023-09-26 NOTE — Patient Instructions (Signed)

## 2023-10-03 ENCOUNTER — Other Ambulatory Visit: Payer: Self-pay

## 2023-10-07 ENCOUNTER — Other Ambulatory Visit: Payer: Self-pay | Admitting: Hematology and Oncology

## 2023-10-07 DIAGNOSIS — K219 Gastro-esophageal reflux disease without esophagitis: Secondary | ICD-10-CM

## 2023-10-15 ENCOUNTER — Other Ambulatory Visit

## 2023-10-16 ENCOUNTER — Ambulatory Visit (INDEPENDENT_AMBULATORY_CARE_PROVIDER_SITE_OTHER)
Admission: RE | Admit: 2023-10-16 | Discharge: 2023-10-16 | Disposition: A | Discharge status: Home / Self Care | Source: Ambulatory Visit | Attending: Hematology and Oncology | Admitting: Hematology and Oncology

## 2023-10-16 ENCOUNTER — Inpatient Hospital Stay: Attending: Hematology and Oncology

## 2023-10-16 DIAGNOSIS — D721 Eosinophilia, unspecified: Secondary | ICD-10-CM | POA: Insufficient documentation

## 2023-10-16 DIAGNOSIS — Z8042 Family history of malignant neoplasm of prostate: Secondary | ICD-10-CM | POA: Diagnosis not present

## 2023-10-16 DIAGNOSIS — C3432 Malignant neoplasm of lower lobe, left bronchus or lung: Secondary | ICD-10-CM | POA: Diagnosis present

## 2023-10-16 DIAGNOSIS — J90 Pleural effusion, not elsewhere classified: Secondary | ICD-10-CM | POA: Diagnosis not present

## 2023-10-16 DIAGNOSIS — Z885 Allergy status to narcotic agent status: Secondary | ICD-10-CM | POA: Insufficient documentation

## 2023-10-16 DIAGNOSIS — Z7982 Long term (current) use of aspirin: Secondary | ICD-10-CM | POA: Insufficient documentation

## 2023-10-16 DIAGNOSIS — Z881 Allergy status to other antibiotic agents status: Secondary | ICD-10-CM | POA: Diagnosis not present

## 2023-10-16 DIAGNOSIS — Z800 Family history of malignant neoplasm of digestive organs: Secondary | ICD-10-CM | POA: Insufficient documentation

## 2023-10-16 DIAGNOSIS — Z90710 Acquired absence of both cervix and uterus: Secondary | ICD-10-CM | POA: Insufficient documentation

## 2023-10-16 DIAGNOSIS — E032 Hypothyroidism due to medicaments and other exogenous substances: Secondary | ICD-10-CM | POA: Insufficient documentation

## 2023-10-16 DIAGNOSIS — J439 Emphysema, unspecified: Secondary | ICD-10-CM | POA: Insufficient documentation

## 2023-10-16 DIAGNOSIS — C3492 Malignant neoplasm of unspecified part of left bronchus or lung: Secondary | ICD-10-CM

## 2023-10-16 DIAGNOSIS — Z79890 Hormone replacement therapy (postmenopausal): Secondary | ICD-10-CM | POA: Insufficient documentation

## 2023-10-16 DIAGNOSIS — R590 Localized enlarged lymph nodes: Secondary | ICD-10-CM | POA: Insufficient documentation

## 2023-10-16 DIAGNOSIS — I700 Atherosclerosis of aorta: Secondary | ICD-10-CM | POA: Insufficient documentation

## 2023-10-16 DIAGNOSIS — Z90722 Acquired absence of ovaries, bilateral: Secondary | ICD-10-CM | POA: Insufficient documentation

## 2023-10-16 DIAGNOSIS — Z79899 Other long term (current) drug therapy: Secondary | ICD-10-CM | POA: Insufficient documentation

## 2023-10-16 DIAGNOSIS — I2510 Atherosclerotic heart disease of native coronary artery without angina pectoris: Secondary | ICD-10-CM | POA: Insufficient documentation

## 2023-10-16 DIAGNOSIS — Z902 Acquired absence of lung [part of]: Secondary | ICD-10-CM | POA: Insufficient documentation

## 2023-10-16 DIAGNOSIS — Z87891 Personal history of nicotine dependence: Secondary | ICD-10-CM | POA: Diagnosis not present

## 2023-10-16 DIAGNOSIS — F1290 Cannabis use, unspecified, uncomplicated: Secondary | ICD-10-CM | POA: Insufficient documentation

## 2023-10-16 DIAGNOSIS — K769 Liver disease, unspecified: Secondary | ICD-10-CM | POA: Diagnosis not present

## 2023-10-16 DIAGNOSIS — M1990 Unspecified osteoarthritis, unspecified site: Secondary | ICD-10-CM | POA: Insufficient documentation

## 2023-10-16 DIAGNOSIS — Z808 Family history of malignant neoplasm of other organs or systems: Secondary | ICD-10-CM | POA: Insufficient documentation

## 2023-10-16 DIAGNOSIS — Z5112 Encounter for antineoplastic immunotherapy: Secondary | ICD-10-CM | POA: Diagnosis present

## 2023-10-16 LAB — CBC WITH DIFFERENTIAL (CANCER CENTER ONLY)
Abs Immature Granulocytes: 0.02 K/uL (ref 0.00–0.07)
Basophils Absolute: 0.2 K/uL — ABNORMAL HIGH (ref 0.0–0.1)
Basophils Relative: 2 %
Eosinophils Absolute: 2.6 K/uL — ABNORMAL HIGH (ref 0.0–0.5)
Eosinophils Relative: 26 %
HCT: 40.9 % (ref 36.0–46.0)
Hemoglobin: 13.9 g/dL (ref 12.0–15.0)
Immature Granulocytes: 0 %
Lymphocytes Relative: 24 %
Lymphs Abs: 2.5 K/uL (ref 0.7–4.0)
MCH: 32 pg (ref 26.0–34.0)
MCHC: 34 g/dL (ref 30.0–36.0)
MCV: 94.2 fL (ref 80.0–100.0)
Monocytes Absolute: 0.9 K/uL (ref 0.1–1.0)
Monocytes Relative: 9 %
Neutro Abs: 3.9 K/uL (ref 1.7–7.7)
Neutrophils Relative %: 39 %
Platelet Count: 278 K/uL (ref 150–400)
RBC: 4.34 MIL/uL (ref 3.87–5.11)
RDW: 13 % (ref 11.5–15.5)
WBC Count: 10.2 K/uL (ref 4.0–10.5)
nRBC: 0 % (ref 0.0–0.2)

## 2023-10-16 LAB — CMP (CANCER CENTER ONLY)
ALT: 8 U/L (ref 0–44)
AST: 16 U/L (ref 15–41)
Albumin: 3.5 g/dL (ref 3.5–5.0)
Alkaline Phosphatase: 80 U/L (ref 38–126)
Anion gap: 10 (ref 5–15)
BUN: 14 mg/dL (ref 8–23)
CO2: 27 mmol/L (ref 22–32)
Calcium: 9.2 mg/dL (ref 8.9–10.3)
Chloride: 101 mmol/L (ref 98–111)
Creatinine: 0.82 mg/dL (ref 0.44–1.00)
GFR, Estimated: >60 mL/min (ref >60)
Glucose, Bld: 153 mg/dL — ABNORMAL HIGH (ref 70–99)
Potassium: 3.5 mmol/L (ref 3.5–5.1)
Sodium: 138 mmol/L (ref 135–145)
Total Bilirubin: 1.2 mg/dL (ref 0.0–1.2)
Total Protein: 7.2 g/dL (ref 6.5–8.1)

## 2023-10-16 MED ORDER — HEPARIN SOD (PORK) LOCK FLUSH 100 UNIT/ML IV SOLN
500.0000 [IU] | Freq: Once | INTRAVENOUS | Status: AC
  Administered 2023-10-16: 500 [IU] via INTRAVENOUS

## 2023-10-16 MED ORDER — IOHEXOL 300 MG/ML  SOLN
100.0000 mL | Freq: Once | INTRAMUSCULAR | Status: AC | PRN
  Administered 2023-10-16: 75 mL via INTRAVENOUS

## 2023-10-17 ENCOUNTER — Other Ambulatory Visit: Payer: Self-pay | Admitting: Oncology

## 2023-10-17 ENCOUNTER — Inpatient Hospital Stay (HOSPITAL_BASED_OUTPATIENT_CLINIC_OR_DEPARTMENT_OTHER): Admitting: Oncology

## 2023-10-17 ENCOUNTER — Encounter: Payer: Self-pay | Admitting: Oncology

## 2023-10-17 VITALS — BP 127/87 | HR 76 | Temp 98.0°F | Resp 16 | Ht 59.0 in | Wt 94.5 lb

## 2023-10-17 DIAGNOSIS — C7802 Secondary malignant neoplasm of left lung: Secondary | ICD-10-CM

## 2023-10-17 DIAGNOSIS — C3492 Malignant neoplasm of unspecified part of left bronchus or lung: Secondary | ICD-10-CM

## 2023-10-17 DIAGNOSIS — C7801 Secondary malignant neoplasm of right lung: Secondary | ICD-10-CM

## 2023-10-17 DIAGNOSIS — C3432 Malignant neoplasm of lower lobe, left bronchus or lung: Secondary | ICD-10-CM | POA: Diagnosis not present

## 2023-10-17 NOTE — Progress Notes (Signed)
 [facility]  [address] [place],  [place]  [address] [phone]  Clinic Day:  10/17/23  Referring physician: Jama Chow, MD  ASSESSMENT & PLAN:  Assessment: Non-small cell lung cancer, left (HCC) Stage IIB adenocarcinoma of the left lung diagnosed in December 2022.  She was treated with left lower lobectomy in [place] .  Her doctor there recommended chemotherapy with immunotherapy, but she refused chemotherapy.  PD-L1 was positive.  She was only willing to take the immunotherapy for 1 year. She started pembrolizumab  in August 2023.  We saw her in October 2023, when she returned to [place]  and continued pembrolizumab  every 21 days. Routine CT imaging had not shown any evidence of recurrence.  She completed 17 cycles of adjuvant pembrolizumab  in August and CT chest following that was negative for recurrence. There were stable scattered small bilateral pulmonary nodules, felt to be from benign emphysema.  Surgical changes were seen in the left lower lobe.    Unfortunately, she had evidence of recurrence on CT chest in December of 2024, which revealed interval progression in the size and number of multiple bilateral small pulmonary nodules most consistent with progression of metastatic disease. We decided not to pursue a biopsy in view of the difficulty involved. Treatment options were discussed and she wished not to pursue chemotherapy unless absolutely necessary.  She was placed back on pembrolizumab  and had a repeat CT on 04/24/2023. CT chest describes progression of disease as the mediastinal and hilar nodes are mildly larger, the pulmonary nodules are minimally larger, and there is still a subpleural opacity which may be atelectasis or evolving scar tissue.  She also had a 3 mm tiny hypervascular focus in the anterior hepatic dome that was previously present and an 8 mm subcapsular low-density lesion medial right liver that is stable, but both are too small to characterize.  However, when I reviewed the images and measurements, the differences were minimal, so we continued the immunotherapy.  Repeat CT scan from 07/10/2023 again shows very mild progression with some increased mediastinal and bilateral hilar lymphadenopathy but minimal increases.  She has multiple bilateral pulmonary nodules but only up to 8 mm in size and so just mildly progressive.  She has a small left pleural effusion with mild pleural thickening.  I reviewed this information with her and once again we have opted to continue immunotherapy. CT chest done on 10/16/2023 revealed redemonstration of multiple bilateral lung nodules, no new lung mass or consolidation, redemonstration of multiple stable heterogeneous mediastinal, bilateral hilar, and pericardial lymph nodes. This was similar to her prior scan. There is smooth interlobular and intralobular septal thickening throughout bilateral lungs.. So we will continue the pembrolizumab  every 3 weeks.   Malignant neoplasm metastatic to lung [facility]) CT chest in December 2024 revealed multiple bilateral pulmonary nodules which increased in size and number since imaging in August 2024.  These measure up to 7 mm in diameter.  There was an area of pleural plaque in the posteriomedial left upper lobe, measuring 2.9 cm.  Prior PD-L1 was positive at 90%, so she resumed pembrolizumab  in January.  We don't have the radiologist's report on her current CT but I reviewed the images with her and her son, and the change in disease seems minimal.  Now she has a repeat CT scan from 07/10/2023 which again shows very mild progression with some increased mediastinal and bilateral hilar lymphadenopathy but minimal increases.  She still has multiple bilateral pulmonary nodules but only up to 8  mm in size and so just mildly progressive.  She has a small left pleural effusion with mild pleural thickening.  I reviewed this information with her and once again we have opted to continue immunotherapy.    Eosinophilia She has had elevated eosinophil counts all along which I feel are related to her allergies but the eosinophil count was higher than usual at 26% with an absolute eosinophil count of 2.6. we will just continue to monitor this.    Hypothyroidism due to medication The TSH has fluctuated up and down.  She is currently on levothyroxine  75 mcg daily.  Her last TSH was slightly elevated at 5.322.  TSH is pending from today.  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  Her bilirubin has fluctuated up and down due to Gilbert's syndrome and isnormal today.  We will proceed with pembrolizumab  and continue to monitor this.    Plan: She had a CT chest done on 10/16/2023 that revealed redemonstration of multiple bilateral lung nodules, no new lung mass or consolidation, redemonstration of multiple stable heterogeneous mediastinal, bilateral hilar, and pericardial lymph nodes. This was similar to her prior scan. There is smooth interlobular and intralobular septal thickening throughout bilateral lungs and multiple other nonacute observations were noted. Her day 1 cycle 13 of Keytruda  is scheduled on 10/20/2023. She has a WBC of 10.2 with 26% eosinophils, hemoglobin of 13.9, and platelet count of 278,000. Her CMP was normal. She continues Synthroid  75 mcg without difficulty. She will continue treatment every 3 weeks and I will see her back in 3 weeks with CBC and CMP. In 6 weeks she will be due for CBC, CMP, TSH, and T4. The patient understands the plans discussed today and is in agreement with them.  She knows to contact our office if she develops concerns prior to her next appointment.  I provided 16 minutes of face-to-face time during this encounter and > 50% was spent counseling as documented under my assessment and plan.   Sydney VEAR Cornish, MD  [facility] A DEPT OF [facility] [address] [place] [address] Dept: [phone] Dept Fax: [phone]   No orders of the defined types were placed in this encounter.   CHIEF COMPLAINT:  CC: Metastatic non-small cell lung cancer  Current Treatment: Pembrolizumab  every 3 weeks  HISTORY OF PRESENT ILLNESS:   Oncology History  Non-small cell lung cancer, left (HCC)  01/15/2021 Initial Diagnosis   Non-small cell lung cancer, left (HCC)   02/12/2021 Cancer Staging   Staging form: Lung, AJCC 8th Edition - Clinical stage from 02/12/2021: Stage IA2 (cT1b, cN0, cM0) - Signed by Hunt Sydney VEAR, MD on 02/14/2021 Histopathologic type: Adenocarcinoma, NOS Stage prefix: Initial diagnosis Histologic grade (G): GX Histologic grading system: 4 grade system Laterality: Left Tumor size (mm): 19 Lymph-vascular invasion (LVI): LVI not present (absent)/not identified Diagnostic confirmation: Positive histology Specimen type: Bronchial Biopsy Staged by: Managing physician Stage used in treatment planning: Yes National guidelines used in treatment planning: Yes Type of national guideline used in treatment planning: NCCN Staging comments: Rec surgical resection   05/11/2021 Cancer Staging   Staging form: Lung, AJCC 8th Edition - Pathologic stage from 05/11/2021: Stage IIB (ypT1c, pN1, cM0) - Signed by Hunt Sydney VEAR, MD on 11/12/2021 Histopathologic type: Adenocarcinoma, NOS Stage prefix: Post-therapy Histologic grade (G): G2 Histologic grading system: 4 grade system Residual tumor (R): R0 - None Laterality:  Left Tumor size (mm): 25 Lymph-vascular invasion (LVI): LVI not present (absent)/not identified Diagnostic confirmation: Positive histology PLUS positive immunophenotyping and/or positive genetic studies Specimen type: Excision Staged by: Managing physician Type of lung cancer: Surgically resected non-small cell lung cancer ECOG performance status: Grade 1 Perineural invasion (PNI): Absent Pleural/elastic layer  invasion: PL0 Pleural lavage cytology: Unknown Weight loss: Absent Adequacy of mediastinal dissection: Adequate Radiotherapy dose: No Adjuvant radiation: No Adjuvant chemotherapy: Yes Stage used in treatment planning: Yes National guidelines used in treatment planning: Yes Type of national guideline used in treatment planning: NCCN Staging comments: Adjuvant pembrolizumab , PDL 1 90%   10/04/2021 - 10/01/2022 Chemotherapy   Patient is on Treatment Plan : LUNG NSCLC Pembrolizumab  Adj q21d x 18 cycles     02/04/2023 -  Chemotherapy   Patient is on Treatment Plan : LUNG NSCLC Pembrolizumab  (200) q21d      INTERVAL HISTORY:  Sydney Hunt is here today for repeat clinical assessment for her metastatic non-small cell lung cancer. Patient states that she feels well but complains of back pain rating a 3/10.  She had a CT chest done on 10/16/2023 that revealed redemonstration of multiple bilateral lung nodules, no new lung mass or consolidation, redemonstration of multiple stable heterogeneous mediastinal, bilateral hilar, and pericardial lymph nodes. This was similar to her prior scan. There is smooth interlobular and intralobular septal thickening throughout bilateral lungs and multiple other nonacute observations were noted. Her day 1 cycle 13 of Keytruda  is scheduled on 10/20/2023. She has a WBC of 10.2 with 26% eosinophils, hemoglobin of 13.9, and platelet count of 278,000. Her CMP was normal. She continues Synthroid  75 mcg without difficulty. She will continue treatment every 3 weeks and I will see her back in 3 weeks with CBC and CMP. In 6 weeks she will be due for CBC, CMP, TSH, and T4. She denies fever, chills, night sweats, or other signs of infection. She denies cardiorespiratory and gastrointestinal issues. Her appetite is ok and Her weight has increased 1 pounds over last 3 weeks.    REVIEW OF SYSTEMS:  Review of Systems  Constitutional:  Negative for appetite change (ok), chills, diaphoresis, fatigue,  fever and unexpected weight change.  HENT:  Negative.  Negative for hearing loss, lump/mass, mouth sores, nosebleeds, sore throat, tinnitus, trouble swallowing and voice change.   Eyes: Negative.   Respiratory:  Positive for cough and shortness of breath (with exertion). Negative for chest tightness, hemoptysis and wheezing.   Cardiovascular: Negative.  Negative for chest pain, leg swelling and palpitations.  Gastrointestinal: Negative.  Negative for abdominal distention, abdominal pain, blood in stool, constipation, diarrhea, nausea, rectal pain and vomiting.  Endocrine: Negative.  Negative for hot flashes.  Genitourinary:  Negative for bladder incontinence, difficulty urinating, dyspareunia, dysuria, frequency, hematuria, menstrual problem, nocturia, pelvic pain, vaginal bleeding and vaginal discharge.   Musculoskeletal:  Positive for back pain (3/10). Negative for arthralgias, flank pain, gait problem and myalgias.  Skin: Negative.  Negative for itching, rash and wound.  Neurological:  Negative for dizziness, extremity weakness, gait problem, headaches, light-headedness, numbness, seizures and speech difficulty.  Hematological: Negative.  Negative for adenopathy. Does not bruise/bleed easily.  Psychiatric/Behavioral: Negative.  Negative for confusion, decreased concentration, depression, sleep disturbance and suicidal ideas. The patient is not nervous/anxious.    VITALS:  Blood pressure 127/87, pulse 76, temperature 98 F (36.7 C), temperature source Oral, resp. rate 16, height 4' 11 (1.499 m), weight 94 lb 8 oz (42.9 kg), SpO2 95%.  Wt Readings from  Last 3 Encounters:  10/17/23 94 lb 8 oz (42.9 kg)  09/25/23 93 lb 3.2 oz (42.3 kg)  09/05/23 94 lb 6.4 oz (42.8 kg)    Body mass index is 19.09 kg/m.  Performance status (ECOG): 1 - Symptomatic but completely ambulatory  PHYSICAL EXAM:  Physical Exam Vitals and nursing note reviewed.  Constitutional:      General: She is not in acute  distress.    Appearance: Normal appearance.  HENT:     Head: Normocephalic and atraumatic.     Right Ear: Tympanic membrane, ear canal and external ear normal. There is no impacted cerumen.     Left Ear: Tympanic membrane, ear canal and external ear normal. There is no impacted cerumen.     Nose: Nose normal. No congestion or rhinorrhea.     Mouth/Throat:     Mouth: Mucous membranes are moist.     Pharynx: Oropharynx is clear. No oropharyngeal exudate or posterior oropharyngeal erythema.  Eyes:     General: No scleral icterus.       Right eye: No discharge.        Left eye: No discharge.     Extraocular Movements: Extraocular movements intact.     Conjunctiva/sclera: Conjunctivae normal.     Pupils: Pupils are equal, round, and reactive to light.  Cardiovascular:     Rate and Rhythm: Normal rate and regular rhythm.     Pulses: Normal pulses.     Heart sounds: Normal heart sounds. No murmur heard.    No friction rub. No gallop.  Pulmonary:     Effort: Pulmonary effort is normal.     Breath sounds: No transmitted upper airway sounds. Rales present. No wheezing or rhonchi.     Comments: Bibasilar rales Abdominal:     General: Bowel sounds are normal. There is no distension.     Palpations: Abdomen is soft. There is no mass.     Tenderness: There is no abdominal tenderness. There is no right CVA tenderness, left CVA tenderness, guarding or rebound.     Hernia: No hernia is present.  Musculoskeletal:        General: Normal range of motion.     Cervical back: Normal range of motion and neck supple. No tenderness.     Right lower leg: No edema.     Left lower leg: No edema.  Lymphadenopathy:     Cervical: No cervical adenopathy.  Skin:    General: Skin is warm and dry.     Coloration: Skin is not jaundiced.     Findings: No rash.  Neurological:     General: No focal deficit present.     Mental Status: She is alert and oriented to person, place, and time. Mental status is at  baseline.     Cranial Nerves: No cranial nerve deficit.     Sensory: No sensory deficit.     Motor: No weakness.     Coordination: Coordination normal.     Gait: Gait normal.     Deep Tendon Reflexes: Reflexes normal.  Psychiatric:        Mood and Affect: Mood normal.        Behavior: Behavior normal.        Thought Content: Thought content normal.        Judgment: Judgment normal.    LABS:      Latest Ref Rng & Units 10/16/2023    1:40 PM 09/25/2023    9:29 AM 09/04/2023  9:39 AM  CBC  WBC 4.0 - 10.5 K/uL 10.2  8.6  8.8   Hemoglobin 12.0 - 15.0 g/dL 86.0  85.8  84.9   Hematocrit 36.0 - 46.0 % 40.9  42.6  43.0   Platelets 150 - 400 K/uL 278  232  275       Latest Ref Rng & Units 10/16/2023    1:40 PM 09/25/2023    9:29 AM 09/04/2023    9:39 AM  CMP  Glucose 70 - 99 mg/dL 846  89  95   BUN 8 - 23 mg/dL 14  14  17    Creatinine 0.44 - 1.00 mg/dL 9.17  9.26  9.15   Sodium 135 - 145 mmol/L 138  138  138   Potassium 3.5 - 5.1 mmol/L 3.5  4.1  3.9   Chloride 98 - 111 mmol/L 101  101  103   CO2 22 - 32 mmol/L 27  26  25    Calcium 8.9 - 10.3 mg/dL 9.2  9.7  9.7   Total Protein 6.5 - 8.1 g/dL 7.2  7.5  7.9   Total Bilirubin 0.0 - 1.2 mg/dL 1.2  1.7  2.1   Alkaline Phos 38 - 126 U/L 80  68  90   AST 15 - 41 U/L 16  14  15    ALT 0 - 44 U/L 8  7  8       Lab Results  Component Value Date   CEA1 5.3 (H) 10/17/2022   CEA 4.2 01/13/2023   /  CEA  Date Value Ref Range Status  01/13/2023 4.2  Final  10/17/2022 5.3 (H) 0.0 - 4.7 ng/mL Final    Comment:    (NOTE)                             Nonsmokers          <3.9                             Smokers             <5.6 [employer] Electrochemiluminescence Immunoassay (ECLIA) Values obtained with different assay methods or kits cannot be used interchangeably.  Results cannot be interpreted as absolute evidence of the presence or absence of malignant disease. Performed At: [facility] [address] [place],  [place] [id number] Jennette Shorter MD Ey:[phone]     Lab Results  Component Value Date   LDH 130 11/28/2022    STUDIES:   CT Chest W Contrast Result Date: 10/16/2023 CLINICAL DATA:  Lung cancer. Non-small cell lung cancer. Ongoing treatment. * Tracking Code: BO * EXAM: CT CHEST WITH CONTRAST TECHNIQUE: Multidetector CT imaging of the chest was performed during intravenous contrast administration. RADIATION DOSE REDUCTION: This exam was performed according to the departmental dose-optimization program which includes automated exposure control, adjustment of the mA and/or kV according to patient size and/or use of iterative reconstruction technique. CONTRAST:  75mL OMNIPAQUE  IOHEXOL  300 MG/ML  SOLN COMPARISON:  CT scan chest from 07/10/2023. FINDINGS: Cardiovascular: Normal cardiac size. No pericardial effusion. No aortic aneurysm. There are coronary artery calcifications, in keeping with coronary artery disease. There are also mild peripheral atherosclerotic vascular calcifications of thoracic aorta and its major branches. Mediastinum/Nodes: Visualized thyroid  gland appears grossly unremarkable. No solid / cystic mediastinal masses. The esophagus is nondistended precluding optimal assessment. There are multiple enlarged, heterogeneous mediastinal  and bilateral hilar lymph nodes with largest in the subcarinal region measuring up to 17 x 28 mm. These are essentially similar to prior study and favored metastatic in etiology. There are few pericardial lymph nodes, which are stable. There are few bilateral axillary lymph nodes with short axis less than 5 mm, which are indeterminate. Lungs/Pleura: The central tracheo-bronchial tree is patent. Postsurgical changes from prior left lower lobectomy noted. Redemonstration of multiple (more than 10), nodules throughout bilateral lungs (marked with electronic arrow sign on series 302) which appear essentially similar to the prior study. Several of these nodules are  along the bronchovascular bundles and favored to represent intrapulmonary lymph nodes. There is stable focal irregular pleural thickening along the left lower posterior hemithorax. No new mass or consolidation. No pleural effusion or pneumothorax. Moderate upper lobe predominant emphysematous changes noted. There is smooth interlobular and intralobular septal thickening throughout bilateral lungs, suggesting congestive heart failure/pulmonary edema. Upper Abdomen: There is a stable subcentimeter subcapsular hypoattenuating focus in the right hepatic lobe, segment 7, which is too small to adequately characterize. Remaining visualized upper abdominal viscera within normal limits. Musculoskeletal: The visualized soft tissues of the chest wall are grossly unremarkable. No suspicious osseous lesions. Schmorl's node noted along the inferior endplate of L1 vertebrae. There are mild multilevel degenerative changes in the visualized spine. IMPRESSION: 1. Redemonstration of multiple bilateral lung nodules, essentially similar to the prior study. No new lung mass or consolidation. 2. Redemonstration of multiple stable, heterogeneous mediastinal, bilateral hilar and pericardial lymph nodes, favored metastatic. 3. There is smooth interlobular and intralobular septal thickening throughout bilateral lungs, suggesting congestive heart failure/pulmonary edema. 4. Multiple other nonacute observations, as described above. Aortic Atherosclerosis (ICD10-I70.0) and Emphysema (ICD10-J43.9). Electronically Signed   By: Ree Molt M.D.   On: 10/16/2023 15:23    EXAM: 08/21/2023 MRI HEAD WITHOUT AND WITH CONTRAST IMPRESSION: 1. No evidence of intracranial metastatic disease. 2. Unremarkable MRI appearance of the brain for age. 3. Mild paranasal sinus disease as described.  HISTORY:   Past Medical History:  Diagnosis Date   Allergy    Arthritis    Atherosclerotic vascular disease 01/16/2022   Cardiac murmur 01/16/2022    Complication of anesthesia    slow to wake up   COPD (chronic obstructive pulmonary disease) (HCC)    COPD, mild (HCC) 12/15/2020   DOE (dyspnea on exertion) 01/16/2022   Family history of pancreatic cancer 11/28/2020   Family history of prostate cancer 11/28/2020   Family history of stomach cancer 11/28/2020   Genetic testing 12/18/2020   [facility] Multi-Cancer Panel was Negative. Report date is 12/12/2020.     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   Leukocytosis 01/17/2022   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) 02/11/2023   Multiple bilateral pulmonary nodules up to 7 mm in diameter, increased in size and number from prior study of August 2024     Migraines    Nodule of left lung 11/21/2020   Non-small cell lung cancer,  left (HCC) 01/15/2021   Port-A-Cath in place 09/05/2022   Tobacco use 12/07/2020    Past Surgical History:  Procedure Laterality Date   ABDOMINAL HYSTERECTOMY  1989   unsure of vaginal or abdominal   BRONCHIAL BIOPSY  01/15/2021   Procedure: BRONCHIAL BIOPSIES;  Surgeon: Shelah Lamar RAMAN, MD;  Location: [facility] ENDOSCOPY;  Service: Pulmonary;;   BRONCHIAL BRUSHINGS  01/15/2021   Procedure: BRONCHIAL BRUSHINGS;  Surgeon: Shelah Lamar RAMAN, MD;  Location: [facility] ENDOSCOPY;  Service: Pulmonary;;   BRONCHIAL NEEDLE ASPIRATION BIOPSY  01/15/2021   Procedure: BRONCHIAL NEEDLE ASPIRATION BIOPSIES;  Surgeon: Shelah Lamar RAMAN,  MD;  Location: [facility] ENDOSCOPY;  Service: Pulmonary;;   COLONOSCOPY  2022   FIDUCIAL MARKER PLACEMENT  01/15/2021   Procedure: FIDUCIAL MARKER PLACEMENT;  Surgeon: Shelah Lamar RAMAN, MD;  Location: [facility] ENDOSCOPY;  Service: Pulmonary;;   ovaries removed  1986   surgery to remove scar tissue     from colon/bladder in patient's late 30's   TUBAL LIGATION  1986   UPPER GI ENDOSCOPY     years ago in patient's late 24s   VIDEO BRONCHOSCOPY WITH RADIAL ENDOBRONCHIAL ULTRASOUND  01/15/2021   Procedure: VIDEO BRONCHOSCOPY WITH RADIAL ENDOBRONCHIAL ULTRASOUND;  Surgeon: Shelah Lamar RAMAN, MD;  Location: [facility] ENDOSCOPY;  Service: Pulmonary;;    Family History  Problem Relation Age of Onset   Pancreatic cancer Maternal Aunt 50   Brain cancer Maternal Uncle    Stomach cancer Maternal Uncle        dx. >50   Prostate cancer Half-Brother        passed away at 41   Prostate cancer Half-Brother     Social History:  reports that she quit smoking about 2 years ago. Her smoking use included cigarettes. She started smoking about 62 years ago. She has a 30.3 pack-year smoking history. She has never used smokeless tobacco. She reports current alcohol use of about 4.0 standard drinks of alcohol per week. She reports current drug use. Frequency: 3.00 times per week. Drug: Marijuana.The patient is alone today.  Allergies:  Allergies  Allergen Reactions   Codeine Rash   Demerol [Meperidine Hcl] Other (See Comments)    coma   Meperidine Other (See Comments)    Several days will pass without patient being aware  Several days will pass without patient being aware    coma   Other Dermatitis    Stainless steel / skin weeps   Silver Dermatitis    Sterling silver skin weeps   Gold Bond [Menthol-Zinc Oxide] Dermatitis    Skin weeps   Neosporin [Bacitracin-Polymyxin B] Dermatitis    Skin weeps   Wound Dressing Adhesive Other (See Comments)    CAUSES SKIN TEARS PER PATIENT. INSTEAD USE THE TEGADERM 100M WITH ADHESIVE  FREE WINDOW OVER PORT.   Wound Dressings Other (See Comments)    CAUSES SKIN TEARS PER PATIENT. INSTEAD USE THE TEGADERM 100M WITH ADHESIVE FREE WINDOW OVER PORT.    Current Medications: Current Outpatient Medications  Medication Sig Dispense Refill   aspirin 81 MG EC tablet Take 81 mg by mouth daily.     atorvastatin (LIPITOR) 40 MG tablet Take 40 mg by mouth daily.     celecoxib (CELEBREX) 200 MG capsule Take 200 mg by mouth daily.     HYDROcodone -acetaminophen  (NORCO/VICODIN) 5-325 MG tablet Take 1 tablet by mouth every 6 (six) hours as needed for moderate pain (pain score 4-6) or severe pain (pain score 7-10). 30 tablet 0   levothyroxine  (  SYNTHROID ) 75 MCG tablet TAKE 1 TABLET(75 MCG) BY MOUTH DAILY 30 tablet 5   lidocaine -prilocaine  (EMLA ) cream Apply 1 Application topically as needed. 30 g 0   LORazepam  (ATIVAN ) 1 MG tablet Take 1 tablet (1 mg total) by mouth every 8 (eight) hours. Use 1-2 tablets by mouth prior to MRI scan 30 tablet 0   ondansetron  (ZOFRAN ) 4 MG tablet Take 1 tablet (4 mg total) by mouth every 4 (four) hours as needed for nausea. 90 tablet 3   pantoprazole  (PROTONIX ) 40 MG tablet TAKE 1 TABLET(40 MG) BY MOUTH DAILY 60 tablet 1   Prenatal Vit-Fe Fumarate-FA (PRENATAL VITAMIN) 27-0.8 MG TABS Take 1 tablet by mouth daily. 30 tablet 5   prochlorperazine  (COMPAZINE ) 10 MG tablet TAKE 1 TABLET(10 MG) BY MOUTH EVERY 6 HOURS AS NEEDED FOR NAUSEA OR VOMITING 360 tablet 0   VENTOLIN  HFA 108 (90 Base) MCG/ACT inhaler Inhale 1-2 puffs into the lungs as needed for wheezing or shortness of breath.     No current facility-administered medications for this visit.   Facility-Administered Medications Ordered in Other Visits  Medication Dose Route Frequency Provider Last Rate Last Admin   sodium chloride  flush (NS) 0.9 % injection 10 mL  10 mL Intracatheter PRN Cornelius Sydney DEL, MD   10 mL at 08/21/23 1042    I,Jasmine M Lassiter,acting as a Neurosurgeon for Sydney DEL Cornelius, MD.,have  documented all relevant documentation on the behalf of Sydney DEL Cornelius, MD,as directed by  Sydney DEL Cornelius, MD while in the presence of Sydney DEL Cornelius, MD.

## 2023-10-18 ENCOUNTER — Other Ambulatory Visit: Payer: Self-pay

## 2023-10-20 ENCOUNTER — Inpatient Hospital Stay

## 2023-10-20 VITALS — BP 161/86 | HR 70 | Temp 99.0°F | Resp 18

## 2023-10-20 DIAGNOSIS — C3432 Malignant neoplasm of lower lobe, left bronchus or lung: Secondary | ICD-10-CM | POA: Diagnosis not present

## 2023-10-20 DIAGNOSIS — C3492 Malignant neoplasm of unspecified part of left bronchus or lung: Secondary | ICD-10-CM

## 2023-10-20 MED ORDER — SODIUM CHLORIDE 0.9 % IV SOLN
200.0000 mg | Freq: Once | INTRAVENOUS | Status: AC
  Administered 2023-10-20: 200 mg via INTRAVENOUS
  Filled 2023-10-20: qty 200

## 2023-10-20 MED ORDER — SODIUM CHLORIDE 0.9 % IV SOLN: INTRAVENOUS | Status: DC

## 2023-10-20 NOTE — Patient Instructions (Signed)

## 2023-10-21 ENCOUNTER — Ambulatory Visit: Admitting: Diagnostic Neuroimaging

## 2023-10-28 ENCOUNTER — Encounter: Payer: Self-pay | Admitting: Oncology

## 2023-11-06 ENCOUNTER — Inpatient Hospital Stay

## 2023-11-06 ENCOUNTER — Encounter: Payer: Self-pay | Admitting: Oncology

## 2023-11-06 ENCOUNTER — Inpatient Hospital Stay: Attending: Hematology and Oncology | Admitting: Oncology

## 2023-11-06 VITALS — BP 159/106 | HR 72 | Temp 98.3°F | Resp 18 | Ht 59.0 in | Wt 92.8 lb

## 2023-11-06 DIAGNOSIS — Z79890 Hormone replacement therapy (postmenopausal): Secondary | ICD-10-CM | POA: Diagnosis not present

## 2023-11-06 DIAGNOSIS — D721 Eosinophilia, unspecified: Secondary | ICD-10-CM | POA: Insufficient documentation

## 2023-11-06 DIAGNOSIS — C3492 Malignant neoplasm of unspecified part of left bronchus or lung: Secondary | ICD-10-CM | POA: Diagnosis not present

## 2023-11-06 DIAGNOSIS — C7801 Secondary malignant neoplasm of right lung: Secondary | ICD-10-CM | POA: Diagnosis not present

## 2023-11-06 DIAGNOSIS — Z90710 Acquired absence of both cervix and uterus: Secondary | ICD-10-CM | POA: Insufficient documentation

## 2023-11-06 DIAGNOSIS — Z79899 Other long term (current) drug therapy: Secondary | ICD-10-CM | POA: Diagnosis not present

## 2023-11-06 DIAGNOSIS — Z90722 Acquired absence of ovaries, bilateral: Secondary | ICD-10-CM | POA: Insufficient documentation

## 2023-11-06 DIAGNOSIS — Z8042 Family history of malignant neoplasm of prostate: Secondary | ICD-10-CM | POA: Insufficient documentation

## 2023-11-06 DIAGNOSIS — Z7982 Long term (current) use of aspirin: Secondary | ICD-10-CM | POA: Diagnosis not present

## 2023-11-06 DIAGNOSIS — I10 Essential (primary) hypertension: Secondary | ICD-10-CM | POA: Insufficient documentation

## 2023-11-06 DIAGNOSIS — K769 Liver disease, unspecified: Secondary | ICD-10-CM | POA: Insufficient documentation

## 2023-11-06 DIAGNOSIS — Z800 Family history of malignant neoplasm of digestive organs: Secondary | ICD-10-CM | POA: Insufficient documentation

## 2023-11-06 DIAGNOSIS — Z5112 Encounter for antineoplastic immunotherapy: Secondary | ICD-10-CM | POA: Insufficient documentation

## 2023-11-06 DIAGNOSIS — Z885 Allergy status to narcotic agent status: Secondary | ICD-10-CM | POA: Insufficient documentation

## 2023-11-06 DIAGNOSIS — Z881 Allergy status to other antibiotic agents status: Secondary | ICD-10-CM | POA: Insufficient documentation

## 2023-11-06 DIAGNOSIS — J439 Emphysema, unspecified: Secondary | ICD-10-CM | POA: Insufficient documentation

## 2023-11-06 DIAGNOSIS — F1290 Cannabis use, unspecified, uncomplicated: Secondary | ICD-10-CM | POA: Diagnosis not present

## 2023-11-06 DIAGNOSIS — J90 Pleural effusion, not elsewhere classified: Secondary | ICD-10-CM | POA: Insufficient documentation

## 2023-11-06 DIAGNOSIS — C7802 Secondary malignant neoplasm of left lung: Secondary | ICD-10-CM | POA: Diagnosis not present

## 2023-11-06 DIAGNOSIS — Z87891 Personal history of nicotine dependence: Secondary | ICD-10-CM | POA: Diagnosis not present

## 2023-11-06 DIAGNOSIS — E032 Hypothyroidism due to medicaments and other exogenous substances: Secondary | ICD-10-CM | POA: Diagnosis not present

## 2023-11-06 DIAGNOSIS — R590 Localized enlarged lymph nodes: Secondary | ICD-10-CM | POA: Insufficient documentation

## 2023-11-06 DIAGNOSIS — Z808 Family history of malignant neoplasm of other organs or systems: Secondary | ICD-10-CM | POA: Insufficient documentation

## 2023-11-06 DIAGNOSIS — C3432 Malignant neoplasm of lower lobe, left bronchus or lung: Secondary | ICD-10-CM | POA: Insufficient documentation

## 2023-11-06 LAB — CBC WITH DIFFERENTIAL (CANCER CENTER ONLY)
Abs Immature Granulocytes: 0.02 K/uL (ref 0.00–0.07)
Basophils Absolute: 0.3 K/uL — ABNORMAL HIGH (ref 0.0–0.1)
Basophils Relative: 3 %
Eosinophils Absolute: 2.3 K/uL — ABNORMAL HIGH (ref 0.0–0.5)
Eosinophils Relative: 24 %
HCT: 44.7 % (ref 36.0–46.0)
Hemoglobin: 15.1 g/dL — ABNORMAL HIGH (ref 12.0–15.0)
Immature Granulocytes: 0 %
Lymphocytes Relative: 22 %
Lymphs Abs: 2.2 K/uL (ref 0.7–4.0)
MCH: 31.7 pg (ref 26.0–34.0)
MCHC: 33.8 g/dL (ref 30.0–36.0)
MCV: 93.7 fL (ref 80.0–100.0)
Monocytes Absolute: 1 K/uL (ref 0.1–1.0)
Monocytes Relative: 11 %
Neutro Abs: 3.9 K/uL (ref 1.7–7.7)
Neutrophils Relative %: 40 %
Platelet Count: 236 K/uL (ref 150–400)
RBC: 4.77 MIL/uL (ref 3.87–5.11)
RDW: 13.3 % (ref 11.5–15.5)
WBC Count: 9.7 K/uL (ref 4.0–10.5)
nRBC: 0 % (ref 0.0–0.2)

## 2023-11-06 LAB — CMP (CANCER CENTER ONLY)
ALT: 11 U/L (ref 0–44)
AST: 17 U/L (ref 15–41)
Albumin: 3.9 g/dL (ref 3.5–5.0)
Alkaline Phosphatase: 101 U/L (ref 38–126)
Anion gap: 9 (ref 5–15)
BUN: 13 mg/dL (ref 8–23)
CO2: 27 mmol/L (ref 22–32)
Calcium: 9.5 mg/dL (ref 8.9–10.3)
Chloride: 102 mmol/L (ref 98–111)
Creatinine: 0.8 mg/dL (ref 0.44–1.00)
GFR, Estimated: >60 mL/min (ref >60)
Glucose, Bld: 137 mg/dL — ABNORMAL HIGH (ref 70–99)
Potassium: 3.7 mmol/L (ref 3.5–5.1)
Sodium: 137 mmol/L (ref 135–145)
Total Bilirubin: 1.4 mg/dL — ABNORMAL HIGH (ref 0.0–1.2)
Total Protein: 8 g/dL (ref 6.5–8.1)

## 2023-11-06 NOTE — Progress Notes (Signed)
 [facility]  [address] [place],  [place]  [address] [phone]  Clinic Day: 11/06/23   Referring physician: Jama Chow, MD  ASSESSMENT & PLAN:  Assessment: Non-small cell lung cancer, left (HCC) Stage IIB adenocarcinoma of the left lung diagnosed in December 2022.  She was treated with left lower lobectomy in [place] .  Her doctor there recommended chemotherapy with immunotherapy, but she refused chemotherapy.  PD-L1 was positive.  She was only willing to take the immunotherapy for 1 year. She started pembrolizumab  in August 2023.  We saw her in October 2023, when she returned to [place]  and continued pembrolizumab  every 21 days. Routine CT imaging had not shown any evidence of recurrence.  She completed 17 cycles of adjuvant pembrolizumab  in August and CT chest following that was negative for recurrence. There were stable scattered small bilateral pulmonary nodules, felt to be from benign emphysema.  Surgical changes were seen in the left lower lobe.    Unfortunately, she had evidence of recurrence on CT chest in December of 2024, which revealed interval progression in the size and number of multiple bilateral small pulmonary nodules most consistent with progression of metastatic disease. We decided not to pursue a biopsy in view of the difficulty involved. Treatment options were discussed and she wished not to pursue chemotherapy unless absolutely necessary.  She was placed back on pembrolizumab  and had a repeat CT on 04/24/2023. CT chest describes progression of disease as the mediastinal and hilar nodes are mildly larger, the pulmonary nodules are minimally larger, and there is still a subpleural opacity which may be atelectasis or evolving scar tissue.  She also had a 3 mm tiny hypervascular focus in the anterior hepatic dome that was previously present and an 8 mm subcapsular low-density lesion medial right liver that is stable, but both are too small to characterize.  However, when I reviewed the images and measurements, the differences were minimal, so we continued the immunotherapy.  Repeat CT scan from 07/10/2023 again shows very mild progression with some increased mediastinal and bilateral hilar lymphadenopathy but minimal increases.  She has multiple bilateral pulmonary nodules but only up to 8 mm in size and so just mildly progressive.  She has a small left pleural effusion with mild pleural thickening.  I reviewed this information with her and once again we have opted to continue immunotherapy. CT chest done on 10/16/2023 revealed redemonstration of multiple bilateral lung nodules, no new lung mass or consolidation, redemonstration of multiple stable heterogeneous mediastinal, bilateral hilar, and pericardial lymph nodes. This was similar to her prior scan. There is smooth interlobular and intralobular septal thickening throughout bilateral lungs.. So we will continue the pembrolizumab  every 3 weeks.   Malignant neoplasm metastatic to lung [facility] CT chest in December 2024 revealed multiple bilateral pulmonary nodules which increased in size and number since imaging in August 2024.  These measure up to 7 mm in diameter.  There was an area of pleural plaque in the posteriomedial left upper lobe, measuring 2.9 cm.  Prior PD-L1 was positive at 90%, so she resumed pembrolizumab  in January.  We don't have the radiologist's report on her current CT but I reviewed the images with her and her son, and the change in disease seems minimal.  Now she has a repeat CT scan from 07/10/2023 which again shows very mild progression with some increased mediastinal and bilateral hilar lymphadenopathy but minimal increases.  She still has multiple bilateral pulmonary nodules but only up to 8  mm in size and so just mildly progressive.  She has a small left pleural effusion with mild pleural thickening.  I reviewed this information with her and once again we have opted to continue immunotherapy.    Eosinophilia She has had elevated eosinophil counts all along which I feel are related to her allergies but the eosinophil count was higher than usual at 26% with an absolute eosinophil count of 2.6. we will just continue to monitor this.    Hypothyroidism due to medication The TSH has fluctuated up and down.  She is currently on levothyroxine  75 mcg daily.  Her last TSH was slightly elevated at 5.322.  TSH is pending from today.  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  Her bilirubin has fluctuated up and down due to Gilbert's syndrome and isnormal today.  We will proceed with pembrolizumab  and continue to monitor this.   Hypertension She has been keeping a log of her blood pressure readings at home and it has been better than today. We got very high readings today so I have asked her to discuss treating her blood pressure with Dr. Jama.   Plan: She has a productive cough with thick, clear sputum and shortness of breath with exertion. She states that she quit smoking 11 days ago. Her blood pressure today was 163/100, repeated at 161/102 and again at 159/106. She states that she did have a high sodium meal about an hour prior to her visit. I encouraged her to keep a log at home to monitor her blood pressure and if it does not improve, we will consider medication. She has a WBC of 9.7, an elevated hemoglobin of 15.1 up from 13.9, and platelet count of 236,000. Her CMP is completely normal other than an elevated bilirubin of 1.4 up from 1.2. Her TSH and T4 are pending. Her day 1, cycle 14 of Keytruda  is scheduled for 11/10/2023. I will see her back in 3 weeks with CBC and CMP. The patient understands the plans discussed today and is in agreement with them.  She knows to contact our office if she develops concerns prior to her next appointment.  I provided 16 minutes of face-to-face time during this encounter and > 50% was spent counseling as documented under my  assessment and plan.   Sydney VEAR Cornish, MD  [facility] - A DEPT OF [facility] [address] [place] [address] Dept: [phone] Dept Fax: [phone]   No orders of the defined types were placed in this encounter.   CHIEF COMPLAINT:  CC: Metastatic non-small cell lung cancer  Current Treatment: Pembrolizumab  every 3 weeks  HISTORY OF PRESENT ILLNESS:   Oncology History  Non-small cell lung cancer, left (HCC)  01/15/2021 Initial Diagnosis   Non-small cell lung cancer, left (HCC)   02/12/2021 Cancer Staging   Staging form: Lung, AJCC 8th Edition - Clinical stage from 02/12/2021: Stage IA2 (cT1b, cN0, cM0) - Signed by Hunt Sydney VEAR, MD on 02/14/2021 Histopathologic type: Adenocarcinoma, NOS Stage prefix: Initial diagnosis Histologic grade (G): GX Histologic grading system: 4 grade system Laterality: Left Tumor size (mm): 19 Lymph-vascular invasion (LVI): LVI not present (absent)/not identified Diagnostic confirmation: Positive histology Specimen type: Bronchial Biopsy Staged by: Managing physician Stage used in treatment planning: Yes National guidelines used in treatment planning: Yes Type of national guideline used in treatment planning: NCCN Staging comments: Rec surgical resection   05/11/2021 Cancer  Staging   Staging form: Lung, AJCC 8th Edition - Pathologic stage from 05/11/2021: Stage IIB (ypT1c, pN1, cM0) - Signed by Cornelius Sydney DEL, MD on 11/12/2021 Histopathologic type: Adenocarcinoma, NOS Stage prefix: Post-therapy Histologic grade (G): G2 Histologic grading system: 4 grade system Residual tumor (R): R0 - None Laterality: Left Tumor size (mm): 25 Lymph-vascular invasion (LVI): LVI not present (absent)/not identified Diagnostic confirmation: Positive histology PLUS positive immunophenotyping and/or positive genetic studies Specimen type: Excision Staged by: Managing physician Type  of lung cancer: Surgically resected non-small cell lung cancer ECOG performance status: Grade 1 Perineural invasion (PNI): Absent Pleural/elastic layer invasion: PL0 Pleural lavage cytology: Unknown Weight loss: Absent Adequacy of mediastinal dissection: Adequate Radiotherapy dose: No Adjuvant radiation: No Adjuvant chemotherapy: Yes Stage used in treatment planning: Yes National guidelines used in treatment planning: Yes Type of national guideline used in treatment planning: NCCN Staging comments: Adjuvant pembrolizumab , PDL 1 90%   10/04/2021 - 10/01/2022 Chemotherapy   Patient is on Treatment Plan : LUNG NSCLC Pembrolizumab  Adj q21d x 18 cycles     02/04/2023 -  Chemotherapy   Patient is on Treatment Plan : LUNG NSCLC Pembrolizumab  (200) q21d      INTERVAL HISTORY:  Sydney Hunt is here today for repeat clinical assessment for her metastatic non-small cell lung cancer. Patient states that she feels okay, but complains of a productive cough with thick, clear sputum and shortness of breath with exertion. She states that she quit smoking 11 days ago. Her blood pressure today was 163/100, repeated at 161/102 and again at 159/106. She states that she did have a high sodium meal about an hour prior to her visit. I encouraged her to keep a log at home to monitor her blood pressure and if it does not improve, we will consider medication. She has a WBC of 9.7, an elevated hemoglobin of 15.1 up from 13.9, and platelet count of 236,000. Her CMP is completely normal other than an elevated bilirubin of 1.4 up from 1.2. Her TSH and T4 are pending. Her day 1, cycle 14 of Keytruda  is scheduled for 11/10/2023. I will see her back in 3 weeks with CBC and CMP.  She denies fever, chills, night sweats, or other signs of infection. She denies cardiorespiratory and gastrointestinal issues. She  denies pain. Her appetite is okay and Her weight has decreased 2 pounds over last 3 weeks.  REVIEW OF SYSTEMS:  Review of  Systems  Constitutional:  Negative for appetite change (ok), chills, diaphoresis, fatigue, fever and unexpected weight change.  HENT:  Negative.  Negative for hearing loss, lump/mass, mouth sores, nosebleeds, sore throat, tinnitus, trouble swallowing and voice change.   Eyes: Negative.   Respiratory:  Positive for cough (productive with clear sputum), shortness of breath (with exertion) and wheezing. Negative for chest tightness and hemoptysis.   Cardiovascular: Negative.  Negative for chest pain, leg swelling and palpitations.  Gastrointestinal: Negative.  Negative for abdominal distention, abdominal pain, blood in stool, constipation, diarrhea, nausea, rectal pain and vomiting.  Endocrine: Negative.  Negative for hot flashes.  Genitourinary:  Negative for bladder incontinence, difficulty urinating, dyspareunia, dysuria, frequency, hematuria, menstrual problem, nocturia, pelvic pain, vaginal bleeding and vaginal discharge.   Musculoskeletal:  Positive for back pain (3/10). Negative for arthralgias, flank pain, gait problem and myalgias.  Skin: Negative.  Negative for itching, rash and wound.  Neurological:  Negative for dizziness, extremity weakness, gait problem, headaches, light-headedness, numbness, seizures and speech difficulty.  Hematological: Negative.  Negative for adenopathy. Does not  bruise/bleed easily.  Psychiatric/Behavioral: Negative.  Negative for confusion, decreased concentration, depression, sleep disturbance and suicidal ideas. The patient is not nervous/anxious.    VITALS:  Blood pressure (!) 159/106, pulse 72, temperature 98.3 F (36.8 C), temperature source Oral, resp. rate 18, height 4' 11 (1.499 m), weight 92 lb 12.8 oz (42.1 kg), SpO2 95%.  Wt Readings from Last 3 Encounters:  11/10/23 93 lb (42.2 kg)  11/06/23 92 lb 12.8 oz (42.1 kg)  10/17/23 94 lb 8 oz (42.9 kg)    Body mass index is 18.74 kg/m.  Performance status (ECOG): 1 - Symptomatic but completely  ambulatory  PHYSICAL EXAM:  Physical Exam Vitals and nursing note reviewed.  Constitutional:      General: She is not in acute distress.    Appearance: Normal appearance.  HENT:     Head: Normocephalic and atraumatic.     Right Ear: Tympanic membrane, ear canal and external ear normal. There is no impacted cerumen.     Left Ear: Tympanic membrane, ear canal and external ear normal. There is no impacted cerumen.     Nose: Nose normal. No congestion or rhinorrhea.     Mouth/Throat:     Mouth: Mucous membranes are moist.     Pharynx: Oropharynx is clear. No oropharyngeal exudate or posterior oropharyngeal erythema.  Eyes:     General: No scleral icterus.       Right eye: No discharge.        Left eye: No discharge.     Extraocular Movements: Extraocular movements intact.     Conjunctiva/sclera: Conjunctivae normal.     Pupils: Pupils are equal, round, and reactive to light.  Cardiovascular:     Rate and Rhythm: Normal rate. Rhythm irregular.     Pulses: Normal pulses.     Heart sounds: Normal heart sounds. No murmur heard.    No friction rub. No gallop.  Pulmonary:     Effort: Pulmonary effort is normal.     Breath sounds: No transmitted upper airway sounds. Examination of the left-lower field reveals decreased breath sounds. Decreased breath sounds (mild) present. No wheezing, rhonchi or rales.  Abdominal:     General: Bowel sounds are normal. There is no distension.     Palpations: Abdomen is soft. There is no mass.     Tenderness: There is no abdominal tenderness. There is no right CVA tenderness, left CVA tenderness, guarding or rebound.     Hernia: No hernia is present.  Musculoskeletal:        General: Normal range of motion.     Cervical back: Normal range of motion and neck supple. No tenderness.     Right lower leg: No edema.     Left lower leg: No edema.  Lymphadenopathy:     Cervical: No cervical adenopathy.  Skin:    General: Skin is warm and dry.     Coloration:  Skin is not jaundiced.     Findings: No rash.  Neurological:     General: No focal deficit present.     Mental Status: She is alert and oriented to person, place, and time. Mental status is at baseline.     Cranial Nerves: No cranial nerve deficit.     Sensory: No sensory deficit.     Motor: No weakness.     Coordination: Coordination normal.     Gait: Gait normal.     Deep Tendon Reflexes: Reflexes normal.  Psychiatric:        Mood  and Affect: Mood normal.        Behavior: Behavior normal.        Thought Content: Thought content normal.        Judgment: Judgment normal.    LABS:      Latest Ref Rng & Units 11/06/2023    2:16 PM 10/16/2023    1:40 PM 09/25/2023    9:29 AM  CBC  WBC 4.0 - 10.5 K/uL 9.7  10.2  8.6   Hemoglobin 12.0 - 15.0 g/dL 84.8  86.0  85.8   Hematocrit 36.0 - 46.0 % 44.7  40.9  42.6   Platelets 150 - 400 K/uL 236  278  232       Latest Ref Rng & Units 11/06/2023    2:16 PM 10/16/2023    1:40 PM 09/25/2023    9:29 AM  CMP  Glucose 70 - 99 mg/dL 862  846  89   BUN 8 - 23 mg/dL 13  14  14    Creatinine 0.44 - 1.00 mg/dL 9.19  9.17  9.26   Sodium 135 - 145 mmol/L 137  138  138   Potassium 3.5 - 5.1 mmol/L 3.7  3.5  4.1   Chloride 98 - 111 mmol/L 102  101  101   CO2 22 - 32 mmol/L 27  27  26    Calcium 8.9 - 10.3 mg/dL 9.5  9.2  9.7   Total Protein 6.5 - 8.1 g/dL 8.0  7.2  7.5   Total Bilirubin 0.0 - 1.2 mg/dL 1.4  1.2  1.7   Alkaline Phos 38 - 126 U/L 101  80  68   AST 15 - 41 U/L 17  16  14    ALT 0 - 44 U/L 11  8  7       Lab Results  Component Value Date   CEA1 5.3 (H) 10/17/2022   CEA 4.2 01/13/2023   /  CEA  Date Value Ref Range Status  01/13/2023 4.2  Final  10/17/2022 5.3 (H) 0.0 - 4.7 ng/mL Final    Comment:    (NOTE)                             Nonsmokers          <3.9                             Smokers             <5.6 [employer] Electrochemiluminescence Immunoassay (ECLIA) Values obtained with different assay methods or  kits cannot be used interchangeably.  Results cannot be interpreted as absolute evidence of the presence or absence of malignant disease. Performed At: [facility] [address] [place], [place] [id number] Jennette Shorter MD Ey:[phone]     Lab Results  Component Value Date   LDH 130 11/28/2022    STUDIES:   No results found.   EXAM: 08/21/2023 MRI HEAD WITHOUT AND WITH CONTRAST IMPRESSION: 1. No evidence of intracranial metastatic disease. 2. Unremarkable MRI appearance of the brain for age. 3. Mild paranasal sinus disease as described.  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 Report date is 12/12/2020.     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   Leukocytosis 01/17/2022   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) 02/11/2023   Multiple bilateral pulmonary nodules up to 7 mm in diameter, increased in size and number  from prior study of August 2024     Migraines    Nodule of left lung 11/21/2020   Non-small cell lung cancer, left (HCC) 01/15/2021   Port-A-Cath in place 09/05/2022   Tobacco use 12/07/2020    Past Surgical History:  Procedure Laterality Date   ABDOMINAL HYSTERECTOMY  1989   unsure of vaginal or abdominal   BRONCHIAL BIOPSY  01/15/2021   Procedure: BRONCHIAL BIOPSIES;  Surgeon: Shelah Lamar RAMAN, MD;  Location: [facility] ENDOSCOPY;  Service: Pulmonary;;   BRONCHIAL BRUSHINGS  01/15/2021   Procedure: BRONCHIAL BRUSHINGS;  Surgeon: Shelah Lamar RAMAN, MD;  Location[facility] END[facility];  Service: Pulmonary;;   BRONCHIAL NEEDLE ASPIRATION BIOPSY  01/15/2021   Procedure: BRONCHIAL NEEDLE ASPIRATION BIOPSIES;  Surgeon: Shelah Lamar RAMAN, MD;  Location: [facility] ENDOSCOPY;  Service: Pulmonary;;   COLONOSCOPY  2022   FIDUCIAL MARKER PLACEMENT  01/15/2021   Procedure: FIDUCIAL MARKER PLACEMENT;  Surgeon: Shelah Lamar RAMAN, MD;  Location: [facility] ENDOSCOPY;  Service: Pulmonary;;   ovaries removed  1986   surgery to remove scar tissue     from colon/bladder in patient's late 30's   TUBAL LIGATION  1986   UPPER GI ENDOSCOPY     years ago in patient's late 14s   VIDEO BRONCHOSCOPY WITH RADIAL ENDOBRONCHIAL ULTRASOUND  01/15/2021   Procedure: VIDEO BRONCHOSCOPY WITH RADIAL ENDOBRONCHIAL ULTRASOUND;  Surgeon: Shelah Lamar RAMAN, MD;  Location: [facility] ENDOSCOPY;  Service: Pulmonary;;    Family History  Problem Relation Age of Onset   Pancreatic cancer Maternal Aunt 50   Brain cancer Maternal Uncle    Stomach cancer Maternal Uncle        dx. >50   Prostate cancer Half-Brother        passed away at 78   Prostate cancer Half-Brother     Social History:  reports that she quit smoking about 2 years ago. Her smoking use included cigarettes. She started smoking about 62 years ago. She has a 30.3 pack-year smoking history. She has never used smokeless tobacco. She reports current alcohol use of about 4.0 standard drinks of alcohol per  week. She reports current drug use. Frequency: 3.00 times per week. Drug: Marijuana.The patient is alone today.  Allergies:  Allergies  Allergen Reactions   Codeine Rash   Demerol [Meperidine Hcl] Other (See Comments)    coma   Meperidine Other (See Comments)    Several days will pass without patient being aware  Several  days will pass without patient being aware    coma   Other Dermatitis    Stainless steel / skin weeps   Silver Dermatitis    Sterling silver skin weeps   Gold Bond [Menthol-Zinc Oxide] Dermatitis    Skin weeps   Neosporin [Bacitracin-Polymyxin B] Dermatitis    Skin weeps   Wound Dressing Adhesive Other (See Comments)    CAUSES SKIN TEARS PER PATIENT. INSTEAD USE THE TEGADERM 10M WITH ADHESIVE FREE WINDOW OVER PORT.   Wound Dressings Other (See Comments)    CAUSES SKIN TEARS PER PATIENT. INSTEAD USE THE TEGADERM 10M WITH ADHESIVE FREE WINDOW OVER PORT.    Current Medications: Current Outpatient Medications  Medication Sig Dispense Refill   aspirin 81 MG EC tablet Take 81 mg by mouth daily.     atorvastatin (LIPITOR) 40 MG tablet Take 40 mg by mouth daily.     celecoxib (CELEBREX) 200 MG capsule Take 200 mg by mouth daily.     HYDROcodone -acetaminophen  (NORCO/VICODIN) 5-325 MG tablet Take 1 tablet by mouth every 6 (six) hours as needed for moderate pain (pain score 4-6) or severe pain (pain score 7-10). 30 tablet 0   levothyroxine  (SYNTHROID ) 75 MCG tablet TAKE 1 TABLET(75 MCG) BY MOUTH DAILY 30 tablet 5   lidocaine -prilocaine  (EMLA ) cream Apply 1 Application topically as needed. 30 g 0   LORazepam  (ATIVAN ) 1 MG tablet Take 1 tablet (1 mg total) by mouth every 8 (eight) hours. Use 1-2 tablets by mouth prior to MRI scan 30 tablet 0   ondansetron  (ZOFRAN ) 4 MG tablet Take 1 tablet (4 mg total) by mouth every 4 (four) hours as needed for nausea. 90 tablet 3   pantoprazole  (PROTONIX ) 40 MG tablet TAKE 1 TABLET(40 MG) BY MOUTH DAILY 60 tablet 1   Prenatal Vit-Fe  Fumarate-FA (PRENATAL VITAMIN) 27-0.8 MG TABS Take 1 tablet by mouth daily. 30 tablet 5   prochlorperazine  (COMPAZINE ) 10 MG tablet TAKE 1 TABLET(10 MG) BY MOUTH EVERY 6 HOURS AS NEEDED FOR NAUSEA OR VOMITING 360 tablet 0   VENTOLIN  HFA 108 (90 Base) MCG/ACT inhaler Inhale 1-2 puffs into the lungs as needed for wheezing or shortness of breath.     No current facility-administered medications for this visit.   Facility-Administered Medications Ordered in Other Visits  Medication Dose Route Frequency Provider Last Rate Last Admin   sodium chloride  flush (NS) 0.9 % injection 10 mL  10 mL Intracatheter PRN Cornelius Sydney DEL, MD   10 mL at 08/21/23 1042    I,Azjah Pardo H Arzell Mcgeehan,acting as a scribe for Sydney DEL Cornelius, MD.,have documented all relevant documentation on the behalf of Sydney DEL Cornelius, MD,as directed by  Sydney DEL Cornelius, MD while in the presence of Sydney DEL Cornelius, MD.  I have reviewed this report as typed by the medical scribe, and it is complete and accurate

## 2023-11-10 ENCOUNTER — Inpatient Hospital Stay

## 2023-11-10 VITALS — BP 155/91 | HR 77 | Temp 98.0°F | Resp 18 | Ht 59.0 in | Wt 93.0 lb

## 2023-11-10 DIAGNOSIS — C3492 Malignant neoplasm of unspecified part of left bronchus or lung: Secondary | ICD-10-CM

## 2023-11-10 DIAGNOSIS — Z5112 Encounter for antineoplastic immunotherapy: Secondary | ICD-10-CM | POA: Diagnosis not present

## 2023-11-10 MED ORDER — SODIUM CHLORIDE 0.9 % IV SOLN: INTRAVENOUS | Status: DC

## 2023-11-10 MED ORDER — SODIUM CHLORIDE 0.9 % IV SOLN
200.0000 mg | Freq: Once | INTRAVENOUS | Status: AC
  Administered 2023-11-10: 200 mg via INTRAVENOUS
  Filled 2023-11-10: qty 200

## 2023-11-10 MED ORDER — SODIUM CHLORIDE 0.9% FLUSH: 10.0000 mL | INTRAVENOUS | Status: DC | PRN

## 2023-11-10 NOTE — Patient Instructions (Signed)

## 2023-11-12 ENCOUNTER — Encounter: Payer: Self-pay | Admitting: Oncology

## 2023-11-17 ENCOUNTER — Encounter: Payer: Self-pay | Admitting: Oncology

## 2023-11-28 ENCOUNTER — Encounter: Payer: Self-pay | Admitting: Oncology

## 2023-11-28 ENCOUNTER — Inpatient Hospital Stay: Admitting: Oncology

## 2023-11-28 ENCOUNTER — Other Ambulatory Visit: Payer: Self-pay | Admitting: Hematology and Oncology

## 2023-11-28 ENCOUNTER — Inpatient Hospital Stay

## 2023-11-28 ENCOUNTER — Other Ambulatory Visit: Payer: Self-pay | Admitting: Oncology

## 2023-11-28 VITALS — BP 120/86 | HR 81 | Temp 98.0°F | Resp 18 | Ht 59.0 in | Wt 91.3 lb

## 2023-11-28 DIAGNOSIS — C7802 Secondary malignant neoplasm of left lung: Secondary | ICD-10-CM

## 2023-11-28 DIAGNOSIS — C3492 Malignant neoplasm of unspecified part of left bronchus or lung: Secondary | ICD-10-CM

## 2023-11-28 DIAGNOSIS — Z5112 Encounter for antineoplastic immunotherapy: Secondary | ICD-10-CM | POA: Diagnosis not present

## 2023-11-28 DIAGNOSIS — C7801 Secondary malignant neoplasm of right lung: Secondary | ICD-10-CM

## 2023-11-28 LAB — CBC WITH DIFFERENTIAL (CANCER CENTER ONLY)
Abs Immature Granulocytes: 0.01 K/uL (ref 0.00–0.07)
Basophils Absolute: 0.3 K/uL — ABNORMAL HIGH (ref 0.0–0.1)
Basophils Relative: 3 %
Eosinophils Absolute: 3.1 K/uL — ABNORMAL HIGH (ref 0.0–0.5)
Eosinophils Relative: 30 %
HCT: 43.4 % (ref 36.0–46.0)
Hemoglobin: 14.8 g/dL (ref 12.0–15.0)
Immature Granulocytes: 0 %
Lymphocytes Relative: 26 %
Lymphs Abs: 2.7 K/uL (ref 0.7–4.0)
MCH: 31.4 pg (ref 26.0–34.0)
MCHC: 34.1 g/dL (ref 30.0–36.0)
MCV: 92.1 fL (ref 80.0–100.0)
Monocytes Absolute: 0.9 K/uL (ref 0.1–1.0)
Monocytes Relative: 8 %
Neutro Abs: 3.4 K/uL (ref 1.7–7.7)
Neutrophils Relative %: 33 %
Platelet Count: 301 K/uL (ref 150–400)
RBC: 4.71 MIL/uL (ref 3.87–5.11)
RDW: 13.2 % (ref 11.5–15.5)
WBC Count: 10.3 K/uL (ref 4.0–10.5)
nRBC: 0 % (ref 0.0–0.2)

## 2023-11-28 LAB — CMP (CANCER CENTER ONLY)
ALT: 10 U/L (ref 0–44)
AST: 15 U/L (ref 15–41)
Albumin: 3.8 g/dL (ref 3.5–5.0)
Alkaline Phosphatase: 83 U/L (ref 38–126)
Anion gap: 7 (ref 5–15)
BUN: 13 mg/dL (ref 8–23)
CO2: 32 mmol/L (ref 22–32)
Calcium: 9.8 mg/dL (ref 8.9–10.3)
Chloride: 102 mmol/L (ref 98–111)
Creatinine: 0.74 mg/dL (ref 0.44–1.00)
GFR, Estimated: >60 mL/min (ref >60)
Glucose, Bld: 103 mg/dL — ABNORMAL HIGH (ref 70–99)
Potassium: 3.6 mmol/L (ref 3.5–5.1)
Sodium: 141 mmol/L (ref 135–145)
Total Bilirubin: 1.4 mg/dL — ABNORMAL HIGH (ref 0.0–1.2)
Total Protein: 7.6 g/dL (ref 6.5–8.1)

## 2023-11-28 LAB — TSH: TSH: 11.6 u[IU]/mL — ABNORMAL HIGH (ref 0.350–4.500)

## 2023-11-28 MED ORDER — HYDROCODONE-ACETAMINOPHEN 5-325 MG PO TABS: 1.0000 | ORAL_TABLET | Freq: Four times a day (QID) | ORAL | 0 refills | Status: AC | PRN

## 2023-11-28 NOTE — Progress Notes (Signed)
 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  She was treated with left lower lobectomy in [place] .  Her doctor there recommended chemotherapy with immunotherapy, but she refused chemotherapy.  PD-L1 was positive.  She was only willing to take the immunotherapy for 1 year. She started pembrolizumab  in August 2023.  We saw her in October 2023, when she returned to   and continued pembrolizumab  every 21 days. Routine CT imaging had not shown any evidence of recurrence.  She completed 17 cycles of adjuvant pembrolizumab  in August and CT chest following that was negative for recurrence. There were stable scattered small bilateral pulmonary nodules, felt to be from benign emphysema.  Surgical changes were seen in the left lower lobe.    Unfortunately, she had evidence of recurrence on CT chest in December of 2024, which revealed interval progression in the size and number of multiple bilateral small pulmonary nodules most consistent with progression of metastatic disease. We decided not to pursue a biopsy in view of the difficulty involved. Treatment options were discussed and she wished not to pursue chemotherapy unless absolutely necessary.  She was placed back on pembrolizumab  and had a repeat CT on 04/24/2023. CT chest describes progression of disease as the mediastinal and hilar nodes are mildly larger, the pulmonary nodules are minimally larger, and there is still a subpleural opacity which may be atelectasis or evolving scar tissue.  She also had a 3 mm tiny hypervascular focus in the anterior hepatic dome that was previously present and an 8 mm subcapsular low-density lesion medial right liver that is stable, but both are too small to  characterize. However, when I reviewed the images and measurements, the differences were minimal, so we continued the immunotherapy.  Repeat CT scan from 07/10/2023 again shows very mild progression with some increased mediastinal and bilateral hilar lymphadenopathy but minimal increases.  She has multiple bilateral pulmonary nodules but only up to 8 mm in size and so just mildly progressive.  She has a small left pleural effusion with mild pleural thickening.  I reviewed this information with her and once again we have opted to continue immunotherapy. CT chest done on 10/16/2023 revealed redemonstration of multiple bilateral lung nodules, no new lung mass or consolidation, redemonstration of multiple stable heterogeneous mediastinal, bilateral hilar, and pericardial lymph nodes. This was similar to her prior scan. There is smooth interlobular and intralobular septal thickening throughout bilateral lungs.. So we will continue the pembrolizumab  every 3 weeks and re-scan her at the end of the year.   Malignant neoplasm metastatic to lung [facility]) CT chest in December 2024 revealed multiple bilateral pulmonary nodules which increased in size and number since imaging in August 2024.  These measure up to 7 mm in diameter.  There was an area of pleural plaque in the posteriomedial left upper lobe, measuring 2.9 cm.  Prior PD-L1 was positive at 90%, so she resumed pembrolizumab  in January.  We don't have the radiologist's report on her current CT but I reviewed the images with her and her son, and the change in disease seems minimal.  Now she has a repeat CT scan from 07/10/2023 which again shows very mild progression with some increased mediastinal and bilateral hilar lymphadenopathy but minimal increases.  She still has  multiple bilateral pulmonary nodules but only up to 8 mm in size and so just mildly progressive.  She has a small left pleural effusion with mild pleural thickening.  I reviewed this information with her  and once again we have opted to continue immunotherapy.   Eosinophilia She has had elevated eosinophil counts all along which I feel are related to her allergies but the eosinophil count was higher than usual at 26% with an absolute eosinophil count of 2.6. we will just continue to monitor this.    Hypothyroidism due to medication The TSH has fluctuated up and down.  She is currently on levothyroxine  75 mcg daily.  Her last TSH was slightly elevated at 5.322.  TSH is pending from today.  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  Her bilirubin has fluctuated up and down due to Gilbert's syndrome and is normal today.  We will proceed with pembrolizumab  and continue to monitor this.     Plan: She has a worsening nonproductive cough accompanied by chest pain. She states that the cough is productive only when she smokes marijuana. She recently visited Dr. Jama who discontinued her Mucinex. She was prescribed Tessalon Perle for her cough, which has not seemed to alleviate her symptoms well. I will refill her 5-325 mg hydrocodone -acetaminophen  tablets to be taken every 6 hours PRN pain or cough. Sunday will be 28 days since she quit smoking tobacco. She has received her COVID and RSV vaccinations. She has a WBC of 10.3, hemoglobin of 14.8, and platelet count of 301,000. Her CMP is normal other than a stable elevated total bilirubin of 1.4. Her TSH and T4 are pending. Her day 1, cycle 15 of Keytruda  is scheduled for 12/01/2023. I will see her back in 3 weeks with CBC and CMP. We will plan CT chest for the end of the year. The patient understands the plans discussed today and is in agreement with them.  She knows to contact our office if she develops concerns prior to her next appointment.  I provided 17 minutes of face-to-face time during this encounter and > 50% was spent counseling as documented under my assessment and plan.   Sydney VEAR Cornish, MD    [facility] - A DEPT OF [facility] [address] [place] [address] Dept: [phone] Dept Fax: [phone]   No orders of the defined types were placed in this encounter.   CHIEF COMPLAINT:  CC: Metastatic non-small cell lung Sydney  Current Treatment: Pembrolizumab  every 3 weeks  HISTORY OF PRESENT ILLNESS:   Oncology History  Non-small cell lung Sydney, left (HCC)  01/15/2021 Initial Diagnosis   Non-small cell lung Sydney, left (HCC)   02/12/2021 Sydney Staging   Staging form: Lung, AJCC 8th Edition - Clinical stage from 02/12/2021: Stage IA2 (cT1b, cN0, cM0) - Signed by Hunt Sydney VEAR, MD on 02/14/2021 Histopathologic type: Adenocarcinoma, NOS Stage prefix: Initial diagnosis Histologic grade (G): GX Histologic grading system: 4 grade system Laterality: Left Tumor size (mm): 19 Lymph-vascular invasion (LVI): LVI not present (absent)/not identified Diagnostic confirmation: Positive histology Specimen type: Bronchial Biopsy Staged by: Managing physician Stage used in treatment planning: Yes National guidelines used in treatment planning: Yes Type of national guideline used in treatment planning: NCCN Staging comments: Rec surgical resection   05/11/2021 Sydney Staging   Staging form: Lung, AJCC 8th Edition - Pathologic stage from 05/11/2021: Stage IIB (ypT1c, pN1, cM0) - Signed  by Sydney Sydney DEL, MD on 11/12/2021 Histopathologic type: Adenocarcinoma, NOS Stage prefix: Post-therapy Histologic grade (G): G2 Histologic grading system: 4 grade system Residual tumor (R): R0 - None Laterality: Left Tumor size (mm): 25 Lymph-vascular invasion (LVI): LVI not present (absent)/not identified Diagnostic confirmation: Positive histology PLUS positive immunophenotyping and/or positive genetic studies Specimen type: Excision Staged by: Managing physician Type of lung Sydney: Surgically resected non-small cell lung  Sydney ECOG performance status: Grade 1 Perineural invasion (PNI): Absent Pleural/elastic layer invasion: PL0 Pleural lavage cytology: Unknown Weight loss: Absent Adequacy of mediastinal dissection: Adequate Radiotherapy dose: No Adjuvant radiation: No Adjuvant chemotherapy: Yes Stage used in treatment planning: Yes National guidelines used in treatment planning: Yes Type of national guideline used in treatment planning: NCCN Staging comments: Adjuvant pembrolizumab , PDL 1 90%   10/04/2021 - 10/01/2022 Chemotherapy   Patient is on Treatment Plan : LUNG NSCLC Pembrolizumab  Adj q21d x 18 cycles     02/04/2023 -  Chemotherapy   Patient is on Treatment Plan : LUNG NSCLC Pembrolizumab  (200) q21d      INTERVAL HISTORY:  Sydney Hunt is here today for repeat clinical assessment for her metastatic non-small cell lung Sydney. Patient states that she feels okay, but she has a worsening nonproductive cough accompanied by chest pain. She states that the cough is productive only when she smokes marijuana. She recently visited Dr. Jama who discontinued her Mucinex. She was prescribed Tessalon Perle for her cough, which has not seemed to alleviate her symptoms well. I will refill her 5-325 mg hydrocodone -acetaminophen  tablets to be taken every 6 hours PRN pain or cough. Sunday will be 28 days since she quit smoking tobacco. She has received her COVID and RSV vaccinations. She has a WBC of 10.3, hemoglobin of 14.8, and platelet count of 301,000. Her CMP is normal other than a stable elevated total bilirubin of 1.4. Her TSH and T4 are pending. Her day 1, cycle 15 of Keytruda  is scheduled for 12/01/2023. I will see her back in 3 weeks with CBC and CMP. We will plan CT chest for the end of the year.   She denies fever, chills, night sweats, or other signs of infection. She denies cardiorespiratory and gastrointestinal issues. She  denies pain. Her appetite is poor and Her weight has decreased 2 pounds over last 2  weeks.  REVIEW OF Hunt:  Review of Hunt  Constitutional:  Positive for appetite change (poor). Negative for chills, diaphoresis, fatigue, fever and unexpected weight change.  HENT:  Negative.  Negative for hearing loss, lump/mass, mouth sores, nosebleeds, sore throat, tinnitus, trouble swallowing and voice change.   Eyes: Negative.   Respiratory:  Positive for chest tightness, cough (productive with clear sputum), shortness of breath (with exertion) and wheezing. Negative for hemoptysis.   Cardiovascular: Negative.  Negative for chest pain, leg swelling and palpitations.  Gastrointestinal: Negative.  Negative for abdominal distention, abdominal pain, blood in stool, constipation, diarrhea, nausea, rectal pain and vomiting.  Endocrine: Negative.  Negative for hot flashes.  Genitourinary:  Negative for bladder incontinence, difficulty urinating, dyspareunia, dysuria, frequency, hematuria, menstrual problem, nocturia, pelvic pain, vaginal bleeding and vaginal discharge.   Musculoskeletal:  Positive for back pain (3/10). Negative for arthralgias, flank pain, gait problem and myalgias.  Skin: Negative.  Negative for itching, rash and wound.  Neurological:  Negative for dizziness, extremity weakness, gait problem, headaches, light-headedness, numbness, seizures and speech difficulty.  Hematological: Negative.  Negative for adenopathy. Does not bruise/bleed easily.  Psychiatric/Behavioral: Negative.  Negative for confusion,  decreased concentration, depression, sleep disturbance and suicidal ideas. The patient is not nervous/anxious.    VITALS:  Blood pressure 120/86, pulse 81, temperature 98 F (36.7 C), temperature source Oral, resp. rate 18, height 4' 11 (1.499 m), weight 91 lb 4.8 oz (41.4 kg), SpO2 93%.  Wt Readings from Last 3 Encounters:  12/01/23 91 lb 4 oz (41.4 kg)  11/28/23 91 lb 4.8 oz (41.4 kg)  11/10/23 93 lb (42.2 kg)    Body mass index is 18.44 kg/m.  Performance status  (ECOG): 1 - Symptomatic but completely ambulatory  PHYSICAL EXAM:  Physical Exam Vitals and nursing note reviewed.  Constitutional:      General: She is not in acute distress.    Appearance: Normal appearance.  HENT:     Head: Normocephalic and atraumatic.     Right Ear: Tympanic membrane, ear canal and external ear normal. There is no impacted cerumen.     Left Ear: Tympanic membrane, ear canal and external ear normal. There is no impacted cerumen.     Nose: Nose normal. No congestion or rhinorrhea.     Mouth/Throat:     Mouth: Mucous membranes are moist.     Pharynx: Oropharynx is clear. No oropharyngeal exudate or posterior oropharyngeal erythema.  Eyes:     General: No scleral icterus.       Right eye: No discharge.        Left eye: No discharge.     Extraocular Movements: Extraocular movements intact.     Conjunctiva/sclera: Conjunctivae normal.     Pupils: Pupils are equal, round, and reactive to light.  Cardiovascular:     Rate and Rhythm: Normal rate. Rhythm irregular.     Pulses: Normal pulses.     Heart sounds: Normal heart sounds. No murmur heard.    No friction rub. No gallop.  Pulmonary:     Effort: Pulmonary effort is normal.     Breath sounds: No transmitted upper airway sounds. Examination of the left-lower field reveals decreased breath sounds. Decreased breath sounds (mild) present. No wheezing, rhonchi or rales.  Abdominal:     General: Bowel sounds are normal. There is no distension.     Palpations: Abdomen is soft. There is no mass.     Tenderness: There is no abdominal tenderness. There is no right CVA tenderness, left CVA tenderness, guarding or rebound.     Hernia: No hernia is present.  Musculoskeletal:        General: Normal range of motion.     Cervical back: Normal range of motion and neck supple. No tenderness.     Right lower leg: No edema.     Left lower leg: No edema.  Lymphadenopathy:     Cervical: No cervical adenopathy.  Skin:    General:  Skin is warm and dry.     Coloration: Skin is not jaundiced.     Findings: No rash.  Neurological:     General: No focal deficit present.     Mental Status: She is alert and oriented to person, place, and time. Mental status is at baseline.     Cranial Nerves: No cranial nerve deficit.     Sensory: No sensory deficit.     Motor: No weakness.     Coordination: Coordination normal.     Gait: Gait normal.     Deep Tendon Reflexes: Reflexes normal.  Psychiatric:        Mood and Affect: Mood normal.  Behavior: Behavior normal.        Thought Content: Thought content normal.        Judgment: Judgment normal.    LABS:      Latest Ref Rng & Units 11/28/2023   10:10 AM 11/06/2023    2:16 PM 10/16/2023    1:40 PM  CBC  WBC 4.0 - 10.5 K/uL 10.3  9.7  10.2   Hemoglobin 12.0 - 15.0 g/dL 85.1  84.8  86.0   Hematocrit 36.0 - 46.0 % 43.4  44.7  40.9   Platelets 150 - 400 K/uL 301  236  278       Latest Ref Rng & Units 11/28/2023   10:10 AM 11/06/2023    2:16 PM 10/16/2023    1:40 PM  CMP  Glucose 70 - 99 mg/dL 896  862  846   BUN 8 - 23 mg/dL 13  13  14    Creatinine 0.44 - 1.00 mg/dL 9.25  9.19  9.17   Sodium 135 - 145 mmol/L 141  137  138   Potassium 3.5 - 5.1 mmol/L 3.6  3.7  3.5   Chloride 98 - 111 mmol/L 102  102  101   CO2 22 - 32 mmol/L 32  27  27   Calcium 8.9 - 10.3 mg/dL 9.8  9.5  9.2   Total Protein 6.5 - 8.1 g/dL 7.6  8.0  7.2   Total Bilirubin 0.0 - 1.2 mg/dL 1.4  1.4  1.2   Alkaline Phos 38 - 126 U/L 83  101  80   AST 15 - 41 U/L 15  17  16    ALT 0 - 44 U/L 10  11  8       Lab Results  Component Value Date   CEA1 5.3 (H) 10/17/2022   CEA 4.2 01/13/2023   /  CEA  Date Value Ref Range Status  01/13/2023 4.2  Final  10/17/2022 5.3 (H) 0.0 - 4.7 ng/mL Final    Comment:    (NOTE)                             Nonsmokers          <3.9                             Smokers             <5.6 [employer] Electrochemiluminescence Immunoassay (ECLIA) Values  obtained with different assay methods or kits cannot be used interchangeably.  Results cannot be interpreted as absolute evidence of the presence or absence of malignant disease. Performed At: [facility] [address] [place], [place] [id number] Jennette Shorter MD Ey:[phone]     Lab Results  Component Value Date   LDH 130 11/28/2022    STUDIES:  EXAM: 10/16/2023 CT CHEST WITH CONTRAST IMPRESSION: 1. Redemonstration of multiple bilateral lung nodules, essentially similar to the prior study. No new lung mass or consolidation. 2. Redemonstration of multiple stable, heterogeneous mediastinal, bilateral hilar and pericardial lymph nodes, favored metastatic. 3. There is smooth interlobular and intralobular septal thickening throughout bilateral lungs, suggesting congestive heart failure/pulmonary edema. 4. Multiple other nonacute observations, as described above.  EXAM: 08/21/2023 MRI HEAD WITHOUT AND WITH CONTRAST IMPRESSION: 1. No evidence of intracranial metastatic disease. 2. Unremarkable MRI appearance of the brain for age. 3. Mild paranasal sinus disease as described.  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 Report date is 12/12/2020.     The Multi-Sydney + RNA Panel offered by [facility] includes sequencing and/or deletion/duplication analysis of the following 84 genes:  AIP*, ALK, APC*, ATM*, AXIN2*, BAP1*, BARD1*, BLM*, BMPR1A*, BRCA1*, BRCA2*, BRIP1*, CASR, CDC73*,  CDH1*, CDK4, CDKN1B*, CDKN1C*, CDKN2A, CEBPA, CHEK2*, CTNNA1*, DICER1*, DIS3L2*, EGFR, EPCAM, FH   GERD (gastroesophageal reflux disease)    Gilbert's disease 07/22/2023   Counseled on dx 6/24     Heartburn 12/27/2021   Hormone disorder 11/20/2020   Hyperbilirubinemia 09/26/2022   Hypertension    Hypokalemia 12/27/2021   Hypothyroidism due to medication 01/22/2022   Leukocytosis 01/17/2022   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) 02/11/2023   Multiple bilateral pulmonary nodules up to 7 mm in diameter, increased in size and number from prior study of August 2024     Migraines    Nodule of left lung 11/21/2020   Non-small cell lung Sydney, left (HCC) 01/15/2021   Port-A-Cath in place 09/05/2022   Tobacco use 12/07/2020    Past Surgical History:  Procedure Laterality Date   ABDOMINAL HYSTERECTOMY  1989   unsure of vaginal or abdominal   BRONCHIAL BIOPSY  01/15/2021   Procedure: BRONCHIAL BIOPSIES;  Surgeon: Shelah Lamar RAMAN, MD;  Location: [facility] ENDOSCOPY;  Service: Pulmonary;;   BRONCHIAL BRUSHINGS  01/15/2021   Procedure: BRONCHIAL BRUSHINGS;  Surgeon: Shelah Lamar RAMAN, MD;  Location: [facility] ENDOSCOPY;  Service: Pulmonary;;   BRONCHIAL NEEDLE ASPIRATION BIOPSY  01/15/2021   Procedure: BRONCHIAL NEEDLE ASPIRATION BIOPSIES;  Surgeon: Shelah Lamar RAMAN, MD;  Location: [facility] ENDOSCOPY;  Service: Pulmonary;;   COLONOSCOPY  2022   FIDUCIAL MARKER PLACEMENT  01/15/2021   Procedure: FIDUCIAL MARKER PLACEMENT;  Surgeon: Shelah Lamar RAMAN, MD;  Location: [facility] ENDOSCOPY;  Service: Pulmonary;;   ovaries removed  1986   surgery to remove scar tissue     from colon/bladder in patient's late 30's   TUBAL LIGATION  1986   UPPER GI ENDOSCOPY     years ago in patient's late 83s   VIDEO BRONCHOSCOPY WITH RADIAL ENDOBRONCHIAL ULTRASOUND  01/15/2021   Procedure: VIDEO BRONCHOSCOPY WITH RADIAL ENDOBRONCHIAL ULTRASOUND;  Surgeon: Shelah Lamar RAMAN, MD;  Location: [facility] ENDOSCOPY;  Service: Pulmonary;;    Family  History  Problem Relation Age of Onset   Pancreatic Sydney Maternal Aunt 50   Brain Sydney Maternal Uncle    Stomach Sydney Maternal Uncle        dx. >50   Prostate Sydney Half-Brother        passed away at 57   Prostate Sydney Half-Brother     Social History:  reports that she quit smoking about 5 weeks ago. Her smoking use included cigarettes. She started smoking about 62 years ago. She has a 30.3 pack-year smoking history. She has never used smokeless tobacco. She reports current alcohol use of about 4.0 standard drinks of alcohol per week. She reports current drug use. Frequency: 3.00 times per week.  Drug: Marijuana.The patient is alone today.  Allergies:  Allergies  Allergen Reactions   Codeine Rash   Demerol [Meperidine Hcl] Other (See Comments)    coma   Meperidine Other (See Comments)    Several days will pass without patient being aware  Several days will pass without patient being aware    coma   Other Dermatitis    Stainless steel / skin weeps   Silver Dermatitis    Sterling silver skin weeps   Gold Bond [Menthol-Zinc Oxide] Dermatitis    Skin weeps   Neosporin [Bacitracin-Polymyxin B] Dermatitis    Skin weeps   Wound Dressing Adhesive Other (See Comments)    CAUSES SKIN TEARS PER PATIENT. INSTEAD USE THE TEGADERM 62M WITH ADHESIVE FREE WINDOW OVER PORT.   Wound Dressings Other (See Comments)    CAUSES SKIN TEARS PER PATIENT. INSTEAD USE THE TEGADERM 62M WITH ADHESIVE FREE WINDOW OVER PORT.    Current Medications: Current Outpatient Medications  Medication Sig Dispense Refill   amLODipine (NORVASC) 2.5 MG tablet Take 2.5 mg by mouth daily.     benzonatate (TESSALON) 200 MG capsule Take 200 mg by mouth 3 (three) times daily as needed.     temazepam (RESTORIL) 15 MG capsule Take 15 mg by mouth at bedtime as needed.     aspirin 81 MG EC tablet Take 81 mg by mouth daily.     atorvastatin (LIPITOR) 40 MG tablet Take 40 mg by mouth daily.     celecoxib (CELEBREX) 200  MG capsule Take 200 mg by mouth daily.     HYDROcodone -acetaminophen  (NORCO/VICODIN) 5-325 MG tablet Take 1 tablet by mouth every 6 (six) hours as needed for moderate pain (pain score 4-6) or severe pain (pain score 7-10). 60 tablet 0   levothyroxine  (SYNTHROID ) 100 MCG tablet Take 1 tablet (100 mcg total) by mouth daily before breakfast. 30 tablet 5   lidocaine -prilocaine  (EMLA ) cream Apply 1 Application topically as needed. 30 g 0   LORazepam  (ATIVAN ) 1 MG tablet Take 1 tablet (1 mg total) by mouth every 8 (eight) hours. Use 1-2 tablets by mouth prior to MRI scan 30 tablet 0   ondansetron  (ZOFRAN ) 4 MG tablet Take 1 tablet (4 mg total) by mouth every 4 (four) hours as needed for nausea. 90 tablet 3   pantoprazole  (PROTONIX ) 40 MG tablet TAKE 1 TABLET(40 MG) BY MOUTH DAILY 60 tablet 1   Prenatal Vit-Fe Fumarate-FA (PRENATAL VITAMIN) 27-0.8 MG TABS Take 1 tablet by mouth daily. 30 tablet 5   prochlorperazine  (COMPAZINE ) 10 MG tablet TAKE 1 TABLET(10 MG) BY MOUTH EVERY 6 HOURS AS NEEDED FOR NAUSEA OR VOMITING 360 tablet 0   VENTOLIN  HFA 108 (90 Base) MCG/ACT inhaler Inhale 1-2 puffs into the lungs as needed for wheezing or shortness of breath.     No current facility-administered medications for this visit.   Facility-Administered Medications Ordered in Other Visits  Medication Dose Route Frequency Provider Last Rate Last Admin   sodium chloride  flush (NS) 0.9 % injection 10 mL  10 mL Intracatheter PRN Sydney Sydney DEL, MD   10 mL at 08/21/23 1042    I,Dessire Grimes H Leyani Gargus,acting as a scribe for [facility] Hunt Cornelius, MD.,have documented all relevant documentation on the behalf of Sydney Hunt Cornelius, MD,as directed by  Sydney Hunt Cornelius, MD while in the presence of Sydney Hunt Cornelius, MD.  I have reviewed this report as typed by the medical scribe, and it is complete and accurate

## 2023-11-29 ENCOUNTER — Other Ambulatory Visit: Payer: Self-pay

## 2023-11-29 LAB — T4: T4, Total: 8.5 ug/dL (ref 4.5–12.0)

## 2023-12-01 ENCOUNTER — Inpatient Hospital Stay: Attending: Hematology and Oncology

## 2023-12-01 VITALS — BP 105/73 | HR 79 | Temp 98.2°F | Resp 18 | Ht 59.0 in | Wt 91.2 lb

## 2023-12-01 DIAGNOSIS — C3432 Malignant neoplasm of lower lobe, left bronchus or lung: Secondary | ICD-10-CM | POA: Insufficient documentation

## 2023-12-01 DIAGNOSIS — C3492 Malignant neoplasm of unspecified part of left bronchus or lung: Secondary | ICD-10-CM

## 2023-12-01 DIAGNOSIS — Z79899 Other long term (current) drug therapy: Secondary | ICD-10-CM | POA: Diagnosis not present

## 2023-12-01 DIAGNOSIS — Z5112 Encounter for antineoplastic immunotherapy: Secondary | ICD-10-CM | POA: Diagnosis present

## 2023-12-01 DIAGNOSIS — Z79620 Long term (current) use of immunosuppressive biologic: Secondary | ICD-10-CM | POA: Insufficient documentation

## 2023-12-01 MED ORDER — SODIUM CHLORIDE 0.9 % IV SOLN: INTRAVENOUS | Status: DC

## 2023-12-01 MED ORDER — SODIUM CHLORIDE 0.9 % IV SOLN
200.0000 mg | Freq: Once | INTRAVENOUS | Status: AC
  Administered 2023-12-01: 200 mg via INTRAVENOUS
  Filled 2023-12-01: qty 200

## 2023-12-01 NOTE — Patient Instructions (Signed)
 Pembrolizumab Injection What is this medication? PEMBROLIZUMAB (PEM broe LIZ ue mab) treats some types of cancer. It works by helping your immune system slow or stop the spread of cancer cells. It is a monoclonal antibody. This medicine may be used for other purposes; ask your health care provider or pharmacist if you have questions. COMMON BRAND NAME(S): Keytruda What should I tell my care team before I take this medication? They need to know if you have any of these conditions: Allogeneic stem cell transplant (uses someone else's stem cells) Autoimmune diseases, such as Crohn disease, ulcerative colitis, lupus History of chest radiation Nervous system problems, such as Guillain-Barre syndrome, myasthenia gravis Organ transplant An unusual or allergic reaction to pembrolizumab, other medications, foods, dyes, or preservatives Pregnant or trying to get pregnant Breast-feeding How should I use this medication? This medication is injected into a vein. It is given by your care team in a hospital or clinic setting. A special MedGuide will be given to you before each treatment. Be sure to read this information carefully each time. Talk to your care team about the use of this medication in children. While it may be prescribed for children as young as 6 months for selected conditions, precautions do apply. Overdosage: If you think you have taken too much of this medicine contact a poison control center or emergency room at once. NOTE: This medicine is only for you. Do not share this medicine with others. What if I miss a dose? Keep appointments for follow-up doses. It is important not to miss your dose. Call your care team if you are unable to keep an appointment. What may interact with this medication? Interactions have not been studied. This list may not describe all possible interactions. Give your health care provider a list of all the medicines, herbs, non-prescription drugs, or dietary  supplements you use. Also tell them if you smoke, drink alcohol, or use illegal drugs. Some items may interact with your medicine. What should I watch for while using this medication? Your condition will be monitored carefully while you are receiving this medication. You may need blood work while taking this medication. This medication may cause serious skin reactions. They can happen weeks to months after starting the medication. Contact your care team right away if you notice fevers or flu-like symptoms with a rash. The rash may be red or purple and then turn into blisters or peeling of the skin. You may also notice a red rash with swelling of the face, lips, or lymph nodes in your neck or under your arms. Tell your care team right away if you have any change in your eyesight. Talk to your care team if you may be pregnant. Serious birth defects can occur if you take this medication during pregnancy and for 4 months after the last dose. You will need a negative pregnancy test before starting this medication. Contraception is recommended while taking this medication and for 4 months after the last dose. Your care team can help you find the option that works for you. Do not breastfeed while taking this medication and for 4 months after the last dose. What side effects may I notice from receiving this medication? Side effects that you should report to your care team as soon as possible: Allergic reactions--skin rash, itching, hives, swelling of the face, lips, tongue, or throat Dry cough, shortness of breath or trouble breathing Eye pain, redness, irritation, or discharge with blurry or decreased vision Heart muscle inflammation--unusual weakness or  fatigue, shortness of breath, chest pain, fast or irregular heartbeat, dizziness, swelling of the ankles, feet, or hands Hormone gland problems--headache, sensitivity to light, unusual weakness or fatigue, dizziness, fast or irregular heartbeat, increased  sensitivity to cold or heat, excessive sweating, constipation, hair loss, increased thirst or amount of urine, tremors or shaking, irritability Infusion reactions--chest pain, shortness of breath or trouble breathing, feeling faint or lightheaded Kidney injury (glomerulonephritis)--decrease in the amount of urine, red or dark brown urine, foamy or bubbly urine, swelling of the ankles, hands, or feet Liver injury--right upper belly pain, loss of appetite, nausea, light-colored stool, dark yellow or brown urine, yellowing skin or eyes, unusual weakness or fatigue Pain, tingling, or numbness in the hands or feet, muscle weakness, change in vision, confusion or trouble speaking, loss of balance or coordination, trouble walking, seizures Rash, fever, and swollen lymph nodes Redness, blistering, peeling, or loosening of the skin, including inside the mouth Sudden or severe stomach pain, bloody diarrhea, fever, nausea, vomiting Side effects that usually do not require medical attention (report to your care team if they continue or are bothersome): Bone, joint, or muscle pain Diarrhea Fatigue Loss of appetite Nausea Skin rash This list may not describe all possible side effects. Call your doctor for medical advice about side effects. You may report side effects to FDA at 1-800-FDA-1088. Where should I keep my medication? This medication is given in a hospital or clinic. It will not be stored at home. NOTE: This sheet is a summary. It may not cover all possible information. If you have questions about this medicine, talk to your doctor, pharmacist, or health care provider.  2024 Elsevier/Gold Standard (2021-05-29 00:00:00) [facility] [place] - A DEPT OF [facility]  Discharge Instructions: Thank you for choosing [facility] to provide your oncology and hematology care.  If you have a lab appointment with the Cancer Center, please go directly to the Cancer Center  and check in at the registration area.   Wear comfortable clothing and clothing appropriate for easy access to any Portacath or PICC line.   We strive to give you quality time with your provider. You may need to reschedule your appointment if you arrive late (15 or more minutes).  Arriving late affects you and other patients whose appointments are after yours.  Also, if you miss three or more appointments without notifying the office, you may be dismissed from the clinic at the provider's discretion.      For prescription refill requests, have your pharmacy contact our office and allow 72 hours for refills to be completed.    Today you received the following chemotherapy and/or immunotherapy agents pembrolizumab      To help prevent nausea and vomiting after your treatment, we encourage you to take your nausea medication as directed.  BELOW ARE SYMPTOMS THAT SHOULD BE REPORTED IMMEDIATELY: *FEVER GREATER THAN 100.4 F (38 C) OR HIGHER *CHILLS OR SWEATING *NAUSEA AND VOMITING THAT IS NOT CONTROLLED WITH YOUR NAUSEA MEDICATION *UNUSUAL SHORTNESS OF BREATH *UNUSUAL BRUISING OR BLEEDING *URINARY PROBLEMS (pain or burning when urinating, or frequent urination) *BOWEL PROBLEMS (unusual diarrhea, constipation, pain near the anus) TENDERNESS IN MOUTH AND THROAT WITH OR WITHOUT PRESENCE OF ULCERS (sore throat, sores in mouth, or a toothache) UNUSUAL RASH, SWELLING OR PAIN  UNUSUAL VAGINAL DISCHARGE OR ITCHING   Items with * indicate a potential emergency and should be followed up as soon as possible or go to the Emergency Department if  problems should occur.  Please show the CHEMOTHERAPY ALERT CARD or IMMUNOTHERAPY ALERT CARD at check-in to the Emergency Department and triage nurse.  Should you have questions after your visit or need to cancel or reschedule your appointment, please contact [facility] [place] - A DEPT OF [facility]  Dept: [phone]  and follow the  prompts.  Office hours are 8:00 a.m. to 4:30 p.m. Monday - Friday. Please note that voicemails left after 4:00 p.m. may not be returned until the following business day.  We are closed weekends and major holidays. You have access to a nurse at all times for urgent questions. Please call the main number to the clinic Dept: [phone] and follow the prompts.  For any non-urgent questions, you may also contact your provider using MyChart. We now offer e-Visits for anyone 71 and older to request care online for non-urgent symptoms. For details visit [email or web address].   Also download the MyChart app! Go to the app store, search MyChart, open the app, select [place], and log in with your MyChart username and password.

## 2023-12-03 ENCOUNTER — Telehealth: Payer: Self-pay

## 2023-12-03 NOTE — Telephone Encounter (Signed)
 Called patient and notified her of the lab results and that Dr. Cornelius has sent a new RX to the pharmacy

## 2023-12-03 NOTE — Telephone Encounter (Signed)
-----   Message from Sydney Hunt sent at 12/02/2023  2:44 PM EST ----- Regarding: call Tell her thyroid  still abn.  I recommend we increase her thyroid  tp 100 mcg daily, will send in Rx

## 2023-12-09 ENCOUNTER — Other Ambulatory Visit: Payer: Self-pay | Admitting: Hematology and Oncology

## 2023-12-09 DIAGNOSIS — C3492 Malignant neoplasm of unspecified part of left bronchus or lung: Secondary | ICD-10-CM

## 2023-12-09 MED ORDER — LEVOTHYROXINE SODIUM 100 MCG PO TABS: 100.0000 ug | ORAL_TABLET | Freq: Every day | ORAL | 5 refills | Status: DC

## 2023-12-11 ENCOUNTER — Encounter: Payer: Self-pay | Admitting: Oncology

## 2023-12-15 ENCOUNTER — Telehealth: Payer: Self-pay

## 2023-12-15 NOTE — Telephone Encounter (Signed)
 Pt called to ask for appt to see Dr Cornelius or does she need to see her PCP? Pt reports increased cough over the last 2 days and increased SOB when walking. She states she doesn't get anything up with the cough, but it sounds wet (which I could hear while on phone with her). She hasn't taken her temp to see if she has a fever, but she doesn't think she has one. States Dr Jama took her off Mucinex because of her blood pressure, so she has only been taking tessalon Perles that he prescribed (which she said helps). I encouraged her to see PCP or Urgent Care today, as Dr Cornelius and Kelli,PA are not in clinic. She states she will call Dr Jama as soon as we get off phone.

## 2023-12-18 ENCOUNTER — Inpatient Hospital Stay

## 2023-12-18 ENCOUNTER — Encounter: Payer: Self-pay | Admitting: Oncology

## 2023-12-18 ENCOUNTER — Ambulatory Visit (INDEPENDENT_AMBULATORY_CARE_PROVIDER_SITE_OTHER)
Admission: RE | Admit: 2023-12-18 | Discharge: 2023-12-18 | Disposition: A | Discharge status: Home / Self Care | Source: Ambulatory Visit | Attending: Oncology | Admitting: Oncology

## 2023-12-18 ENCOUNTER — Telehealth: Payer: Self-pay

## 2023-12-18 ENCOUNTER — Other Ambulatory Visit: Payer: Self-pay | Admitting: Oncology

## 2023-12-18 ENCOUNTER — Inpatient Hospital Stay (HOSPITAL_BASED_OUTPATIENT_CLINIC_OR_DEPARTMENT_OTHER): Admitting: Oncology

## 2023-12-18 VITALS — BP 149/103 | HR 77 | Temp 98.1°F | Resp 18 | Ht 59.0 in | Wt 92.7 lb

## 2023-12-18 DIAGNOSIS — C7801 Secondary malignant neoplasm of right lung: Secondary | ICD-10-CM

## 2023-12-18 DIAGNOSIS — C7802 Secondary malignant neoplasm of left lung: Secondary | ICD-10-CM

## 2023-12-18 DIAGNOSIS — C3492 Malignant neoplasm of unspecified part of left bronchus or lung: Secondary | ICD-10-CM | POA: Diagnosis not present

## 2023-12-18 DIAGNOSIS — R051 Acute cough: Secondary | ICD-10-CM

## 2023-12-18 DIAGNOSIS — Z5112 Encounter for antineoplastic immunotherapy: Secondary | ICD-10-CM | POA: Diagnosis not present

## 2023-12-18 DIAGNOSIS — R059 Cough, unspecified: Secondary | ICD-10-CM | POA: Diagnosis not present

## 2023-12-18 LAB — CBC WITH DIFFERENTIAL (CANCER CENTER ONLY)
Abs Immature Granulocytes: 0.04 K/uL (ref 0.00–0.07)
Basophils Absolute: 0 K/uL (ref 0.0–0.1)
Basophils Relative: 0 %
Eosinophils Absolute: 0 K/uL (ref 0.0–0.5)
Eosinophils Relative: 0 %
HCT: 42.3 % (ref 36.0–46.0)
Hemoglobin: 14.1 g/dL (ref 12.0–15.0)
Immature Granulocytes: 0 %
Lymphocytes Relative: 17 %
Lymphs Abs: 2.1 K/uL (ref 0.7–4.0)
MCH: 31.6 pg (ref 26.0–34.0)
MCHC: 33.3 g/dL (ref 30.0–36.0)
MCV: 94.8 fL (ref 80.0–100.0)
Monocytes Absolute: 1 K/uL (ref 0.1–1.0)
Monocytes Relative: 8 %
Neutro Abs: 9.1 K/uL — ABNORMAL HIGH (ref 1.7–7.7)
Neutrophils Relative %: 75 %
Platelet Count: 307 K/uL (ref 150–400)
RBC: 4.46 MIL/uL (ref 3.87–5.11)
RDW: 13.7 % (ref 11.5–15.5)
WBC Count: 12.2 K/uL — ABNORMAL HIGH (ref 4.0–10.5)
nRBC: 0 % (ref 0.0–0.2)

## 2023-12-18 LAB — CMP (CANCER CENTER ONLY)
ALT: 11 U/L (ref 0–44)
AST: 16 U/L (ref 15–41)
Albumin: 3.6 g/dL (ref 3.5–5.0)
Alkaline Phosphatase: 54 U/L (ref 38–126)
Anion gap: 9 (ref 5–15)
BUN: 25 mg/dL — ABNORMAL HIGH (ref 8–23)
CO2: 25 mmol/L (ref 22–32)
Calcium: 9.8 mg/dL (ref 8.9–10.3)
Chloride: 104 mmol/L (ref 98–111)
Creatinine: 0.7 mg/dL (ref 0.44–1.00)
GFR, Estimated: >60 mL/min (ref >60)
Glucose, Bld: 123 mg/dL — ABNORMAL HIGH (ref 70–99)
Potassium: 3.9 mmol/L (ref 3.5–5.1)
Sodium: 138 mmol/L (ref 135–145)
Total Bilirubin: 1.2 mg/dL (ref 0.0–1.2)
Total Protein: 7.5 g/dL (ref 6.5–8.1)

## 2023-12-18 NOTE — Progress Notes (Signed)
 [facility]  [address] [place],  [place]  [address] [phone]  Clinic Day: 12/18/2023  Referring physician: Jama Chow, MD  ASSESSMENT & PLAN:  Assessment: Non-small cell lung cancer, left (HCC) Stage IIB adenocarcinoma of the left lung diagnosed in December 2022.  She was treated with left lower lobectomy in [place] .  Her doctor there recommended chemotherapy with immunotherapy, but she refused chemotherapy.  PD-L1 was positive.  She was only willing to take the immunotherapy for 1 year. She started pembrolizumab  in August 2023.  We saw her in October 2023, when she returned to [place]  and continued pembrolizumab  every 21 days. Routine CT imaging had not shown any evidence of recurrence.  She completed 17 cycles of adjuvant pembrolizumab  in August and CT chest following that was negative for recurrence. There were stable scattered small bilateral pulmonary nodules, felt to be from benign emphysema.  Surgical changes were seen in the left lower lobe.    Unfortunately, she had evidence of recurrence on CT chest in December of 2024, which revealed interval progression in the size and number of multiple bilateral small pulmonary nodules most consistent with progression of metastatic disease. We decided not to pursue a biopsy in view of the difficulty involved. Treatment options were discussed and she wished not to pursue chemotherapy unless absolutely necessary.  She was placed back on pembrolizumab  and had a repeat CT on 04/24/2023. CT chest describes progression of disease as the mediastinal and hilar nodes are mildly larger, the pulmonary nodules are minimally larger, and there is still a subpleural opacity which may be atelectasis or evolving scar tissue.  She also had a 3 mm tiny hypervascular focus in the anterior hepatic dome that was previously present and an 8 mm subcapsular low-density lesion medial right liver that is stable, but both are too small to characterize.  However, when I reviewed the images and measurements, the differences were minimal, so we continued the immunotherapy.  Repeat CT scan from 07/10/2023 again shows very mild progression with some increased mediastinal and bilateral hilar lymphadenopathy but minimal increases.  She has multiple bilateral pulmonary nodules but only up to 8 mm in size and so just mildly progressive.  She has a small left pleural effusion with mild pleural thickening.  I reviewed this information with her and once again we have opted to continue immunotherapy. CT chest done on 10/16/2023 revealed redemonstration of multiple bilateral lung nodules, no new lung mass or consolidation, redemonstration of multiple stable heterogeneous mediastinal, bilateral hilar, and pericardial lymph nodes. This was similar to her prior scan. There is smooth interlobular and intralobular septal thickening throughout bilateral lungs. So we will continue the pembrolizumab  every 3 weeks and re-scan her at the end of the year.   Malignant neoplasm metastatic to lung [facility]) CT chest in December 2024 revealed multiple bilateral pulmonary nodules which increased in size and number since imaging in August 2024.  These measure up to 7 mm in diameter.  There was an area of pleural plaque in the posteriomedial left upper lobe, measuring 2.9 cm.  Prior PD-L1 was positive at 90%, so she resumed pembrolizumab  in January.  We don't have the radiologist's report on her current CT but I reviewed the images with her and her son, and the change in disease seems minimal.  Now she has a repeat CT scan from 07/10/2023 which again shows very mild progression with some increased mediastinal and bilateral hilar lymphadenopathy but minimal increases.  She still has multiple  bilateral pulmonary nodules but only up to 8 mm in size and so just mildly progressive.  She has a small left pleural effusion with mild pleural thickening.  I reviewed this information with her and once again we  have opted to continue immunotherapy.   Eosinophilia She has had elevated eosinophil counts all along which I feel are related to her allergies but the eosinophil count was higher than usual at 26% with an absolute eosinophil count of 2.6. we will just continue to monitor this.    Hypothyroidism due to medication The TSH has fluctuated up and down.  She is currently on levothyroxine  75 mcg daily.  Her last TSH was slightly elevated at 5.322.  TSH is pending from today.  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  Her bilirubin has fluctuated up and down due to Gilbert's syndrome and is normal today.   Cough and congestion This is associated with increased dyspnea and chest pain.  I will send her for a chest x-ray today and postpone her immunotherapy to 12/1 instead of 11/24. She sees Dr. Jama again tomorrow.    Plan: She feels poorly and complains of a continued productive cough with clear sputum, dyspnea which is increased with exertion, and chest pain rated 5/10. She was previously prescribed Tessalon Perles, which she continues, and Mucinex, which she has since discontinued. Dr. Jama prescribed azithromycin  and 20 mg prednisone BID on 12/15/2023 which have not mitigated her symptoms as of today. She will see Dr. Jama again tomorrow. Upon physical exam, she has course rhonchi in the left upper lobe and mild wheezing in the left lower lobe. I will order a chest X-ray for today to evaluate for pneumonia. I will hold her pembrolizumab  treatment for 1 week and reschedule for 12/29/2023. Her blood pressure today is elevated at 160/85. Repeat blood pressure was 149/103. She is unsure whether or not she has taken her blood pressure medications this morning. She has an elevated WBC of 12.2 with an ANC of 9100 up from 10.3 with an ANC of 3400, hemoglobin of 14.1, and platelet count of 307,000.  Her CMP is normal other than an elevated BUN of 25, up from 13. Her TSH and T4 are  pending. She has a CT chest scheduled for 01/20/2024. I will see her back after her scan with CBC and CMP. The patient understands the plans discussed today and is in agreement with them.  She knows to contact our office if she develops concerns prior to her next appointment.  I provided 13 minutes of face-to-face time during this encounter and > 50% was spent counseling as documented under my assessment and plan.   [name], MD  [facility] DE[facility] OF [facility] [address] [place] [address] Dept: [phone] Dept Fax: [phone]   No orders of the defined types were placed in this encounter.   CHIEF COMPLAINT:  CC: Metastatic non-small cell lung cancer  Current Treatment: Pembrolizumab  every 3 weeks  HISTORY OF PRESENT ILLNESS:   Oncology History  Non-small cell lung cancer, left (HCC)  01/15/2021 Initial Diagnosis   Non-small cell lung cancer, left (HCC)   02/12/2021 Cancer Staging   Staging form: Lung, AJCC 8th Edition - Clinical stage from 02/12/2021: Stage IA2 (cT1b, cN0, cM0) - Signed by Cornish Wanda VEAR, MD on 02/14/2021 Histopathologic type: Adenocarcinoma, NOS Stage prefix: Initial diagnosis Histologic grade (G): GX  Histologic grading system: 4 grade system Laterality: Left Tumor size (mm): 19 Lymph-vascular invasion (LVI): LVI not present (absent)/not identified Diagnostic confirmation: Positive histology Specimen type: Bronchial Biopsy Staged by: Managing physician Stage used in treatment planning: Yes National guidelines used in treatment planning: Yes Type of national guideline used in treatment planning: NCCN Staging comments: Rec surgical resection   05/11/2021 Cancer Staging   Staging form: Lung, AJCC 8th Edition - Pathologic stage from 05/11/2021: Stage IIB (ypT1c, pN1, cM0) - Signed by Cornelius Wanda DEL, MD on 11/12/2021 Histopathologic type: Adenocarcinoma, NOS Stage  prefix: Post-therapy Histologic grade (G): G2 Histologic grading system: 4 grade system Residual tumor (R): R0 - None Laterality: Left Tumor size (mm): 25 Lymph-vascular invasion (LVI): LVI not present (absent)/not identified Diagnostic confirmation: Positive histology PLUS positive immunophenotyping and/or positive genetic studies Specimen type: Excision Staged by: Managing physician Type of lung cancer: Surgically resected non-small cell lung cancer ECOG performance status: Grade 1 Perineural invasion (PNI): Absent Pleural/elastic layer invasion: PL0 Pleural lavage cytology: Unknown Weight loss: Absent Adequacy of mediastinal dissection: Adequate Radiotherapy dose: No Adjuvant radiation: No Adjuvant chemotherapy: Yes Stage used in treatment planning: Yes National guidelines used in treatment planning: Yes Type of national guideline used in treatment planning: NCCN Staging comments: Adjuvant pembrolizumab , PDL 1 90%   10/04/2021 - 10/01/2022 Chemotherapy   Patient is on Treatment Plan : LUNG NSCLC Pembrolizumab  Adj q21d x 18 cycles     02/04/2023 -  Chemotherapy   Patient is on Treatment Plan : LUNG NSCLC Pembrolizumab  (200) q21d      INTERVAL HISTORY:  Sydney Hunt is here today for repeat clinical assessment for her metastatic non-small cell lung cancer, on maintenance therapy with pembrolizumab . Patient states that she feels poorly and complains of a continued productive cough with clear sputum, dyspnea which is increased with exertion, and chest pain rated 5/10. She was previously prescribed Tessalon Perles, which she continues, and Mucinex, which she has since discontinued. Dr. Jama prescribed azithromycin  and 20 mg prednisone BID on 12/15/2023 which have not mitigated her symptoms as of today. She will see Dr. Jama again tomorrow. Upon physical exam, she has course rhonchi in the left upper lobe and mild wheezing in the left lower lobe. I will order a chest X-ray for today to evaluate for  pneumonia. I will hold her pembrolizumab  treatment for 1 week and reschedule for 12/29/2023. Her blood pressure today is elevated at 160/85. Repeat blood pressure was 149/103. She is unsure whether or not she has taken her blood pressure medications this morning. She has an elevated WBC of 12.2 with an ANC of 9100 up from 10.3 with an ANC of 3400, hemoglobin of 14.1, and platelet count of 307,000.  Her CMP is normal other than an elevated BUN of 25, up from 13. Her TSH and T4 are pending. She has a CT chest scheduled for 01/20/2024. I will see her back after her scan with CBC and CMP.  She denies fever, chills, night sweats, or other signs of infection. She denies  gastrointestinal issues. Her appetite is not good and Her weight has been stable. She is 92 lb and 11.2 oz today.  REVIEW OF SYSTEMS:  Review of Systems  Constitutional:  Positive for appetite change (poor). Negative for chills, diaphoresis, fatigue, fever and unexpected weight change.  HENT:  Negative.  Negative for hearing loss, lump/mass, mouth sores, nosebleeds, sore throat, tinnitus, trouble swallowing and voice change.   Eyes: Negative.   Respiratory:  Positive for chest tightness (and  pain 5/10 related to cough), cough (productive with clear sputum), shortness of breath (with exertion) and wheezing. Negative for hemoptysis.   Cardiovascular: Negative.  Negative for chest pain, leg swelling and palpitations.  Gastrointestinal: Negative.  Negative for abdominal distention, abdominal pain, blood in stool, constipation, diarrhea, nausea, rectal pain and vomiting.  Endocrine: Negative.  Negative for hot flashes.  Genitourinary:  Negative for bladder incontinence, difficulty urinating, dyspareunia, dysuria, frequency, hematuria, menstrual problem, nocturia, pelvic pain, vaginal bleeding and vaginal discharge.   Musculoskeletal:  Negative for arthralgias, flank pain, gait problem and myalgias.  Skin: Negative.  Negative for itching, rash and  wound.  Neurological:  Negative for dizziness, extremity weakness, gait problem, headaches, light-headedness, numbness, seizures and speech difficulty.  Hematological: Negative.  Negative for adenopathy. Does not bruise/bleed easily.  Psychiatric/Behavioral: Negative.  Negative for confusion, decreased concentration, depression, sleep disturbance and suicidal ideas. The patient is not nervous/anxious.    VITALS:  Blood pressure (!) 149/103, pulse 77, temperature 98.1 F (36.7 C), temperature source Oral, resp. rate 18, height 4' 11 (1.499 m), weight 92 lb 11.2 oz (42 kg), SpO2 97%.  Wt Readings from Last 3 Encounters:  12/18/23 92 lb 11.2 oz (42 kg)  12/01/23 91 lb 4 oz (41.4 kg)  11/28/23 91 lb 4.8 oz (41.4 kg)    Body mass index is 18.72 kg/m.  Performance status (ECOG): 1 - Symptomatic but completely ambulatory  PHYSICAL EXAM:  Physical Exam Vitals and nursing note reviewed.  Constitutional:      General: She is not in acute distress.    Appearance: Normal appearance.  HENT:     Head: Normocephalic and atraumatic.     Right Ear: Tympanic membrane, ear canal and external ear normal. There is no impacted cerumen.     Left Ear: Tympanic membrane, ear canal and external ear normal. There is no impacted cerumen.     Nose: Nose normal. No congestion or rhinorrhea.     Mouth/Throat:     Mouth: Mucous membranes are moist.     Pharynx: Oropharynx is clear. No oropharyngeal exudate or posterior oropharyngeal erythema.  Eyes:     General: No scleral icterus.       Right eye: No discharge.        Left eye: No discharge.     Extraocular Movements: Extraocular movements intact.     Conjunctiva/sclera: Conjunctivae normal.     Pupils: Pupils are equal, round, and reactive to light.  Cardiovascular:     Rate and Rhythm: Normal rate and regular rhythm.     Pulses: Normal pulses.     Heart sounds: Normal heart sounds. No murmur heard.    No friction rub. No gallop.  Pulmonary:      Effort: Pulmonary effort is normal.     Breath sounds: No transmitted upper airway sounds. Examination of the left-upper field reveals rhonchi. Examination of the left-lower field reveals decreased breath sounds and wheezing. Decreased breath sounds (mild), wheezing (mild) and rhonchi (course) present. No rales.  Abdominal:     General: Bowel sounds are normal. There is no distension.     Palpations: Abdomen is soft. There is no mass.     Tenderness: There is no abdominal tenderness. There is no right CVA tenderness, left CVA tenderness, guarding or rebound.     Hernia: No hernia is present.  Musculoskeletal:        General: Normal range of motion.     Cervical back: Normal range of motion and neck supple.  No tenderness.     Right lower leg: No edema.     Left lower leg: No edema.  Lymphadenopathy:     Cervical: No cervical adenopathy.  Skin:    General: Skin is warm and dry.     Coloration: Skin is not jaundiced.     Findings: No rash.  Neurological:     General: No focal deficit present.     Mental Status: She is alert and oriented to person, place, and time. Mental status is at baseline.     Cranial Nerves: No cranial nerve deficit.     Sensory: No sensory deficit.     Motor: No weakness.     Coordination: Coordination normal.     Gait: Gait normal.     Deep Tendon Reflexes: Reflexes normal.  Psychiatric:        Mood and Affect: Mood normal.        Behavior: Behavior normal.        Thought Content: Thought content normal.        Judgment: Judgment normal.    LABS:      Latest Ref Rng & Units 12/18/2023   10:49 AM 11/28/2023   10:10 AM 11/06/2023    2:16 PM  CBC  WBC 4.0 - 10.5 K/uL 12.2  10.3  9.7   Hemoglobin 12.0 - 15.0 g/dL 85.8  85.1  84.8   Hematocrit 36.0 - 46.0 % 42.3  43.4  44.7   Platelets 150 - 400 K/uL 307  301  236       Latest Ref Rng & Units 12/18/2023   10:49 AM 11/28/2023   10:10 AM 11/06/2023    2:16 PM  CMP  Glucose 70 - 99 mg/dL 876  896  862    BUN 8 - 23 mg/dL 25  13  13    Creatinine 0.44 - 1.00 mg/dL 9.29  9.25  9.19   Sodium 135 - 145 mmol/L 138  141  137   Potassium 3.5 - 5.1 mmol/L 3.9  3.6  3.7   Chloride 98 - 111 mmol/L 104  102  102   CO2 22 - 32 mmol/L 25  32  27   Calcium 8.9 - 10.3 mg/dL 9.8  9.8  9.5   Total Protein 6.5 - 8.1 g/dL 7.5  7.6  8.0   Total Bilirubin 0.0 - 1.2 mg/dL 1.2  1.4  1.4   Alkaline Phos 38 - 126 U/L 54  83  101   AST 15 - 41 U/L 16  15  17    ALT 0 - 44 U/L 11  10  11       Lab Results  Component Value Date   CEA1 5.3 (H) 10/17/2022   CEA 4.2 01/13/2023   /  CEA  Date Value Ref Range Status  01/13/2023 4.2  Final  10/17/2022 5.3 (H) 0.0 - 4.7 ng/mL Final    Comment:    (NOTE)                             Nonsmokers          <3.9                             Smokers             <5.6 [employer] Electrochemiluminescence Immunoassay (ECLIA) Values obtained with different assay methods or kits cannot  be used interchangeably.  Results cannot be interpreted as absolute evidence of the presence or absence of malignant disease. Performed At: [facility] [address] [place], [place] [id number] Jennette Shorter MD Ey:[id number]    Lab Results  Component Value Date   TSH 11.600 (H) 11/28/2023   T4TOTAL 8.5 11/28/2023   Lab Results  Component Value Date   LDH 130 11/28/2022    STUDIES:  EXAM: 10/16/2023 CT CHEST WITH CONTRAST IMPRESSION: 1. Redemonstration of multiple bilateral lung nodules, essentially similar to the prior study. No new lung mass or consolidation. 2. Redemonstration of multiple stable, heterogeneous mediastinal, bilateral hilar and pericardial lymph nodes, favored metastatic. 3. There is smooth interlobular and intralobular septal thickening throughout bilateral lungs, suggesting congestive heart failure/pulmonary edema. 4. Multiple other nonacute observations, as described above.  EXAM: 08/21/2023 MRI HEAD WITHOUT AND WITH  CONTRAST IMPRESSION: 1. No evidence of intracranial metastatic disease. 2. Unremarkable MRI appearance of the brain for age. 3. Mild paranasal sinus disease as described.  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 Report date is 12/12/2020.     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   Leukocytosis 01/17/2022   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) 02/11/2023   Multiple bilateral pulmonary nodules up to 7 mm in diameter, increased in size and number from prior study of August 2024     Migraines    Nodule of left lung 11/21/2020   Non-small cell lung cancer, left (HCC) 01/15/2021   Port-A-Cath in place 09/05/2022   Tobacco use 12/07/2020    Past Surgical History:  Procedure Laterality Date   ABDOMINAL HYSTERECTOMY  1989   unsure of vaginal or abdominal   BRONCHIAL BIOPSY  01/15/2021   Procedure: BRONCHIAL BIOPSIES;  Surgeon: Shelah Lamar RAMAN, MD;  Location: [facility] ENDOSCOPY;  Service: Pulmonary;;   BRONCHIAL BRUSHINGS  01/15/2021   Procedure: BRONCHIAL BRUSHINGS;  Surgeon: Shelah Lamar RAMAN, MD;  Location[facility] ENDOSCOPY;  Service: Pulmonary;;   BRONCHIAL NEEDLE ASPIRATION BIOPSY  01/15/2021   Procedure: BRONCHIAL NEEDLE ASPIRATION BIOPSIES;  Surgeon: Shelah Lamar RAMAN, MD;  Location: [facility] ENDOSCOPY;  Service: Pulmonary;;   COLONOSCOPY  2022   FIDUCIAL MARKER PLACEMENT  01/15/2021   Procedure: FIDUCIAL MARKER PLACEMENT;  Surgeon: Shelah Lamar RAMAN, MD;  Location: [facility] ENDOSCOPY;  Service: Pulmonary;;   ovaries removed  1986   surgery to remove scar tissue     from colon/bladder in patient's late 30's   TUBAL LIGATION  1986   UPPER GI ENDOSCOPY     years ago in patient's late 44s   VIDEO BRONCHOSCOPY WITH RADIAL ENDOBRONCHIAL ULTRASOUND  01/15/2021   Procedure: VIDEO BRONCHOSCOPY WITH RADIAL ENDOBRONCHIAL ULTRASOUND;  Surgeon: Shelah Lamar RAMAN, MD;  Location: [facility] ENDOSCOPY;  Service: Pulmonary;;    Family History  Problem Relation Age of Onset   Pancreatic cancer Maternal Aunt 24   Brain cancer Maternal Uncle    Stomach cancer Maternal Uncle        dx. >50   Prostate cancer Half-Brother        passed away at 50   Prostate cancer Half-Brother     Social History:  reports that she quit smoking about 8 weeks ago. Her smoking use included cigarettes. She started smoking about 62 years ago. She has a 30.3 pack-year smoking history. She has never used smokeless tobacco. She reports current alcohol use of about 4.0 standard drinks of alcohol per week. She reports current drug use. Frequency: 3.00 times per week. Drug: Marijuana.The patient is alone today.  Allergies:  Allergies  Allergen Reactions   Codeine Rash   Demerol [Meperidine Hcl] Other (See Comments)    coma   Meperidine Other (See Comments)    Several days will pass without patient being aware  Several days will pass without patient being aware    coma   Other  Dermatitis    Stainless steel / skin weeps   Silver Dermatitis    Sterling silver skin weeps   Gold Bond [Menthol-Zinc Oxide] Dermatitis    Skin weeps   Neosporin [Bacitracin-Polymyxin B] Dermatitis    Skin weeps   Wound Dressing Adhesive Other (See Comments)    CAUSES SKIN TEARS PER PATIENT. INSTEAD USE THE TEGADERM 10M WITH ADHESIVE FREE WINDOW OVER PORT.   Wound Dressings Other (See Comments)    CAUSES SKIN TEARS PER PATIENT. INSTEAD USE THE TEGADERM 10M WITH ADHESIVE FREE WINDOW OVER PORT.    Current Medications: Current Outpatient Medications  Medication Sig Dispense Refill   azithromycin  (ZITHROMAX ) 250 MG tablet Take 250 mg by mouth as directed.     predniSONE (DELTASONE) 20 MG tablet Take 40 mg by mouth daily.     amLODipine (NORVASC) 2.5 MG tablet Take 2.5 mg by mouth daily.     aspirin 81 MG EC tablet Take 81 mg by mouth daily.     atorvastatin (LIPITOR) 40 MG tablet Take 40 mg by mouth daily.     benzonatate (TESSALON) 200 MG capsule Take 200 mg by mouth 3 (three) times daily as needed.     celecoxib (CELEBREX) 200 MG capsule Take 200 mg by mouth daily.     HYDROcodone -acetaminophen  (NORCO/VICODIN) 5-325 MG tablet Take 1 tablet by mouth every 6 (six) hours as needed for moderate pain (pain score 4-6) or severe pain (pain score 7-10). 60 tablet 0   levothyroxine  (SYNTHROID ) 100 MCG tablet Take 1 tablet (100 mcg total) by mouth daily before breakfast. 30 tablet 5   lidocaine -prilocaine  (EMLA ) cream Apply 1 Application topically as needed. 30 g 0   LORazepam  (ATIVAN ) 1 MG tablet Take 1 tablet (1 mg total) by mouth every 8 (eight) hours. Use 1-2 tablets by mouth prior to MRI scan 30 tablet 0   ondansetron  (ZOFRAN ) 4 MG tablet Take 1 tablet (4 mg total) by mouth every 4 (four) hours as needed for nausea. 90 tablet 3   pantoprazole  (PROTONIX ) 40 MG tablet TAKE 1 TABLET(40 MG) BY MOUTH DAILY 180 tablet 0  Prenatal Vit-Fe Fumarate-FA (PRENATAL VITAMIN) 27-0.8 MG TABS Take 1 tablet by  mouth daily. 30 tablet 5   prochlorperazine  (COMPAZINE ) 10 MG tablet TAKE 1 TABLET(10 MG) BY MOUTH EVERY 6 HOURS AS NEEDED FOR NAUSEA OR VOMITING 360 tablet 0   temazepam (RESTORIL) 15 MG capsule Take 15 mg by mouth at bedtime as needed.     VENTOLIN  HFA 108 (90 Base) MCG/ACT inhaler Inhale 1-2 puffs into the lungs as needed for wheezing or shortness of breath.     No current facility-administered medications for this visit.   Facility-Administered Medications Ordered in Other Visits  Medication Dose Route Frequency Provider Last Rate Last Admin   sodium chloride  flush (NS) 0.9 % injection 10 mL  10 mL Intracatheter PRN Cornelius Wanda DEL, MD   10 mL at 08/21/23 1042    I,Andre Gallego H Cristin Penaflor,acting as a scribe for Wanda DEL Cornelius, MD.,have documented all relevant documentation on the behalf of Wanda DEL Cornelius, MD,as directed by  Wanda DEL Cornelius, MD while in the presence of Wanda DEL Cornelius, MD.  I have reviewed this report as typed by the medical scribe, and it is complete and accurate

## 2023-12-18 NOTE — Telephone Encounter (Signed)
-----   Message from [name] sent at 12/18/2023  2:53 PM EST ----- Regarding: call Tell her I don't see any definite pneumonia by my eyes but will see what the radiologist says.  When report available, let me know and send copy to Dr. Jama, she sees him in the morning

## 2023-12-18 NOTE — Telephone Encounter (Signed)
Patient called and notified of message.

## 2023-12-20 ENCOUNTER — Other Ambulatory Visit: Payer: Self-pay | Admitting: Hematology and Oncology

## 2023-12-20 DIAGNOSIS — K219 Gastro-esophageal reflux disease without esophagitis: Secondary | ICD-10-CM

## 2023-12-22 ENCOUNTER — Encounter: Payer: Self-pay | Admitting: Oncology

## 2023-12-22 ENCOUNTER — Inpatient Hospital Stay

## 2023-12-29 ENCOUNTER — Encounter: Payer: Self-pay | Admitting: Oncology

## 2023-12-30 ENCOUNTER — Inpatient Hospital Stay: Attending: Hematology and Oncology

## 2023-12-30 ENCOUNTER — Encounter: Payer: Self-pay | Admitting: Oncology

## 2023-12-30 ENCOUNTER — Inpatient Hospital Stay: Admitting: Hematology and Oncology

## 2023-12-30 ENCOUNTER — Inpatient Hospital Stay

## 2023-12-30 ENCOUNTER — Encounter: Payer: Self-pay | Admitting: Hematology and Oncology

## 2023-12-30 ENCOUNTER — Other Ambulatory Visit: Payer: Self-pay

## 2023-12-30 VITALS — BP 90/69 | HR 73 | Temp 98.5°F | Resp 18 | Ht 59.0 in | Wt 91.5 lb

## 2023-12-30 DIAGNOSIS — C3492 Malignant neoplasm of unspecified part of left bronchus or lung: Secondary | ICD-10-CM

## 2023-12-30 DIAGNOSIS — Z87891 Personal history of nicotine dependence: Secondary | ICD-10-CM | POA: Diagnosis not present

## 2023-12-30 DIAGNOSIS — Z7982 Long term (current) use of aspirin: Secondary | ICD-10-CM | POA: Diagnosis not present

## 2023-12-30 DIAGNOSIS — Z79899 Other long term (current) drug therapy: Secondary | ICD-10-CM | POA: Diagnosis not present

## 2023-12-30 DIAGNOSIS — Z90722 Acquired absence of ovaries, bilateral: Secondary | ICD-10-CM | POA: Insufficient documentation

## 2023-12-30 DIAGNOSIS — Z808 Family history of malignant neoplasm of other organs or systems: Secondary | ICD-10-CM | POA: Diagnosis not present

## 2023-12-30 DIAGNOSIS — C7801 Secondary malignant neoplasm of right lung: Secondary | ICD-10-CM

## 2023-12-30 DIAGNOSIS — Z79890 Hormone replacement therapy (postmenopausal): Secondary | ICD-10-CM | POA: Diagnosis not present

## 2023-12-30 DIAGNOSIS — Z885 Allergy status to narcotic agent status: Secondary | ICD-10-CM | POA: Diagnosis not present

## 2023-12-30 DIAGNOSIS — J90 Pleural effusion, not elsewhere classified: Secondary | ICD-10-CM | POA: Diagnosis not present

## 2023-12-30 DIAGNOSIS — F1290 Cannabis use, unspecified, uncomplicated: Secondary | ICD-10-CM | POA: Diagnosis not present

## 2023-12-30 DIAGNOSIS — Z79620 Long term (current) use of immunosuppressive biologic: Secondary | ICD-10-CM | POA: Insufficient documentation

## 2023-12-30 DIAGNOSIS — Z800 Family history of malignant neoplasm of digestive organs: Secondary | ICD-10-CM | POA: Insufficient documentation

## 2023-12-30 DIAGNOSIS — E032 Hypothyroidism due to medicaments and other exogenous substances: Secondary | ICD-10-CM

## 2023-12-30 DIAGNOSIS — Z90710 Acquired absence of both cervix and uterus: Secondary | ICD-10-CM | POA: Diagnosis not present

## 2023-12-30 DIAGNOSIS — C3432 Malignant neoplasm of lower lobe, left bronchus or lung: Secondary | ICD-10-CM | POA: Insufficient documentation

## 2023-12-30 DIAGNOSIS — Z881 Allergy status to other antibiotic agents status: Secondary | ICD-10-CM | POA: Diagnosis not present

## 2023-12-30 DIAGNOSIS — Z5112 Encounter for antineoplastic immunotherapy: Secondary | ICD-10-CM | POA: Insufficient documentation

## 2023-12-30 DIAGNOSIS — M2550 Pain in unspecified joint: Secondary | ICD-10-CM | POA: Insufficient documentation

## 2023-12-30 DIAGNOSIS — Z8042 Family history of malignant neoplasm of prostate: Secondary | ICD-10-CM | POA: Diagnosis not present

## 2023-12-30 DIAGNOSIS — I10 Essential (primary) hypertension: Secondary | ICD-10-CM | POA: Insufficient documentation

## 2023-12-30 DIAGNOSIS — R059 Cough, unspecified: Secondary | ICD-10-CM | POA: Diagnosis not present

## 2023-12-30 LAB — CBC WITH DIFFERENTIAL (CANCER CENTER ONLY)
Abs Immature Granulocytes: 0.04 K/uL (ref 0.00–0.07)
Basophils Absolute: 0.1 K/uL (ref 0.0–0.1)
Basophils Relative: 1 %
Eosinophils Absolute: 0.7 K/uL — ABNORMAL HIGH (ref 0.0–0.5)
Eosinophils Relative: 5 %
HCT: 41.3 % (ref 36.0–46.0)
Hemoglobin: 13.6 g/dL (ref 12.0–15.0)
Immature Granulocytes: 0 %
Lymphocytes Relative: 25 %
Lymphs Abs: 3.3 K/uL (ref 0.7–4.0)
MCH: 31.9 pg (ref 26.0–34.0)
MCHC: 32.9 g/dL (ref 30.0–36.0)
MCV: 96.9 fL (ref 80.0–100.0)
Monocytes Absolute: 1 K/uL (ref 0.1–1.0)
Monocytes Relative: 7 %
Neutro Abs: 8.1 K/uL — ABNORMAL HIGH (ref 1.7–7.7)
Neutrophils Relative %: 62 %
Platelet Count: 335 K/uL (ref 150–400)
RBC: 4.26 MIL/uL (ref 3.87–5.11)
RDW: 14.1 % (ref 11.5–15.5)
WBC Count: 13.2 K/uL — ABNORMAL HIGH (ref 4.0–10.5)
nRBC: 0 % (ref 0.0–0.2)

## 2023-12-30 LAB — CMP (CANCER CENTER ONLY)
ALT: 11 U/L (ref 0–44)
AST: 16 U/L (ref 15–41)
Albumin: 3.6 g/dL (ref 3.5–5.0)
Alkaline Phosphatase: 54 U/L (ref 38–126)
Anion gap: 8 (ref 5–15)
BUN: 17 mg/dL (ref 8–23)
CO2: 27 mmol/L (ref 22–32)
Calcium: 9.5 mg/dL (ref 8.9–10.3)
Chloride: 105 mmol/L (ref 98–111)
Creatinine: 0.77 mg/dL (ref 0.44–1.00)
GFR, Estimated: >60 mL/min (ref >60)
Glucose, Bld: 92 mg/dL (ref 70–99)
Potassium: 4.3 mmol/L (ref 3.5–5.1)
Sodium: 140 mmol/L (ref 135–145)
Total Bilirubin: 2 mg/dL — ABNORMAL HIGH (ref 0.0–1.2)
Total Protein: 6.6 g/dL (ref 6.5–8.1)

## 2023-12-30 MED ORDER — SODIUM CHLORIDE 0.9 % IV SOLN: INTRAVENOUS | Status: DC

## 2023-12-30 MED ORDER — SODIUM CHLORIDE 0.9 % IV SOLN
200.0000 mg | Freq: Once | INTRAVENOUS | Status: AC
  Administered 2023-12-30: 200 mg via INTRAVENOUS
  Filled 2023-12-30: qty 200

## 2023-12-30 NOTE — Assessment & Plan Note (Signed)
 CT chest in December 2024 revealed multiple bilateral pulmonary nodules which increased in size and number since imaging in August 2024.  These measure up to 7 mm in diameter.  There was an area of pleural plaque in the posteriomedial left upper lobe, measuring 2.9 cm.  Prior PD-L1 was positive at 90%, so she resumed pembrolizumab  in January 2025.

## 2023-12-30 NOTE — Assessment & Plan Note (Signed)
 The TSH has fluctuated up and down.  The TSH was back to 11.6 in October, so we recommended increasing the levothyroxine  to 100 mcg daily.  TSH is pending from today.

## 2023-12-30 NOTE — Assessment & Plan Note (Addendum)
 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  She was treated with left lower lobectomy in [place] .  Her doctor there recommended chemotherapy with immunotherapy, but she refused chemotherapy.  PD-L1 was positive.  She was only willing to take the immunotherapy for 1 year. She started pembrolizumab  in August 2023.  We saw her in October 2023, when she returned to [place]  and continued pembrolizumab  every 21 days. Routine CT imaging had not shown any evidence of recurrence.  She completed 17 cycles of adjuvant pembrolizumab  in August and CT chest following that was negative for recurrence. There were stable scattered small bilateral pulmonary nodules, felt to be from benign emphysema.  Surgical changes were seen in the left lower lobe.   Unfortunately, she had evidence of recurrence on CT chest in December 2024 with interval progression in the size and number of multiple bilateral pulmonary nodules most consistent with progression of metastatic disease. We decided not to pursue a biopsy in view of the difficulty involved. Treatment options were discussed and she wished not to pursue chemotherapy unless absolutely necessary.  She was placed back on pembrolizumab .  CT imaging in June revealed very mild progression with some increased mediastinal and bilateral hilar lymphadenopathy. There were multiple bilateral pulmonary nodules measuring up to 8 mm in size, so just mildly progressive. There was a small left pleural effusion with mild pleural thickening. After discussion with the patient, we opted to continue immunotherapy due to the extremely slow progression of her disease.  She wishes to avoid IV chemotherapy if possible.  She understands chemotherapy probably will be necessary in the future.   Repeat CT imaging in September revealed stable disease.  She had worsening cough treated with antibiotics and steroids without much improvement.  Her 16th cycle of pembrolizumab  was  held last week.  Chest x-ray revealed stable interstitial prominence.  She saw Dr. Jama again and was started on Trelegy with significant improvement in her cough.  She will proceed with a 16th cycle of pembrolizumab  today.  We will plan to see her back in 3 weeks with a CBC, comprehensive metabolic panel, TSH and CT chest to reassess her disease baseline prior to her a 17th cycle of pembrolizumab .

## 2023-12-30 NOTE — Patient Instructions (Signed)
 Pembrolizumab Injection What is this medication? PEMBROLIZUMAB (PEM broe LIZ ue mab) treats some types of cancer. It works by helping your immune system slow or stop the spread of cancer cells. It is a monoclonal antibody. This medicine may be used for other purposes; ask your health care provider or pharmacist if you have questions. COMMON BRAND NAME(S): Keytruda What should I tell my care team before I take this medication? They need to know if you have any of these conditions: Allogeneic stem cell transplant (uses someone else's stem cells) Autoimmune diseases, such as Crohn disease, ulcerative colitis, lupus History of chest radiation Nervous system problems, such as Guillain-Barre syndrome, myasthenia gravis Organ transplant An unusual or allergic reaction to pembrolizumab, other medications, foods, dyes, or preservatives Pregnant or trying to get pregnant Breast-feeding How should I use this medication? This medication is injected into a vein. It is given by your care team in a hospital or clinic setting. A special MedGuide will be given to you before each treatment. Be sure to read this information carefully each time. Talk to your care team about the use of this medication in children. While it may be prescribed for children as young as 6 months for selected conditions, precautions do apply. Overdosage: If you think you have taken too much of this medicine contact a poison control center or emergency room at once. NOTE: This medicine is only for you. Do not share this medicine with others. What if I miss a dose? Keep appointments for follow-up doses. It is important not to miss your dose. Call your care team if you are unable to keep an appointment. What may interact with this medication? Interactions have not been studied. This list may not describe all possible interactions. Give your health care provider a list of all the medicines, herbs, non-prescription drugs, or dietary  supplements you use. Also tell them if you smoke, drink alcohol, or use illegal drugs. Some items may interact with your medicine. What should I watch for while using this medication? Your condition will be monitored carefully while you are receiving this medication. You may need blood work while taking this medication. This medication may cause serious skin reactions. They can happen weeks to months after starting the medication. Contact your care team right away if you notice fevers or flu-like symptoms with a rash. The rash may be red or purple and then turn into blisters or peeling of the skin. You may also notice a red rash with swelling of the face, lips, or lymph nodes in your neck or under your arms. Tell your care team right away if you have any change in your eyesight. Talk to your care team if you may be pregnant. Serious birth defects can occur if you take this medication during pregnancy and for 4 months after the last dose. You will need a negative pregnancy test before starting this medication. Contraception is recommended while taking this medication and for 4 months after the last dose. Your care team can help you find the option that works for you. Do not breastfeed while taking this medication and for 4 months after the last dose. What side effects may I notice from receiving this medication? Side effects that you should report to your care team as soon as possible: Allergic reactions--skin rash, itching, hives, swelling of the face, lips, tongue, or throat Dry cough, shortness of breath or trouble breathing Eye pain, redness, irritation, or discharge with blurry or decreased vision Heart muscle inflammation--unusual weakness or  fatigue, shortness of breath, chest pain, fast or irregular heartbeat, dizziness, swelling of the ankles, feet, or hands Hormone gland problems--headache, sensitivity to light, unusual weakness or fatigue, dizziness, fast or irregular heartbeat, increased  sensitivity to cold or heat, excessive sweating, constipation, hair loss, increased thirst or amount of urine, tremors or shaking, irritability Infusion reactions--chest pain, shortness of breath or trouble breathing, feeling faint or lightheaded Kidney injury (glomerulonephritis)--decrease in the amount of urine, red or dark brown urine, foamy or bubbly urine, swelling of the ankles, hands, or feet Liver injury--right upper belly pain, loss of appetite, nausea, light-colored stool, dark yellow or brown urine, yellowing skin or eyes, unusual weakness or fatigue Pain, tingling, or numbness in the hands or feet, muscle weakness, change in vision, confusion or trouble speaking, loss of balance or coordination, trouble walking, seizures Rash, fever, and swollen lymph nodes Redness, blistering, peeling, or loosening of the skin, including inside the mouth Sudden or severe stomach pain, bloody diarrhea, fever, nausea, vomiting Side effects that usually do not require medical attention (report to your care team if they continue or are bothersome): Bone, joint, or muscle pain Diarrhea Fatigue Loss of appetite Nausea Skin rash This list may not describe all possible side effects. Call your doctor for medical advice about side effects. You may report side effects to FDA at 1-800-FDA-1088. Where should I keep my medication? This medication is given in a hospital or clinic. It will not be stored at home. NOTE: This sheet is a summary. It may not cover all possible information. If you have questions about this medicine, talk to your doctor, pharmacist, or health care provider.  2024 Elsevier/Gold Standard (2021-05-29 00:00:00)  [facility] [place] [facility]  Discharge Instructions: Thank you for choosing [facility] to provide your oncology and hematology care.  If you have a lab appointment with the Cancer Center, please go directly to the Cancer Center  and check in at the registration area.   Wear comfortable clothing and clothing appropriate for easy access to any Portacath or PICC line.   We strive to give you quality time with your provider. You may need to reschedule your appointment if you arrive late (15 or more minutes).  Arriving late affects you and other patients whose appointments are after yours.  Also, if you miss three or more appointments without notifying the office, you may be dismissed from the clinic at the provider's discretion.      For prescription refill requests, have your pharmacy contact our office and allow 72 hours for refills to be completed.    Today you received the following chemotherapy and/or immunotherapy agents PEMBROLIZUMAB      To help prevent nausea and vomiting after your treatment, we encourage you to take your nausea medication as directed.  BELOW ARE SYMPTOMS THAT SHOULD BE REPORTED IMMEDIATELY: *FEVER GREATER THAN 100.4 F (38 C) OR HIGHER *CHILLS OR SWEATING *NAUSEA AND VOMITING THAT IS NOT CONTROLLED WITH YOUR NAUSEA MEDICATION *UNUSUAL SHORTNESS OF BREATH *UNUSUAL BRUISING OR BLEEDING *URINARY PROBLEMS (pain or burning when urinating, or frequent urination) *BOWEL PROBLEMS (unusual diarrhea, constipation, pain near the anus) TENDERNESS IN MOUTH AND THROAT WITH OR WITHOUT PRESENCE OF ULCERS (sore throat, sores in mouth, or a toothache) UNUSUAL RASH, SWELLING OR PAIN  UNUSUAL VAGINAL DISCHARGE OR ITCHING   Items with * indicate a potential emergency and should be followed up as soon as possible or go to the Emergency Department  if any problems should occur.  Please show the CHEMOTHERAPY ALERT CARD or IMMUNOTHERAPY ALERT CARD at check-in to the Emergency Department and triage nurse.  Should you have questions after your visit or need to cancel or reschedule your appointment, please contact [facility] [place] [facility]  Dept: [phone]  and follow the  prompts.  Office hours are 8:00 a.m. to 4:30 p.m. Monday - Friday. Please note that voicemails left after 4:00 p.m. may not be returned until the following business day.  We are closed weekends and major holidays. You have access to a nurse at all times for urgent questions. Please call the main number to the clinic Dept: [phone] and follow the prompts.  For any non-urgent questions, you may also contact your provider using MyChart. We now offer e-Visits for anyone 78 and older to request care online for non-urgent symptoms. For details visit [email or web address].   Also download the MyChart app! Go to the app store, search "MyChart", open the app, select [facility], and log in with your MyChart username and password.

## 2023-12-30 NOTE — Assessment & Plan Note (Signed)
 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  Once again it is higher than previous.  We will proceed with pembrolizumab  and continue to monitor this.

## 2023-12-30 NOTE — Progress Notes (Signed)
 [facility]  [address] 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  She was treated with left lower lobectomy in [place] .  Her doctor there recommended chemotherapy with immunotherapy, but she refused chemotherapy.  PD-L1 was positive.  She was only willing to take the immunotherapy for 1 year. She started pembrolizumab  in August 2023.  We saw her in October 2023, when she returned to [place]  and continued pembrolizumab  every 21 days. Routine CT imaging had not shown any evidence of recurrence.  She completed 17 cycles of adjuvant pembrolizumab  in August and CT chest following that was negative for recurrence. There were stable scattered small bilateral pulmonary nodules, felt to be from benign emphysema.  Surgical changes were seen in the left lower lobe.   Unfortunately, she had evidence of recurrence on CT chest in December 2024 with interval progression in the size and number of multiple bilateral pulmonary nodules most consistent with progression of metastatic disease. We decided not to pursue a biopsy in view of the difficulty involved. Treatment options were discussed and she wished not to pursue chemotherapy unless absolutely necessary.  She was placed back on pembrolizumab .  CT imaging in June revealed very mild progression with some increased mediastinal and bilateral hilar lymphadenopathy. There were multiple bilateral pulmonary nodules measuring up to 8 mm in size, so just mildly progressive. There was a small left pleural effusion with mild pleural thickening. After discussion with the patient, we opted to continue immunotherapy due to the extremely slow progression of her disease.  She wishes to avoid IV chemotherapy if possible.  She understands  chemotherapy probably will be necessary in the future.   Repeat CT imaging in September revealed stable disease.  She had worsening cough treated with antibiotics and steroids without much improvement.  Her 16th cycle of pembrolizumab  was held last week.  Chest x-ray revealed stable interstitial prominence.  She saw Dr. Jama again and was started on Trelegy with significant improvement in her cough.  She will proceed with a 16th cycle of pembrolizumab  today.  We will plan to see her back in 3 weeks with a CBC, comprehensive metabolic panel, TSH and CT chest to reassess her disease baseline prior to her a 17th cycle of pembrolizumab .  Malignant neoplasm metastatic to lung [facility]) CT chest in December 2024 revealed multiple bilateral pulmonary nodules which increased in size and number since imaging in August 2024.  These measure up to 7 mm in diameter.  There was an area of pleural plaque in the posteriomedial left upper lobe, measuring 2.9 cm.  Prior PD-L1 was positive at 90%, so she resumed pembrolizumab  in January 2025.  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  Once again it is higher than previous.  We will proceed with pembrolizumab  and continue to monitor this.  Hypothyroidism due to medication The TSH has fluctuated up and down.  The TSH was back to 11.6 in October, so we recommended increasing the levothyroxine  to 100 mcg daily.  TSH is pending from today.    The patient understands the plans discussed today and is in agreement with them.  She knows to contact our office if she develops  concerns prior to her next appointment.   I provided 20 minutes of face-to-face time during this encounter and > 50% was spent counseling as documented under my assessment and plan.    Sydney Hunt A Sydney Seifried, PA-C  [facility] A DEPT OF [facility] [address] [place] [address] Dept: [phone] Dept Fax: [phone]   No orders of the defined types were placed in this encounter.     CHIEF COMPLAINT:  CC: Recurrent non-small cell lung cancer  Current Treatment: Pembrolizumab  every 3 weeks  HISTORY OF PRESENT ILLNESS:   Oncology History  Non-small cell lung cancer, left (HCC)  01/15/2021 Initial Diagnosis   Non-small cell lung cancer, left (HCC)   02/12/2021 Cancer Staging   Staging form: Lung, AJCC 8th Edition - Clinical stage from 02/12/2021: Stage IA2 (cT1b, cN0, cM0) - Signed by Sydney Sydney VEAR, MD on 02/14/2021 Histopathologic type: Adenocarcinoma, NOS Stage prefix: Initial diagnosis Histologic grade (G): GX Histologic grading system: 4 grade system Laterality: Left Tumor size (mm): 19 Lymph-vascular invasion (LVI): LVI not present (absent)/not identified Diagnostic confirmation: Positive histology Specimen type: Bronchial Biopsy Staged by: Managing physician Stage used in treatment planning: Yes National guidelines used in treatment planning: Yes Type of national guideline used in treatment planning: NCCN Staging comments: Rec surgical resection   05/11/2021 Cancer Staging   Staging form: Lung, AJCC 8th Edition - Pathologic stage from 05/11/2021: Stage IIB (ypT1c, pN1, cM0) - Signed by Sydney Sydney VEAR, MD on 11/12/2021 Histopathologic type: Adenocarcinoma, NOS Stage prefix: Post-therapy Histologic grade (G): G2 Histologic grading system: 4 grade system Residual tumor (R): R0 - None Laterality: Left Tumor size (mm): 25 Lymph-vascular invasion (LVI): LVI not present (absent)/not identified Diagnostic confirmation: Positive histology PLUS positive immunophenotyping and/or positive genetic studies Specimen type: Excision Staged by: Managing physician Type of lung cancer: Surgically resected non-small cell lung cancer ECOG performance status: Grade 1 Perineural invasion (PNI):  Absent Pleural/elastic layer invasion: PL0 Pleural lavage cytology: Unknown Weight loss: Absent Adequacy of mediastinal dissection: Adequate Radiotherapy dose: No Adjuvant radiation: No Adjuvant chemotherapy: Yes Stage used in treatment planning: Yes National guidelines used in treatment planning: Yes Type of national guideline used in treatment planning: NCCN Staging comments: Adjuvant pembrolizumab , PDL 1 90%   10/04/2021 - 10/01/2022 Chemotherapy   Patient is on Treatment Plan : LUNG NSCLC Pembrolizumab  Adj q21d x 18 cycles     02/04/2023 -  Chemotherapy   Patient is on Treatment Plan : LUNG NSCLC Pembrolizumab  (200) q21d         INTERVAL HISTORY:   Sydney Hunt is here today for repeat clinical assessment prior to a 16th cycle of pembrolizumab .  She has had a chronic cough which worsened.  She was treated with antibiotics and steroids 2 weeks ago without significant improvement.  Pembrolizumab  was held last week.  Chest x-ray revealed stable interstitial changes.  She saw Dr. Jama again and he started her on Trelegy.  She states her cough has nearly resolved.  She denies significant shortness of breath.  She denies fevers or chills. She denies pain. Her appetite is still somewhat decreased. Her weight has been fairly stable between 91 and 93 pounds.  She is scheduled for repeat CT chest prior to her next cycle of treatment.  REVIEW OF SYSTEMS:   Review of Systems  Constitutional:  Positive for appetite change (decreased). Negative for chills, fatigue, fever and unexpected weight change.  HENT:   Negative for lump/mass,  mouth sores and sore throat.   Respiratory:  Positive for cough (improved). Negative for shortness of breath.   Cardiovascular:  Negative for chest pain and leg swelling.  Gastrointestinal:  Negative for abdominal pain, constipation, diarrhea, nausea and vomiting.  Genitourinary:  Negative for difficulty urinating, dysuria, frequency and hematuria.   Musculoskeletal:  Negative  for arthralgias, back pain, gait problem and myalgias.  Skin:  Negative for rash.  Neurological:  Negative for dizziness, extremity weakness, gait problem, headaches, light-headedness and numbness.  Hematological:  Negative for adenopathy. Does not bruise/bleed easily.  Psychiatric/Behavioral:  Negative for depression and sleep disturbance. The patient is not nervous/anxious.      VITALS:   Blood pressure 90/69, pulse 73, temperature 98.5 F (36.9 C), temperature source Oral, resp. rate 18, height 4' 11 (1.499 m), weight 91 lb 8 oz (41.5 kg), SpO2 98%.  Wt Readings from Last 3 Encounters:  12/30/23 91 lb 8 oz (41.5 kg)  12/18/23 92 lb 11.2 oz (42 kg)  12/01/23 91 lb 4 oz (41.4 kg)    Body mass index is 18.48 kg/m.  Performance status (ECOG): 1 - Symptomatic but completely ambulatory    PHYSICAL EXAM:   Physical Exam Vitals and nursing note reviewed.  Constitutional:      General: She is not in acute distress.    Appearance: Normal appearance.  HENT:     Head: Normocephalic and atraumatic.     Mouth/Throat:     Mouth: Mucous membranes are moist.     Pharynx: Oropharynx is clear. No oropharyngeal exudate or posterior oropharyngeal erythema.  Eyes:     General: No scleral icterus.    Extraocular Movements: Extraocular movements intact.     Conjunctiva/sclera: Conjunctivae normal.     Pupils: Pupils are equal, round, and reactive to light.  Cardiovascular:     Rate and Rhythm: Normal rate and regular rhythm.     Heart sounds: Normal heart sounds. No murmur heard.    No friction rub. No gallop.  Pulmonary:     Effort: Pulmonary effort is normal.     Breath sounds: Normal breath sounds. No wheezing, rhonchi or rales.  Abdominal:     General: There is no distension.     Palpations: Abdomen is soft. There is no hepatomegaly, splenomegaly or mass.     Tenderness: There is no abdominal tenderness.  Musculoskeletal:        General: Normal range of motion.     Cervical back:  Normal range of motion and neck supple. No tenderness.     Right lower leg: No edema.     Left lower leg: No edema.  Lymphadenopathy:     Cervical: No cervical adenopathy.     Upper Body:     Right upper body: No supraclavicular or axillary adenopathy.     Left upper body: No supraclavicular or axillary adenopathy.     Lower Body: No right inguinal adenopathy. No left inguinal adenopathy.  Skin:    General: Skin is warm and dry.     Coloration: Skin is not jaundiced.     Findings: No rash.  Neurological:     Mental Status: She is alert and oriented to person, place, and time.     Cranial Nerves: No cranial nerve deficit.  Psychiatric:        Mood and Affect: Mood normal.        Behavior: Behavior normal.        Thought Content: Thought content normal.  LABS:      Latest Ref Rng & Units 12/30/2023    8:26 AM 12/18/2023   10:49 AM 11/28/2023   10:10 AM  CBC  WBC 4.0 - 10.5 K/uL 13.2  12.2  10.3   Hemoglobin 12.0 - 15.0 g/dL 86.3  85.8  85.1   Hematocrit 36.0 - 46.0 % 41.3  42.3  43.4   Platelets 150 - 400 K/uL 335  307  301       Latest Ref Rng & Units 12/30/2023    8:26 AM 12/18/2023   10:49 AM 11/28/2023   10:10 AM  CMP  Glucose 70 - 99 mg/dL 92  876  896   BUN 8 - 23 mg/dL 17  25  13    Creatinine 0.44 - 1.00 mg/dL 9.22  9.29  9.25   Sodium 135 - 145 mmol/L 140  138  141   Potassium 3.5 - 5.1 mmol/L 4.3  3.9  3.6   Chloride 98 - 111 mmol/L 105  104  102   CO2 22 - 32 mmol/L 27  25  32   Calcium 8.9 - 10.3 mg/dL 9.5  9.8  9.8   Total Protein 6.5 - 8.1 g/dL 6.6  7.5  7.6   Total Bilirubin 0.0 - 1.2 mg/dL 2.0  1.2  1.4   Alkaline Phos 38 - 126 U/L 54  54  83   AST 15 - 41 U/L 16  16  15    ALT 0 - 44 U/L 11  11  10       Lab Results  Component Value Date   CEA1 5.3 (H) 10/17/2022   CEA 4.2 01/13/2023   /  CEA  Date Value Ref Range Status  01/13/2023 4.2  Final  10/17/2022 5.3 (H) 0.0 - 4.7 ng/mL Final    Comment:    (NOTE)                              Nonsmokers          <3.9                             Smokers             <5.6 [employer] Electrochemiluminescence Immunoassay (ECLIA) Values obtained with different assay methods or kits cannot be used interchangeably.  Results cannot be interpreted as absolute evidence of the presence or absence of malignant disease. Performed At: [facility] [address] [place], [place] [id number] Jennette Shorter MD Ey:[phone]     Lab Results  Component Value Date   LDH 130 11/28/2022    STUDIES:   DG Chest 2 View Result Date: 12/23/2023 EXAM: 2 VIEW(S) XRAY OF THE CHEST 12/18/2023 11:58:06 AM COMPARISON: 06/26/2023, 10/16/2023 CT CLINICAL HISTORY: cough FINDINGS: LINES, TUBES AND DEVICES: Stable left subclavian port catheter. LUNGS AND PLEURA: Hyperinflated lungs. Diffuse interstitial prominence unchanged. Small left pleural effusion suggested by blunting of left lateral costophrenic angle. No focal pulmonary opacity. No pneumothorax. HEART AND MEDIASTINUM: Aortic atherosclerosis. Chronic blunting of the left costophrenic angle is noted. BONES AND SOFT TISSUES: No acute osseous abnormality. IMPRESSION: 1. Hyperinflated lungs with diffuse interstitial prominence, unchanged. 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 Report date is 12/12/2020.     The Multi-Cancer + RNA Panel offered by  [employer] includes sequencing and/or deletion/duplication analysis of the following 84 genes:  AIP*, ALK, APC*, ATM*, AXIN2*, BAP1*, BARD1*, BLM*, BMPR1A*, BRCA1*, BRCA2*, BRIP1*, CASR, CDC73*, CDH1*, CDK4, CDKN1B*, CDKN1C*, CDKN2A, CEBPA, CHEK2*, CTNNA1*, DICER1*, DIS3L2*, EGFR, EPCAM, FH   GERD (gastroesophageal reflux disease)    Gilbert's disease 07/22/2023   Counseled on dx 6/24     Heartburn 12/27/2021   Hormone disorder 11/20/2020   Hyperbilirubinemia 09/26/2022   Hypertension    Hypokalemia 12/27/2021   Hypothyroidism due to medication 01/22/2022   Leukocytosis 01/17/2022   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) 02/11/2023   Multiple bilateral pulmonary nodules up to 7 mm in diameter, increased in size and number from prior study of August 2024     Migraines    Nodule of left lung 11/21/2020   Non-small cell lung cancer, left (HCC) 01/15/2021   Port-A-Cath in place 09/05/2022   Tobacco use 12/07/2020    Past Surgical History:  Procedure Laterality Date   ABDOMINAL HYSTERECTOMY  1989   unsure of vaginal or abdominal   BRONCHIAL BIOPSY  01/15/2021   Procedure: BRONCHIAL BIOPSIES;  Surgeon: Shelah Lamar RAMAN, MD;  Location: [facility] ENDOSCOPY;  Service: Pulmonary;;   BRONCHIAL BRUSHINGS  01/15/2021   Procedure: BRONCHIAL BRUSHINGS;  Surgeon: Shelah Lamar RAMAN, MD;  Location: [facility] ENDOSCOPY;  Service: Pulmonary;;   BRONCHIAL NEEDLE ASPIRATION BIOPSY  01/15/2021   Procedure: BRONCHIAL NEEDLE ASPIRATION BIOPSIES;  Surgeon: Shelah Lamar RAMAN, MD;  Location: [facility] ENDOSCOPY;  Service: Pulmonary;;   COLONOSCOPY  2022   FIDUCIAL MARKER PLACEMENT  01/15/2021   Procedure: FIDUCIAL MARKER PLACEMENT;  Surgeon: Shelah Lamar RAMAN, MD;  Location: [facility] ENDOSCOPY;  Service: Pulmonary;;   ovaries removed  1986   surgery to remove scar tissue     from colon/bladder in patient's late 30's   TUBAL LIGATION  1986   UPPER GI ENDOSCOPY     years ago in patient's late 77s   VIDEO BRONCHOSCOPY WITH RADIAL  ENDOBRONCHIAL ULTRASOUND  01/15/2021   Procedure: VIDEO BRONCHOSCOPY WITH RADIAL ENDOBRONCHIAL ULTRASOUND;  Surgeon: Shelah Lamar RAMAN, MD;  Location: [facility] ENDOSCOPY;  Service: Pulmonary;;    Family History  Problem Relation Age of Onset   Pancreatic cancer Maternal Aunt 50   Brain cancer Maternal Uncle    Stomach cancer Maternal Uncle        dx. >50   Prostate cancer Half-Brother        passed away at 78   Prostate cancer Half-Brother     Social History:  reports that she quit smoking about 8 weeks ago. Her smoking use included cigarettes. She started smoking about 62 years ago. She has a 30.3 pack-year smoking history. She has never used smokeless tobacco. She reports current alcohol use of about 4.0 standard drinks of alcohol per week. She reports current drug use. Frequency: 3.00 times per week. Drug: Marijuana.The patient is alone today.  Allergies:  Allergies  Allergen Reactions   Codeine Rash  Demerol [Meperidine Hcl] Other (See Comments)    coma   Meperidine Other (See Comments)    Several days will pass without patient being aware  Several days will pass without patient being aware    coma   Other Dermatitis    Stainless steel / skin weeps   Silver Dermatitis    Sterling silver skin weeps   Gold Bond [Menthol-Zinc Oxide] Dermatitis    Skin weeps   Neosporin [Bacitracin-Polymyxin B] Dermatitis    Skin weeps   Wound Dressing Adhesive Other (See Comments)    CAUSES SKIN TEARS PER PATIENT. INSTEAD USE THE TEGADERM 70M WITH ADHESIVE FREE WINDOW OVER PORT.   Wound Dressings Other (See Comments)    CAUSES SKIN TEARS PER PATIENT. INSTEAD USE THE TEGADERM 70M WITH ADHESIVE FREE WINDOW OVER PORT.    Current Medications: Current Outpatient Medications  Medication Sig Dispense Refill   amLODipine (NORVASC) 2.5 MG tablet Take 2.5 mg by mouth daily.     aspirin 81 MG EC tablet Take 81 mg by mouth daily.     atorvastatin (LIPITOR) 40 MG tablet Take 40 mg by mouth daily.      benzonatate (TESSALON) 200 MG capsule Take 200 mg by mouth 3 (three) times daily as needed.     celecoxib (CELEBREX) 200 MG capsule Take 200 mg by mouth daily.     HYDROcodone -acetaminophen  (NORCO/VICODIN) 5-325 MG tablet Take 1 tablet by mouth every 6 (six) hours as needed for moderate pain (pain score 4-6) or severe pain (pain score 7-10). 60 tablet 0   levothyroxine  (SYNTHROID ) 100 MCG tablet Take 1 tablet (100 mcg total) by mouth daily before breakfast. 30 tablet 5   lidocaine -prilocaine  (EMLA ) cream Apply 1 Application topically as needed. 30 g 0   LORazepam  (ATIVAN ) 1 MG tablet Take 1 tablet (1 mg total) by mouth every 8 (eight) hours. Use 1-2 tablets by mouth prior to MRI scan 30 tablet 0   ondansetron  (ZOFRAN ) 4 MG tablet Take 1 tablet (4 mg total) by mouth every 4 (four) hours as needed for nausea. 90 tablet 3   pantoprazole  (PROTONIX ) 40 MG tablet TAKE 1 TABLET(40 MG) BY MOUTH DAILY 180 tablet 0   Prenatal Vit-Fe Fumarate-FA (PRENATAL VITAMIN) 27-0.8 MG TABS Take 1 tablet by mouth daily. 30 tablet 5   prochlorperazine  (COMPAZINE ) 10 MG tablet TAKE 1 TABLET(10 MG) BY MOUTH EVERY 6 HOURS AS NEEDED FOR NAUSEA OR VOMITING 360 tablet 0   temazepam (RESTORIL) 15 MG capsule Take 15 mg by mouth at bedtime as needed.     TRELEGY ELLIPTA 200-62.5-25 MCG/ACT AEPB Take 1 puff by mouth daily.     VENTOLIN  HFA 108 (90 Base) MCG/ACT inhaler Inhale 1-2 puffs into the lungs as needed for wheezing or shortness of breath.     No current facility-administered medications for this visit.   Facility-Administered Medications Ordered in Other Visits  Medication Dose Route Frequency Provider Last Rate Last Admin   0.9 %  sodium chloride  infusion   Intravenous Continuous Sydney Sydney DEL, MD 10 mL/hr at 12/30/23 0933 New Bag at 12/30/23 0933   pembrolizumab  (KEYTRUDA ) 200 mg in sodium chloride  0.9 % 50 mL chemo infusion  200 mg Intravenous Once Sydney Sydney DEL, MD       sodium chloride  flush (NS) 0.9 %  injection 10 mL  10 mL Intracatheter PRN Sydney Sydney DEL, MD   10 mL at 08/21/23 1042

## 2024-01-08 ENCOUNTER — Inpatient Hospital Stay

## 2024-01-08 ENCOUNTER — Inpatient Hospital Stay: Admitting: Oncology

## 2024-01-12 ENCOUNTER — Inpatient Hospital Stay

## 2024-01-15 ENCOUNTER — Inpatient Hospital Stay (HOSPITAL_BASED_OUTPATIENT_CLINIC_OR_DEPARTMENT_OTHER)
Admission: RE | Admit: 2024-01-15 | Discharge: 2024-01-15 | Disposition: A | Source: Ambulatory Visit | Attending: Oncology | Admitting: Oncology

## 2024-01-15 ENCOUNTER — Inpatient Hospital Stay

## 2024-01-15 DIAGNOSIS — C7801 Secondary malignant neoplasm of right lung: Secondary | ICD-10-CM

## 2024-01-15 DIAGNOSIS — C7802 Secondary malignant neoplasm of left lung: Secondary | ICD-10-CM

## 2024-01-15 MED ORDER — IOHEXOL 300 MG/ML  SOLN
100.0000 mL | Freq: Once | INTRAMUSCULAR | Status: AC | PRN
  Administered 2024-01-15: 09:00:00 60 mL via INTRAVENOUS

## 2024-01-15 MED ORDER — HEPARIN SOD (PORK) LOCK FLUSH 100 UNIT/ML IV SOLN
500.0000 [IU] | Freq: Once | INTRAVENOUS | Status: AC
  Administered 2024-01-15: 09:00:00 500 [IU] via INTRAVENOUS

## 2024-01-20 ENCOUNTER — Other Ambulatory Visit: Payer: Self-pay | Admitting: Oncology

## 2024-01-20 ENCOUNTER — Inpatient Hospital Stay: Admitting: Oncology

## 2024-01-20 ENCOUNTER — Inpatient Hospital Stay

## 2024-01-20 ENCOUNTER — Telehealth: Payer: Self-pay | Admitting: Oncology

## 2024-01-20 ENCOUNTER — Other Ambulatory Visit (HOSPITAL_BASED_OUTPATIENT_CLINIC_OR_DEPARTMENT_OTHER): Admitting: Radiology

## 2024-01-20 VITALS — BP 116/87 | HR 71 | Temp 98.2°F | Resp 18 | Ht 59.0 in | Wt 94.8 lb

## 2024-01-20 DIAGNOSIS — C3492 Malignant neoplasm of unspecified part of left bronchus or lung: Secondary | ICD-10-CM | POA: Diagnosis not present

## 2024-01-20 DIAGNOSIS — Z5112 Encounter for antineoplastic immunotherapy: Secondary | ICD-10-CM | POA: Diagnosis not present

## 2024-01-20 LAB — CMP (CANCER CENTER ONLY)
ALT: 16 U/L (ref 0–44)
AST: 18 U/L (ref 15–41)
Albumin: 4.4 g/dL (ref 3.5–5.0)
Alkaline Phosphatase: 59 U/L (ref 38–126)
Anion gap: 9 (ref 5–15)
BUN: 21 mg/dL (ref 8–23)
CO2: 29 mmol/L (ref 22–32)
Calcium: 10.6 mg/dL — ABNORMAL HIGH (ref 8.9–10.3)
Chloride: 105 mmol/L (ref 98–111)
Creatinine: 0.79 mg/dL (ref 0.44–1.00)
GFR, Estimated: >60 mL/min
Glucose, Bld: 82 mg/dL (ref 70–99)
Potassium: 4.9 mmol/L (ref 3.5–5.1)
Sodium: 142 mmol/L (ref 135–145)
Total Bilirubin: 1.2 mg/dL (ref 0.0–1.2)
Total Protein: 6.7 g/dL (ref 6.5–8.1)

## 2024-01-20 LAB — CBC WITH DIFFERENTIAL (CANCER CENTER ONLY)
Abs Immature Granulocytes: 0.03 K/uL (ref 0.00–0.07)
Basophils Absolute: 0.1 K/uL (ref 0.0–0.1)
Basophils Relative: 1 %
Eosinophils Absolute: 0.5 K/uL (ref 0.0–0.5)
Eosinophils Relative: 5 %
HCT: 39.9 % (ref 36.0–46.0)
Hemoglobin: 13.3 g/dL (ref 12.0–15.0)
Immature Granulocytes: 0 %
Lymphocytes Relative: 35 %
Lymphs Abs: 3.6 K/uL (ref 0.7–4.0)
MCH: 32.8 pg (ref 26.0–34.0)
MCHC: 33.3 g/dL (ref 30.0–36.0)
MCV: 98.3 fL (ref 80.0–100.0)
Monocytes Absolute: 0.9 K/uL (ref 0.1–1.0)
Monocytes Relative: 9 %
Neutro Abs: 5.2 K/uL (ref 1.7–7.7)
Neutrophils Relative %: 50 %
Platelet Count: 277 K/uL (ref 150–400)
RBC: 4.06 MIL/uL (ref 3.87–5.11)
RDW: 13.9 % (ref 11.5–15.5)
WBC Count: 10.3 K/uL (ref 4.0–10.5)
nRBC: 0 % (ref 0.0–0.2)

## 2024-01-20 NOTE — Telephone Encounter (Signed)
 Patient has been scheduled for follow-up visit per 01/20/2024 LOS.  Pt given an appt calendar with date and time.

## 2024-01-20 NOTE — Progress Notes (Signed)
 " [facility]  [address] [place],  [place]  [address] [phone]  Clinic Day: 01/20/24  Referring physician: Jama Chow, MD  ASSESSMENT & PLAN:  Assessment: Non-small cell lung cancer, left (HCC) Stage IIB adenocarcinoma of the left lung diagnosed in December 2022.  She was treated with left lower lobectomy in [place] .  Her doctor there recommended chemotherapy with immunotherapy, but she refused chemotherapy.  PD-L1 was positive.  She was only willing to take the immunotherapy for 1 year. She started pembrolizumab  in August 2023.  We saw her in October 2023, when she returned to [place]  and continued pembrolizumab  every 21 days. Routine CT imaging had not shown any evidence of recurrence.  She completed 17 cycles of adjuvant pembrolizumab  in August and CT chest following that was negative for recurrence. There were stable scattered small bilateral pulmonary nodules, felt to be from benign emphysema.  Surgical changes were seen in the left lower lobe.    Unfortunately, she had evidence of recurrence on CT chest in December 2024 with interval progression in the size and number of multiple bilateral pulmonary nodules most consistent with progression of metastatic disease. We decided not to pursue a biopsy in view of the difficulty involved. She was placed back on pembrolizumab . CT imaging in June revealed very mild progression with some increased mediastinal and bilateral hilar lymphadenopathy. There were multiple bilateral pulmonary nodules measuring up to 8 mm in size, so just mildly progressive. There was a small left pleural effusion with mild pleural thickening. After discussion with the patient, we opted to continue immunotherapy due to the extremely slow progression of her disease.  She wishes to avoid IV chemotherapy if possible.  She understands chemotherapy probably will be necessary in the future. Repeat CT imaging in September 2025 revealed stable disease.  She had  worsening cough treated with antibiotics and steroids without much improvement.  Her 16th cycle of pembrolizumab  was held last week.  Chest x-ray revealed stable interstitial prominence.  She saw Dr. Jama again and was started on Trelegy with significant improvement in her cough.     CT chest done on 01/15/2024 which revealed cluster nodularity posteriorly in the superior segment right lower lobe that is favored inflammatory etiology, a new left upper lobe nodule measuring 0.8 cm with slightly hazy margins, reduced size of a 6 mm right lower lobe nodule that was previously 8 mm, and a mildly reduced size of left lower lobe nodules including a 5 mm left upper lobe nodule that was previously 6mm. Other small scattered pulmonary nodules appear stable to mildly reduced in general. Reduced interstitial accentuation compared to her prior study, chronic emphysema and right apical pleuroparenchymal scarring, left lower lobectomy has chronic postoperative change. Her incidental small right hilar and subcarinal lymph nodes has decreased in size compared with prior imaging, with the right hilar node measuring 1.1cm short axis and AP window node 0.5 cm short axis. Several additional chronic and incidental findings are present, including scarring, degenerative spinal changes, and benign-appearing small pulmonary nodules are also noted. So she is clearly responding to immunotherapy.  She will proceed with a 17th cycle of pembrolizumab  on 12/29.     Malignant neoplasm metastatic to lung [facility]) CT chest in December 2024 revealed multiple bilateral pulmonary nodules which increased in size and number since imaging in August 2024.  These measure up to 7 mm in diameter.  There was an area of pleural plaque in the posteriomedial left upper lobe, measuring 2.9 cm.  Prior PD-L1 was positive at 90%, so she resumed pembrolizumab  in January 2025.   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  Her  bilirubin has fluctuated up and down due to Gilbert's syndrome.  Once again it is higher than previous.  We will proceed with pembrolizumab  and continue to monitor this.   Hypothyroidism due to medication The TSH has fluctuated up and down.  The TSH was back to 11.6 in October, so we recommended increasing the levothyroxine  to 100 mcg daily.  TSH is pending from today.  Plan: She had a CT chest done on 01/15/2024 which revealed cluster nodularity posteriorly in the superior segment right lower lobe that is favored inflammatory etiology, a new left upper lobe nodule measuring 0.8 cm with slightly hazy margins, reduced size of a 6 mm right lower lobe nodule that was previously 8 mm, and a mildly reduced size of left lower lobe nodules including a 5 mm left upper lobe nodule that was previously 6mm. Other small scattered pulmonary nodules appear stable to mildly reduced in general and heterogeneous arterial phase enhancement in the upper liver, including a 1.0cm focus of arterial phase nodularity and other small geographic and nodular regions of arterial phase enhancement, which may reflect early arterial phase contrast/vascular shunting versus arterial enhancing primary or metastatic hepatic lesions is also noted. Reduced interstitial accentuation compared to her prior study, chronic emphysema and right apical pleuroparenchymal scarring, left lower lobectomy has chronic postoperative change, so this is overall improved. Her incidental small right hilar and subcarinal lymph nodes have decreased in size compared with prior imaging, with the right hilar node measuring 1.1cm short axis and AP window node 0.5 cm short axis. Several additional chronic and incidental findings are present, including scarring, degenerative spinal changes, and benign-appearing small pulmonary nodules are also noted. Patient informed me that she has not had a colonoscopy in about 5 years and her last one polyps were found. I agreed that  she can have this done and she prefers to do so through Cologuard testing, which is appropriate given her diagnosis of metastatic lung cancer. Her day 1 cycle 17 of Pembrolizumab  is scheduled on 01/26/2024. She has a WBC of 10.3, hemoglobin of 13.3, and platelet count of 277,000. Her CMP is normal other than a mildly elevated calcium of 10.6. I will see her back in 3 weeks with CBC and CMP.  The patient understands the plans discussed today and is in agreement with them.  She knows to contact our office if she develops concerns prior to her next appointment.   I provided 10 minutes of face-to-face time during this encounter and > 50% was spent counseling as documented under my assessment and plan.   [name], MD  [facility] OF [facility] [address] [place] [address] Dept: [phone] Dept Fax: [phone]   No orders of the defined types were placed in this encounter.   CHIEF COMPLAINT:  CC: Recurrent non-small cell lung cancer  Current Treatment: Pembrolizumab  every 3 weeks  HISTORY OF PRESENT ILLNESS:   Oncology History  Non-small cell lung cancer, left (HCC)  01/15/2021 Initial Diagnosis   Non-small cell lung cancer, left (HCC)   02/12/2021 Cancer Staging   Staging form: Lung, AJCC 8th Edition - Clinical stage from 02/12/2021: Stage IA2 (cT1b, cN0, cM0) - Signed by Cornish Wanda VEAR, MD on 02/14/2021 Histopathologic type: Adenocarcinoma, NOS Stage  prefix: Initial diagnosis Histologic grade (G): GX Histologic grading system: 4 grade system Laterality: Left Tumor size (mm): 19 Lymph-vascular invasion (LVI): LVI not present (absent)/not identified Diagnostic confirmation: Positive histology Specimen type: Bronchial Biopsy Staged by: Managing physician Stage used in treatment planning: Yes National guidelines used in treatment planning: Yes Type of national guideline used in treatment  planning: NCCN Staging comments: Rec surgical resection   05/11/2021 Cancer Staging   Staging form: Lung, AJCC 8th Edition - Pathologic stage from 05/11/2021: Stage IIB (ypT1c, pN1, cM0) - Signed by Cornelius Wanda DEL, MD on 11/12/2021 Histopathologic type: Adenocarcinoma, NOS Stage prefix: Post-therapy Histologic grade (G): G2 Histologic grading system: 4 grade system Residual tumor (R): R0 - None Laterality: Left Tumor size (mm): 25 Lymph-vascular invasion (LVI): LVI not present (absent)/not identified Diagnostic confirmation: Positive histology PLUS positive immunophenotyping and/or positive genetic studies Specimen type: Excision Staged by: Managing physician Type of lung cancer: Surgically resected non-small cell lung cancer ECOG performance status: Grade 1 Perineural invasion (PNI): Absent Pleural/elastic layer invasion: PL0 Pleural lavage cytology: Unknown Weight loss: Absent Adequacy of mediastinal dissection: Adequate Radiotherapy dose: No Adjuvant radiation: No Adjuvant chemotherapy: Yes Stage used in treatment planning: Yes National guidelines used in treatment planning: Yes Type of national guideline used in treatment planning: NCCN Staging comments: Adjuvant pembrolizumab , PDL 1 90%   10/04/2021 - 10/01/2022 Chemotherapy   Patient is on Treatment Plan : LUNG NSCLC Pembrolizumab  Adj q21d x 18 cycles     02/04/2023 -  Chemotherapy   Patient is on Treatment Plan : LUNG NSCLC Pembrolizumab  (200) q21d       INTERVAL HISTORY:  Sydney Hunt is here today for repeat clinical assessment for her recurrent non-small cell lung cancer. Patient states that she feels well but complains of chronic arthralgias. She had a CT chest done on 01/15/2024 which revealed cluster nodularity posteriorly in the superior segment right lower lobe that is favored inflammatory etiology, a new left upper lobe nodule measuring 0.8 cm with slightly hazy margins, reduced size of a 6 mm right lower lobe nodule  that was previously 8 mm, and a mildly reduced size of left lower lobe nodules including a 5 mm left upper lobe nodule that was previously 6mm. Other small scattered pulmonary nodules appear stable to mildly reduced in general and heterogeneous arterial phase enhancement in the upper liver, including a 1.0cm focus of arterial phase nodularity and other small geographic and nodular regions of arterial phase enhancement, which may reflect early arterial phase contrast/vascular shunting versus arterial enhancing primary or metastatic hepatic lesions is also noted. Reduced interstitial accentuation compared to her prior study, chronic emphysema and right apical pleuroparenchymal scarring, left lower lobectomy has chronic postoperative change, so overall improved Her incidental small right hilar and subcarinal lymph nodes have decreased in size compared with prior imaging, with the right hilar node measuring 1.1cm short axis and AP window node 0.5 cm short axis. Several additional chronic and incidental findings are present, including scarring, degenerative spinal changes, and benign-appearing small pulmonary nodules are also noted. Patient informed me that she has not had a colonoscopy in about 5 years and her last one polyps were found. I agreed that she can have this done and she prefers to do so through Cologuard testing, which is appropriate given her diagnosis of metastatic lung cancer. Her day 1 cycle 17 of Pembrolizumab  is scheduled on 01/26/2024. She has a WBC of 10.3, hemoglobin of 13.3, and platelet count of 277,000. Her CMP is normal other than a  mildly elevated calcium of 10.6. I will see her back in 3 weeks with CBC and CMP.   She denies fever, chills, night sweats, or other signs of infection. She denies cardiorespiratory and gastrointestinal issues. She  denies pain. Her appetite is good and Her weight has increased 3 pounds over last 3 weeks.   REVIEW OF SYSTEMS:   Review of Systems   Constitutional: Negative.  Negative for appetite change, chills, fatigue, fever and unexpected weight change.  HENT:  Negative.  Negative for lump/mass, mouth sores and sore throat.   Eyes: Negative.   Respiratory:  Positive for cough (improved). Negative for chest tightness, hemoptysis, shortness of breath and wheezing.   Cardiovascular: Negative.  Negative for chest pain, leg swelling and palpitations.  Gastrointestinal: Negative.  Negative for abdominal distention, abdominal pain, blood in stool, constipation, diarrhea, nausea and vomiting.  Endocrine: Negative.   Genitourinary: Negative.  Negative for difficulty urinating, dysuria, frequency and hematuria.   Musculoskeletal:  Positive for arthralgias. Negative for back pain, flank pain, gait problem and myalgias.  Skin: Negative.  Negative for rash.  Neurological:  Negative for dizziness, extremity weakness, gait problem, headaches, light-headedness, numbness, seizures and speech difficulty.  Hematological: Negative.  Negative for adenopathy. Does not bruise/bleed easily.  Psychiatric/Behavioral: Negative.  Negative for depression and sleep disturbance. The patient is not nervous/anxious.     VITALS:   Blood pressure 116/87, pulse 71, temperature 98.2 F (36.8 C), temperature source Oral, resp. rate 18, height 4' 11 (1.499 m), weight 94 lb 12.8 oz (43 kg), SpO2 100%.  Wt Readings from Last 3 Encounters:  01/26/24 95 lb 0.6 oz (43.1 kg)  01/20/24 94 lb 12.8 oz (43 kg)  12/30/23 91 lb 8 oz (41.5 kg)    Body mass index is 19.15 kg/m.  Performance status (ECOG): 1 - Symptomatic but completely ambulatory  PHYSICAL EXAM:   Physical Exam Vitals and nursing note reviewed.  Constitutional:      General: She is not in acute distress.    Appearance: Normal appearance. She is normal weight.  HENT:     Head: Normocephalic and atraumatic.     Right Ear: Tympanic membrane, ear canal and external ear normal. There is no impacted cerumen.      Left Ear: Tympanic membrane, ear canal and external ear normal. There is no impacted cerumen.     Nose: Nose normal. No congestion or rhinorrhea.     Mouth/Throat:     Mouth: Mucous membranes are moist.     Pharynx: Oropharynx is clear. No oropharyngeal exudate or posterior oropharyngeal erythema.  Eyes:     General: No scleral icterus.    Extraocular Movements: Extraocular movements intact.     Conjunctiva/sclera: Conjunctivae normal.     Pupils: Pupils are equal, round, and reactive to light.  Cardiovascular:     Rate and Rhythm: Normal rate and regular rhythm.     Pulses: Normal pulses.     Heart sounds: Normal heart sounds. No murmur heard.    No friction rub. No gallop.  Pulmonary:     Effort: Pulmonary effort is normal. No respiratory distress.     Breath sounds: Normal breath sounds. No wheezing, rhonchi or rales.  Abdominal:     General: Bowel sounds are normal. There is no distension.     Palpations: Abdomen is soft. There is no hepatomegaly, splenomegaly or mass.     Tenderness: There is no abdominal tenderness.  Musculoskeletal:        General: Normal  range of motion.     Cervical back: Normal range of motion and neck supple. No tenderness.     Right lower leg: No edema.     Left lower leg: No edema.  Lymphadenopathy:     Cervical: No cervical adenopathy.     Right cervical: No superficial, deep or posterior cervical adenopathy.    Left cervical: No superficial, deep or posterior cervical adenopathy.     Upper Body:     Right upper body: No supraclavicular, axillary or pectoral adenopathy.     Left upper body: No supraclavicular, axillary or pectoral adenopathy.     Lower Body: No right inguinal adenopathy. No left inguinal adenopathy.  Skin:    General: Skin is warm and dry.     Coloration: Skin is not jaundiced.     Findings: No rash.  Neurological:     General: No focal deficit present.     Mental Status: She is alert and oriented to person, place, and  time. Mental status is at baseline.     Cranial Nerves: No cranial nerve deficit.  Psychiatric:        Mood and Affect: Mood normal.        Behavior: Behavior normal.        Thought Content: Thought content normal.        Judgment: Judgment normal.    LABS:      Latest Ref Rng & Units 01/20/2024    9:41 AM 12/30/2023    8:26 AM 12/18/2023   10:49 AM  CBC  WBC 4.0 - 10.5 K/uL 10.3  13.2  12.2   Hemoglobin 12.0 - 15.0 g/dL 86.6  86.3  85.8   Hematocrit 36.0 - 46.0 % 39.9  41.3  42.3   Platelets 150 - 400 K/uL 277  335  307       Latest Ref Rng & Units 01/20/2024    9:41 AM 12/30/2023    8:26 AM 12/18/2023   10:49 AM  CMP  Glucose 70 - 99 mg/dL 82  92  876   BUN 8 - 23 mg/dL 21  17  25    Creatinine 0.44 - 1.00 mg/dL 9.20  9.22  9.29   Sodium 135 - 145 mmol/L 142  140  138   Potassium 3.5 - 5.1 mmol/L 4.9  4.3  3.9   Chloride 98 - 111 mmol/L 105  105  104   CO2 22 - 32 mmol/L 29  27  25    Calcium 8.9 - 10.3 mg/dL 89.3  9.5  9.8   Total Protein 6.5 - 8.1 g/dL 6.7  6.6  7.5   Total Bilirubin 0.0 - 1.2 mg/dL 1.2  2.0  1.2   Alkaline Phos 38 - 126 U/L 59  54  54   AST 15 - 41 U/L 18  16  16    ALT 0 - 44 U/L 16  11  11     Lab Results  Component Value Date   CEA1 5.3 (H) 10/17/2022   CEA 4.2 01/13/2023   /  CEA  Date Value Ref Range Status  01/13/2023 4.2  Final  10/17/2022 5.3 (H) 0.0 - 4.7 ng/mL Final    Comment:    (NOTE)                             Nonsmokers          <3.9  Smokers             <5.6 [employer] Electrochemiluminescence Immunoassay (ECLIA) Values obtained with different assay methods or kits cannot be used interchangeably.  Results cannot be interpreted as absolute evidence of the presence or absence of malignant disease. Performed At: [facility] [address] [place], [place] [id number] Jennette Shorter MD Ey:[phone]    Lab Results  Component Value Date   LDH 130 11/28/2022   STUDIES:   CT  Chest W Contrast Result Date: 01/20/2024 EXAM: CT CHEST WITH CONTRAST 01/15/2024 08:50:00 AM TECHNIQUE: CT of the chest was performed with the administration of 60 mL of iohexol  (OMNIPAQUE ) 300 MG/ML solution. Multiplanar reformatted images are provided for review. Automated exposure control, iterative reconstruction, and/or weight based adjustment of the mA/kV was utilized to reduce the radiation dose to as low as reasonably achievable. COMPARISON: 10/16/2023 CLINICAL HISTORY: Non-small cell lung cancer (NSCLC), metastatic, assess treatment response. * Tracking Code: BO * FINDINGS: MEDIASTINUM: Left port-a-cath tip at the cavoatrial junction. Atherosclerosis and atheromatous vascular calcifications of the thoracic aorta and branch vessels. Incidental fluid density in the superior pericardial recess. The central airways are clear. Circumferential distal esophageal wall thickening is nonspecific although esophagitis would be a common cause. LYMPH NODES: Right hilar node reduced in size, 1.1 cm in short axis on image 51 series 301 and previously 1.7 cm. AP window lymph node 0.5 cm in short axis on image 45 series 301, previously 0.9 cm. Similar reduction in subcarinal lymphadenopathy. No axillary lymphadenopathy. LUNGS AND PLEURA: Emphysema. Right apical pleuroparenchymal scarring appears stable. Reduced interstitial accentuation compared to previous. Cluster nodularity posteriorly in the superior segment right lower lobe on image 53 series 302, favored inflammatory etiology such as atypical infectious bronchiolitis given the morphology, surveillance suggested. Stable 3 mm right middle lobe nodule on image 66 series 302. Reduced size of a 6 mm right lower lobe nodule on image 88 series 302, previously 8 mm. Mildly reduced size of left lower lobe nodules including a 5 mm left upper lobe nodule on image 77 series 302, previously 6 mm. Other small scattered pulmonary nodules appear stable to mildly reduced in general.  However, there is a new left upper lobe nodule measuring 0.8 cm on image 40 of series 302 which has slightly hazy margins and could be inflammatory, but which merits surveillance. Left lower lobectomy. No focal consolidation or pulmonary edema. No pleural effusion or pneumothorax. SOFT TISSUES/BONES: Presumed Schmorl's node on the inferior endplate of L1. No acute abnormality of the bones or soft tissues. UPPER ABDOMEN: Heterogeneous arterial phase enhancement in the upper liver including some nonspecific arterial phase nodularity such as a 1.0 cm focus of arterial phase nodularity thought to be on image 99 of series 301 as well as other small geographic and nodular regions of arterial phase enhancement could be from the early arterial phase of contrast and vascular shunting versus small arterial enhancing primary or metastatic lesions of the liver. These were not apparent on 10/16/2023. Dedicated hepatic protocol MRI with and without contrast could be utilized for further characterization. Abdominal aortic atherosclerosis noted. IMPRESSION: 1. Cluster nodularity posteriorly in the superior segment right lower lobe, favored inflammatory etiology such as atypical infectious bronchiolitis given the morphology, surveillance suggested. 2. New left upper lobe nodule measuring 0.8 cm with slightly hazy margins, possibly inflammatory, merits surveillance. 3. Reduced size of a 6 mm right lower lobe nodule, previously 8 mm. 4. Mildly reduced size of left lower lobe nodules including a 5  mm left upper lobe nodule, previously 6 mm. 5. Other small scattered pulmonary nodules appear stable to mildly reduced in general. 6. Heterogeneous arterial phase enhancement in the upper liver, including a 1.0 cm focus of arterial phase nodularity and other small geographic and nodular regions of arterial phase enhancement, which may reflect early arterial phase contrast/vascular shunting versus arterial enhancing primary or metastatic  hepatic lesions; dedicated hepatic protocol MRI with and without contrast is recommended. 7. Reduced interstitial accentuation compared to prior study. 8. Emphysema and right apical pleuroparenchymal scarring, chronic. 9. Left lower lobectomy, chronic postoperative change. 10. Left port-a-cath with tip at the cavoatrial junction. 11. Atherosclerotic and atheromatous vascular calcifications of the thoracic aorta and branch vessels and abdominal aortic atherosclerosis. 12. Incidental small right hilar and subcarinal lymph nodes decreased in size compared with prior imaging, with right hilar node measuring 1.1 cm short axis and AP window node 0.5 cm short axis. 13. Several additional chronic and incidental findings are present, including scarring, degenerative spinal changes, and benign-appearing small pulmonary nodules. 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 Report date is 12/12/2020.     The Multi-Cancer + RNA Panel offered by [facility] includes sequencing and/or deletion/duplication analysis of the following 84 genes:  AIP*, ALK, APC*, ATM*, AXIN2*, BAP1*, BARD1*, BLM*, BMPR1A*, BRCA1*, BRCA2*, BRIP1*, CASR, CDC73*, CDH1*, CDK4, CDKN1B*, CDKN1C*, CDKN2A, CEBPA, CHEK2*, CTNNA1*, DICER1*, DIS3L2*, EGFR, EPCAM, FH   GERD (gastroesophageal reflux disease)    Gilbert's disease 07/22/2023   Counseled on dx 6/24      Heartburn 12/27/2021   Hormone disorder 11/20/2020   Hyperbilirubinemia 09/26/2022   Hypertension    Hypokalemia 12/27/2021   Hypothyroidism due to medication 01/22/2022   Leukocytosis 01/17/2022   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) 02/11/2023   Multiple bilateral pulmonary nodules up to 7 mm in diameter, increased in size and number from prior study of August 2024     Migraines    Nodule of left lung 11/21/2020   Non-small cell lung cancer, left (HCC) 01/15/2021   Port-A-Cath in place 09/05/2022   Tobacco use 12/07/2020    Past Surgical History:  Procedure Laterality Date   ABDOMINAL HYSTERECTOMY  1989   unsure of vaginal or abdominal   BRONCHIAL BIOPSY  01/15/2021   Procedure: BRONCHIAL BIOPSIES;  Surgeon: Shelah Lamar RAMAN, MD;  Location: [facility] ENDOSCOPY;  Service: Pulmonary;;   BRONCHIAL BRUSHINGS  01/15/2021   Procedure: BRONCHIAL BRUSHINGS;  Surgeon: Shelah Lamar RAMAN, MD;  Location: [facility] ENDOSCOPY;  Service: Pulmonary;;   BRONCHIAL NEEDLE ASPIRATION BIOPSY  01/15/2021   Procedure: BRONCHIAL NEEDLE ASPIRATION BIOPSIES;  Surgeon: Shelah Lamar RAMAN, MD;  Location: [facility] ENDOSCOPY;  Service: Pulmonary;;   COLONOSCOPY  2022   FIDUCIAL MARKER PLACEMENT  01/15/2021   Procedure: FIDUCIAL MARKER PLACEMENT;  Surgeon: Shelah Lamar RAMAN, MD;  Location: [facility] ENDOSCOPY;  Service: Pulmonary;;   ovaries removed  1986   surgery to remove scar tissue     from colon/bladder in patient's late 30's   TUBAL LIGATION  1986   UPPER GI ENDOSCOPY     years ago in patient's late 68s   VIDEO BRONCHOSCOPY WITH RADIAL ENDOBRONCHIAL ULTRASOUND  01/15/2021   Procedure: VIDEO BRONCHOSCOPY WITH RADIAL ENDOBRONCHIAL ULTRASOUND;  Surgeon: Shelah Lamar RAMAN, MD;  Location: [facility] ENDOSCOPY;  Service: Pulmonary;;    Family History  Problem Relation Age of Onset   Pancreatic cancer Maternal Aunt 40   Brain cancer Maternal Uncle    Stomach cancer Maternal Uncle        dx. >50   Prostate cancer Half-Brother         passed away at 63   Prostate cancer Half-Brother     Social History:  reports that she quit smoking about 2 months ago. Her smoking use included cigarettes. She started smoking about 63 years ago. She has a 30.3 pack-year smoking history. She has never used smokeless tobacco. She reports current alcohol use of about 4.0 standard drinks of alcohol per week. She reports current drug use. Frequency: 3.00 times per week. Drug: Marijuana.The patient is alone today.  Allergies:  Allergies  Allergen Reactions   Codeine Rash   Demerol [Meperidine Hcl] Other (See Comments)    coma   Meperidine Other (See Comments)    Several days will pass without patient being aware  Several days will pass without patient being aware    coma   Other Dermatitis    Stainless steel / skin weeps   Silver Dermatitis    Sterling silver skin weeps   Gold Bond [Menthol-Zinc Oxide] Dermatitis    Skin weeps   Neosporin [Bacitracin-Polymyxin B] Dermatitis    Skin weeps   Wound Dressing Adhesive Other (See Comments)    CAUSES SKIN TEARS PER PATIENT. INSTEAD USE THE TEGADERM 62M WITH ADHESIVE FREE WINDOW OVER PORT.   Wound Dressings Other (See Comments)    CAUSES SKIN TEARS PER PATIENT. INSTEAD USE THE TEGADERM 62M WITH ADHESIVE FREE WINDOW OVER PORT.    Current Medications: Current Outpatient Medications  Medication Sig Dispense Refill   amLODipine (NORVASC) 2.5 MG tablet Take 2.5 mg by mouth daily.     aspirin 81 MG EC tablet Take 81 mg by mouth daily.     atorvastatin (LIPITOR) 40 MG tablet Take 40 mg by mouth daily.     benzonatate (TESSALON) 200 MG capsule Take 200 mg by mouth 3 (three) times daily as needed.     celecoxib (CELEBREX) 200 MG capsule Take 200 mg by mouth daily.     HYDROcodone -acetaminophen  (NORCO/VICODIN) 5-325 MG tablet Take 1 tablet by mouth every 6 (six) hours as needed for moderate pain (pain score 4-6) or severe pain (pain score 7-10). 60 tablet 0   levothyroxine  (SYNTHROID ) 100 MCG tablet  Take 1 tablet (100 mcg total) by mouth daily before breakfast. 30 tablet 5   lidocaine -prilocaine  (EMLA ) cream Apply 1 Application topically as needed. 30 g 0   LORazepam  (ATIVAN ) 1 MG tablet Take 1 tablet (1 mg total) by mouth every 8 (eight) hours. Use 1-2 tablets by mouth prior to MRI scan 30 tablet 0   ondansetron  (ZOFRAN ) 4 MG tablet Take 1 tablet (4 mg total) by mouth every 4 (four) hours as needed for nausea. 90 tablet 3   pantoprazole  (PROTONIX ) 40 MG tablet TAKE 1 TABLET(40 MG) BY MOUTH DAILY 180 tablet 0   Prenatal Vit-Fe Fumarate-FA (PRENATAL VITAMIN) 27-0.8  MG TABS Take 1 tablet by mouth daily. 30 tablet 5   prochlorperazine  (COMPAZINE ) 10 MG tablet TAKE 1 TABLET(10 MG) BY MOUTH EVERY 6 HOURS AS NEEDED FOR NAUSEA OR VOMITING 360 tablet 0   temazepam (RESTORIL) 15 MG capsule Take 15 mg by mouth at bedtime as needed.     TRELEGY ELLIPTA 200-62.5-25 MCG/ACT AEPB Take 1 puff by mouth daily.     VENTOLIN  HFA 108 (90 Base) MCG/ACT inhaler Inhale 1-2 puffs into the lungs as needed for wheezing or shortness of breath.     No current facility-administered medications for this visit.   Facility-Administered Medications Ordered in Other Visits  Medication Dose Route Frequency Provider Last Rate Last Admin   sodium chloride  flush (NS) 0.9 % injection 10 mL  10 mL Intracatheter PRN Cornelius Wanda DEL, MD   10 mL at 08/21/23 1042    I,Jasmine M Lassiter,acting as a neurosurgeon for Wanda DEL Cornelius, MD.,have documented all relevant documentation on the behalf of Wanda DEL Cornelius, MD,as directed by  Wanda DEL Cornelius, MD while in the presence of Wanda DEL Cornelius, MD.    "

## 2024-01-21 ENCOUNTER — Other Ambulatory Visit: Payer: Self-pay

## 2024-01-26 ENCOUNTER — Inpatient Hospital Stay

## 2024-01-26 VITALS — BP 116/71 | HR 77 | Temp 98.6°F | Resp 18 | Ht 59.0 in | Wt 95.0 lb

## 2024-01-26 DIAGNOSIS — C3492 Malignant neoplasm of unspecified part of left bronchus or lung: Secondary | ICD-10-CM

## 2024-01-26 DIAGNOSIS — Z5112 Encounter for antineoplastic immunotherapy: Secondary | ICD-10-CM | POA: Diagnosis not present

## 2024-01-26 MED ORDER — SODIUM CHLORIDE 0.9 % IV SOLN
200.0000 mg | Freq: Once | INTRAVENOUS | Status: AC
  Administered 2024-01-26: 200 mg via INTRAVENOUS
  Filled 2024-01-26: qty 8

## 2024-01-26 MED ORDER — SODIUM CHLORIDE 0.9 % IV SOLN: INTRAVENOUS | Status: DC

## 2024-01-26 NOTE — Patient Instructions (Signed)

## 2024-01-29 ENCOUNTER — Encounter: Payer: Self-pay | Admitting: Oncology

## 2024-02-02 ENCOUNTER — Inpatient Hospital Stay

## 2024-02-12 ENCOUNTER — Inpatient Hospital Stay: Attending: Hematology and Oncology

## 2024-02-12 ENCOUNTER — Inpatient Hospital Stay: Admitting: Oncology

## 2024-02-12 ENCOUNTER — Encounter: Payer: Self-pay | Admitting: Oncology

## 2024-02-12 ENCOUNTER — Other Ambulatory Visit: Payer: Self-pay | Admitting: Oncology

## 2024-02-12 VITALS — BP 139/84 | HR 69 | Temp 98.1°F | Resp 18 | Ht 59.0 in | Wt 93.5 lb

## 2024-02-12 DIAGNOSIS — Z90722 Acquired absence of ovaries, bilateral: Secondary | ICD-10-CM | POA: Diagnosis not present

## 2024-02-12 DIAGNOSIS — I700 Atherosclerosis of aorta: Secondary | ICD-10-CM | POA: Diagnosis not present

## 2024-02-12 DIAGNOSIS — Z90710 Acquired absence of both cervix and uterus: Secondary | ICD-10-CM | POA: Diagnosis not present

## 2024-02-12 DIAGNOSIS — K769 Liver disease, unspecified: Secondary | ICD-10-CM | POA: Diagnosis not present

## 2024-02-12 DIAGNOSIS — C3492 Malignant neoplasm of unspecified part of left bronchus or lung: Secondary | ICD-10-CM

## 2024-02-12 DIAGNOSIS — Z79899 Other long term (current) drug therapy: Secondary | ICD-10-CM | POA: Insufficient documentation

## 2024-02-12 DIAGNOSIS — Z79890 Hormone replacement therapy (postmenopausal): Secondary | ICD-10-CM | POA: Diagnosis not present

## 2024-02-12 DIAGNOSIS — Z808 Family history of malignant neoplasm of other organs or systems: Secondary | ICD-10-CM | POA: Diagnosis not present

## 2024-02-12 DIAGNOSIS — Z7982 Long term (current) use of aspirin: Secondary | ICD-10-CM | POA: Insufficient documentation

## 2024-02-12 DIAGNOSIS — F17210 Nicotine dependence, cigarettes, uncomplicated: Secondary | ICD-10-CM | POA: Insufficient documentation

## 2024-02-12 DIAGNOSIS — E032 Hypothyroidism due to medicaments and other exogenous substances: Secondary | ICD-10-CM | POA: Diagnosis not present

## 2024-02-12 DIAGNOSIS — J90 Pleural effusion, not elsewhere classified: Secondary | ICD-10-CM | POA: Insufficient documentation

## 2024-02-12 DIAGNOSIS — Z881 Allergy status to other antibiotic agents status: Secondary | ICD-10-CM | POA: Insufficient documentation

## 2024-02-12 DIAGNOSIS — C7802 Secondary malignant neoplasm of left lung: Secondary | ICD-10-CM

## 2024-02-12 DIAGNOSIS — Z8042 Family history of malignant neoplasm of prostate: Secondary | ICD-10-CM | POA: Diagnosis not present

## 2024-02-12 DIAGNOSIS — C3432 Malignant neoplasm of lower lobe, left bronchus or lung: Secondary | ICD-10-CM | POA: Insufficient documentation

## 2024-02-12 DIAGNOSIS — Z885 Allergy status to narcotic agent status: Secondary | ICD-10-CM | POA: Insufficient documentation

## 2024-02-12 DIAGNOSIS — F1290 Cannabis use, unspecified, uncomplicated: Secondary | ICD-10-CM | POA: Diagnosis not present

## 2024-02-12 DIAGNOSIS — J439 Emphysema, unspecified: Secondary | ICD-10-CM | POA: Diagnosis not present

## 2024-02-12 DIAGNOSIS — Z800 Family history of malignant neoplasm of digestive organs: Secondary | ICD-10-CM | POA: Insufficient documentation

## 2024-02-12 DIAGNOSIS — I10 Essential (primary) hypertension: Secondary | ICD-10-CM | POA: Insufficient documentation

## 2024-02-12 DIAGNOSIS — C7801 Secondary malignant neoplasm of right lung: Secondary | ICD-10-CM

## 2024-02-12 DIAGNOSIS — Z5112 Encounter for antineoplastic immunotherapy: Secondary | ICD-10-CM | POA: Diagnosis present

## 2024-02-12 LAB — CBC WITH DIFFERENTIAL (CANCER CENTER ONLY)
Abs Immature Granulocytes: 0.05 K/uL (ref 0.00–0.07)
Basophils Absolute: 0.2 K/uL — ABNORMAL HIGH (ref 0.0–0.1)
Basophils Relative: 2 %
Eosinophils Absolute: 1.5 K/uL — ABNORMAL HIGH (ref 0.0–0.5)
Eosinophils Relative: 13 %
HCT: 43.2 % (ref 36.0–46.0)
Hemoglobin: 14.5 g/dL (ref 12.0–15.0)
Immature Granulocytes: 0 %
Lymphocytes Relative: 23 %
Lymphs Abs: 2.7 K/uL (ref 0.7–4.0)
MCH: 32.5 pg (ref 26.0–34.0)
MCHC: 33.6 g/dL (ref 30.0–36.0)
MCV: 96.9 fL (ref 80.0–100.0)
Monocytes Absolute: 0.9 K/uL (ref 0.1–1.0)
Monocytes Relative: 8 %
Neutro Abs: 6.4 K/uL (ref 1.7–7.7)
Neutrophils Relative %: 54 %
Platelet Count: 330 K/uL (ref 150–400)
RBC: 4.46 MIL/uL (ref 3.87–5.11)
RDW: 14 % (ref 11.5–15.5)
WBC Count: 11.7 K/uL — ABNORMAL HIGH (ref 4.0–10.5)
nRBC: 0 % (ref 0.0–0.2)

## 2024-02-12 LAB — CMP (CANCER CENTER ONLY)
ALT: 17 U/L (ref 0–44)
AST: 19 U/L (ref 15–41)
Albumin: 4.5 g/dL (ref 3.5–5.0)
Alkaline Phosphatase: 76 U/L (ref 38–126)
Anion gap: 10 (ref 5–15)
BUN: 15 mg/dL (ref 8–23)
CO2: 27 mmol/L (ref 22–32)
Calcium: 10.3 mg/dL (ref 8.9–10.3)
Chloride: 102 mmol/L (ref 98–111)
Creatinine: 0.7 mg/dL (ref 0.44–1.00)
GFR, Estimated: >60 mL/min
Glucose, Bld: 94 mg/dL (ref 70–99)
Potassium: 4.1 mmol/L (ref 3.5–5.1)
Sodium: 139 mmol/L (ref 135–145)
Total Bilirubin: 0.8 mg/dL (ref 0.0–1.2)
Total Protein: 7.4 g/dL (ref 6.5–8.1)

## 2024-02-12 LAB — TSH: TSH: 51.2 u[IU]/mL — ABNORMAL HIGH (ref 0.350–4.500)

## 2024-02-12 NOTE — Progress Notes (Signed)
 " [facility]  [address] [place],  [place]  [address] [phone]  Clinic Day: 02/12/2024  Referring physician: Jama Chow, MD  ASSESSMENT & PLAN:  Assessment: Non-small cell lung cancer, left (HCC) Stage IIB adenocarcinoma of the left lung diagnosed in December 2022.  She was treated with left lower lobectomy in [place] .  Her doctor there recommended chemotherapy with immunotherapy, but she refused chemotherapy.  PD-L1 was positive.  She was only willing to take the immunotherapy for 1 year. She started pembrolizumab  in August 2023.  We saw her in October 2023, when she returned to [place]  and continued pembrolizumab  every 21 days. Routine CT imaging had not shown any evidence of recurrence.  She completed 17 cycles of adjuvant pembrolizumab  in August and CT chest following that was negative for recurrence. There were stable scattered small bilateral pulmonary nodules, felt to be from benign emphysema.  Surgical changes were seen in the left lower lobe.    Unfortunately, she had evidence of recurrence on CT chest in December 2024 with interval progression in the size and number of multiple bilateral pulmonary nodules most consistent with progression of metastatic disease. We decided not to pursue a biopsy in view of the difficulty involved. She was placed back on pembrolizumab . CT imaging in June revealed very mild progression with some increased mediastinal and bilateral hilar lymphadenopathy. There were multiple bilateral pulmonary nodules measuring up to 8 mm in size, so just mildly progressive. There was a small left pleural effusion with mild pleural thickening. After discussion with the patient, we opted to continue immunotherapy due to the extremely slow progression of her disease.  She wishes to avoid IV chemotherapy if possible.  She understands chemotherapy probably will be necessary in the future. Repeat CT imaging in September 2025 revealed stable disease.     CT  chest done on 01/15/2024 revealed cluster nodularity posteriorly in the superior segment right lower lobe that is favored inflammatory etiology, a new left upper lobe nodule measuring 0.8 cm with slightly hazy margins, reduced size of a 6 mm right lower lobe nodule that was previously 8 mm, and a mildly reduced size of left lower lobe nodules including a 5 mm left upper lobe nodule that was previously 6mm. Other small scattered pulmonary nodules appear stable to mildly reduced in general. Reduced interstitial accentuation compared to her prior study, chronic emphysema and right apical pleuroparenchymal scarring, left lower lobectomy has chronic postoperative change. Her incidental small right hilar and subcarinal lymph nodes have decreased in size compared with prior imaging, with the right hilar node measuring 1.1cm short axis and AP window node 0.5 cm.  So she is clearly responding to immunotherapy, and we will continue to monitor..      Malignant neoplasm metastatic to lung [facility]) CT chest in December 2024 revealed multiple bilateral pulmonary nodules which increased in size and number since imaging in August 2024.  These measure up to 7 mm in diameter.  There was an area of pleural plaque in the posteriomedial left upper lobe, measuring 2.9 cm.  Prior PD-L1 was positive at 90%, so she resumed pembrolizumab  in January 2025 and we continue that through January 2026.   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  Once again it is higher than previous.  We will proceed with pembrolizumab  and continue to monitor this.   Hypothyroidism due to medication The TSH  has fluctuated up and down.  The TSH was back to 11.6 in October, so we recommended increasing the levothyroxine  to 100 mcg daily.  TSH is pending from today.  Plan: She had a CT chest with contrast on 01/15/2024 which revealed cluster nodularity  posteriorly in the superior segment right lower lobe, favored inflammatory etiology such as atypical infectious bronchiolitis given the morphology, surveillance suggested, a new left upper lobe nodule measuring 0.8 cm with slightly hazy margins, possibly inflammatory, reduced size of a 6 mm right lower lobe nodule, previously 8 mm, mildly reduced size of left lower lobe nodules including a 5 mm left upper lobe nodule, previously 6 mm, other small scattered pulmonary nodules appear stable to mildly reduced in general, heterogeneous arterial phase enhancement in the upper liver, including a 1.0cm focus of arterial phase nodularity and other small geographic and nodular regions of arterial phase enhancement, reduced interstitial accentuation compared to prior study. So she is clearly responding to immunotherapy, and we will continue to monitor.  Her day 1, cycle 18 of pembrolizumab  is scheduled for 02/16/2024. She has an elevated WBC of 11.7 with an ANC of 6400 up from 10.3 with an ANC of 5200, hemoglobin of 14.5, and platelet count of 330,000. Her CMP is completely normal. Her TSH and T4 are pending. She denies fever and signs of infections. I will see her back in 3 weeks with CBC, CMP, TSH, and T4. She will have treatment on 03/09/2023, the following Monday.  Dr. Jama will refer her for colonoscopy to Dr. Velinda Fanti. The patient understands the plans discussed today and is in agreement with them.  She knows to contact our office if she develops concerns prior to her next appointment.   I provided 9 minutes of face-to-face time during this encounter and > 50% was spent counseling as documented under my assessment and plan.   [name], MD  [facility] OF [facility] [address] [place] [address] Dept: [phone] Dept Fax: [phone]   No orders of the defined types were placed in this encounter.   CHIEF COMPLAINT:   CC: Recurrent non-small cell lung cancer  Current Treatment: Pembrolizumab  every 3 weeks  HISTORY OF PRESENT ILLNESS:   Oncology History  Non-small cell lung cancer, left (HCC)  01/15/2021 Initial Diagnosis   Non-small cell lung cancer, left (HCC)   02/12/2021 Cancer Staging   Staging form: Lung, AJCC 8th Edition - Clinical stage from 02/12/2021: Stage IA2 (cT1b, cN0, cM0) - Signed by Cornish Wanda VEAR, MD on 02/14/2021 Histopathologic type: Adenocarcinoma, NOS Stage prefix: Initial diagnosis Histologic grade (G): GX Histologic grading system: 4 grade system Laterality: Left Tumor size (mm): 19 Lymph-vascular invasion (LVI): LVI not present (absent)/not identified Diagnostic confirmation: Positive histology Specimen type: Bronchial Biopsy Staged by: Managing physician Stage used in treatment planning: Yes National guidelines used in treatment planning: Yes Type of national guideline used in treatment planning: NCCN Staging comments: Rec surgical resection   05/11/2021 Cancer Staging   Staging form: Lung, AJCC 8th Edition - Pathologic stage from 05/11/2021: Stage IIB (ypT1c, pN1, cM0) - Signed by Cornish Wanda VEAR, MD on 11/12/2021 Histopathologic type: Adenocarcinoma, NOS Stage prefix: Post-therapy Histologic grade (G): G2 Histologic grading system: 4 grade system Residual tumor (R): R0 - None Laterality: Left Tumor size (mm): 25 Lymph-vascular invasion (LVI): LVI not present (absent)/not identified Diagnostic confirmation: Positive histology PLUS positive immunophenotyping and/or positive genetic studies Specimen type:  Excision Staged by: Managing physician Type of lung cancer: Surgically resected non-small cell lung cancer ECOG performance status: Grade 1 Perineural invasion (PNI): Absent Pleural/elastic layer invasion: PL0 Pleural lavage cytology: Unknown Weight loss: Absent Adequacy of mediastinal dissection: Adequate Radiotherapy dose: No Adjuvant radiation:  No Adjuvant chemotherapy: Yes Stage used in treatment planning: Yes National guidelines used in treatment planning: Yes Type of national guideline used in treatment planning: NCCN Staging comments: Adjuvant pembrolizumab , PDL 1 90%   10/04/2021 - 10/01/2022 Chemotherapy   Patient is on Treatment Plan : LUNG NSCLC Pembrolizumab  Adj q21d x 18 cycles     02/04/2023 -  Chemotherapy   Patient is on Treatment Plan : LUNG NSCLC Pembrolizumab  (200) q21d       INTERVAL HISTORY:  Zulema is here today for repeat clinical assessment for her recurrent non-small cell lung cancer. Patient states that she feels good and has no complaints of pain. She had a CT chest with contrast on 01/15/2024 which revealed cluster nodularity posteriorly in the superior segment right lower lobe, favored inflammatory etiology such as atypical infectious bronchiolitis given the morphology, surveillance suggested, a new left upper lobe nodule measuring 0.8 cm with slightly hazy margins, possibly inflammatory, reduced size of a 6 mm right lower lobe nodule, previously 8 mm, mildly reduced size of left lower lobe nodules including a 5 mm left upper lobe nodule, previously 6 mm, other small scattered pulmonary nodules appear stable to mildly reduced in general, heterogeneous arterial phase enhancement in the upper liver, including a 1.0cm focus of arterial phase nodularity and other small geographic and nodular regions of arterial phase enhancement, reduced interstitial accentuation compared to prior study. So she is clearly responding to immunotherapy, and we will continue to monitor.  Her day 1, cycle 18 of pembrolizumab  is scheduled for 02/16/2024. She has an elevated WBC of 11.7 with an ANC of 6400 up from 10.3 with an ANC of 5200, hemoglobin of 14.5, and platelet count of 330,000. Her CMP is completely normal. Her TSH and T4 are pending. She denies fever and signs of infections. I will see her back in 3 weeks with CBC, CMP, TSH, and T4. She  will have treatment on 03/09/2023, the following Monday.  Dr. Jama will refer her for colonoscopy to Dr. Velinda Fanti. She denies fever, chills, night sweats, or other signs of infection. She denies cardiorespiratory and gastrointestinal issues. She  denies pain. Her appetite is good and Her weight has decreased 2 pounds over last 2.5 weeks.  REVIEW OF SYSTEMS:   Review of Systems  Constitutional: Negative.  Negative for appetite change, chills, fatigue, fever and unexpected weight change.  HENT:  Negative.  Negative for lump/mass, mouth sores and sore throat.   Eyes: Negative.   Respiratory:  Positive for cough (chronic; improved). Negative for chest tightness, hemoptysis, shortness of breath and wheezing.   Cardiovascular: Negative.  Negative for chest pain, leg swelling and palpitations.  Gastrointestinal: Negative.  Negative for abdominal distention, abdominal pain, blood in stool, constipation, diarrhea, nausea and vomiting.  Endocrine: Negative.   Genitourinary: Negative.  Negative for difficulty urinating, dysuria, frequency and hematuria.   Musculoskeletal:  Positive for arthralgias. Negative for back pain, flank pain, gait problem and myalgias.  Skin: Negative.  Negative for rash.  Neurological:  Negative for dizziness, extremity weakness, gait problem, headaches, light-headedness, numbness, seizures and speech difficulty.  Hematological: Negative.  Negative for adenopathy. Does not bruise/bleed easily.  Psychiatric/Behavioral: Negative.  Negative for depression and sleep disturbance. The patient is  not nervous/anxious.     VITALS:   Blood pressure 139/84, pulse 69, temperature 98.1 F (36.7 C), temperature source Oral, resp. rate 18, height 4' 11 (1.499 m), weight 93 lb 8 oz (42.4 kg), SpO2 97%.  Wt Readings from Last 3 Encounters:  02/16/24 93 lb 1.9 oz (42.2 kg)  02/12/24 93 lb 8 oz (42.4 kg)  01/26/24 95 lb 0.6 oz (43.1 kg)    Body mass index is 18.88 kg/m.  Performance  status (ECOG): 1 - Symptomatic but completely ambulatory  PHYSICAL EXAM:   Physical Exam Vitals and nursing note reviewed.  Constitutional:      General: She is not in acute distress.    Appearance: Normal appearance. She is normal weight.  HENT:     Head: Normocephalic and atraumatic.     Right Ear: Tympanic membrane, ear canal and external ear normal. There is no impacted cerumen.     Left Ear: Tympanic membrane, ear canal and external ear normal. There is no impacted cerumen.     Nose: Nose normal. No congestion or rhinorrhea.     Mouth/Throat:     Mouth: Mucous membranes are moist.     Pharynx: Oropharynx is clear. No oropharyngeal exudate or posterior oropharyngeal erythema.  Eyes:     General: No scleral icterus.    Extraocular Movements: Extraocular movements intact.     Conjunctiva/sclera: Conjunctivae normal.     Pupils: Pupils are equal, round, and reactive to light.  Cardiovascular:     Rate and Rhythm: Normal rate and regular rhythm.     Pulses: Normal pulses.     Heart sounds: Normal heart sounds. No murmur heard.    No friction rub. No gallop.  Pulmonary:     Effort: Pulmonary effort is normal. No respiratory distress.     Breath sounds: Normal breath sounds. No wheezing, rhonchi or rales.  Abdominal:     General: Bowel sounds are normal. There is no distension.     Palpations: Abdomen is soft. There is no hepatomegaly, splenomegaly or mass.     Tenderness: There is no abdominal tenderness.  Musculoskeletal:        General: Normal range of motion.     Cervical back: Normal range of motion and neck supple. No tenderness.     Right lower leg: No edema.     Left lower leg: No edema.  Lymphadenopathy:     Cervical: No cervical adenopathy.     Right cervical: No superficial, deep or posterior cervical adenopathy.    Left cervical: No superficial, deep or posterior cervical adenopathy.     Upper Body:     Right upper body: No supraclavicular, axillary or pectoral  adenopathy.     Left upper body: No supraclavicular, axillary or pectoral adenopathy.     Lower Body: No right inguinal adenopathy. No left inguinal adenopathy.  Skin:    General: Skin is warm and dry.     Coloration: Skin is not jaundiced.     Findings: No rash.  Neurological:     General: No focal deficit present.     Mental Status: She is alert and oriented to person, place, and time. Mental status is at baseline.     Cranial Nerves: No cranial nerve deficit.  Psychiatric:        Mood and Affect: Mood normal.        Behavior: Behavior normal.        Thought Content: Thought content normal.  Judgment: Judgment normal.    LABS:      Latest Ref Rng & Units 02/12/2024    3:50 PM 01/20/2024    9:41 AM 12/30/2023    8:26 AM  CBC  WBC 4.0 - 10.5 K/uL 11.7  10.3  13.2   Hemoglobin 12.0 - 15.0 g/dL 85.4  86.6  86.3   Hematocrit 36.0 - 46.0 % 43.2  39.9  41.3   Platelets 150 - 400 K/uL 330  277  335       Latest Ref Rng & Units 02/12/2024    3:50 PM 01/20/2024    9:41 AM 12/30/2023    8:26 AM  CMP  Glucose 70 - 99 mg/dL 94  82  92   BUN 8 - 23 mg/dL 15  21  17    Creatinine 0.44 - 1.00 mg/dL 9.29  9.20  9.22   Sodium 135 - 145 mmol/L 139  142  140   Potassium 3.5 - 5.1 mmol/L 4.1  4.9  4.3   Chloride 98 - 111 mmol/L 102  105  105   CO2 22 - 32 mmol/L 27  29  27    Calcium 8.9 - 10.3 mg/dL 89.6  89.3  9.5   Total Protein 6.5 - 8.1 g/dL 7.4  6.7  6.6   Total Bilirubin 0.0 - 1.2 mg/dL 0.8  1.2  2.0   Alkaline Phos 38 - 126 U/L 76  59  54   AST 15 - 41 U/L 19  18  16    ALT 0 - 44 U/L 17  16  11     Lab Results  Component Value Date   CEA1 5.3 (H) 10/17/2022   CEA 4.2 01/13/2023   /  CEA  Date Value Ref Range Status  01/13/2023 4.2  Final  10/17/2022 5.3 (H) 0.0 - 4.7 ng/mL Final    Comment:    (NOTE)                             Nonsmokers          <3.9                             Smokers             <5.6 [employer] Electrochemiluminescence  Immunoassay (ECLIA) Values obtained with different assay methods or kits cannot be used interchangeably.  Results cannot be interpreted as absolute evidence of the presence or absence of malignant disease. Performed At: [facility] [address] [place], [place] [id number] Jennette Shorter MD Ey:[id number]    Lab Results  Component Value Date   LDH 130 11/28/2022   STUDIES:  EXAM: 01/15/2024 CT CHEST WITH CONTRAST IMPRESSION: 1. Cluster nodularity posteriorly in the superior segment right lower lobe, favored inflammatory etiology such as atypical infectious bronchiolitis given the morphology, surveillance suggested. 2. New left upper lobe nodule measuring 0.8 cm with slightly hazy margins, possibly inflammatory, merits surveillance. 3. Reduced size of a 6 mm right lower lobe nodule, previously 8 mm. 4. Mildly reduced size of left lower lobe nodules including a 5 mm left upper lobe nodule, previously 6 mm. 5. Other small scattered pulmonary nodules appear stable to mildly reduced in general. 6. Heterogeneous arterial phase enhancement in the upper liver, including a 1.0cm focus of arterial phase nodularity and other small geographic and nodular regions of arterial phase enhancement, which may reflect  early arterial phase contrast/vascular shunting versus arterial enhancing primary or metastatic hepatic lesions; dedicated hepatic protocol MRI with and without contrast is recommended. 7. Reduced interstitial accentuation compared to prior study. 8. Emphysema and right apical pleuroparenchymal scarring, chronic. 9. Left lower lobectomy, chronic postoperative change. 10. Left port-a-cath with tip at the cavoatrial junction. 11. Atherosclerotic and atheromatous vascular calcifications of the thoracic aorta and branch vessels and abdominal aortic atherosclerosis. 12. Incidental small right hilar and subcarinal lymph nodes decreased in size compared with prior imaging, with  right hilar node measuring 1.1 cm short axis and AP window node 0.5 cm short axis. 13. Several additional chronic and incidental findings are present, including scarring, degenerative spinal changes, and benign-appearing small pulmonary nodules.  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 Report date is 12/12/2020.     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   Leukocytosis 01/17/2022   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) 02/11/2023   Multiple bilateral pulmonary nodules up to 7 mm in diameter, increased in size and number from prior study of August 2024     Migraines    Nodule of left lung 11/21/2020   Non-small cell lung cancer, left (HCC) 01/15/2021   Port-A-Cath in place 09/05/2022   Tobacco use 12/07/2020    Past Surgical History:  Procedure Laterality Date   ABDOMINAL HYSTERECTOMY  1989   unsure of vaginal or abdominal    BRONCHIAL BIOPSY  01/15/2021   Procedure: BRONCHIAL BIOPSIES;  Surgeon: Shelah Lamar RAMAN, MD;  Location: [facility] ENDOSCOPY;  Service: Pulmonary;;   BRONCHIAL BRUSHINGS  01/15/2021   Procedure: BRONCHIAL BRUSHINGS;  Surgeon: Shelah Lamar RAMAN, MD;  Location: [facility] ENDOSCOPY;  Service: Pulmonary;;   BRONCHIAL NEEDLE ASPIRATION BIOPSY  01/15/2021   Procedure: BRONCHIAL NEEDLE ASPIRATION BIOPSIES;  Surgeon: Shelah Lamar RAMAN, MD;  Location: [facility] ENDOSCOPY;  Service: Pulmonary;;   COLONOSCOPY  2022   FIDUCIAL MARKER PLACEMENT  01/15/2021   Procedure: FIDUCIAL MARKER PLACEMENT;  Surgeon: Shelah Lamar RAMAN, MD;  Location: [facility] ENDOSCOPY;  Service: Pulmonary;;   ovaries removed  1986   surgery to remove scar tissue     from colon/bladder in patient's late 30's   TUBAL LIGATION  1986   UPPER GI ENDOSCOPY     years ago in patient's late 58s   VIDEO BRONCHOSCOPY WITH RADIAL ENDOBRONCHIAL ULTRASOUND  01/15/2021   Procedure: VIDEO BRONCHOSCOPY WITH RADIAL ENDOBRONCHIAL ULTRASOUND;  Surgeon: Shelah Lamar RAMAN, MD;  Location: [facility] ENDOSCOPY;  Service: Pulmonary;;    Family History  Problem Relation Age of Onset   Pancreatic cancer Maternal Aunt 48   Brain cancer Maternal Uncle    Stomach cancer Maternal Uncle        dx. >50   Prostate cancer Half-Brother        passed away at 51   Prostate cancer Half-Brother     Social History:  reports that she has been smoking cigarettes. She started smoking about 63 years ago. She has a 30.4 pack-year smoking history. She has never used smokeless tobacco. She reports current alcohol use of about 4.0 standard drinks of alcohol per week. She reports current drug use. Frequency: 3.00 times per week. Drug: Marijuana.The patient is alone today.  Allergies:  Allergies  Allergen Reactions   Codeine Rash   Demerol [Meperidine Hcl] Other (See Comments)    coma   Meperidine Other (See Comments)    Several days will pass without patient being aware  Several days will pass without  patient being aware    coma   Other Dermatitis    Stainless steel / skin weeps   Silver Dermatitis    Sterling silver skin weeps   Gold Bond [Menthol-Zinc Oxide] Dermatitis    Skin weeps   Neosporin [Bacitracin-Polymyxin B] Dermatitis    Skin weeps   Wound Dressing Adhesive Other (See Comments)    CAUSES SKIN TEARS PER PATIENT. INSTEAD USE THE TEGADERM 273M WITH ADHESIVE FREE WINDOW OVER PORT.   Wound Dressings Other (See Comments)    CAUSES SKIN TEARS PER PATIENT. INSTEAD USE THE TEGADERM 273M WITH ADHESIVE FREE WINDOW OVER PORT.    Current Medications: Current Outpatient Medications  Medication Sig Dispense Refill   amLODipine (NORVASC) 2.5 MG tablet Take 2.5 mg by mouth daily.     aspirin 81 MG EC tablet Take 81 mg by mouth daily.     atorvastatin (LIPITOR) 40 MG tablet Take 40 mg by mouth daily.     benzonatate (TESSALON) 200 MG capsule Take 200 mg by mouth 3 (three) times daily as needed.     celecoxib (CELEBREX) 200 MG capsule Take 200 mg by mouth daily.     HYDROcodone -acetaminophen  (NORCO/VICODIN) 5-325 MG tablet Take 1 tablet by mouth every 6 (six) hours as needed for moderate pain (pain score 4-6) or severe pain (pain score 7-10). 60 tablet 0   levothyroxine  (SYNTHROID ) 150 MCG tablet Take 1 tablet (150 mcg total) by mouth daily before breakfast. 30 tablet 5   lidocaine -prilocaine  (EMLA ) cream Apply 1 Application topically as needed. 30 g 0   LORazepam  (ATIVAN ) 1 MG tablet Take 1 tablet (1 mg total) by mouth every 8 (eight) hours. Use 1-2 tablets by mouth prior to MRI scan 30 tablet 0   ondansetron  (ZOFRAN ) 4 MG tablet Take 1 tablet (4 mg total) by mouth every 4 (four) hours as needed for nausea. 90 tablet 3   pantoprazole  (PROTONIX ) 40 MG tablet TAKE 1 TABLET(40 MG) BY MOUTH DAILY 180 tablet 0   Prenatal Vit-Fe Fumarate-FA (PRENATAL VITAMIN) 27-0.8 MG TABS Take 1 tablet by mouth daily. 30 tablet 5   prochlorperazine  (COMPAZINE ) 10 MG tablet TAKE 1 TABLET(10 MG) BY MOUTH EVERY 6  HOURS AS NEEDED FOR NAUSEA OR VOMITING 360 tablet 0   temazepam (RESTORIL) 30 MG capsule Take 30 mg by mouth at bedtime as needed.     TRELEGY ELLIPTA 200-62.5-25 MCG/ACT AEPB Take 1 puff by mouth daily.     VENTOLIN  HFA 108 (90 Base)  MCG/ACT inhaler Inhale 1-2 puffs into the lungs as needed for wheezing or shortness of breath.     No current facility-administered medications for this visit.   Facility-Administered Medications Ordered in Other Visits  Medication Dose Route Frequency Provider Last Rate Last Admin   sodium chloride  flush (NS) 0.9 % injection 10 mL  10 mL Intracatheter PRN Cornelius Wanda DEL, MD   10 mL at 08/21/23 1042   I, Aretta Cook, acting as a neurosurgeon for Wanda DEL Cornelius, MD, have documented all relevant documentation on the behalf of Wanda DEL Cornelius, MD, as directed by Wanda DEL Cornelius, MD, while in the presence of Wanda DEL Cornelius, MD.  I have reviewed this report as typed by the medical scribe, and it is complete and accurate.  "

## 2024-02-13 ENCOUNTER — Other Ambulatory Visit: Payer: Self-pay

## 2024-02-13 LAB — T4: T4, Total: 6.6 ug/dL (ref 4.5–12.0)

## 2024-02-16 ENCOUNTER — Inpatient Hospital Stay

## 2024-02-16 ENCOUNTER — Other Ambulatory Visit: Payer: Self-pay | Admitting: Oncology

## 2024-02-16 VITALS — BP 120/75 | HR 72 | Temp 97.7°F | Resp 20 | Ht 59.0 in | Wt 93.1 lb

## 2024-02-16 DIAGNOSIS — E032 Hypothyroidism due to medicaments and other exogenous substances: Secondary | ICD-10-CM

## 2024-02-16 DIAGNOSIS — C3492 Malignant neoplasm of unspecified part of left bronchus or lung: Secondary | ICD-10-CM

## 2024-02-16 DIAGNOSIS — Z5112 Encounter for antineoplastic immunotherapy: Secondary | ICD-10-CM | POA: Diagnosis not present

## 2024-02-16 MED ORDER — LEVOTHYROXINE SODIUM 150 MCG PO TABS: 150.0000 ug | ORAL_TABLET | Freq: Every day | ORAL | 5 refills | Status: AC

## 2024-02-16 MED ORDER — SODIUM CHLORIDE 0.9 % IV SOLN: INTRAVENOUS | Status: DC

## 2024-02-16 MED ORDER — SODIUM CHLORIDE 0.9 % IV SOLN
200.0000 mg | Freq: Once | INTRAVENOUS | Status: AC
  Administered 2024-02-16: 200 mg via INTRAVENOUS
  Filled 2024-02-16: qty 200

## 2024-02-16 NOTE — Patient Instructions (Signed)

## 2024-02-17 ENCOUNTER — Other Ambulatory Visit: Payer: Self-pay

## 2024-02-17 ENCOUNTER — Telehealth: Payer: Self-pay

## 2024-02-17 NOTE — Telephone Encounter (Signed)
-----   Message from Wanda Cornish, MD sent at 02/16/2024  1:42 PM EST ----- Regarding: RE: TSH=51.2 That's quite a jump.  I would like to increase her dose to 150 mcg daily and will send Rx in.  I would also like to check it more often to validate these results ----- Message ----- From: Afton Devere HERO, [facility] Sent: 02/16/2024   9:08 AM EST To: [name], MD Subject: TSH=51.2                                       Pt said that she hasn't missed any doses of levo.

## 2024-02-17 NOTE — Telephone Encounter (Signed)
 Called patient and notified her of message. Also patient will pick up medication at the pharmacy,

## 2024-02-18 ENCOUNTER — Encounter: Payer: Self-pay | Admitting: Oncology

## 2024-03-04 ENCOUNTER — Other Ambulatory Visit: Payer: Self-pay

## 2024-03-04 ENCOUNTER — Inpatient Hospital Stay

## 2024-03-04 ENCOUNTER — Inpatient Hospital Stay: Admitting: Oncology

## 2024-03-04 ENCOUNTER — Inpatient Hospital Stay: Attending: Hematology and Oncology

## 2024-03-04 ENCOUNTER — Encounter: Payer: Self-pay | Admitting: Oncology

## 2024-03-04 ENCOUNTER — Inpatient Hospital Stay: Admitting: Hematology and Oncology

## 2024-03-08 ENCOUNTER — Inpatient Hospital Stay

## 2024-03-11 ENCOUNTER — Inpatient Hospital Stay: Attending: Hematology and Oncology

## 2024-03-11 ENCOUNTER — Inpatient Hospital Stay: Admitting: Hematology and Oncology

## 2024-03-15 ENCOUNTER — Inpatient Hospital Stay
